# Patient Record
Sex: Female | Born: 1935
Health system: Southern US, Community
[De-identification: ages and names within clinical notes are randomized; demographics above are authoritative.]

## PROBLEM LIST (undated history)

## (undated) DIAGNOSIS — I739 Peripheral vascular disease, unspecified: Secondary | ICD-10-CM

## (undated) DIAGNOSIS — C649 Malignant neoplasm of unspecified kidney, except renal pelvis: Secondary | ICD-10-CM

## (undated) DIAGNOSIS — M1990 Unspecified osteoarthritis, unspecified site: Secondary | ICD-10-CM

## (undated) DIAGNOSIS — E119 Type 2 diabetes mellitus without complications: Secondary | ICD-10-CM

## (undated) DIAGNOSIS — I2510 Atherosclerotic heart disease of native coronary artery without angina pectoris: Secondary | ICD-10-CM

## (undated) DIAGNOSIS — F329 Major depressive disorder, single episode, unspecified: Secondary | ICD-10-CM

## (undated) DIAGNOSIS — F419 Anxiety disorder, unspecified: Secondary | ICD-10-CM

## (undated) DIAGNOSIS — I10 Essential (primary) hypertension: Secondary | ICD-10-CM

## (undated) DIAGNOSIS — R7989 Other specified abnormal findings of blood chemistry: Secondary | ICD-10-CM

## (undated) DIAGNOSIS — D649 Anemia, unspecified: Secondary | ICD-10-CM

## (undated) DIAGNOSIS — R569 Unspecified convulsions: Secondary | ICD-10-CM

## (undated) DIAGNOSIS — M542 Cervicalgia: Secondary | ICD-10-CM

## (undated) DIAGNOSIS — G43909 Migraine, unspecified, not intractable, without status migrainosus: Secondary | ICD-10-CM

## (undated) DIAGNOSIS — Z87442 Personal history of urinary calculi: Secondary | ICD-10-CM

## (undated) DIAGNOSIS — E785 Hyperlipidemia, unspecified: Secondary | ICD-10-CM

## (undated) DIAGNOSIS — I639 Cerebral infarction, unspecified: Secondary | ICD-10-CM

## (undated) DIAGNOSIS — F32A Depression, unspecified: Secondary | ICD-10-CM

## (undated) DIAGNOSIS — Z8739 Personal history of other diseases of the musculoskeletal system and connective tissue: Secondary | ICD-10-CM

## (undated) DIAGNOSIS — I779 Disorder of arteries and arterioles, unspecified: Secondary | ICD-10-CM

## (undated) DIAGNOSIS — I214 Non-ST elevation (NSTEMI) myocardial infarction: Secondary | ICD-10-CM

## (undated) HISTORY — DX: Peripheral vascular disease, unspecified: I73.9

## (undated) HISTORY — PX: LUMBAR DISC SURGERY: SHX700

## (undated) HISTORY — PX: BACK SURGERY: SHX140

## (undated) HISTORY — DX: Cervicalgia: M54.2

## (undated) HISTORY — DX: Unspecified convulsions: R56.9

## (undated) HISTORY — PX: CAROTID ENDARTERECTOMY: SUR193

## (undated) HISTORY — PX: URETERAL STENT PLACEMENT: SHX822

## (undated) HISTORY — DX: Disorder of arteries and arterioles, unspecified: I77.9

## (undated) HISTORY — PX: NEPHRECTOMY: SHX65

## (undated) HISTORY — PX: TONSILLECTOMY: SUR1361

## (undated) HISTORY — DX: Unspecified osteoarthritis, unspecified site: M19.90

## (undated) HISTORY — DX: Essential (primary) hypertension: I10

## (undated) HISTORY — DX: Cerebral infarction, unspecified: I63.9

## (undated) HISTORY — DX: Malignant neoplasm of unspecified kidney, except renal pelvis: C64.9

## (undated) HISTORY — DX: Personal history of other diseases of the musculoskeletal system and connective tissue: Z87.39

## (undated) HISTORY — DX: Other specified abnormal findings of blood chemistry: R79.89

## (undated) HISTORY — PX: CATARACT EXTRACTION W/ INTRAOCULAR LENS  IMPLANT, BILATERAL: SHX1307

## (undated) HISTORY — DX: Hyperlipidemia, unspecified: E78.5

## (undated) HISTORY — PX: ABDOMINAL HYSTERECTOMY: SHX81

---

## 1988-12-26 DIAGNOSIS — E119 Type 2 diabetes mellitus without complications: Secondary | ICD-10-CM

## 1988-12-26 HISTORY — DX: Type 2 diabetes mellitus without complications: E11.9

## 1998-06-11 ENCOUNTER — Inpatient Hospital Stay (HOSPITAL_COMMUNITY): Admission: RE | Admit: 1998-06-11 | Discharge: 1998-06-12 | Payer: Self-pay | Admitting: Specialist

## 1998-07-27 ENCOUNTER — Encounter: Admission: RE | Admit: 1998-07-27 | Discharge: 1998-10-25 | Payer: Self-pay | Admitting: Anesthesiology

## 1999-05-18 ENCOUNTER — Inpatient Hospital Stay (HOSPITAL_COMMUNITY): Admission: RE | Admit: 1999-05-18 | Discharge: 1999-05-23 | Payer: Self-pay | Admitting: Urology

## 1999-12-24 ENCOUNTER — Encounter: Admission: RE | Admit: 1999-12-24 | Discharge: 1999-12-24 | Payer: Self-pay | Admitting: Urology

## 1999-12-24 ENCOUNTER — Encounter: Payer: Self-pay | Admitting: Urology

## 1999-12-25 ENCOUNTER — Ambulatory Visit (HOSPITAL_COMMUNITY): Admission: RE | Admit: 1999-12-25 | Discharge: 1999-12-25 | Payer: Self-pay | Admitting: Internal Medicine

## 1999-12-25 ENCOUNTER — Encounter: Payer: Self-pay | Admitting: Internal Medicine

## 1999-12-27 DIAGNOSIS — C649 Malignant neoplasm of unspecified kidney, except renal pelvis: Secondary | ICD-10-CM

## 1999-12-27 HISTORY — DX: Malignant neoplasm of unspecified kidney, except renal pelvis: C64.9

## 2000-03-24 ENCOUNTER — Encounter: Admission: RE | Admit: 2000-03-24 | Discharge: 2000-03-24 | Payer: Self-pay | Admitting: Urology

## 2000-03-24 ENCOUNTER — Encounter: Payer: Self-pay | Admitting: Urology

## 2000-06-23 ENCOUNTER — Encounter: Admission: RE | Admit: 2000-06-23 | Discharge: 2000-06-23 | Payer: Self-pay | Admitting: Urology

## 2000-06-23 ENCOUNTER — Encounter: Payer: Self-pay | Admitting: Urology

## 2000-10-13 ENCOUNTER — Encounter: Admission: RE | Admit: 2000-10-13 | Discharge: 2000-10-13 | Payer: Self-pay | Admitting: Urology

## 2000-10-13 ENCOUNTER — Encounter: Payer: Self-pay | Admitting: Urology

## 2002-12-26 HISTORY — PX: OTHER SURGICAL HISTORY: SHX169

## 2003-01-17 ENCOUNTER — Encounter: Payer: Self-pay | Admitting: Urology

## 2003-01-17 ENCOUNTER — Encounter: Admission: RE | Admit: 2003-01-17 | Discharge: 2003-01-17 | Payer: Self-pay | Admitting: Urology

## 2003-03-10 ENCOUNTER — Ambulatory Visit (HOSPITAL_COMMUNITY): Admission: RE | Admit: 2003-03-10 | Discharge: 2003-03-11 | Payer: Self-pay | Admitting: Cardiovascular Disease

## 2003-10-23 ENCOUNTER — Ambulatory Visit (HOSPITAL_COMMUNITY): Admission: RE | Admit: 2003-10-23 | Discharge: 2003-10-23 | Payer: Self-pay | Admitting: Cardiovascular Disease

## 2003-11-27 ENCOUNTER — Encounter (INDEPENDENT_AMBULATORY_CARE_PROVIDER_SITE_OTHER): Payer: Self-pay | Admitting: Specialist

## 2003-11-27 ENCOUNTER — Inpatient Hospital Stay (HOSPITAL_COMMUNITY): Admission: RE | Admit: 2003-11-27 | Discharge: 2003-11-28 | Payer: Self-pay | Admitting: *Deleted

## 2004-01-23 ENCOUNTER — Ambulatory Visit (HOSPITAL_COMMUNITY): Admission: RE | Admit: 2004-01-23 | Discharge: 2004-01-23 | Payer: Self-pay | Admitting: Urology

## 2005-01-07 ENCOUNTER — Ambulatory Visit: Admission: RE | Admit: 2005-01-07 | Discharge: 2005-01-07 | Payer: Self-pay | Admitting: Urology

## 2005-04-29 ENCOUNTER — Ambulatory Visit (HOSPITAL_COMMUNITY): Admission: RE | Admit: 2005-04-29 | Discharge: 2005-04-29 | Payer: Self-pay | Admitting: Gastroenterology

## 2005-04-29 ENCOUNTER — Emergency Department (HOSPITAL_COMMUNITY): Admission: EM | Admit: 2005-04-29 | Discharge: 2005-04-29 | Payer: Self-pay | Admitting: Emergency Medicine

## 2005-04-29 ENCOUNTER — Encounter (INDEPENDENT_AMBULATORY_CARE_PROVIDER_SITE_OTHER): Payer: Self-pay | Admitting: *Deleted

## 2005-10-24 ENCOUNTER — Encounter: Admission: RE | Admit: 2005-10-24 | Discharge: 2005-10-24 | Payer: Self-pay | Admitting: Gastroenterology

## 2005-11-18 ENCOUNTER — Encounter: Admission: RE | Admit: 2005-11-18 | Discharge: 2005-11-18 | Payer: Self-pay | Admitting: Gastroenterology

## 2006-09-29 ENCOUNTER — Encounter (INDEPENDENT_AMBULATORY_CARE_PROVIDER_SITE_OTHER): Payer: Self-pay | Admitting: Specialist

## 2006-09-29 ENCOUNTER — Encounter: Admission: RE | Admit: 2006-09-29 | Discharge: 2006-09-29 | Payer: Self-pay | Admitting: Internal Medicine

## 2006-12-26 DIAGNOSIS — I639 Cerebral infarction, unspecified: Secondary | ICD-10-CM

## 2006-12-26 HISTORY — DX: Cerebral infarction, unspecified: I63.9

## 2007-03-23 ENCOUNTER — Ambulatory Visit: Payer: Self-pay | Admitting: Vascular Surgery

## 2007-03-23 ENCOUNTER — Ambulatory Visit: Payer: Self-pay | Admitting: Cardiology

## 2007-03-23 ENCOUNTER — Encounter: Payer: Self-pay | Admitting: Cardiology

## 2007-03-23 ENCOUNTER — Inpatient Hospital Stay (HOSPITAL_COMMUNITY): Admission: AD | Admit: 2007-03-23 | Discharge: 2007-03-25 | Payer: Self-pay | Admitting: Neurology

## 2007-03-23 ENCOUNTER — Encounter: Payer: Self-pay | Admitting: Vascular Surgery

## 2007-03-23 ENCOUNTER — Encounter: Payer: Self-pay | Admitting: Emergency Medicine

## 2007-04-03 ENCOUNTER — Ambulatory Visit (HOSPITAL_COMMUNITY): Admission: RE | Admit: 2007-04-03 | Discharge: 2007-04-03 | Payer: Self-pay | Admitting: Urology

## 2007-04-25 ENCOUNTER — Ambulatory Visit: Payer: Self-pay | Admitting: Thoracic Surgery

## 2007-09-19 ENCOUNTER — Ambulatory Visit: Payer: Self-pay | Admitting: Thoracic Surgery

## 2007-09-19 ENCOUNTER — Encounter: Admission: RE | Admit: 2007-09-19 | Discharge: 2007-09-19 | Payer: Self-pay | Admitting: Thoracic Surgery

## 2007-11-12 ENCOUNTER — Encounter: Admission: RE | Admit: 2007-11-12 | Discharge: 2007-11-12 | Payer: Self-pay | Admitting: Internal Medicine

## 2008-09-02 ENCOUNTER — Ambulatory Visit: Payer: Self-pay | Admitting: Thoracic Surgery

## 2008-09-02 ENCOUNTER — Encounter: Admission: RE | Admit: 2008-09-02 | Discharge: 2008-09-02 | Payer: Self-pay | Admitting: Thoracic Surgery

## 2008-11-25 ENCOUNTER — Encounter: Admission: RE | Admit: 2008-11-25 | Discharge: 2008-11-25 | Payer: Self-pay | Admitting: Internal Medicine

## 2008-12-09 ENCOUNTER — Encounter: Admission: RE | Admit: 2008-12-09 | Discharge: 2008-12-09 | Payer: Self-pay | Admitting: Internal Medicine

## 2009-03-03 ENCOUNTER — Encounter: Admission: RE | Admit: 2009-03-03 | Discharge: 2009-03-03 | Payer: Self-pay | Admitting: Thoracic Surgery

## 2009-03-03 ENCOUNTER — Ambulatory Visit: Payer: Self-pay | Admitting: Thoracic Surgery

## 2009-06-18 ENCOUNTER — Emergency Department (HOSPITAL_COMMUNITY): Admission: EM | Admit: 2009-06-18 | Discharge: 2009-06-19 | Payer: Self-pay | Admitting: Emergency Medicine

## 2009-08-14 ENCOUNTER — Encounter (HOSPITAL_COMMUNITY): Admission: RE | Admit: 2009-08-14 | Discharge: 2009-11-04 | Payer: Self-pay | Admitting: Nephrology

## 2009-08-15 ENCOUNTER — Encounter: Admission: RE | Admit: 2009-08-15 | Discharge: 2009-08-15 | Payer: Self-pay | Admitting: Family Medicine

## 2009-11-02 ENCOUNTER — Encounter (HOSPITAL_COMMUNITY): Admission: RE | Admit: 2009-11-02 | Discharge: 2009-11-03 | Payer: Self-pay | Admitting: Nephrology

## 2009-11-26 ENCOUNTER — Encounter: Admission: RE | Admit: 2009-11-26 | Discharge: 2009-11-26 | Payer: Self-pay | Admitting: *Deleted

## 2009-12-26 LAB — HM MAMMOGRAPHY

## 2010-02-23 ENCOUNTER — Ambulatory Visit: Payer: Self-pay | Admitting: Thoracic Surgery

## 2010-02-23 ENCOUNTER — Encounter: Admission: RE | Admit: 2010-02-23 | Discharge: 2010-02-23 | Payer: Self-pay | Admitting: Thoracic Surgery

## 2010-07-29 ENCOUNTER — Inpatient Hospital Stay (HOSPITAL_COMMUNITY)
Admission: EM | Admit: 2010-07-29 | Discharge: 2010-08-02 | Payer: Self-pay | Source: Home / Self Care | Admitting: Emergency Medicine

## 2010-07-30 ENCOUNTER — Ambulatory Visit: Payer: Self-pay | Admitting: Vascular Surgery

## 2010-07-30 ENCOUNTER — Encounter (INDEPENDENT_AMBULATORY_CARE_PROVIDER_SITE_OTHER): Payer: Self-pay | Admitting: Internal Medicine

## 2010-08-11 ENCOUNTER — Observation Stay (HOSPITAL_COMMUNITY): Admission: EM | Admit: 2010-08-11 | Discharge: 2010-08-11 | Payer: Self-pay | Admitting: Emergency Medicine

## 2010-08-19 ENCOUNTER — Encounter: Admission: RE | Admit: 2010-08-19 | Discharge: 2010-08-19 | Payer: Self-pay | Admitting: Emergency Medicine

## 2010-12-14 ENCOUNTER — Encounter
Admission: RE | Admit: 2010-12-14 | Discharge: 2010-12-14 | Payer: Self-pay | Source: Home / Self Care | Attending: Family Medicine | Admitting: Family Medicine

## 2011-01-16 ENCOUNTER — Encounter: Payer: Self-pay | Admitting: Thoracic Surgery

## 2011-01-16 ENCOUNTER — Encounter: Payer: Self-pay | Admitting: Internal Medicine

## 2011-03-10 LAB — DIFFERENTIAL
Basophils Absolute: 0 10*3/uL (ref 0.0–0.1)
Basophils Relative: 0 % (ref 0–1)
Eosinophils Absolute: 0 10*3/uL (ref 0.0–0.7)
Eosinophils Relative: 0 % (ref 0–5)
Lymphocytes Relative: 11 % — ABNORMAL LOW (ref 12–46)
Lymphs Abs: 0.8 10*3/uL (ref 0.7–4.0)
Monocytes Absolute: 0.3 10*3/uL (ref 0.1–1.0)
Monocytes Relative: 4 % (ref 3–12)
Neutro Abs: 6 10*3/uL (ref 1.7–7.7)
Neutrophils Relative %: 85 % — ABNORMAL HIGH (ref 43–77)

## 2011-03-10 LAB — HEPATIC FUNCTION PANEL
ALT: 10 U/L (ref 0–35)
AST: 15 U/L (ref 0–37)
Albumin: 4.2 g/dL (ref 3.5–5.2)
Alkaline Phosphatase: 61 U/L (ref 39–117)
Bilirubin, Direct: <0.1 mg/dL (ref 0.0–0.3)
Total Bilirubin: 0.5 mg/dL (ref 0.3–1.2)
Total Protein: 7.2 g/dL (ref 6.0–8.3)

## 2011-03-10 LAB — URINALYSIS, ROUTINE W REFLEX MICROSCOPIC
Bilirubin Urine: NEGATIVE
Glucose, UA: NEGATIVE mg/dL
Hgb urine dipstick: NEGATIVE
Ketones, ur: 15 mg/dL — AB
Nitrite: NEGATIVE
Protein, ur: 30 mg/dL — AB
Specific Gravity, Urine: 1.02 (ref 1.005–1.030)
Urobilinogen, UA: 0.2 mg/dL (ref 0.0–1.0)
pH: 6.5 (ref 5.0–8.0)

## 2011-03-10 LAB — POCT I-STAT, CHEM 8
BUN: 31 mg/dL — ABNORMAL HIGH (ref 6–23)
Calcium, Ion: 1.12 mmol/L (ref 1.12–1.32)
Chloride: 106 mEq/L (ref 96–112)
Creatinine, Ser: 1.6 mg/dL — ABNORMAL HIGH (ref 0.4–1.2)
Glucose, Bld: 147 mg/dL — ABNORMAL HIGH (ref 70–99)
HCT: 35 % — ABNORMAL LOW (ref 36.0–46.0)
Hemoglobin: 11.9 g/dL — ABNORMAL LOW (ref 12.0–15.0)
Potassium: 4.2 mEq/L (ref 3.5–5.1)
Sodium: 138 mEq/L (ref 135–145)
TCO2: 23 mmol/L (ref 0–100)

## 2011-03-10 LAB — POCT CARDIAC MARKERS
CKMB, poc: <1 ng/mL — ABNORMAL LOW (ref 1.0–8.0)
CKMB, poc: <1 ng/mL — ABNORMAL LOW (ref 1.0–8.0)
Myoglobin, poc: 160 ng/mL (ref 12–200)
Myoglobin, poc: 163 ng/mL (ref 12–200)
Troponin i, poc: <0.05 ng/mL (ref 0.00–0.09)
Troponin i, poc: <0.05 ng/mL (ref 0.00–0.09)

## 2011-03-10 LAB — PROTIME-INR
INR: 1.07 (ref 0.00–1.49)
Prothrombin Time: 14.1 seconds (ref 11.6–15.2)

## 2011-03-10 LAB — CBC
HCT: 32.5 % — ABNORMAL LOW (ref 36.0–46.0)
Hemoglobin: 10.6 g/dL — ABNORMAL LOW (ref 12.0–15.0)
MCH: 29.1 pg (ref 26.0–34.0)
MCHC: 32.6 g/dL (ref 30.0–36.0)
MCV: 89.3 fL (ref 78.0–100.0)
Platelets: 319 10*3/uL (ref 150–400)
RBC: 3.64 MIL/uL — ABNORMAL LOW (ref 3.87–5.11)
RDW: 17.6 % — ABNORMAL HIGH (ref 11.5–15.5)
WBC: 7.1 10*3/uL (ref 4.0–10.5)

## 2011-03-10 LAB — APTT: aPTT: 26 seconds (ref 24–37)

## 2011-03-10 LAB — URINE MICROSCOPIC-ADD ON

## 2011-03-10 LAB — D-DIMER, QUANTITATIVE: D-Dimer, Quant: 0.27 ug/mL-FEU (ref 0.00–0.48)

## 2011-03-11 LAB — CBC
HCT: 27.5 % — ABNORMAL LOW (ref 36.0–46.0)
HCT: 29 % — ABNORMAL LOW (ref 36.0–46.0)
HCT: 29.8 % — ABNORMAL LOW (ref 36.0–46.0)
Hemoglobin: 8.8 g/dL — ABNORMAL LOW (ref 12.0–15.0)
Hemoglobin: 9.2 g/dL — ABNORMAL LOW (ref 12.0–15.0)
Hemoglobin: 9.7 g/dL — ABNORMAL LOW (ref 12.0–15.0)
MCH: 27.5 pg (ref 26.0–34.0)
MCH: 27.8 pg (ref 26.0–34.0)
MCH: 28.2 pg (ref 26.0–34.0)
MCHC: 31.7 g/dL (ref 30.0–36.0)
MCHC: 32 g/dL (ref 30.0–36.0)
MCHC: 32.6 g/dL (ref 30.0–36.0)
MCV: 86.6 fL (ref 78.0–100.0)
MCV: 86.6 fL (ref 78.0–100.0)
MCV: 86.8 fL (ref 78.0–100.0)
Platelets: 262 10*3/uL (ref 150–400)
Platelets: 273 10*3/uL (ref 150–400)
Platelets: 307 10*3/uL (ref 150–400)
RBC: 3.17 MIL/uL — ABNORMAL LOW (ref 3.87–5.11)
RBC: 3.35 MIL/uL — ABNORMAL LOW (ref 3.87–5.11)
RBC: 3.44 MIL/uL — ABNORMAL LOW (ref 3.87–5.11)
RDW: 15.1 % (ref 11.5–15.5)
RDW: 15.2 % (ref 11.5–15.5)
RDW: 15.3 % (ref 11.5–15.5)
WBC: 4.8 10*3/uL (ref 4.0–10.5)
WBC: 5.1 10*3/uL (ref 4.0–10.5)
WBC: 5.8 10*3/uL (ref 4.0–10.5)

## 2011-03-11 LAB — BASIC METABOLIC PANEL
BUN: 15 mg/dL (ref 6–23)
BUN: 17 mg/dL (ref 6–23)
BUN: 19 mg/dL (ref 6–23)
CO2: 23 mEq/L (ref 19–32)
CO2: 24 mEq/L (ref 19–32)
CO2: 24 mEq/L (ref 19–32)
Calcium: 8.3 mg/dL — ABNORMAL LOW (ref 8.4–10.5)
Calcium: 8.7 mg/dL (ref 8.4–10.5)
Calcium: 9.1 mg/dL (ref 8.4–10.5)
Chloride: 107 mEq/L (ref 96–112)
Chloride: 107 mEq/L (ref 96–112)
Chloride: 109 mEq/L (ref 96–112)
Creatinine, Ser: 1.47 mg/dL — ABNORMAL HIGH (ref 0.4–1.2)
Creatinine, Ser: 1.49 mg/dL — ABNORMAL HIGH (ref 0.4–1.2)
Creatinine, Ser: 1.5 mg/dL — ABNORMAL HIGH (ref 0.4–1.2)
GFR calc Af Amer: 41 mL/min — ABNORMAL LOW (ref >60)
GFR calc Af Amer: 42 mL/min — ABNORMAL LOW (ref >60)
GFR calc Af Amer: 42 mL/min — ABNORMAL LOW (ref >60)
GFR calc non Af Amer: 34 mL/min — ABNORMAL LOW (ref >60)
GFR calc non Af Amer: 34 mL/min — ABNORMAL LOW (ref >60)
GFR calc non Af Amer: 35 mL/min — ABNORMAL LOW (ref >60)
Glucose, Bld: 119 mg/dL — ABNORMAL HIGH (ref 70–99)
Glucose, Bld: 92 mg/dL (ref 70–99)
Glucose, Bld: 98 mg/dL (ref 70–99)
Potassium: 3.8 mEq/L (ref 3.5–5.1)
Potassium: 4 mEq/L (ref 3.5–5.1)
Potassium: 4.1 mEq/L (ref 3.5–5.1)
Sodium: 139 mEq/L (ref 135–145)
Sodium: 139 mEq/L (ref 135–145)
Sodium: 141 mEq/L (ref 135–145)

## 2011-03-11 LAB — CK TOTAL AND CKMB (NOT AT ARMC)
CK, MB: 1.2 ng/mL (ref 0.3–4.0)
Relative Index: INVALID (ref 0.0–2.5)
Total CK: 53 U/L (ref 7–177)

## 2011-03-11 LAB — PROTIME-INR
INR: 1.02 (ref 0.00–1.49)
Prothrombin Time: 13.6 seconds (ref 11.6–15.2)

## 2011-03-11 LAB — URINALYSIS, ROUTINE W REFLEX MICROSCOPIC
Bilirubin Urine: NEGATIVE
Glucose, UA: NEGATIVE mg/dL
Hgb urine dipstick: NEGATIVE
Ketones, ur: NEGATIVE mg/dL
Nitrite: NEGATIVE
Protein, ur: NEGATIVE mg/dL
Specific Gravity, Urine: 1.008 (ref 1.005–1.030)
Urobilinogen, UA: 0.2 mg/dL (ref 0.0–1.0)
pH: 6.5 (ref 5.0–8.0)

## 2011-03-11 LAB — DIFFERENTIAL
Basophils Absolute: 0 10*3/uL (ref 0.0–0.1)
Basophils Relative: 1 % (ref 0–1)
Eosinophils Absolute: 0 10*3/uL (ref 0.0–0.7)
Eosinophils Relative: 1 % (ref 0–5)
Lymphocytes Relative: 21 % (ref 12–46)
Lymphs Abs: 1.2 10*3/uL (ref 0.7–4.0)
Monocytes Absolute: 0.4 10*3/uL (ref 0.1–1.0)
Monocytes Relative: 7 % (ref 3–12)
Neutro Abs: 4.1 10*3/uL (ref 1.7–7.7)
Neutrophils Relative %: 71 % (ref 43–77)

## 2011-03-11 LAB — GLUCOSE, CAPILLARY
Glucose-Capillary: 101 mg/dL — ABNORMAL HIGH (ref 70–99)
Glucose-Capillary: 114 mg/dL — ABNORMAL HIGH (ref 70–99)
Glucose-Capillary: 116 mg/dL — ABNORMAL HIGH (ref 70–99)
Glucose-Capillary: 122 mg/dL — ABNORMAL HIGH (ref 70–99)
Glucose-Capillary: 136 mg/dL — ABNORMAL HIGH (ref 70–99)
Glucose-Capillary: 368 mg/dL — ABNORMAL HIGH (ref 70–99)
Glucose-Capillary: 50 mg/dL — ABNORMAL LOW (ref 70–99)
Glucose-Capillary: 51 mg/dL — ABNORMAL LOW (ref 70–99)
Glucose-Capillary: 58 mg/dL — ABNORMAL LOW (ref 70–99)
Glucose-Capillary: 59 mg/dL — ABNORMAL LOW (ref 70–99)
Glucose-Capillary: 78 mg/dL (ref 70–99)
Glucose-Capillary: 79 mg/dL (ref 70–99)
Glucose-Capillary: 81 mg/dL (ref 70–99)
Glucose-Capillary: 82 mg/dL (ref 70–99)
Glucose-Capillary: 92 mg/dL (ref 70–99)
Glucose-Capillary: 97 mg/dL (ref 70–99)
Glucose-Capillary: 98 mg/dL (ref 70–99)

## 2011-03-11 LAB — COMPREHENSIVE METABOLIC PANEL
ALT: 10 U/L (ref 0–35)
AST: 17 U/L (ref 0–37)
Albumin: 3.9 g/dL (ref 3.5–5.2)
Alkaline Phosphatase: 58 U/L (ref 39–117)
BUN: 22 mg/dL (ref 6–23)
CO2: 23 mEq/L (ref 19–32)
Calcium: 8.9 mg/dL (ref 8.4–10.5)
Chloride: 107 mEq/L (ref 96–112)
Creatinine, Ser: 1.64 mg/dL — ABNORMAL HIGH (ref 0.4–1.2)
GFR calc Af Amer: 37 mL/min — ABNORMAL LOW (ref >60)
GFR calc non Af Amer: 31 mL/min — ABNORMAL LOW (ref >60)
Glucose, Bld: 80 mg/dL (ref 70–99)
Potassium: 3.8 mEq/L (ref 3.5–5.1)
Sodium: 139 mEq/L (ref 135–145)
Total Bilirubin: 0.3 mg/dL (ref 0.3–1.2)
Total Protein: 6.9 g/dL (ref 6.0–8.3)

## 2011-03-11 LAB — TSH: TSH: 2.348 u[IU]/mL (ref 0.350–4.500)

## 2011-03-11 LAB — FERRITIN: Ferritin: 7 ng/mL — ABNORMAL LOW (ref 10–291)

## 2011-03-11 LAB — LIPID PANEL
Cholesterol: 113 mg/dL (ref 0–200)
HDL: 40 mg/dL (ref >39)
LDL Cholesterol: 45 mg/dL (ref 0–99)
Total CHOL/HDL Ratio: 2.8 RATIO
Triglycerides: 138 mg/dL (ref <150)
VLDL: 28 mg/dL (ref 0–40)

## 2011-03-11 LAB — IRON AND TIBC
Iron: 22 ug/dL — ABNORMAL LOW (ref 42–135)
Saturation Ratios: 7 % — ABNORMAL LOW (ref 20–55)
TIBC: 330 ug/dL (ref 250–470)
UIBC: 308 ug/dL

## 2011-03-11 LAB — CORTISOL: Cortisol, Plasma: 13.4 ug/dL

## 2011-03-11 LAB — VITAMIN B12: Vitamin B-12: 272 pg/mL (ref 211–911)

## 2011-03-11 LAB — HEMOGLOBIN A1C
Hgb A1c MFr Bld: 6.1 % — ABNORMAL HIGH (ref <5.7)
Mean Plasma Glucose: 128 mg/dL — ABNORMAL HIGH (ref <117)

## 2011-03-11 LAB — RETICULOCYTES
RBC.: 3.15 MIL/uL — ABNORMAL LOW (ref 3.87–5.11)
Retic Count, Absolute: 37.8 10*3/uL (ref 19.0–186.0)
Retic Ct Pct: 1.2 % (ref 0.4–3.1)

## 2011-03-11 LAB — FOLATE: Folate: 10.4 ng/mL

## 2011-03-11 LAB — TROPONIN I: Troponin I: 0.04 ng/mL (ref 0.00–0.06)

## 2011-03-11 LAB — APTT: aPTT: 28 seconds (ref 24–37)

## 2011-05-10 NOTE — Letter (Signed)
March 03, 2009   [name]. [name], MD  [facility] [address], [place], [place] [address]   Re:  PENNEE, MCILWAIN              DOB:  Sep 06, 1935   Dear Richardson Landry:   I saw the patient back for followup.  Her CT scan, the right lower lobe  bulla is unchanged and the 2 pulmonary nodules are also unchanged.  We  have been following this for almost 2 years with no change.  We will  plan to see her again in 1 year with a CT scan for a final check, but  these are probably benign nodules.  I appreciate the opportunity of  seeing the patient.   Sincerely,   Nicanor Alcon, M.D.  Electronically Signed   DPB/MEDQ  D:  03/03/2009  T:  03/03/2009  Job:  [id number]

## 2011-05-10 NOTE — Letter (Signed)
February 23, 2010   [name]. [name], MD  [facility] [address], [place], [place] [address]   Re:  FEDERICA, MICHELLI              DOB:  1935/12/17   Dear Dr. Diona Fanti:   The patient comes today for final follow up.  Her CT scan shows the 2  nodules in the right lower lobe are the same and the areas of bullae are  unchanged also.  This is all goes along with compatible processes.  This  is over 3 years since we last looked at it.  I will be happy to refer  her back to you for long-term follow up and will be happy to see her  again if she has any future problems.  I appreciate the opportunity of  seeing the patient.   Sincerely,   Nicanor Alcon, M.D.  Electronically Signed   DPB/MEDQ  D:  02/23/2010  T:  02/24/2010  Job:  [id number]

## 2011-05-10 NOTE — Letter (Signed)
September 19, 2007   [name]. [name], M.D.  [facility]. [address], [place], [place]  [address]   Re:  YURIDIA, UPP              DOB:  04-14-1935   Dear Richardson Landry:   I saw this patient back for follow up of her CT scan of this right lower  lobe questionable nodular aspergilloma.  The CT scan done today still  shows the right lower bullae with questionable 1 or 2 lesions within the  bullae, which would probably go along with the aspergillosis.  Since I  do not have the old CT scan to compare I will have to get that to make  sure there has not been any change.  I discussed this with the patient.  If the old CT scan is unchanged from the final reading then I will just  see her back again in 3 months with another CT scan.  If there is some  change then we will give her a call and let you know.   Her lungs are clear to auscultation and percussion.  Her blood pressure  is 130/55, pulse 64, respirations 18, sats 99%.   Nicanor Alcon, M.D.  Electronically Signed   DPB/MEDQ  D:  09/19/2007  T:  09/20/2007  Job:  [id number]

## 2011-05-10 NOTE — Assessment & Plan Note (Signed)
OFFICE VISIT   Gabrielle [name], Gabrielle [name]  DOB:  1935-10-17                                        September 02, 2008  CHART #:  [id number]   HISTORY:  This patient has now been followed approximately 1-1/2 years  in our office after a CT scan was obtained in followup for her renal  cancer.  The right lower lobe shows areas of questionable nodular  aspergilloma.  These findings are in conjunction with bolus disease.  The patient continues to be asymptomatic in this regard.  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   CT scan was obtained on today's date, which shows the previous findings  to be unchanged.  There are no new significant findings.   PHYSICAL EXAMINATION:  VITAL SIGNS:  Blood pressure 150/60, pulse 65,  respirations 18, and oxygen saturations 99% on room air.  GENERAL:  She is alert white female in no acute distress.  PULMONARY:  Clear breath sounds.  CARDIAC:  Regular rate and rhythm.   ASSESSMENT:  The patient remains asymptomatic in relation to this chest  CT scan finding.  Dr. Arlyce Dice evaluated the patient on today's date and  feels that a CT scan should be obtained in 6 months in followup.  If  this has no new findings, than follow up can be continued on a yearly  basis and p.r.n. depending on any changes or symptomatology.   John Giovanni, P.A.-C.   Loren Racer  D:  09/02/2008  T:  09/03/2008  Job:  [id number]   cc:   Nicanor Alcon, M.D.  [name]. [name], M.D.

## 2011-05-13 NOTE — H&P (Signed)
NAME:  Gabrielle [name] NO.:  [id number]   MEDICAL RECORD NO.:  [id number]          PATIENT TYPE:  INP   LOCATION:  3014                         FACILITY:  [facility]   PHYSICIAN:  Princess Bruins. Hickling, M.D.DATE OF BIRTH:  02-Apr-1935   DATE OF ADMISSION:  03/23/2007  DATE OF DISCHARGE:                              HISTORY & PHYSICAL   CHIEF COMPLAINTS:  Dizziness and confusion.   HISTORY OF THE PRESENT CONDITION:  Gabrielle [name] is a 76 year old  married woman who came in with a chief complaint of weakness.  The  patient got up very early Tuesday morning, around four to go to the  bathroom, on her way back, she collapsed and fell the floor.  She lost  consciousness for about 10 minutes without any change in color or  diaphoresis.  She did not have any pain.  She did not seek care at that  time.  She felt that generally weak and unsteady and complained of  dizziness by which she meant disequilibrium.   Symptoms were moderate to severe and have been aggravated by standing or  bending over.  The symptoms worsened today, although she did not have  any syncope.  Her husband encouraged her to come to the hospital and to  become evaluated.   PAST MEDICAL HISTORY:  1. The patient has very significant vascular disease.  She has had      peripheral vascular disease, renovascular disease (and has a      stent), and cerebrovascular disease with a left carotid      endarterectomy.  2. In addition, the patient has risk factors for stroke including:      a.     Hypertension.      b.     Dyslipidemia.      c.     Non-insulin dependent, type 2, diabetes mellitus.      d.     Significant tobacco abuse.   PAST SURGICAL HISTORY:  1. Lumbar laminectomy.  2. Left carotid endarterectomy.  3. Hysterectomy.  4. Removal of right kidney for cancer.  5. Stent in the left kidney.  6. The patient has had evaluation of her legs and had some problems      with claudication, but no surgery  was done for reasons that are      unclear.   OTHER MEDICAL PROBLEMS:  1. Gout.  2. Postmenopausal state.   REVIEW OF SYSTEMS:  Negative except as noted above.  She has had a  normal appetite.  She does not ever sleep well for very long.  HEAD/NECK:  No otitis media, pharyngitis, rhinorrhea.  CARDIOVASCULAR:  No chest pain, dyspnea.  RESPIRATORY:  No cough, asthma, bronchitis,  pneumonia.  GASTROINTESTINAL:  No nausea, vomiting, diarrhea.  MUSCULOSKELETAL:  No back pain.  SKIN:  No pruritus, rash, or abrasions.  NEURO/PSYCHIATRIC:  No anxiety, depression.  NEUROLOGIC:  As noted  above.  ALLERGY/IMMUNOLOGY:  Negative.   CURRENT MEDICATIONS:  1. Allopurinol 300 mg daily.  2. Diltiazem 120 mg, three times daily.  3. Glipizide 10 mg daily.  4. Hydrochlorothiazide 25 mg  daily.  5. Lisinopril 40 mg daily.  6. Metformin 500 mg daily.  7. Pravastatin 40 mg daily.   DRUG ALLERGIES:  CODEINE which makes her sick.  She says that many pain  medicines are not tolerable to her.  With codeine she felt as if she was  going to have a heart attack.   SOCIAL HISTORY:  The patient has smoked since she was young and averages  about a pack of cigarettes per day.  At least 50-pack year history,  probably more.  She does not drink alcohol.  She does not use  recreational drugs.   PHYSICAL EXAMINATION:  GENERAL:  This is a well-developed, well-  nourished woman in no acute distress.  VITAL SIGNS:  Blood pressure 179/69, resting pulse 84, respirations 16,  oxygen saturation 98%, temperature 98.4.  EARS/NOSE/THROAT:  No infections.  No bruits.  LUNGS:  Clear.  HEART:  No murmurs.  Pulses normal.  ABDOMEN:  Soft.  Bowel sounds normal.  No hepatosplenomegaly.  EXTREMITIES:  Normal.  I felt normal pulses.  NEUROLOGIC:  The patient is awake and alert.  She seems a little  forgetful with historical data, such as her drugs but is fully oriented.  No dysphasia.  NIH stroke scale score was zero.  Modified  Rankin is  zero.  Cranial Nerves: Round reactive pupils.  Visual fields full to  double simultaneous stimuli.  Extraocular movements full.  Symmetric  facial strength.  Midline tongue and uvula.  Air conduction greater than  bone conduction bilaterally.  Motor examination normal strength, tone,  and mass.  Good fine motor movements.  No drift.  Sensation:  No  peripheral polyneuropathy which is surprising.  Good stereognosis.  Cerebellar:  Good finger-to-nose, rapid repetitive movements.  Gait:  Unsteady.  She feels disequilibrium when she is on her feet.  Deep  tendon reflexes are brisk.  Toes are bilaterally flexor.   IMPRESSION:  The patient has two sub-centimeter lesions left parietal  and left mesial frontal which appeared to be embolic versus watershed,  acute infarcts. (434.11) These are non-hemorrhagic.  The patient has  rather severe white gray matter disease in the diencephalon and internal  capsules bilaterally.  She has a significant vascular past which has  been described above and has multiple risk factors for stroke.  The  onset of her stroke is unclear.  Her NIH stroke scale of zero;  therefore, she is not a candidate for TPA or for research protocol.   We will admit her to workup the etiology of her stroke and also to  provide secondary stroke prevention.  She is not on any antiplatelet  medicines.  We will start her on Aggrenox.  She has not yet had a  swallowing screen.  This will be done when she arrives at [facility].  She is NPO until the swallowing screen is complete.  We will  treat her with sliding scale insulin.  Her sugars at this point are 234.  Her blood pressure has gone up since she was admitted to the hospital  from the 130s to the 170s.  We will not aggressively treat that at this  time, other than with  her regular medications.  She has had a carotid Doppler which shows no internal carotid artery stenosis bilaterally.  MRI scan as noted.   MRA  will be done tomorrow.  A 2-D echocardiogram is complete and pending.  She will also have workup for treatable causes stroke tomorrow.  Princess Bruins. Gaynell Face, M.D.  Electronically Signed     WHH/MEDQ  D:  03/23/2007  T:  03/23/2007  Job:  [id number]   cc:   Gwenlyn Perking, M.D.

## 2011-05-13 NOTE — Op Note (Signed)
NAME:  Gabrielle [name], Gabrielle [name]                        ACCOUNT NO.:  [id number]   MEDICAL RECORD NO.:  [id number]                   PATIENT TYPE:  INP   LOCATION:  3301                                 FACILITY:  [facility]   PHYSICIAN:  Dorothea Glassman, M.D.                 DATE OF BIRTH:  [birth year]   DATE OF PROCEDURE:  11/27/2003  DATE OF DISCHARGE:  11/28/2003                                 OPERATIVE REPORT   PREOPERATIVE DIAGNOSIS:  Severe left internal carotid artery stenosis.   POSTOPERATIVE DIAGNOSIS:  Severe left internal carotid artery stenosis.   OPERATION PERFORMED:  Left carotid endarterectomy with Dacron patch  angioplasty.   SURGEON:  Dorothea Glassman, M.D.   ASSISTANT:  Suzzanne Cloud, P.A.   ANESTHESIA:  General endotracheal.   ANESTHESIOLOGIST:  Jessy Oto. Albertina Parr, M.D.   INDICATIONS FOR PROCEDURE:  Gabrielle [name] is a 76 year old female with  advanced vascular atherosclerotic disease.  She has a history of coronary  artery disease, renal vascular disease and peripheral vascular disease.  On  recent evaluation, she was noted to have a carotid bruit, and underwent  carotid Doppler evaluation revealing severe left internal carotid artery  stenosis.  Arteriography verified these findings.  She was referred to the  office for an opinion.  It was recommended she undergo left carotid  endarterectomy for reduction of stroke risk.  She consented for this  procedure.  Brought to the operating room at this time.  The risks of this  operative procedure including major complications rate of 1 to 2% including  MI, CVA, cranial nerve injury and death.   DESCRIPTION OF PROCEDURE:  The patient was brought to the operating room in  stable condition.  Placed in supine position.  General endotracheal  anesthesia induced.  Left neck prepped and draped in the sterile fashion.  Foley catheter and arterial line in place.  Curvilinear skin incision made  along the anterior border of the left  sternomastoid muscle.  Subcutaneous  tissue and platysma divided with electrocautery.  Deep dissection carried  down to the carotid sheath.  The facial vein ligated with 2-0 silk and  divided.  The carotid bifurcation exposed.  The common carotid artery  mobilized down to the omohyoid muscle and encircled with vessel loop.  The  origin of the superior thyroid and external carotid artery were encircled  with vessel loops.  The internal carotid artery was followed distally up to  the posterior belly of the digastric muscle.  At this point internal carotid  artery encircled with a vessel loop.  The vagus nerve and hypoglossal nerve  were both preserved.  The patient administered 7000 units of heparin  intravenously.  Adequate circulation time permitted. The carotid vessels  controlled with clamps.  Longitudinal arteriotomy made in the distal common  carotid artery.  The arteriotomy extended across the carotid bulb and up  into the internal carotid artery.  There was a high grade stenosis at the  origin of the left internal carotid artery greater than 80%.  Shunt was  inserted.  An endarterectomy elevator was then used to remove the plaque.  The endarterectomy carried down into the common carotid artery where the  plaque was divided transversely with Potts scissors.  The plaque then raised  up into the bulb with the superior thyroid and external carotid were  endarterectomized using an eversion technique.  The plaque then raised in  the internal carotid artery distally and feathered out.  Fragments of plaque  removed with plaque forceps.  Site irrigated with heparin saline solution.  A Hemashield Finesse patch was then placed over the endarterectomy site with  running 6-0 Prolene suture.  At completion of the patch angioplasty, the  shunt was removed.  All vessels were well flushed.  Clamps were then removed  directing initial antegrade flow up the external carotid artery, following  this the  external carotid artery was released.  There was an excellent pulse  and Doppler signal in the distal internal carotid artery.  The patient  administered 50 mg protamine intravenously.  Adequate circulation time  permitted.  Adequate hemostasis obtained.  Sponge and instrument counts were  correct.  The sternomastoid fascia then closed with running 2-0 Vicryl  suture.  The platysma closed with running 3-0 Vicryl suture.  Skin closed  with 4-0 Monocryl.  Steri-Strips applied.  The patient tolerated the  procedure well.  Transferred to the recovery room in stable condition.  There were no apparent complications.                                               Dorothea Glassman, M.D.    PGH/MEDQ  D:  12/10/2003  T:  12/11/2003  Job:  [id number]   cc:   Quay Burow, M.D.  Fax: [phone]

## 2011-05-13 NOTE — Cardiovascular Report (Signed)
NAME:  Gabrielle [name], WARNING NO.:  [id number]   MEDICAL RECORD NO.:  [id number]                   PATIENT TYPE:  OIB   LOCATION:  2896                                 FACILITY:  [facility]   PHYSICIAN:  Quay Burow, M.D.                DATE OF BIRTH:  15-Jun-1935   DATE OF PROCEDURE:  10/23/2003  DATE OF DISCHARGE:                              CARDIAC CATHETERIZATION   [name] is a 76 year old Caucasian female with a history of PVOD.  She  has bilaterally occluded SFAs, solitary left kidney status post left renal  artery PTA and stenting.  Other problems include hypertension,  hyperlipidemia and non-insulin requiring diabetes.  Screening carotid  Dopplers because of an auscultated bruit revealed high grade left internal  carotid artery stenosis.  She is clinically asymptomatic.  She presents now  for cerebral angiography.   PROCEDURE DESCRIPTION:  The patient was brought to the sixth floor [facility] in the postabsorptive  state.  She was premedicated with p.o. Valium.  Her right groin was prepped  and shaved in the usual sterile fashion.  1% Xylocaine was used for local  anesthesia.  A 5 French sheath was inserted into the right femoral artery  using standard Seldinger technique. A 5 French right Judkins catheter was  used for subselective right and left vertebral angiography, selective right  and left carotid angiography, selective left renal angiography.  Pigtail  catheter was used for arch angiography.  Visipaque dye was used for the  entirety of the case.  Retrograde aortic pressure was monitored in the case.   ANGIOGRAPHIC RESULTS:  1. Arch aortogram:  Performed in the LAO view using 20 mL of Visipaque dye     at 20 mL per  second using digital subtraction.  This was a type 1 arch     and all vessels were intact.  2. Right vertebral:  Subselective visualized with both intra and     extracranially.  It  was nondominant with 80% ostial stenosis.  It did not     fill the basilar artery.  3. Left vertebral artery:  This is a dominant vessel and was subselectively     visualized.  It filled basilar artery and both posterior cerebral     arteries.  4. Right carotid artery:  Less than 50% right ICA stenosis.  This was     nondominant and did not fill the anterior circulation.  5. Left carotid artery:  There is an 80-90% hypodense ulcerated left     internal carotid artery stenosis involving its proximal segment. This was     a dominant vessel and filled via anterior circulation.   IMPRESSION:  [name] has 80% ostial right vertebral artery stenosis in a  nondominant vertebral as well as an 80-90% ulcerated proximal left internal  carotid artery stenosis.  She is asymptomatic.  She is  a good candidate for  left carotid endarterectomy and unfortunately does not fulfill criteria for  high risk subset to perform endovascular repair.  I am going to refer her to  Dr. Drucie Opitz for surgical consideration.   Dr. Christy Sartorius was present and assisted during the case.   The patient received intravenous Labetalol for hypertension.  The sheaths  were removed and pressure was held to the groin to achieve hemostasis.  The  patient left the lab in stable condition.  She will be discharged home as an  outpatient and will see me back in the office in approximately one to two  weeks in followup.                                                  Quay Burow, M.D.    Gabrielle [name]  D:  10/23/2003  T:  10/23/2003  Job:  [id number]   cc:   6th floor [facility] [address]   [facility]

## 2011-05-13 NOTE — Discharge Summary (Signed)
NAME:  Gabrielle [name], Gabrielle [name] NO.:  [id number]   MEDICAL RECORD NO.:  [id number]                   PATIENT TYPE:  OIB   LOCATION:  [id number]                                 FACILITY:  [facility]   PHYSICIAN:  Quay Burow, M.D.                DATE OF BIRTH:  08/29/1935   DATE OF ADMISSION:  03/10/2003  DATE OF DISCHARGE:  03/11/2003                                 DISCHARGE SUMMARY   DISCHARGE DIAGNOSES:  1. Hypertension, suboptimal control.  2. Peripheral vascular disease with renal artery stenosis by catheterization     this admission treated with percutaneous transluminal coronary     angioplasty and stenting.  3. Lower extremity peripheral vascular disease with total right superficial     femoral artery with three-vessel runoff; sixty percent left common iliac     artery.  4. Non-insulin-dependent diabetes.  5. History of hypernephroma status post right nephrectomy in May, 2000.  6. Negative Cardiolite study.   HOSPITAL COURSE:  The patient is a 76 year old female referred to [place] by Dr.  Thea Silversmith.  She saw Dr. Gwenlyn Found 02/12/03.  She has had symptoms consistent with  claudication.  ABI's were 0.45 on the right, 0.56 on the left.  She also has  hypertension, family history of coronary disease, hyperlipidemia, and non-  insulin dependent diabetes and continues to smoke.  She was set up for a  Cardiolite study and renal artery Doppler.  Renal artery Doppler suggested  single renal artery stenosis and she was set up for an angiogram.  This was  03/10/03.  This revealed an 80% left renal artery stenosis.  This was dilated  and stented by Dr. Gwenlyn Found.  She has had a previous nephrectomy on the right.  She had a 60% left common iliac artery narrowing and a total right SFA with  three-vessel runoff.  The plan is for Pletal for her lower extremity  disease.  We feel she can be discharged 03/11/03.   DISCHARGE MEDICATIONS:  Glipizide 10 mg daily, Metformin 500 mg daily to  start in two days, Atenolol 50 mg daily, hydrochlorothiazide 25 mg daily,  amitriptyline 75 mg h.s., Lisinopril 20 mg daily, aspirin 81 mg daily.   LABORATORY DATA:  Labs in the office - urinalysis was unremarkable.  Sodium  140, potassium 3.7, BUN 14, creatinine 1.4, LDL was 130, HDL 39.  Hemoglobin  A1C 7.7.  White count 6.3 thousand, hemoglobin 15.5, hematocrit 45.3,  platelets 312,000.  EKG shows sinus rhythm without acute changes.    DISPOSITION:  The patient is discharged in stable condition.  She will have  follow up renal artery Doppler 03/27/03 at 3 p.m. and see Dr. Gwenlyn Found on 04/18/03  at 4 p.m.  We also added Pletal 50 mg b.i.d. at discharge.  She has been  instructed to discontinue smoking.      Erlene Quan, P.A.  Quay Burow, M.D.    Meryl Dare  D:  03/11/2003  T:  03/12/2003  Job:  [id number]   cc:   Gwenlyn Perking, M.D.  [address]  [place]  [place] [address]  Fax: [phone]

## 2011-05-13 NOTE — H&P (Signed)
NAME:  Gabrielle [name], Gabrielle [name]                        ACCOUNT NO.:  [id number]   MEDICAL RECORD NO.:  [id number]                   PATIENT TYPE:  INP   LOCATION:  NA                                   FACILITY:  [facility]   PHYSICIAN:  Dorothea Glassman, M.D.                 DATE OF BIRTH:  1935/11/06   DATE OF ADMISSION:  DATE OF DISCHARGE:                                HISTORY & PHYSICAL   DATE OF ADMISSION:  November 27, 2003   CHIEF COMPLAINT:  Blocked carotid artery.   HISTORY OF PRESENT ILLNESS:  The patient is a 76 year old white female who  has a long history of peripheral vascular disease followed by Dr. Quay Burow.  She was recently seen by Dr. Gwenlyn Found for follow-up duplex scan of her  lower extremities for evaluation of her peripheral vascular disease.  At the  time she also underwent carotid Doppler studies which showed high-grade left  internal carotid artery stenosis.  She has really had no symptoms such as  visual changes, amaurosis fugax, dysphagia, weakness, syncope, or speech  impairment.  However, because of the high-grade stenosis and her previous  history of peripheral vascular disease Dr. Gwenlyn Found performed a cerebral  angiogram to further delineate her anatomy on October 23, 2003.  She was  noted to have an 80% left internal carotid artery stenosis which appeared  hypodense and ulcerated, along with a 50% right internal carotid artery  stenosis and an 80% ostial nondominant right vertebral artery stenosis.  She  was not felt to be an appropriate candidate for carotid stenting and was  subsequently referred to Dr. Drucie Opitz for surgical evaluation.  It was Dr.  Doristine Locks opinion that she should undergo left carotid endarterectomy  electively at this time to decrease the risk of stroke in the future.   PAST MEDICAL HISTORY:  1. Bilateral carotid stenoses, left greater than right.  2. Peripheral vascular disease with bilateral superficial femoral artery     occlusions and  moderate bilateral iliac occlusive disease.  3. Left renal artery stenosis status post PTCA and stenting.  4. Hypertension.  5. Hyperlipidemia.  6. Type 2 non-insulin-dependent diabetes mellitus.  7. Renal carcinoma status post nephrectomy with no further treatment     indicated.  8. Degenerative joint disease of the back and neck with chronic pain.  9. Fibromyalgia.  10.      History of rheumatic fever as a child.   PAST SURGICAL HISTORY:  1. Back surgery to repair ruptured disc in June 1999.  2. Right nephrectomy in May 2000.   CURRENT MEDICATIONS:  1. Glipizide 10 mg daily.  2. Metformin 500 mg daily.  3. Lisinopril 40 mg daily.  4. Atenolol 50 mg daily.  5. Hydrochlorothiazide 25 mg daily.  6. Amitriptyline 75 mg q.h.s.  7. Lipitor 10 mg daily.  8. Enteric-coated aspirin 325 mg daily.   ALLERGIES:  No known  drug allergies.  She reports that she has taken both  Pletal and Plavix in the past and both have caused orthostatic-sounding  dizziness and lead to falling.  She also has intolerance to most PAIN  MEDICATION, particularly to MORPHINE and CODEINE which cause chest  discomfort, nausea, and itching.   FAMILY HISTORY:  Her mother died at age 25 of bladder cancer.  She also was  hypertensive.  Her father died at age 55 of coronary artery disease.  No  other family history of CVA, diabetes mellitus, COPD, or hyperlipidemia.   SOCIAL HISTORY:  She is married and resides in [place] with her husband.  She has two children.  She is a retired Sales promotion account executive.  She previously  smoked one to one-and-a-half packs of cigarettes per day and smoked for 40+  years.  She quit approximately five months ago.  She denies alcohol use.   REVIEW OF SYSTEMS:  See history of present illness for pertinent positives  and negatives.  Also, she has bilateral lower extremity claudication  symptoms including calf pain, hip pain, and buttock pain which generally  occur with exertion and are  relieved by rest.  She had negative Cardiolite  study in March 2004.  She recently finished physical therapy for her chronic  back and neck problems.  She wears reading glasses.  She reports some recent  hair loss.  She denies fevers, chills, recent infections, weight loss,  neurological symptoms, chest pain, palpitations, shortness of breath,  dyspnea on exertion, cough, orthopnea, abdominal pain, nausea, vomiting,  diarrhea, constipation, reflux symptoms, dysuria, hematuria, nocturia,  hematochezia, melena, lower extremity edema, anxiety/depression, intolerance  to heat or cold.   PHYSICAL EXAMINATION:  VITAL SIGNS:  Blood pressure is 136/82, pulse is 80  and regular, respirations 18 and unlabored.  GENERAL:  This is a well-developed, well-nourished white female in no acute  distress.  HEENT:  Normocephalic, atraumatic.  Pupils equal, round, and reactive to  light and accommodation.  Extraocular movements intact.  TMs and canals are  clear.  Nares patent bilaterally.  Oropharynx is clear with moist mucous  membranes.  NECK:  Supple without lymphadenopathy or thyromegaly.  She has a soft right  carotid bruit and a harsh left carotid bruit.  HEART:  Regular rate and rhythm without murmurs, rubs, or gallops.  LUNGS:  Clear to auscultation.  ABDOMEN:  Soft, nontender, nondistended with active bowel sounds in all  quadrants.  No masses or hepatosplenomegaly.  EXTREMITIES:  No clubbing, cyanosis, or edema.  NEUROLOGIC:  Cranial nerves II-XII grossly intact.  Her gait is within  normal limits and is only limited secondary to claudication after ambulating  for a long distance.  Motor and sensory are intact.  Cerebellar function is  intact.   ASSESSMENT AND PLAN:  This is a 76 year old white female with a long history  of peripheral vascular disease who presents with asymptomatic left internal  carotid stenosis.  She will be admitted to [facility] on November 27, 2003 by Dr. Drucie Opitz and undergo a left carotid endarterectomy at that  time.      Suzzanne Cloud, P.A.                        Dorothea Glassman, M.D.    GC/MEDQ  D:  11/25/2003  T:  11/25/2003  Job:  [id number]   cc:   Quay Burow, M.D.  Fax: [phone]   Gwenlyn Perking, M.D.  [phone]  [place]  [place] [address]  Fax: [phone]

## 2011-05-13 NOTE — Discharge Summary (Signed)
NAME:  Gabrielle [name], Gabrielle [name]                        ACCOUNT NO.:  [id number]   MEDICAL RECORD NO.:  [id number]                   PATIENT TYPE:  INP   LOCATION:  3301                                 FACILITY:  [facility]   PHYSICIAN:  Dorothea Glassman, M.D.                 DATE OF BIRTH:  12-22-1935   DATE OF ADMISSION:  11/27/2003  DATE OF DISCHARGE:  11/28/2003                                 DISCHARGE SUMMARY   ADMISSION DIAGNOSIS:  Left internal carotid artery stenosis, asymptomatic.   PAST MEDICAL HISTORY:  Significant for:  1. Peripheral vascular disease with moderate bilateral iliac disease, as     well as bilateral SFA occlusion.  2. Carotid artery disease.  3. Left RAS, status post PTA/stenting.  4. Hypertension.  5. Hyperlipidemia.  6. Type 2 noninsulin-dependent diabetes mellitus.  7. Renal carcinoma, status post nephrectomy.  8. DJD of the back and neck.  9. Fibromyalgia.  10.      History of rheumatic fever as a child.   PAST SURGICAL HISTORY:  1. Nephrectomy in May of 2000.  2. Back surgery in 1999 for a disk rupture.   ALLERGIES:  PLETAL which causes dizziness and PLAVIX which also causes  dizziness.  The patient does not tolerate most pain medications secondary to  chest pain, nausea, and itching.   DISCHARGE DIAGNOSIS:  Left internal carotid artery stenosis, status post  left carotid endarterectomy.   BRIEF HISTORY:  This is a 76 year old white female referred to Dr. Amedeo Plenty by  Dr. Gwenlyn Found for evaluation of carotid artery disease.  The patient recently  underwent a duplex scan with a history of peripheral vascular disease and  was found to have a high-grade left carotid artery stenosis.  Angiogram was  performed Dr. Gwenlyn Found on October 23, 2003, which revealed an 80% left internal  carotid artery stenosis which appeared hypodense and ulcerated, as well as a  50% right internal carotid artery stenosis and 80% ostial nondominant right  vertebral artery stenosis.  The patient  was seen and evaluated by Dr. Amedeo Plenty,  who recommended a left carotid endarterectomy due to increased risk of  stroke.   HOSPITAL COURSE:  The patient was admitted and taken to the OR on November 27, 2003, for a left carotid endarterectomy with Dacron patch angioplasty.  The patient tolerated the procedure well.  The patient was stable  immediately postoperatively and was extubated without problem.  The patient  woke up from anesthesia neurologically intact.  On postoperative day #1, on  the day of discharge, the patient was without complaint.  The patient was  afebrile and vital signs were stable with a blood pressure of 120/45, heart  rate 95, and O2 saturation 98% on room air.  On physical exam, cardiac was  regular rate and rhythm.  The lungs had decreased breath sounds at bilateral  bases.  The abdomen was soft, nontender, and nondistended with  positive  bowel sounds.  The incision was healing well with no scotoma.  The patient  was neurologically intact.  The patient was felt to be stable for discharge  at that time.  She was able to eat and ambulate without problems.   LABORATORY DATA:  The CBC on November 28, 2003, showed a white count of 11.3,  hemoglobin 12.1, hematocrit 34.6, and platelets 170.  The BMP on __________  showed sodium 135, potassium 3.5, BUN 19, creatinine 1.5, and glucose 220.   CONDITION ON DISCHARGE:  Improved.   DISCHARGE MEDICATIONS:  The patient is to her home resume.  The patient was  given a prescription for Ultram 50 mg to be taken one to two p.o. q.4h.  p.r.n. pain.   ACTIVITY:  No driving, heavy lifting, or strenuous activity.   DIET:  Low fat, low salt, low cholesterol.   WOUND CARE:  The patient should shower daily and clean the incision with  soap and water.  If the incisions become red, swollen, or painful or have a  drainage or if she runs a fever of 101 degrees Fahrenheit, she is to call  the CVTS office.   FOLLOWUP:  The patient has a  followup appointment with Dr. Amedeo Plenty on Monday,  December 15, 2003, at 9 a.m.      Leta Baptist, [place]                      Dorothea Glassman, M.D.    AY/MEDQ  D:  11/28/2003  T:  11/29/2003  Job:  [id number]   cc:   Quay Burow, M.D.  Fax: [phone]

## 2011-05-13 NOTE — Cardiovascular Report (Signed)
NAME:  Gabrielle [name], Gabrielle [name] NO.:  [id number]   MEDICAL RECORD NO.:  [id number]                   PATIENT TYPE:  OIB   LOCATION:  2858                                 FACILITY:  [facility]   PHYSICIAN:  Quay Burow, M.D.                DATE OF BIRTH:  01-20-1935   DATE OF PROCEDURE:  03/10/2003  DATE OF DISCHARGE:                              CARDIAC CATHETERIZATION   PROCEDURE:  1. Abdominal aortogram.  2. Selective left renal artery angiogram.  3. Bifemoral runoff.   INDICATIONS:  The patient is a 76 year old married white female, mother of  two, grandmother to 60,  who is referred for evaluation of claudication.  She, in addition, had a history of hypertension, diabetes and tobacco.  She  had a negative Cardiolite, February 26, 2003, abnormal lower extremity Dopplers  and a renal Doppler study suggesting high-grade unilateral left renal artery  stenosis.  She is status post right nephrectomy for epinephroma.  She has  moderate left internal carotid artery stenosis by Duplex ultrasound and  normal renal function.  She presents now for angiography and potential  intervention.   PROCEDURE DESCRIPTION:  Patient was brought to the sixth floor [facility] Suite in the post-absorptive state.  She  was premedicated with p.o. Valium and IV Versed.  Her right groin was  prepped and draped in the usual sterile fashion.  Xylocaine 1% was used for  local anesthetic.  A 6-French sheath was inserted into the right femoral  artery using standard Seldinger technique.  The 6-French pigtail catheter  along with a short IMA were used for midstream and distal abdominal  aortography, bifemoral runoff and selective left renal artery angiography.  Visipaque was used for the entirety of the case.  Retrograde aortic pressure  was monitored during the case.  The pullback pressure was obtained across  the left renal artery which measured 60 mmHg  gradient.   ANGIOGRAPHIC RESULTS:  1. Abdominal aorta; renal arteries:     a. Occluded right renal artery, 80% proximal left renal artery stenosis.     b. Infrarenal abdominal aorta; normal.  2. Left lower extremity:     a. Fifty to 60% ostial left common iliac artery stenosis.     b. Occluded left superficial femoral artery with three-vessel runoff.        There was reconstitution of the above-knee popliteal by collaterals.  3. Right lower extremity:     a. Fifty percent ostial right coronary artery stenosis.     b. Totalled right superficial femoral artery with three-vessel runoff.   PROCEDURE DESCRIPTION:  The patient received 3500 units of heparin  intravenously.  Using a 6-French short IMA guide catheter along with a OM-4  190 stabilizer wire and a 4 x 2 Aviator balloon, PTA was performed.  A 6 x  12 Genesis Aviator balloon/stent combination was then deployed at 8 to  10  atmospheres and post-dilated later at the ostium at 8 atmospheres, resulting  in reduction of the 80% proximal/ostial left renal artery stenosis to 0%  residual.  The patient tolerated the procedure well.  She did have transient  left flank pain which resolved with balloon deflation.   OVERALL IMPRESSION AND PLAN:  Successful left renal artery percutaneous  transluminal angioplasty and stenting for high-grade unilateral renal artery  stenosis in the setting of a solitary kidney for renovascular hypertension  and renal preservation.  The patient has severe infrainguinal disease which  will only respond to femoropopliteal bypass grafting.  I am going to start  her on a trial of Pletal and will discharge home in the morning.  The  patient should have followup renal Doppler studies afterwards and she will  see me back in the office for followup.  Dr. Gwenlyn Perking was notified of  these results.                                               Quay Burow, M.D.    Gabrielle [name]  D:  03/10/2003  T:  03/10/2003  Job:   [id number]   cc:   Gwenlyn Perking, M.D.  [address]  [place]  [place] [address]  Fax: [phone]   [facility]   [address] [facility] Angiographic [facility]   [address] [place] [place],   [address] [facility]

## 2011-05-13 NOTE — Discharge Summary (Signed)
NAME:  Gabrielle [name], Gabrielle [name] NO.:  [id number]   MEDICAL RECORD NO.:  [id number]          PATIENT TYPE:  INP   LOCATION:  3014                         FACILITY:  [facility]   PHYSICIAN:  Princess Bruins. Hickling, M.D.DATE OF BIRTH:  [birth year]   DATE OF ADMISSION:  03/23/2007  DATE OF DISCHARGE:  03/25/2007                               DISCHARGE SUMMARY   FINAL DIAGNOSIS:  Embolic strokes, subcentimeter left brain,  cardioembolic and possible embolic in source.   PROCEDURES:  1. MRI, brain.  2. MRA, intracranial.  3. 2-D echocardiogram.  4. Carotid Doppler.  5. Laboratory studies.   COMPLICATIONS:  None.   HOSPITAL COURSE:  The patient is a 76 year old woman who had an episode  of syncope three days prior to admission and felt dizzy every time she  was upright.  She did not have vertigo but merely felt disequilibrium.   She presented to the [facility] emergency room and was admitted after  an MRI scan showed two subcentimeter lesions, one in the left parietal  and one in the left mesiofrontal lobe.   Her NIH stroke scale score was 0.  Her modified Rankin was 0.  It was  felt that the patient's symptoms did not match with her MRI scan;  nonetheless, a thorough workup was carried out to look for treatable  causes of stroke.   MRA of the brain was normal.  2-D echocardiogram showed no evidence of  hemodynamically significant lesions.  Carotid Doppler study showed  normal left ventricular function, no valvular disease, and an ejection  fraction of 55-60%.   The patient had laboratory studies which showed an elevated homocystine  of 16.3, total cholesterol 167, LDL 73, HDL low at 28, triglycerides  125, VLDL 25.  Glucoses varied throughout the hospital stay and were as  low as 53 and as high as 234 which was on admission.  They were never  that high again.   The patient has multiple risk factors for stroke including dyslipidemia,  hypertension, and type 2 diabetes but  also smoking.   We spent time talking about smoking.  Her husband smokes and has quit  before.  She agreed to go on the Nicoderm CQ patch at 14 mg, and that  seemed to work quite well in the hospital.  I have sent her home with  that, and we have counseled her on the need to continue to stop smoking  as a means to prevent further strokes.  I have also started her on Foltx  on hyperhomocysteinemia and placed her on enteric-coated aspirin 325 mg  a day.  This will be in addition to glipizide and metformin which seemed  to do a good job controlling her glucose, lisinopril,  hydrochlorothiazide, and diltiazem which are controlling her blood  pressure, pravastatin which possibly could be increased in order to  improve her low HDL cholesterol.  I did not do that today.   The patient should return to see Dr. Gwenlyn Perking for post-hospital  evaluation.  She should see me as needed depending upon clinical  symptoms.  If there are any  new symptoms, do not hesitate to contact me.  I appreciate the opportunity to participate in her care.   PHYSICAL EXAMINATION:  VITAL SIGNS:  Today, blood pressure 131/66,  resting pulse 63, respirations 20, temperature 98.  GENERAL:  Normal.  NEUROLOGIC:  Normal.   The patient is discharged in improved condition.      Princess Bruins. Gaynell Face, M.D.  Electronically Signed     [facility]/MEDQ  D:  03/25/2007  T:  03/25/2007  Job:  [id number]   cc:   Gwenlyn Perking, M.D.

## 2011-07-01 ENCOUNTER — Other Ambulatory Visit: Payer: Self-pay | Admitting: Emergency Medicine

## 2011-07-01 DIAGNOSIS — I639 Cerebral infarction, unspecified: Secondary | ICD-10-CM

## 2011-07-03 ENCOUNTER — Ambulatory Visit
Admission: RE | Admit: 2011-07-03 | Discharge: 2011-07-03 | Disposition: A | Discharge status: Home / Self Care | Payer: Medicare Other | Source: Ambulatory Visit | Attending: Emergency Medicine | Admitting: Emergency Medicine

## 2011-07-03 DIAGNOSIS — I639 Cerebral infarction, unspecified: Secondary | ICD-10-CM

## 2011-07-12 HISTORY — PX: NM MYOCAR PERF WALL MOTION: HXRAD629

## 2011-10-12 LAB — HEPATIC FUNCTION PANEL
ALT: 9 U/L (ref 7–35)
AST: 14 U/L (ref 13–35)
Alkaline Phosphatase: 65 U/L (ref 25–125)
Bilirubin, Total: 0.7 mg/dL

## 2011-10-12 LAB — BASIC METABOLIC PANEL
BUN: 26 mg/dL — AB (ref 4–21)
Creatinine: 1.4 mg/dL — AB (ref <=1.1)
Glucose: 131 mg/dL

## 2011-10-12 LAB — LIPID PANEL
Cholesterol: 145 mg/dL (ref 0–200)
HDL: 52 mg/dL (ref 35–70)
LDL Cholesterol: 70 mg/dL
LDl/HDL Ratio: 2.8
Triglycerides: 113 mg/dL (ref 40–160)

## 2011-10-12 LAB — TSH: TSH: 1.4 u[IU]/mL (ref <=5.90)

## 2012-01-09 DIAGNOSIS — N183 Chronic kidney disease, stage 3 unspecified: Secondary | ICD-10-CM | POA: Diagnosis not present

## 2012-01-17 ENCOUNTER — Encounter: Payer: Self-pay | Admitting: *Deleted

## 2012-01-17 DIAGNOSIS — E11649 Type 2 diabetes mellitus with hypoglycemia without coma: Secondary | ICD-10-CM | POA: Insufficient documentation

## 2012-01-17 DIAGNOSIS — Z862 Personal history of diseases of the blood and blood-forming organs and certain disorders involving the immune mechanism: Secondary | ICD-10-CM | POA: Insufficient documentation

## 2012-01-17 DIAGNOSIS — I119 Hypertensive heart disease without heart failure: Secondary | ICD-10-CM | POA: Insufficient documentation

## 2012-01-17 DIAGNOSIS — E785 Hyperlipidemia, unspecified: Secondary | ICD-10-CM

## 2012-01-17 DIAGNOSIS — I639 Cerebral infarction, unspecified: Secondary | ICD-10-CM | POA: Insufficient documentation

## 2012-01-17 DIAGNOSIS — I10 Essential (primary) hypertension: Secondary | ICD-10-CM

## 2012-01-17 DIAGNOSIS — R569 Unspecified convulsions: Secondary | ICD-10-CM | POA: Insufficient documentation

## 2012-01-18 ENCOUNTER — Other Ambulatory Visit: Payer: Self-pay | Admitting: Emergency Medicine

## 2012-01-18 ENCOUNTER — Ambulatory Visit (INDEPENDENT_AMBULATORY_CARE_PROVIDER_SITE_OTHER): Payer: Medicare Other

## 2012-01-18 DIAGNOSIS — R569 Unspecified convulsions: Secondary | ICD-10-CM

## 2012-01-18 DIAGNOSIS — R55 Syncope and collapse: Secondary | ICD-10-CM

## 2012-01-18 DIAGNOSIS — N189 Chronic kidney disease, unspecified: Secondary | ICD-10-CM

## 2012-01-18 DIAGNOSIS — E119 Type 2 diabetes mellitus without complications: Secondary | ICD-10-CM | POA: Diagnosis not present

## 2012-01-18 DIAGNOSIS — I951 Orthostatic hypotension: Secondary | ICD-10-CM | POA: Diagnosis not present

## 2012-01-18 DIAGNOSIS — E785 Hyperlipidemia, unspecified: Secondary | ICD-10-CM | POA: Diagnosis not present

## 2012-01-18 DIAGNOSIS — I10 Essential (primary) hypertension: Secondary | ICD-10-CM | POA: Diagnosis not present

## 2012-01-19 ENCOUNTER — Other Ambulatory Visit: Payer: Self-pay | Admitting: Emergency Medicine

## 2012-01-19 DIAGNOSIS — R51 Headache: Secondary | ICD-10-CM

## 2012-01-24 DIAGNOSIS — N2581 Secondary hyperparathyroidism of renal origin: Secondary | ICD-10-CM | POA: Diagnosis not present

## 2012-01-24 DIAGNOSIS — I129 Hypertensive chronic kidney disease with stage 1 through stage 4 chronic kidney disease, or unspecified chronic kidney disease: Secondary | ICD-10-CM | POA: Diagnosis not present

## 2012-01-24 DIAGNOSIS — N183 Chronic kidney disease, stage 3 unspecified: Secondary | ICD-10-CM | POA: Diagnosis not present

## 2012-01-24 DIAGNOSIS — R5381 Other malaise: Secondary | ICD-10-CM | POA: Diagnosis not present

## 2012-01-24 LAB — PROTEIN ELECTROPHORESIS, SERUM
Albumin ELP: 60.1 % (ref 55.8–66.1)
Alpha-1-Globulin: 5 % — ABNORMAL HIGH (ref 2.9–4.9)
Alpha-2-Globulin: 12.9 % — ABNORMAL HIGH (ref 7.1–11.8)
Beta 2: 3.9 % (ref 3.2–6.5)
Beta Globulin: 7.3 % — ABNORMAL HIGH (ref 4.7–7.2)
Gamma Globulin: 10.8 % — ABNORMAL LOW (ref 11.1–18.8)
Total Protein, Serum Electrophoresis: 6 g/dL (ref 6.0–8.3)

## 2012-01-26 ENCOUNTER — Ambulatory Visit
Admission: RE | Admit: 2012-01-26 | Discharge: 2012-01-26 | Disposition: A | Discharge status: Home / Self Care | Payer: Medicare Other | Source: Ambulatory Visit | Attending: Emergency Medicine | Admitting: Emergency Medicine

## 2012-01-26 DIAGNOSIS — R55 Syncope and collapse: Secondary | ICD-10-CM | POA: Diagnosis not present

## 2012-01-26 DIAGNOSIS — M549 Dorsalgia, unspecified: Secondary | ICD-10-CM | POA: Diagnosis not present

## 2012-01-26 DIAGNOSIS — N281 Cyst of kidney, acquired: Secondary | ICD-10-CM | POA: Diagnosis not present

## 2012-01-26 DIAGNOSIS — R51 Headache: Secondary | ICD-10-CM

## 2012-01-26 DIAGNOSIS — I6789 Other cerebrovascular disease: Secondary | ICD-10-CM | POA: Diagnosis not present

## 2012-01-26 DIAGNOSIS — R413 Other amnesia: Secondary | ICD-10-CM | POA: Diagnosis not present

## 2012-01-26 DIAGNOSIS — R1011 Right upper quadrant pain: Secondary | ICD-10-CM | POA: Diagnosis not present

## 2012-01-27 ENCOUNTER — Telehealth: Payer: Self-pay

## 2012-01-27 NOTE — Telephone Encounter (Signed)
Message copied by Gonzella Lex on Fri Jan 27, 2012  4:49 PM ------      Message from: [name] A      Created: Fri Jan 27, 2012  4:34 PM       [place] normal. Please call

## 2012-01-28 NOTE — Telephone Encounter (Signed)
Patient notified u/s results.  Would like CT-head results-- have you reviewed?  Msg to MD.

## 2012-01-28 NOTE — Telephone Encounter (Signed)
Spoke with pt and notified CT results.

## 2012-02-01 DIAGNOSIS — R569 Unspecified convulsions: Secondary | ICD-10-CM | POA: Diagnosis not present

## 2012-02-01 DIAGNOSIS — R209 Unspecified disturbances of skin sensation: Secondary | ICD-10-CM | POA: Diagnosis not present

## 2012-02-01 DIAGNOSIS — R55 Syncope and collapse: Secondary | ICD-10-CM | POA: Diagnosis not present

## 2012-02-06 DIAGNOSIS — I6529 Occlusion and stenosis of unspecified carotid artery: Secondary | ICD-10-CM | POA: Diagnosis not present

## 2012-02-14 ENCOUNTER — Ambulatory Visit: Payer: Self-pay | Admitting: Emergency Medicine

## 2012-02-17 ENCOUNTER — Telehealth: Payer: Self-pay

## 2012-02-17 DIAGNOSIS — I739 Peripheral vascular disease, unspecified: Secondary | ICD-10-CM | POA: Diagnosis not present

## 2012-02-17 DIAGNOSIS — I6529 Occlusion and stenosis of unspecified carotid artery: Secondary | ICD-10-CM | POA: Diagnosis not present

## 2012-02-17 DIAGNOSIS — I10 Essential (primary) hypertension: Secondary | ICD-10-CM | POA: Diagnosis not present

## 2012-02-17 NOTE — Telephone Encounter (Signed)
Labs faxed to [facility] with confirmation.

## 2012-02-17 NOTE — Telephone Encounter (Signed)
Kristen from [facility] would like to have pt's most recent labs faxed to them. Fax:[phone] CB:[phone]

## 2012-02-20 ENCOUNTER — Other Ambulatory Visit: Payer: Self-pay | Admitting: Emergency Medicine

## 2012-02-21 ENCOUNTER — Other Ambulatory Visit: Payer: Self-pay | Admitting: Emergency Medicine

## 2012-02-21 NOTE — Telephone Encounter (Signed)
.  [facility]    [facility] REQUESTING BLOODWORK BE FAXED TO [phone]

## 2012-02-22 ENCOUNTER — Telehealth: Payer: Self-pay

## 2012-02-22 MED ORDER — ZOLPIDEM TARTRATE 10 MG PO TABS: 10.0000 mg | ORAL_TABLET | Freq: Every evening | ORAL | Status: DC | PRN

## 2012-02-22 NOTE — Telephone Encounter (Signed)
On 1/25 pt received per pharmacy request that she got #90 so she should not be out until 3/25.

## 2012-02-22 NOTE — Telephone Encounter (Signed)
.  [facility] PT STATES SHE HAD CALLED HER PHARMACY FOR A REFILL ON HER ZOLPRIDEM AND WAS TOLD IT WAS TO SOON, SHE LAST RECEIVED THE MEDICINE 01/20/12 SHE IS OUT AND NEED TO HAVE HER MEDICINE BEFORE TODAY IS OVER. PLEASE CALL [phone] OR [phone]   [facility]

## 2012-02-22 NOTE — Telephone Encounter (Signed)
She only got #30 on 1/25 so will refill.

## 2012-03-16 ENCOUNTER — Other Ambulatory Visit: Payer: Self-pay | Admitting: Emergency Medicine

## 2012-03-16 ENCOUNTER — Encounter: Payer: Self-pay | Admitting: Emergency Medicine

## 2012-03-16 ENCOUNTER — Ambulatory Visit (INDEPENDENT_AMBULATORY_CARE_PROVIDER_SITE_OTHER): Payer: Medicare Other | Admitting: Emergency Medicine

## 2012-03-16 VITALS — BP 187/84 | HR 91 | Temp 98.0°F | Resp 16 | Ht 61.0 in | Wt 116.2 lb

## 2012-03-16 DIAGNOSIS — D649 Anemia, unspecified: Secondary | ICD-10-CM | POA: Diagnosis not present

## 2012-03-16 DIAGNOSIS — E119 Type 2 diabetes mellitus without complications: Secondary | ICD-10-CM

## 2012-03-16 DIAGNOSIS — R42 Dizziness and giddiness: Secondary | ICD-10-CM | POA: Diagnosis not present

## 2012-03-16 DIAGNOSIS — E785 Hyperlipidemia, unspecified: Secondary | ICD-10-CM

## 2012-03-16 DIAGNOSIS — R569 Unspecified convulsions: Secondary | ICD-10-CM | POA: Diagnosis not present

## 2012-03-16 DIAGNOSIS — R634 Abnormal weight loss: Secondary | ICD-10-CM

## 2012-03-16 DIAGNOSIS — I10 Essential (primary) hypertension: Secondary | ICD-10-CM

## 2012-03-16 LAB — POCT GLYCOSYLATED HEMOGLOBIN (HGB A1C): Hemoglobin A1C: 6

## 2012-03-16 LAB — GLUCOSE, POCT (MANUAL RESULT ENTRY): POC Glucose: 149

## 2012-03-16 MED ORDER — AMLODIPINE BESYLATE 5 MG PO TABS: 5.0000 mg | ORAL_TABLET | Freq: Every day | ORAL | Status: DC

## 2012-03-16 MED ORDER — AMLODIPINE BESYLATE 5 MG PO TABS: 2.5000 mg | ORAL_TABLET | Freq: Every day | ORAL | Status: DC

## 2012-03-16 NOTE — Progress Notes (Signed)
  Subjective:    Patient ID: Gabrielle [name], female    DOB: [birth year], 77 y.o.   MRN: [id number]  HPI patient asked to come in because she has a history of anemia associated with an abnormal protein electrophoresis. She does have a renal specialist who follows her. She also has a cardiologist who follows her. She's been seen by Dr. Benson Norway.    Review of Systems patient states she just does not feel well. She states she is fatigued. She was a poor appetite. He has no other specific complaints except she does have dizziness. She's not had any recent seizures so this has been good. She she was recently seen her cardiologist in the checkup was good. She's not on any seizure medications but states that ever since she took the seizure medication she has felt poorly.     Objective:   Physical Exam  Constitutional: She appears well-developed and well-nourished.  Eyes: Pupils are equal, round, and reactive to light.  Neck: No JVD present. No tracheal deviation present. No thyromegaly present.  Cardiovascular: Normal rate, regular rhythm and normal heart sounds.  Exam reveals no gallop and no friction rub.   No murmur heard. Pulmonary/Chest: Effort normal and breath sounds normal. No respiratory distress. She has no wheezes. She has no rales. She exhibits no tenderness.  Abdominal: She exhibits no distension and no mass. There is no tenderness. There is no rebound and no guarding.  Musculoskeletal: Normal range of motion.  Lymphadenopathy:    She has no cervical adenopathy.  Neurological: She is alert. No cranial nerve deficit.       Patient develop dizziness vertigo from going to a lying position to sitting. She also states that when she lies back she feels incredibly dizzy.  Skin: Skin is warm and dry.  Psychiatric: She has a normal mood and affect. Her behavior is normal.          Assessment & Plan:  To followup on elevated sugar also need to repeat her known protein electrophoresis because  she does have the anemia and renal disease. Blood pressure is up today we'll increase her Norvasc to 5 mg a day. We'll keep her on the lower dose of her ACE drug because of her renal dysfunction. She has a lesion on her finger and she is going to decide if she wants to see the hand doctor about this. She also is complaining of bilateral ptosis upper lids. I told her the only treatment for this would be surgery and she is going to consider that.

## 2012-03-16 NOTE — Progress Notes (Signed)
  Subjective:    Patient ID: Gabrielle [name], female    DOB: [birth year], 77 y.o.   MRN: [id number]  HPI    Review of Systems  Constitutional: Positive for appetite change. Negative for unexpected weight change.  HENT: Positive for sneezing.   Eyes: Negative.   Respiratory: Negative.   Cardiovascular: Negative.   Gastrointestinal: Negative.   Genitourinary: Positive for frequency.  Neurological: Positive for seizures.       Objective:   Physical Exam        Assessment & Plan:

## 2012-03-17 ENCOUNTER — Other Ambulatory Visit: Payer: Self-pay | Admitting: Emergency Medicine

## 2012-03-17 DIAGNOSIS — D890 Polyclonal hypergammaglobulinemia: Secondary | ICD-10-CM

## 2012-03-17 LAB — CBC WITH DIFFERENTIAL/PLATELET
Basophils Absolute: 0 10*3/uL (ref 0.0–0.1)
Basophils Relative: 1 % (ref 0–1)
Eosinophils Absolute: 0.1 10*3/uL (ref 0.0–0.7)
Eosinophils Relative: 1 % (ref 0–5)
HCT: 35.9 % — ABNORMAL LOW (ref 36.0–46.0)
Hemoglobin: 11.2 g/dL — ABNORMAL LOW (ref 12.0–15.0)
Lymphocytes Relative: 16 % (ref 12–46)
Lymphs Abs: 1.2 10*3/uL (ref 0.7–4.0)
MCH: 29 pg (ref 26.0–34.0)
MCHC: 31.2 g/dL (ref 30.0–36.0)
MCV: 93 fL (ref 78.0–100.0)
Monocytes Absolute: 0.6 10*3/uL (ref 0.1–1.0)
Monocytes Relative: 9 % (ref 3–12)
Neutro Abs: 5.3 10*3/uL (ref 1.7–7.7)
Neutrophils Relative %: 74 % (ref 43–77)
Platelets: 406 10*3/uL — ABNORMAL HIGH (ref 150–400)
RBC: 3.86 MIL/uL — ABNORMAL LOW (ref 3.87–5.11)
RDW: 15 % (ref 11.5–15.5)
WBC: 7.2 10*3/uL (ref 4.0–10.5)

## 2012-03-17 LAB — LIPID PANEL
Cholesterol: 141 mg/dL (ref 0–200)
HDL: 59 mg/dL (ref >39)
LDL Cholesterol: 68 mg/dL (ref 0–99)
Total CHOL/HDL Ratio: 2.4 Ratio
Triglycerides: 68 mg/dL (ref <150)
VLDL: 14 mg/dL (ref 0–40)

## 2012-03-17 LAB — TSH: TSH: 1.191 u[IU]/mL (ref 0.350–4.500)

## 2012-03-19 ENCOUNTER — Other Ambulatory Visit: Payer: Self-pay | Admitting: Physician Assistant

## 2012-03-20 ENCOUNTER — Telehealth: Payer: Self-pay | Admitting: Hematology & Oncology

## 2012-03-20 LAB — SPEP & IFE WITH QIG
Albumin ELP: 57.1 % (ref 55.8–66.1)
Alpha-1-Globulin: 5.2 % — ABNORMAL HIGH (ref 2.9–4.9)
Alpha-2-Globulin: 14.4 % — ABNORMAL HIGH (ref 7.1–11.8)
Beta 2: 4.7 % (ref 3.2–6.5)
Beta Globulin: 7.1 % (ref 4.7–7.2)
Gamma Globulin: 11.5 % (ref 11.1–18.8)
IgA: 320 mg/dL (ref 69–380)
IgG (Immunoglobin G), Serum: 819 mg/dL (ref 690–1700)
IgM, Serum: 113 mg/dL (ref 52–322)
Total Protein, Serum Electrophoresis: 7.1 g/dL (ref 6.0–8.3)

## 2012-03-20 NOTE — Telephone Encounter (Signed)
Pt states she called in rx refill with [place] 03-19-12 and has not heard anything pharmacy is [facility].Marland Kitchen

## 2012-03-20 NOTE — Telephone Encounter (Signed)
Pt aware of 4-16 appointment. Left message with Butch Penny from referring [phone] ext [phone]

## 2012-03-22 ENCOUNTER — Telehealth: Payer: Self-pay

## 2012-03-22 NOTE — Telephone Encounter (Signed)
.  [facility] Patient called for second time regarding medication refill for Zolpidem.  Patient is very upset that she has not had this medication refilled and is going to [facility] this morning and wants to pick up her medicine this morning when she arrives there.  Patient home phone number is [phone] and cell phone is [phone].  Patient states she does not have voicemail on home phone and if no answer on home phone would like cell phone called to verify this has been refilled.

## 2012-04-10 ENCOUNTER — Ambulatory Visit: Payer: Medicare Other

## 2012-04-10 ENCOUNTER — Other Ambulatory Visit (HOSPITAL_BASED_OUTPATIENT_CLINIC_OR_DEPARTMENT_OTHER): Payer: Medicare Other | Admitting: Lab

## 2012-04-10 ENCOUNTER — Ambulatory Visit (HOSPITAL_BASED_OUTPATIENT_CLINIC_OR_DEPARTMENT_OTHER): Payer: Medicare Other | Admitting: Hematology & Oncology

## 2012-04-10 VITALS — BP 119/64 | HR 93 | Temp 98.3°F | Ht 60.0 in | Wt 116.0 lb

## 2012-04-10 DIAGNOSIS — D649 Anemia, unspecified: Secondary | ICD-10-CM

## 2012-04-10 LAB — CBC WITH DIFFERENTIAL (CANCER CENTER ONLY)
BASO#: 0 10*3/uL (ref 0.0–0.2)
BASO%: 0.1 % (ref 0.0–2.0)
EOS%: 0 % (ref 0.0–7.0)
Eosinophils Absolute: 0 10*3/uL (ref 0.0–0.5)
HCT: 30.6 % — ABNORMAL LOW (ref 34.8–46.6)
HGB: 9.9 g/dL — ABNORMAL LOW (ref 11.6–15.9)
LYMPH#: 0.7 10*3/uL — ABNORMAL LOW (ref 0.9–3.3)
LYMPH%: 5.2 % — ABNORMAL LOW (ref 14.0–48.0)
MCH: 28.3 pg (ref 26.0–34.0)
MCHC: 32.4 g/dL (ref 32.0–36.0)
MCV: 87 fL (ref 81–101)
MONO#: 0.7 10*3/uL (ref 0.1–0.9)
MONO%: 5 % (ref 0.0–13.0)
NEUT#: 12.1 10*3/uL — ABNORMAL HIGH (ref 1.5–6.5)
NEUT%: 89.7 % — ABNORMAL HIGH (ref 39.6–80.0)
Platelets: 410 10*3/uL — ABNORMAL HIGH (ref 145–400)
RBC: 3.5 10*6/uL — ABNORMAL LOW (ref 3.70–5.32)
RDW: 15.1 % (ref 11.1–15.7)
WBC: 13.6 10*3/uL — ABNORMAL HIGH (ref 3.9–10.0)

## 2012-04-10 LAB — CHCC SATELLITE - SMEAR

## 2012-04-10 NOTE — Progress Notes (Signed)
This office note has been dictated.

## 2012-04-11 NOTE — Progress Notes (Signed)
CC:   Gabrielle [name]. Gabrielle [name], M.D. Gabrielle [name], M.D.  REFERRING PHYSICIAN:  Lina [name]. Gabrielle [name], M.D.  DIAGNOSIS:  Anemia-normochromic, normocytic.  HISTORY OF PRESENT ILLNESS:  Gabrielle [name] is a very nice 77 year old white female.  She is followed by Gabrielle [name].  He has noticed that she has had issues with anemia.  She has multiple medical issues.  She has had a history of seizures.  She says that she was put on an antiseizure medication and since then has not felt well.  Gabrielle [name] has done lab work on her.  Going back to August 2011, hemoglobin was 9.2, hematocrit 29.8.  White cell count is 5.8, platelet count 273.  In March of 2013, CBC was done which showed a white count 7.2, hemoglobin 9.2, hematocrit 36, platelet count was 406.  MCV was 93.  Of note, she really has 1 kidney.  She had her right kidney removed because of kidney cancer back in, I think, 2003 or so.  She has not had any obvious bleeding.  She said her last colonoscopy was 4 years ago.  She feels tired on occasion.  She has had no rashes.  There has been some joint issues from arthritis.  She states she cannot take arthritic medicine because of her 1 kidney.  She had an ultrasound of the abdomen back in January.  This was unremarkable.  She did have a right nephrectomy. She has some cysts in her left kidney.  She did have a CT of the head back in January.  This was unremarkable.  Her last mammogram was done back in January and everything  looked okay on the mammogram.  She is not a vegetarian.  She used to smoke but stopped about 6 years ago.  She has had no swollen lymph glands.  She has had no double vision or blurred vision.  She has had no weight loss or weight gain.  Again, we were asked to see her because of this recurrent anemia.  She did have some myeloma studies done back in March.  There was no monoclonal spike noted on her SPEP.  She had normal immunoglobulin levels.  Her last BUN and creatinine  that I have from October were 26 and 1.4.  She has no "cravings."  She does not chew ice.  There is no dysphasia or odynophagia.  PAST MEDICAL HISTORY: 1. Right renal cell CA, status post nephrectomy. 2. Non-insulin dependent diabetes. 3. Hyperlipidemia. 4. Hypertension. 5. Seizures.  ALLERGIES: 1. Codeine. 2. Hydrocodone. 3. Levetiracetam. 4. Metformin. 5. Metoprolol. 6. Vimpat.  MEDICATIONS: 1. Allopurinol 3 mg p.o. daily. 2. Norvasc 5 mg p.o. daily. 3. Aspirin 81 mg p.o. daily. 4. Plavix 75 mg p.o. daily. 5. Hydrochlorothiazide 12.5 mg p.o. daily. 6. Lisinopril 20 mg p.o. daily. 7. Pravachol 40 mg p.o. daily. 8. Ambien 10 mg p.o. q.h.s. p.r.n.  SOCIAL HISTORY:  Remarkable for tobacco use.  She probably has about a 50-pack-year history of tobacco use.  She stopped about 8 years ago. There is no alcohol use.  She has no obvious occupational exposures.  FAMILY HISTORY:  Noncontributory.  REVIEW OF SYSTEMS:  As stated in history of present illness.  PHYSICAL EXAMINATION:  This is a very petite white female in no obvious distress.  Vital signs:  Temperature 98.3, pulse 93, respiratory rate 18, blood pressure 119/64.  Weight is 116.  Head and neck exam shows a normocephalic, atraumatic skull.  There are no ocular or oral lesions. There are no  palpable cervical or supraclavicular lymph nodes.  Lungs: Clear bilaterally.  She has no rales, wheezes or rhonchi.  Cardiac: Regular rate and rhythm with a normal S1 and S2.  There are no murmurs, rubs or bruits.  Abdomen:  Soft with good bowel sounds.  There is no palpable abdominal mass.  There is no fluid wave.  She has a right nephrectomy scar that is well-healed.  She has no palpable hepatosplenomegaly.  Back:  No tenderness over the spine, ribs, or hips. She has no kyphosis or osteoporotic changes.  Rectal:  Small  external hemorrhoid.  There is no mass in the rectal vault.  Her stool is brown and heme-negative.   Extremities: No clubbing, cyanosis or edema.  She may have some age-related osteoarthritic changes in her joints.  She has good range motion of her joints.  Skin:  No rashes, ecchymosis or petechia.  Neurologic:  No focal neurological deficits.  LABORATORY STUDIES:  White cell count is 13.6, hemoglobin 9.9, hematocrit 30.6, platelet count is 410.  Peripheral smear shows a mild anisocytosis and poikilocytosis.  She has microcytic red cells.  There are some hypochromic red cells.  She has no schistocytes.  There are no spherocytes.  I see no nucleated red blood cells.  There is no rouleaux formation.  I see no target cells.  White cells are slightly increased in number.  She has mature polys.  I do not see any hypersegmented polys.  There is no immature myeloid cells.  I see no blasts.  There is no atypical lymphocytes.  Platelets are adequate in number and size. Platelets are well granulated.  IMPRESSION:  Gabrielle [name] is a 77 year old white female with mild anemia. She has had this for 2 years.  I have to believe that this is going to reflect upon her having only 1 kidney.  I suspect that her erythropoietin level is going to be low.  I also suspect that she may be iron deficient.  I did check her stools for bleeding and that was all negative.  However, her blood smear definitely suggests iron deficiency.  I suppose that if iron levels are normal, she may have a myelodysplastic type process.  By her CBC, one would think that she is on prednisone.  Her white cell count is elevated with a elevation of neutrophils and decrease in lymphocytes.  However, she says she is not taking any kind of prednisone or steroids.  I will await the results of her blood work.  Again, I have to believe that this is going to boil down to her having only 1 kidney and a low erythropoietin level and possibly iron deficiency.  I think we can probably hold off on a bone marrow test on her.  I would only  consider doing a bone marrow test if we find that her iron studies are okay and her erythropoietin level is normal or on the high side.  I suppose that she may have refractory anemia.  She may fall into the low risk of myelodysplastic category.  However, a bone marrow biopsy would be the only way for [place] to prove this.  Ms. Bignell is very, very nice.  We had a nice time with her.  We spent a good hour with her.  I reviewed her lab work with her.  Will plan to get Ms. Hannula back after we get all the lab work in. Then, we will be able to figure out how we can proceed to help get  her blood count better.    ______________________________ Volanda Napoleon, M.D. PRE/MEDQ  D:  04/10/2012  T:  04/10/2012  Job:  [id number]  ADDENDUM:  Ferritin is 11.  %Sat is 13.  EPO is 13.3.  Clearly needs IV Iron.                May need ESA if iron doesn't do the job.

## 2012-04-12 LAB — COMPREHENSIVE METABOLIC PANEL
ALT: 8 U/L (ref 0–35)
AST: 13 U/L (ref 0–37)
Albumin: 4.3 g/dL (ref 3.5–5.2)
Alkaline Phosphatase: 74 U/L (ref 39–117)
BUN: 28 mg/dL — ABNORMAL HIGH (ref 6–23)
CO2: 22 mEq/L (ref 19–32)
Calcium: 9.7 mg/dL (ref 8.4–10.5)
Chloride: 100 mEq/L (ref 96–112)
Creatinine, Ser: 1.4 mg/dL — ABNORMAL HIGH (ref 0.50–1.10)
Glucose, Bld: 234 mg/dL — ABNORMAL HIGH (ref 70–99)
Potassium: 4.1 mEq/L (ref 3.5–5.3)
Sodium: 136 mEq/L (ref 135–145)
Total Bilirubin: 0.5 mg/dL (ref 0.3–1.2)
Total Protein: 7 g/dL (ref 6.0–8.3)

## 2012-04-12 LAB — PROTEIN ELECTROPHORESIS, SERUM, WITH REFLEX
Albumin ELP: 59 % (ref 55.8–66.1)
Alpha-1-Globulin: 5 % — ABNORMAL HIGH (ref 2.9–4.9)
Alpha-2-Globulin: 13.2 % — ABNORMAL HIGH (ref 7.1–11.8)
Beta 2: 4.5 % (ref 3.2–6.5)
Beta Globulin: 7.2 % (ref 4.7–7.2)
Gamma Globulin: 11.1 % (ref 11.1–18.8)
Total Protein, Serum Electrophoresis: 7 g/dL (ref 6.0–8.3)

## 2012-04-12 LAB — KAPPA/LAMBDA LIGHT CHAINS
Kappa free light chain: 2.46 mg/dL — ABNORMAL HIGH (ref 0.33–1.94)
Kappa:Lambda Ratio: 0.78 (ref 0.26–1.65)
Lambda Free Lght Chn: 3.14 mg/dL — ABNORMAL HIGH (ref 0.57–2.63)

## 2012-04-12 LAB — ERYTHROPOIETIN: Erythropoietin: 13.3 m[IU]/mL (ref 2.6–34.0)

## 2012-04-12 LAB — RETICULOCYTES (CHCC)
ABS Retic: 64.6 10*3/uL (ref 19.0–186.0)
RBC.: 3.59 MIL/uL — ABNORMAL LOW (ref 3.87–5.11)
Retic Ct Pct: 1.8 % (ref 0.4–2.3)

## 2012-04-12 LAB — IGG, IGA, IGM
IgA: 284 mg/dL (ref 69–380)
IgG (Immunoglobin G), Serum: 921 mg/dL (ref 690–1700)
IgM, Serum: 106 mg/dL (ref 52–322)

## 2012-04-12 LAB — IRON AND TIBC
%SAT: 13 % — ABNORMAL LOW (ref 20–55)
Iron: 52 ug/dL (ref 42–145)
TIBC: 405 ug/dL (ref 250–470)
UIBC: 353 ug/dL (ref 125–400)

## 2012-04-12 LAB — FERRITIN: Ferritin: 11 ng/mL (ref 10–291)

## 2012-04-13 ENCOUNTER — Telehealth: Payer: Self-pay | Admitting: *Deleted

## 2012-04-13 NOTE — Telephone Encounter (Addendum)
Message copied by Orlando Penner on Fri Apr 13, 2012  5:10 PM ------      Message from: Burney Gauze R      Created: Wed Apr 11, 2012  2:07 PM       Call -iron is very low.  Need feraheme 1020mg  in 1-2 wks This message given to pt.  She will call and schedule this next week.

## 2012-04-16 ENCOUNTER — Telehealth: Payer: Self-pay | Admitting: Hematology & Oncology

## 2012-04-16 NOTE — Telephone Encounter (Signed)
Pt called made 4-26 iron infusion appointment

## 2012-04-20 ENCOUNTER — Ambulatory Visit (HOSPITAL_BASED_OUTPATIENT_CLINIC_OR_DEPARTMENT_OTHER): Payer: Medicare Other

## 2012-04-20 VITALS — BP 143/63 | HR 81

## 2012-04-20 DIAGNOSIS — D649 Anemia, unspecified: Secondary | ICD-10-CM | POA: Diagnosis not present

## 2012-04-20 MED ORDER — SODIUM CHLORIDE 0.9 % IV SOLN
1020.0000 mg | Freq: Once | INTRAVENOUS | Status: AC
  Administered 2012-04-20: 1020 mg via INTRAVENOUS
  Filled 2012-04-20: qty 34

## 2012-04-20 NOTE — Patient Instructions (Signed)
Ferumoxytol injection What is this medicine? FERUMOXYTOL is an iron complex. Iron is used to make healthy red blood cells, which carry oxygen and nutrients throughout the body. This medicine is used to treat iron deficiency anemia in people with chronic kidney disease. This medicine may be used for other purposes; ask your health care provider or pharmacist if you have questions. What should I tell my health care provider before I take this medicine? They need to know if you have any of these conditions: -anemia not caused by low iron levels -high levels of iron in the blood -magnetic resonance imaging (MRI) test scheduled -an unusual or allergic reaction to iron, other medicines, foods, dyes, or preservatives -pregnant or trying to get pregnant -breast-feeding How should I use this medicine? This medicine is for infusion into a vein. It is given by a health care professional in a hospital or clinic setting. Talk to your pediatrician regarding the use of this medicine in children. Special care may be needed. Overdosage: If you think you've taken too much of this medicine contact a poison control center or emergency room at once. Overdosage: If you think you have taken too much of this medicine contact a poison control center or emergency room at once. NOTE: This medicine is only for you. Do not share this medicine with others. What if I miss a dose? It is important not to miss your dose. Call your doctor or health care professional if you are unable to keep an appointment. What may interact with this medicine? This medicine may interact with the following medications: -other iron products This list may not describe all possible interactions. Give your health care provider a list of all the medicines, herbs, non-prescription drugs, or dietary supplements you use. Also tell them if you smoke, drink alcohol, or use illegal drugs. Some items may interact with your medicine. What should I watch  for while using this medicine? Visit your doctor or healthcare professional regularly. Tell your doctor or healthcare professional if your symptoms do not start to get better or if they get worse. You may need blood work done while you are taking this medicine. You may need to follow a special diet. Talk to your doctor. Foods that contain iron include: whole grains/cereals, dried fruits, beans, or peas, leafy green vegetables, and organ meats (liver, kidney). What side effects may I notice from receiving this medicine? Side effects that you should report to your doctor or health care professional as soon as possible: -allergic reactions like skin rash, itching or hives, swelling of the face, lips, or tongue -breathing problems -changes in blood pressure -feeling faint or lightheaded, falls -fever or chills -flushing, sweating, or hot feelings -swelling of the ankles or feet Side effects that usually do not require medical attention (Report these to your doctor or health care professional if they continue or are bothersome.): -diarrhea -headache -nausea, vomiting -stomach pain This list may not describe all possible side effects. Call your doctor for medical advice about side effects. You may report side effects to FDA at 1-800-FDA-1088. Where should I keep my medicine? This drug is given in a hospital or clinic and will not be stored at home. NOTE: This sheet is a summary. It may not cover all possible information. If you have questions about this medicine, talk to your doctor, pharmacist, or health care provider.  2012, Elsevier/Gold Standard. (09/03/2008 9:48:25 PM) 

## 2012-04-21 ENCOUNTER — Other Ambulatory Visit: Payer: Self-pay | Admitting: Physician Assistant

## 2012-04-23 ENCOUNTER — Telehealth: Payer: Self-pay | Admitting: Family Medicine

## 2012-04-23 MED ORDER — ZOLPIDEM TARTRATE 10 MG PO TABS: 10.0000 mg | ORAL_TABLET | Freq: Every evening | ORAL | Status: DC | PRN

## 2012-04-23 NOTE — Telephone Encounter (Signed)
All of her prescriptions were sent except for ambien.   Can we fax this over

## 2012-04-23 NOTE — Telephone Encounter (Signed)
Rx faxed in.  Patient notified.

## 2012-04-23 NOTE — Telephone Encounter (Signed)
Patient called to check on status of message. Needs ambien for tonight.

## 2012-04-23 NOTE — Telephone Encounter (Signed)
Done

## 2012-04-26 ENCOUNTER — Other Ambulatory Visit: Payer: Self-pay | Admitting: Hematology & Oncology

## 2012-04-26 DIAGNOSIS — D631 Anemia in chronic kidney disease: Secondary | ICD-10-CM

## 2012-04-26 DIAGNOSIS — N189 Chronic kidney disease, unspecified: Secondary | ICD-10-CM

## 2012-04-26 DIAGNOSIS — D509 Iron deficiency anemia, unspecified: Secondary | ICD-10-CM

## 2012-04-27 ENCOUNTER — Telehealth: Payer: Self-pay | Admitting: Hematology & Oncology

## 2012-04-27 NOTE — Telephone Encounter (Signed)
Pt aware of 5-24 appointment

## 2012-05-04 ENCOUNTER — Ambulatory Visit (INDEPENDENT_AMBULATORY_CARE_PROVIDER_SITE_OTHER): Payer: Medicare Other | Admitting: Emergency Medicine

## 2012-05-04 VITALS — BP 111/65 | HR 91 | Temp 97.8°F | Resp 16 | Ht 60.5 in | Wt 117.2 lb

## 2012-05-04 DIAGNOSIS — D509 Iron deficiency anemia, unspecified: Secondary | ICD-10-CM

## 2012-05-04 DIAGNOSIS — R7309 Other abnormal glucose: Secondary | ICD-10-CM | POA: Diagnosis not present

## 2012-05-04 DIAGNOSIS — I10 Essential (primary) hypertension: Secondary | ICD-10-CM

## 2012-05-04 DIAGNOSIS — R42 Dizziness and giddiness: Secondary | ICD-10-CM | POA: Diagnosis not present

## 2012-05-04 DIAGNOSIS — G4700 Insomnia, unspecified: Secondary | ICD-10-CM

## 2012-05-04 DIAGNOSIS — M67449 Ganglion, unspecified hand: Secondary | ICD-10-CM

## 2012-05-04 DIAGNOSIS — E785 Hyperlipidemia, unspecified: Secondary | ICD-10-CM

## 2012-05-04 DIAGNOSIS — N189 Chronic kidney disease, unspecified: Secondary | ICD-10-CM | POA: Diagnosis not present

## 2012-05-04 DIAGNOSIS — R739 Hyperglycemia, unspecified: Secondary | ICD-10-CM

## 2012-05-04 LAB — GLUCOSE, POCT (MANUAL RESULT ENTRY): POC Glucose: 184

## 2012-05-04 MED ORDER — LISINOPRIL 20 MG PO TABS: 10.0000 mg | ORAL_TABLET | Freq: Every day | ORAL | Status: DC

## 2012-05-04 MED ORDER — AMLODIPINE BESYLATE 5 MG PO TABS: 2.5000 mg | ORAL_TABLET | Freq: Every day | ORAL | Status: DC

## 2012-05-04 MED ORDER — ZOLPIDEM TARTRATE 10 MG PO TABS: 5.0000 mg | ORAL_TABLET | Freq: Every evening | ORAL | Status: DC | PRN

## 2012-05-04 MED ORDER — PRAVASTATIN SODIUM 40 MG PO TABS: 40.0000 mg | ORAL_TABLET | Freq: Every day | ORAL | Status: DC

## 2012-05-04 MED ORDER — GLIPIZIDE ER 2.5 MG PO TB24: 2.5000 mg | ORAL_TABLET | Freq: Every day | ORAL | Status: DC

## 2012-05-04 MED ORDER — ALLOPURINOL 300 MG PO TABS: 300.0000 mg | ORAL_TABLET | Freq: Every day | ORAL | Status: DC

## 2012-05-04 NOTE — Progress Notes (Signed)
  Subjective:    Patient ID: Gabrielle [name], female    DOB: 08/11/1935, 77 y.o.   MRN: [id number]  HPI patient had episode this morning when she got up felt weak and dizzy and felt as though she might faint. She had no chest pain associated with this. She had no weakness in her arms or legs. Patient was found recently to have an elevated sugar however her hemoglobin A1c was 6. She did go see Dr. Marin Olp and her sugar was in the 200s.    Review of Systems she recently has had problems with urinary frequency.     Objective:   Physical Exam  Constitutional: She is oriented to person, place, and time. She appears well-developed and well-nourished.  Eyes: Pupils are equal, round, and reactive to light.  Neck: No tracheal deviation present. No thyromegaly present.  Cardiovascular: Normal rate, regular rhythm and normal heart sounds.   Pulmonary/Chest: No respiratory distress. She has no wheezes. She has no rales. She exhibits no tenderness.  Abdominal: She exhibits no distension and no mass. There is no tenderness. There is no rebound and no guarding.  Musculoskeletal: Normal range of motion.  Neurological: She is alert and oriented to person, place, and time.    Results for orders placed in visit on 05/04/12  GLUCOSE, POCT (MANUAL RESULT ENTRY)      Component Value Range   POC Glucose 184          Assessment & Plan:     I think the patient had orthostatic spell this morning. Her sugars have been running high recently. She has not been on medications for this. Patient does not want to take insulin. Try her on Glucotrol extended release 2.5 mg one a day recheck 1 month

## 2012-05-11 DIAGNOSIS — N183 Chronic kidney disease, stage 3 unspecified: Secondary | ICD-10-CM | POA: Diagnosis not present

## 2012-05-11 DIAGNOSIS — R5381 Other malaise: Secondary | ICD-10-CM | POA: Diagnosis not present

## 2012-05-16 ENCOUNTER — Other Ambulatory Visit: Payer: Self-pay | Admitting: Physician Assistant

## 2012-05-17 ENCOUNTER — Telehealth: Payer: Self-pay

## 2012-05-17 NOTE — Telephone Encounter (Signed)
PATIENT WENT TO PICK UP HER REFILLS OF HER MEDICINE, BUT PHARMACY TOLD HE WE WOULD NOT FILL THE AMBIEN.  SHE WAS HERE RECENTLY AND WANTS TO KNOW WHY IT WASN'T FILLED.  SAYS SHE MUST HAVE IT TO SLEEP.

## 2012-05-18 ENCOUNTER — Other Ambulatory Visit (HOSPITAL_BASED_OUTPATIENT_CLINIC_OR_DEPARTMENT_OTHER): Payer: Medicare Other | Admitting: Lab

## 2012-05-18 ENCOUNTER — Ambulatory Visit (HOSPITAL_BASED_OUTPATIENT_CLINIC_OR_DEPARTMENT_OTHER): Payer: Medicare Other | Admitting: Hematology & Oncology

## 2012-05-18 VITALS — BP 133/65 | HR 82 | Temp 97.2°F | Ht 60.0 in | Wt 116.0 lb

## 2012-05-18 DIAGNOSIS — N289 Disorder of kidney and ureter, unspecified: Secondary | ICD-10-CM

## 2012-05-18 DIAGNOSIS — D631 Anemia in chronic kidney disease: Secondary | ICD-10-CM

## 2012-05-18 DIAGNOSIS — I10 Essential (primary) hypertension: Secondary | ICD-10-CM | POA: Diagnosis not present

## 2012-05-18 DIAGNOSIS — D509 Iron deficiency anemia, unspecified: Secondary | ICD-10-CM

## 2012-05-18 DIAGNOSIS — D649 Anemia, unspecified: Secondary | ICD-10-CM | POA: Diagnosis not present

## 2012-05-18 DIAGNOSIS — N189 Chronic kidney disease, unspecified: Secondary | ICD-10-CM

## 2012-05-18 LAB — CBC WITH DIFFERENTIAL (CANCER CENTER ONLY)
BASO#: 0 10*3/uL (ref 0.0–0.2)
BASO%: 0.5 % (ref 0.0–2.0)
EOS%: 0.8 % (ref 0.0–7.0)
Eosinophils Absolute: 0.1 10*3/uL (ref 0.0–0.5)
HCT: 33.1 % — ABNORMAL LOW (ref 34.8–46.6)
HGB: 10.9 g/dL — ABNORMAL LOW (ref 11.6–15.9)
LYMPH#: 1 10*3/uL (ref 0.9–3.3)
LYMPH%: 16.7 % (ref 14.0–48.0)
MCH: 30.7 pg (ref 26.0–34.0)
MCHC: 32.9 g/dL (ref 32.0–36.0)
MCV: 93 fL (ref 81–101)
MONO#: 0.7 10*3/uL (ref 0.1–0.9)
MONO%: 10.9 % (ref 0.0–13.0)
NEUT#: 4.3 10*3/uL (ref 1.5–6.5)
NEUT%: 71.1 % (ref 39.6–80.0)
Platelets: 323 10*3/uL (ref 145–400)
RBC: 3.55 10*6/uL — ABNORMAL LOW (ref 3.70–5.32)
RDW: 19 % — ABNORMAL HIGH (ref 11.1–15.7)
WBC: 6.1 10*3/uL (ref 3.9–10.0)

## 2012-05-18 LAB — IRON AND TIBC
%SAT: 30 % (ref 20–55)
Iron: 89 ug/dL (ref 42–145)
TIBC: 294 ug/dL (ref 250–470)
UIBC: 205 ug/dL (ref 125–400)

## 2012-05-18 LAB — RETICULOCYTES (CHCC)
ABS Retic: 81.9 10*3/uL (ref 19.0–186.0)
RBC.: 3.56 MIL/uL — ABNORMAL LOW (ref 3.87–5.11)
Retic Ct Pct: 2.3 % (ref 0.4–2.3)

## 2012-05-18 LAB — FERRITIN: Ferritin: 247 ng/mL (ref 10–291)

## 2012-05-18 NOTE — Progress Notes (Signed)
Addended by: Burney Gauze R on: 05/18/2012 05:59 PM   Modules accepted: Orders

## 2012-05-18 NOTE — Telephone Encounter (Signed)
On 05/04/2012 Dr. [name] Rx'd #30 with 5 refills.  Please clarify with pharmacy and notify the patient.

## 2012-05-18 NOTE — Progress Notes (Signed)
This office note has been dictated.

## 2012-05-19 ENCOUNTER — Other Ambulatory Visit: Payer: Self-pay | Admitting: Physician Assistant

## 2012-05-19 NOTE — Telephone Encounter (Signed)
Pt is at [facility], looking for refill on her ambien. Per chart this looks like it was filled 5/10 but I confirmed with the pharmacist that they received other refills from [place] but not for the [facility]

## 2012-05-19 NOTE — Progress Notes (Signed)
CC:   Lina Sayre. [name], M.D. [name]. [name], M.D.  DIAGNOSIS:  Iron deficiency anemia.  CURRENT THERAPY:  Patient status post IV iron with Feraheme at 1020 mg on 04/20/2012.  INTERIM HISTORY:  Ms. Derogatis comes in for followup.  She was seen initially back on 04/16.  At that point in time, we found that she was iron deficient.  She had a ferritin of 11.  Iron saturation was 13%. Other anemia studies that were done showed negative monoclonal spike. She had normal light chains.  Her erythropoietin level was 13.3.  I thought we could probably hold off on giving her any Aranesp. We gave her  iron.  As expected, her blood count has started to come up. She feels a little better.  She still takes her medication for cholesterol and hypertension.  She also has gout.  She has had no obvious bleeding.  Of note, she has only 1 kidney.  She had a kidney cancer back 10 years ago.  She has not noted any change in appetite.  There has been no weight loss or weight gain.  She has had no fevers, sweats or chills.  PHYSICAL EXAMINATION:  This is a petite, elderly appearing white female in no obvious distress.  Vital signs:  97.2, pulse 82, respiratory rate 18, blood pressure 133/65.  Weight is 116.  Head and neck: Normocephalic, atraumatic skull.  There are no ocular or oral lesions. There are no palpable cervical or supraclavicular lymph nodes.  Lungs: Clear bilaterally.  Cardiac:  Regular rate and rhythm with a normal S1 and S2.  There are no murmurs, rubs or bruits.  Abdomen:  Soft with good bowel sounds.  There is a well-healed right nephrectomy scar.  There is no tenderness to abdominal palpation.  There is no fluid wave.  There is no palpable hepatosplenomegaly. Extremities:  No clubbing, cyanosis or edema.  She has osteoarthritic changes in her joints.  Neurologic:  No focal neurological deficits.  Skin:  No rashes, ecchymosis or petechia.  LABORATORY STUDIES:  White cell count is 6.1,  hemoglobin 11, hematocrit 33.1, platelet count 323.  MCV is 93.  IMPRESSION:  Ms. Musich is a 77 year old white female with iron deficiency anemia.  She also has some anemia of renal insufficiency by her low erythropoietin level.  She is responding to the iron by itself.  I do feel we have to follow her along.  I want to see her back in 2 more months.  I told Ms. Umanzor that if her blood count does not continue to improve, then we might want to consider giving her an Aranesp injection, which will help stimulate her bone marrow.    ______________________________ Volanda Napoleon, M.D. PRE/MEDQ  D:  05/18/2012  T:  05/19/2012  Job:  [id number]

## 2012-05-20 NOTE — Telephone Encounter (Signed)
Spoke with pt everything taken care as far as her refills.

## 2012-05-22 ENCOUNTER — Telehealth: Payer: Self-pay

## 2012-05-22 NOTE — Telephone Encounter (Signed)
PT STATES SHE HAD GONE TO [facility] AND GOT HER AMBIEN, SHE THOUGHT SHE WAS SUPPOSE TO HAVE 5 REFILLS,BUT THE BOTTLE DIDN'T SAY SO. WOULD LIKE TO MAKE SURE SHE DOES HAVE 5 REFILLS. PLEASE CALL PT AT [phone] OR HER CELL AT [phone]   [facility]

## 2012-05-23 NOTE — Telephone Encounter (Signed)
The authorization was from a PA, so it's only for 30 days.  The physician can authorize up to 5 refills. I'm forwarding this to Dr. [name] (he saw the patient 5/10) for his review and additional authorization if he agrees.

## 2012-05-24 NOTE — Telephone Encounter (Signed)
He c.Thanksan put 5 refills under my name

## 2012-05-25 NOTE — Telephone Encounter (Signed)
Called [employer] [facility] and put 5 RFs on pt's Ambien Rx under Dr Perfecto Kingdom name. Notified pt.

## 2012-06-07 ENCOUNTER — Ambulatory Visit: Payer: Medicare Other

## 2012-06-09 ENCOUNTER — Ambulatory Visit: Payer: Medicare Other

## 2012-06-09 ENCOUNTER — Ambulatory Visit (INDEPENDENT_AMBULATORY_CARE_PROVIDER_SITE_OTHER): Payer: Medicare Other | Admitting: Emergency Medicine

## 2012-06-09 VITALS — BP 125/68 | HR 84 | Temp 97.8°F | Resp 16 | Ht 65.5 in | Wt 113.6 lb

## 2012-06-09 DIAGNOSIS — M549 Dorsalgia, unspecified: Secondary | ICD-10-CM

## 2012-06-09 DIAGNOSIS — M25552 Pain in left hip: Secondary | ICD-10-CM

## 2012-06-09 DIAGNOSIS — M25559 Pain in unspecified hip: Secondary | ICD-10-CM

## 2012-06-09 DIAGNOSIS — M25551 Pain in right hip: Secondary | ICD-10-CM

## 2012-06-09 MED ORDER — CYCLOBENZAPRINE HCL 5 MG PO TABS: ORAL_TABLET | ORAL | Status: DC

## 2012-06-09 MED ORDER — GABAPENTIN 100 MG PO CAPS: ORAL_CAPSULE | ORAL | Status: DC

## 2012-06-09 NOTE — Patient Instructions (Signed)

## 2012-06-09 NOTE — Progress Notes (Signed)
  Subjective:    Patient ID: Gabrielle [name], female    DOB: [birth year], 77 y.o.   MRN: [id number]  HPI patient enters with pain in her back and in her right buttocks and down the right leg. Rarely she has to do a lot of walking on last Saturday which is one week ago. After that time she started having a lot of discomfort in her right hip which radiates down the right side of her leg.    Review of Systems     Objective:   Physical Exam there is mild tenderness in the right SI joint. There is tenderness in the right sciatic area with pain on straight leg raising. She does have 2+ deep tendon reflexes in both knees she has fair range of motion in both hips  [facility] reading (PRIMARY) by  Dr.Eshan Trupiano is degenerative disc disease at L4-L5 and L5-S1. There is also a calcific density adjacent to L4 consistent with a kidney stone.        Assessment & Plan:  Patient here with back and leg pain. Most of the pain is in the right he buttocks and down the right leg. We'll go ahead and proceed with hip and back films.

## 2012-06-10 ENCOUNTER — Telehealth: Payer: Self-pay

## 2012-06-10 NOTE — Telephone Encounter (Signed)
She was given 2 meds: Flexeril (cyclobenzaprine) and Neurontin (gabapentin).  Both can cause the symptoms she describes.  Can she tell which it is?  If not, Continue the Flexeril at bedtime and the Neurontin, but only at bedtime.  If she tolerates that, then add the morning dose of Neurontin.

## 2012-06-10 NOTE — Telephone Encounter (Signed)
PT STATES THAT SHE IS HAVING A REACTION TO THE MEDICATION THAT SHE WAS PRESCRIBED YESTERDAY, PT IS EXPERIENCING FATIGUE, DRY MOUTH, AND SLIGHT DIZZINESS, PLEASE ADVISE.

## 2012-06-10 NOTE — Telephone Encounter (Signed)
She said she was told not to take the Gabapentin in the past due to low kidney function, so she did not take it. She has taken Flexeril only, and it caused the fatigue and dizziness. She wants to know if she can take something else in place of this medication. She indicated if we call back tomorrow to use cell # [phone]

## 2012-06-11 ENCOUNTER — Other Ambulatory Visit: Payer: Self-pay | Admitting: *Deleted

## 2012-06-11 NOTE — Telephone Encounter (Signed)
She can try robaxin 500 mg 1 PO QID prn, #40 no RF

## 2012-06-11 NOTE — Telephone Encounter (Signed)
Patient not willing to take robaxin, due to side effects. She is wondering if there is anything she can take due to her med sensitivity.

## 2012-06-12 DIAGNOSIS — I10 Essential (primary) hypertension: Secondary | ICD-10-CM | POA: Diagnosis not present

## 2012-06-12 DIAGNOSIS — N183 Chronic kidney disease, stage 3 unspecified: Secondary | ICD-10-CM | POA: Diagnosis not present

## 2012-06-12 DIAGNOSIS — N2581 Secondary hyperparathyroidism of renal origin: Secondary | ICD-10-CM | POA: Diagnosis not present

## 2012-06-12 DIAGNOSIS — D649 Anemia, unspecified: Secondary | ICD-10-CM | POA: Diagnosis not present

## 2012-06-12 NOTE — Telephone Encounter (Signed)
LMOM to CB. 

## 2012-06-12 NOTE — Telephone Encounter (Signed)
Call patient and tell her it appears that the only medication she can tolerate his Tylenol. If she continues to have problems we might consider a physical therapy evaluation and see if they can help her.

## 2012-06-13 ENCOUNTER — Telehealth: Payer: Self-pay | Admitting: Family Medicine

## 2012-06-13 NOTE — Telephone Encounter (Signed)
Patient spoke with Dr. Posey Pronto yesturday and he said she could take the gabapentin so she started taking it.  It does make her sleepy but she will continue to try it

## 2012-06-19 ENCOUNTER — Ambulatory Visit (INDEPENDENT_AMBULATORY_CARE_PROVIDER_SITE_OTHER): Payer: Medicare Other | Admitting: Emergency Medicine

## 2012-06-19 VITALS — BP 126/71 | HR 92 | Temp 98.4°F | Resp 20 | Ht 61.0 in | Wt 111.0 lb

## 2012-06-19 DIAGNOSIS — E119 Type 2 diabetes mellitus without complications: Secondary | ICD-10-CM

## 2012-06-19 DIAGNOSIS — M539 Dorsopathy, unspecified: Secondary | ICD-10-CM

## 2012-06-19 DIAGNOSIS — M549 Dorsalgia, unspecified: Secondary | ICD-10-CM

## 2012-06-19 LAB — GLUCOSE, POCT (MANUAL RESULT ENTRY): POC Glucose: 184 mg/dl — AB (ref 70–99)

## 2012-06-19 MED ORDER — GLIPIZIDE 5 MG PO TABS: ORAL_TABLET | ORAL | Status: DC

## 2012-06-19 NOTE — Patient Instructions (Signed)
Gabapentin one tablet at bedtime

## 2012-06-19 NOTE — Progress Notes (Signed)
  Subjective:    Patient ID: Gabrielle [name], female    DOB: September 18, 1935, 77 y.o.   MRN: [id number]  HPI patient states she continues to have severe pain in her back and into both hips. She states the Flexeril and gabapentin make her much too sleepy. She states she cannot take these medications. She also says she cannot take the glipizide extended release and can only take the short-acting form of that drug.    Review of Systems     Objective:   Physical Exam H. EENT exam unremarkable. Neck supple the chest is clear to heart regular rate no murmurs. There is tenderness over the lower lumbar spine. Straight leg raising was negative there 2+ knee reflexes bilaterally but no ankle reflexes.  Results for orders placed in visit on 06/19/12  GLUCOSE, POCT (MANUAL RESULT ENTRY)      Component Value Range   POC Glucose 184 (*) 70 - 99 mg/dl        Assessment & Plan:  She is going to take Neurontin 100 mg one at night. She will not be taking any Flexeril. She will not be taking any Neurontin during the day. Take Tylenol for pain. I have scheduled her for an MRI of her LS-spine. That will help tell us if she needs to see a back specialist or a hip specialist regarding the pain she is experiencing. Also wrote her for glipizide 5 mg not extended release to take one half tablet a day for sugar.

## 2012-06-21 ENCOUNTER — Encounter: Payer: Self-pay | Admitting: Family Medicine

## 2012-06-23 ENCOUNTER — Ambulatory Visit
Admission: RE | Admit: 2012-06-23 | Discharge: 2012-06-23 | Disposition: A | Discharge status: Home / Self Care | Payer: Medicare Other | Source: Ambulatory Visit | Attending: Emergency Medicine | Admitting: Emergency Medicine

## 2012-06-23 DIAGNOSIS — M5126 Other intervertebral disc displacement, lumbar region: Secondary | ICD-10-CM | POA: Diagnosis not present

## 2012-06-23 DIAGNOSIS — M7130 Other bursal cyst, unspecified site: Secondary | ICD-10-CM | POA: Diagnosis not present

## 2012-06-23 DIAGNOSIS — M47817 Spondylosis without myelopathy or radiculopathy, lumbosacral region: Secondary | ICD-10-CM | POA: Diagnosis not present

## 2012-06-23 DIAGNOSIS — M549 Dorsalgia, unspecified: Secondary | ICD-10-CM

## 2012-06-25 ENCOUNTER — Telehealth: Payer: Self-pay

## 2012-06-26 ENCOUNTER — Other Ambulatory Visit: Payer: Self-pay | Admitting: Emergency Medicine

## 2012-06-26 DIAGNOSIS — M4800 Spinal stenosis, site unspecified: Secondary | ICD-10-CM

## 2012-07-18 ENCOUNTER — Ambulatory Visit (HOSPITAL_BASED_OUTPATIENT_CLINIC_OR_DEPARTMENT_OTHER): Payer: Medicare Other | Admitting: Hematology & Oncology

## 2012-07-18 ENCOUNTER — Other Ambulatory Visit (HOSPITAL_BASED_OUTPATIENT_CLINIC_OR_DEPARTMENT_OTHER): Payer: Medicare Other | Admitting: Lab

## 2012-07-18 VITALS — BP 119/55 | HR 80 | Temp 97.0°F | Ht 61.0 in | Wt 112.0 lb

## 2012-07-18 DIAGNOSIS — Z862 Personal history of diseases of the blood and blood-forming organs and certain disorders involving the immune mechanism: Secondary | ICD-10-CM

## 2012-07-18 DIAGNOSIS — M48062 Spinal stenosis, lumbar region with neurogenic claudication: Secondary | ICD-10-CM

## 2012-07-18 DIAGNOSIS — D509 Iron deficiency anemia, unspecified: Secondary | ICD-10-CM | POA: Diagnosis not present

## 2012-07-18 DIAGNOSIS — D631 Anemia in chronic kidney disease: Secondary | ICD-10-CM

## 2012-07-18 LAB — IRON AND TIBC
%SAT: 33 % (ref 20–55)
Iron: 89 ug/dL (ref 42–145)
TIBC: 272 ug/dL (ref 250–470)
UIBC: 183 ug/dL (ref 125–400)

## 2012-07-18 LAB — CBC WITH DIFFERENTIAL (CANCER CENTER ONLY)
BASO#: 0 10*3/uL (ref 0.0–0.2)
BASO%: 0.3 % (ref 0.0–2.0)
EOS%: 0.3 % (ref 0.0–7.0)
Eosinophils Absolute: 0 10*3/uL (ref 0.0–0.5)
HCT: 34.6 % — ABNORMAL LOW (ref 34.8–46.6)
HGB: 11.9 g/dL (ref 11.6–15.9)
LYMPH#: 0.9 10*3/uL (ref 0.9–3.3)
LYMPH%: 13.6 % — ABNORMAL LOW (ref 14.0–48.0)
MCH: 32.8 pg (ref 26.0–34.0)
MCHC: 34.4 g/dL (ref 32.0–36.0)
MCV: 95 fL (ref 81–101)
MONO#: 0.5 10*3/uL (ref 0.1–0.9)
MONO%: 7.9 % (ref 0.0–13.0)
NEUT#: 5 10*3/uL (ref 1.5–6.5)
NEUT%: 77.9 % (ref 39.6–80.0)
Platelets: 318 10*3/uL (ref 145–400)
RBC: 3.63 10*6/uL — ABNORMAL LOW (ref 3.70–5.32)
RDW: 15.1 % (ref 11.1–15.7)
WBC: 6.5 10*3/uL (ref 3.9–10.0)

## 2012-07-18 LAB — RETICULOCYTES (CHCC)
ABS Retic: 51.7 10*3/uL (ref 19.0–186.0)
RBC.: 3.69 MIL/uL — ABNORMAL LOW (ref 3.87–5.11)
Retic Ct Pct: 1.4 % (ref 0.4–2.3)

## 2012-07-18 LAB — FERRITIN: Ferritin: 69 ng/mL (ref 10–291)

## 2012-07-18 NOTE — Progress Notes (Signed)
This office note has been dictated.

## 2012-07-19 NOTE — Progress Notes (Signed)
CC:   Gabrielle [name]. Gabrielle [name], M.D. Gabrielle [name], M.D. Gabrielle [name]. Gabrielle [name], M.D.  DIAGNOSES: 1. Iron-deficiency anemia. 2. Severe spinal stenosis at L3-4.  CURRENT THERAPY:  IV iron with Feraheme, administered 04/20/2012.  INTERIM HISTORY:  Gabrielle [name] comes in for followup.  Her main problem now is back.  She is having symptoms consistent with spinal stenosis. This is mostly on the right.  She had an MRI done a week or so ago. This did show severe spinal stenosis at L3-4.  Also noted was a synovial cyst pressing in on the L4 nerve root.  She has an appointment with Dr. Sherwood [name] in a few weeks.  She has done well with the iron.  Her hemoglobin has responded very nicely.  When we last checked her ferritin, it was 247 back in May.  She really does not feel too tired.  She is eating okay.  She is having no nausea or vomiting  she is having no fevers, sweats, or chills. There is no change in bowel or bladder habits.  PHYSICAL EXAMINATION:  General:  This is a well-developed, well- nourished elderly female in no obvious distress.  Vital Signs:  Show a temperature of 97.6, pulse 80, respiratory rate 20, blood pressure 119/55, weight is 112.  Head and Neck Exam:  Shows a normocephalic, atraumatic skull.  There are no ocular or oral lesions.  There are no palpable cervical or supraclavicular lymph nodes.  Lungs:  Clear bilaterally.  Cardiac Exam:  Regular rate and rhythm with a normal S1 and S2.  There are no murmurs, rubs, or bruits.  Abdominal Exam:  Soft with good bowel sounds.  There is no palpable abdominal mass.  There is no palpable hepatosplenomegaly.  Back Exam:  Does show laminectomy scar in the lumbar spine.  No muscle spasms noted in the lower spine. Extremities:  Show no clubbing, cyanosis, or edema.  Neurological Exam: Shows no focal neurological deficits.  LABORATORY STUDIES:  White cell count 6.5, hemoglobin 11.9, hematocrit 34.6, platelet count is 318.  IMPRESSION:  Ms.  [name] is a 77 year old white female with a history of iron deficiency anemia.  We have corrected this.  We will plan to see her back in 3 months' time.  By then, one would think that she probably would have had surgery for the spinal stenosis.  She is quite symptomatic from this.  From a hematologic point of view, I do not see any issues that she should have.    ______________________________ Volanda Napoleon, M.D. PRE/MEDQ  D:  07/18/2012  T:  07/19/2012  Job:  [id number]

## 2012-08-03 ENCOUNTER — Other Ambulatory Visit: Payer: Self-pay | Admitting: Neurosurgery

## 2012-08-03 DIAGNOSIS — M545 Low back pain, unspecified: Secondary | ICD-10-CM | POA: Diagnosis not present

## 2012-08-03 DIAGNOSIS — I739 Peripheral vascular disease, unspecified: Secondary | ICD-10-CM | POA: Diagnosis not present

## 2012-08-03 DIAGNOSIS — I70219 Atherosclerosis of native arteries of extremities with intermittent claudication, unspecified extremity: Secondary | ICD-10-CM | POA: Diagnosis not present

## 2012-08-20 DIAGNOSIS — I739 Peripheral vascular disease, unspecified: Secondary | ICD-10-CM | POA: Diagnosis not present

## 2012-08-20 DIAGNOSIS — I10 Essential (primary) hypertension: Secondary | ICD-10-CM | POA: Diagnosis not present

## 2012-08-20 DIAGNOSIS — I6529 Occlusion and stenosis of unspecified carotid artery: Secondary | ICD-10-CM | POA: Diagnosis not present

## 2012-08-20 DIAGNOSIS — E782 Mixed hyperlipidemia: Secondary | ICD-10-CM | POA: Diagnosis not present

## 2012-08-21 ENCOUNTER — Encounter (HOSPITAL_COMMUNITY): Payer: Self-pay | Admitting: Respiratory Therapy

## 2012-08-22 ENCOUNTER — Telehealth: Payer: Self-pay

## 2012-08-22 NOTE — Telephone Encounter (Signed)
Pt calling she states she called in a rx refill on her amlodipane and the pharmacy would not fill because they have her instructions to take a 1/2 instead of a pill a day, she would like for someone to call pharmacy to straighten this out.[phone]

## 2012-08-23 MED ORDER — AMLODIPINE BESYLATE 5 MG PO TABS: 5.0000 mg | ORAL_TABLET | Freq: Every day | ORAL | Status: DC

## 2012-08-23 NOTE — Telephone Encounter (Signed)
It would be fine to correct a prescription to give her amlodipine 5 mg one a day #90 with 3 refills

## 2012-08-23 NOTE — Telephone Encounter (Signed)
Sending over new Rx. Pt aware we are correcting this

## 2012-08-23 NOTE — Telephone Encounter (Signed)
Called pt who reported that for some reason when she got her last RF of amlodipine the sig was for 1/2 tab QD instead of the 1 tab QD she has taken for years. She didn't even realize until she tried to get the next RF and ins wouldn't cover yet bc she ran out after two weeks. She requests that we call pharm and get it changed to correct Rx and send a 90 day supply if her ins will pay for it. Called [facility] and they reported that when they got the Rx on 05/04/12, sig was take 0.5 tab QD and had 11 RFs. Dr [name], did you intend to decrease her amlodipine (I don't see any notes to that effect and pt reports she was not aware of a plan to dec), or can I send in a corrected Rx for Amlodipine 5 mg QD #90 w/2 RFs (to last her the rest of the year)?

## 2012-08-30 ENCOUNTER — Inpatient Hospital Stay (HOSPITAL_COMMUNITY): Admission: RE | Admit: 2012-08-30 | Payer: Medicare Other | Source: Ambulatory Visit

## 2012-08-31 ENCOUNTER — Ambulatory Visit (INDEPENDENT_AMBULATORY_CARE_PROVIDER_SITE_OTHER): Payer: Medicare Other | Admitting: Family Medicine

## 2012-08-31 DIAGNOSIS — Z23 Encounter for immunization: Secondary | ICD-10-CM

## 2012-08-31 NOTE — Addendum Note (Signed)
Addended by: Ocie Doyne on: 08/31/2012 12:31 PM   Modules accepted: Level of Service

## 2012-09-05 ENCOUNTER — Encounter (HOSPITAL_COMMUNITY): Admission: RE | Payer: Self-pay | Source: Ambulatory Visit

## 2012-09-05 ENCOUNTER — Inpatient Hospital Stay (HOSPITAL_COMMUNITY): Admission: RE | Admit: 2012-09-05 | Payer: Medicare Other | Source: Ambulatory Visit | Admitting: Neurosurgery

## 2012-09-05 SURGERY — LUMBAR LAMINECTOMY/DECOMPRESSION MICRODISCECTOMY 1 LEVEL
Anesthesia: General | Laterality: Bilateral

## 2012-09-15 ENCOUNTER — Ambulatory Visit (INDEPENDENT_AMBULATORY_CARE_PROVIDER_SITE_OTHER): Payer: Medicare Other | Admitting: Emergency Medicine

## 2012-09-15 ENCOUNTER — Other Ambulatory Visit: Payer: Self-pay

## 2012-09-15 ENCOUNTER — Emergency Department (HOSPITAL_COMMUNITY): Payer: Medicare Other

## 2012-09-15 ENCOUNTER — Emergency Department (HOSPITAL_COMMUNITY)
Admission: EM | Admit: 2012-09-15 | Discharge: 2012-09-15 | Disposition: A | Discharge status: Home / Self Care | Payer: Medicare Other | Attending: Emergency Medicine | Admitting: Emergency Medicine

## 2012-09-15 VITALS — BP 130/86 | HR 78 | Temp 97.6°F | Resp 16 | Wt 112.0 lb

## 2012-09-15 DIAGNOSIS — G4700 Insomnia, unspecified: Secondary | ICD-10-CM

## 2012-09-15 DIAGNOSIS — R5383 Other fatigue: Secondary | ICD-10-CM

## 2012-09-15 DIAGNOSIS — M6281 Muscle weakness (generalized): Secondary | ICD-10-CM | POA: Diagnosis not present

## 2012-09-15 DIAGNOSIS — Z7982 Long term (current) use of aspirin: Secondary | ICD-10-CM | POA: Insufficient documentation

## 2012-09-15 DIAGNOSIS — R5381 Other malaise: Secondary | ICD-10-CM | POA: Diagnosis not present

## 2012-09-15 DIAGNOSIS — Z79899 Other long term (current) drug therapy: Secondary | ICD-10-CM | POA: Diagnosis not present

## 2012-09-15 DIAGNOSIS — I10 Essential (primary) hypertension: Secondary | ICD-10-CM | POA: Insufficient documentation

## 2012-09-15 DIAGNOSIS — R55 Syncope and collapse: Secondary | ICD-10-CM | POA: Insufficient documentation

## 2012-09-15 DIAGNOSIS — R404 Transient alteration of awareness: Secondary | ICD-10-CM | POA: Diagnosis not present

## 2012-09-15 DIAGNOSIS — R42 Dizziness and giddiness: Secondary | ICD-10-CM | POA: Diagnosis not present

## 2012-09-15 DIAGNOSIS — R29898 Other symptoms and signs involving the musculoskeletal system: Secondary | ICD-10-CM | POA: Diagnosis not present

## 2012-09-15 DIAGNOSIS — E119 Type 2 diabetes mellitus without complications: Secondary | ICD-10-CM | POA: Diagnosis not present

## 2012-09-15 DIAGNOSIS — Z8673 Personal history of transient ischemic attack (TIA), and cerebral infarction without residual deficits: Secondary | ICD-10-CM | POA: Insufficient documentation

## 2012-09-15 DIAGNOSIS — G8190 Hemiplegia, unspecified affecting unspecified side: Secondary | ICD-10-CM | POA: Diagnosis not present

## 2012-09-15 LAB — POCT CBC
Granulocyte percent: 82.2 %G — AB (ref 37–80)
HCT, POC: 34.3 % — AB (ref 37.7–47.9)
Hemoglobin: 10.5 g/dL — AB (ref 12.2–16.2)
Lymph, poc: 0.9 (ref 0.6–3.4)
MCH, POC: 30.9 pg (ref 27–31.2)
MCHC: 30.6 g/dL — AB (ref 31.8–35.4)
MCV: 101 fL — AB (ref 80–97)
MID (cbc): 0.4 (ref 0–0.9)
MPV: 10.4 fL (ref 0–99.8)
POC Granulocyte: 5.7 (ref 2–6.9)
POC LYMPH PERCENT: 12.7 %L (ref 10–50)
POC MID %: 5.1 %M (ref 0–12)
Platelet Count, POC: 374 10*3/uL (ref 142–424)
RBC: 3.4 M/uL — AB (ref 4.04–5.48)
RDW, POC: 14.5 %
WBC: 6.9 10*3/uL (ref 4.6–10.2)

## 2012-09-15 LAB — DIFFERENTIAL
Basophils Absolute: 0 10*3/uL (ref 0.0–0.1)
Basophils Relative: 0 % (ref 0–1)
Eosinophils Absolute: 0 10*3/uL (ref 0.0–0.7)
Eosinophils Relative: 0 % (ref 0–5)
Lymphocytes Relative: 6 % — ABNORMAL LOW (ref 12–46)
Lymphs Abs: 0.5 10*3/uL — ABNORMAL LOW (ref 0.7–4.0)
Monocytes Absolute: 0.3 10*3/uL (ref 0.1–1.0)
Monocytes Relative: 3 % (ref 3–12)
Neutro Abs: 7.8 10*3/uL — ABNORMAL HIGH (ref 1.7–7.7)
Neutrophils Relative %: 91 % — ABNORMAL HIGH (ref 43–77)

## 2012-09-15 LAB — COMPREHENSIVE METABOLIC PANEL
ALT: 7 U/L (ref 0–35)
AST: 15 U/L (ref 0–37)
Albumin: 4.1 g/dL (ref 3.5–5.2)
Alkaline Phosphatase: 67 U/L (ref 39–117)
BUN: 28 mg/dL — ABNORMAL HIGH (ref 6–23)
CO2: 22 mEq/L (ref 19–32)
Calcium: 9.3 mg/dL (ref 8.4–10.5)
Chloride: 101 mEq/L (ref 96–112)
Creatinine, Ser: 1.18 mg/dL — ABNORMAL HIGH (ref 0.50–1.10)
GFR calc Af Amer: 51 mL/min — ABNORMAL LOW (ref >90)
GFR calc non Af Amer: 44 mL/min — ABNORMAL LOW (ref >90)
Glucose, Bld: 115 mg/dL — ABNORMAL HIGH (ref 70–99)
Potassium: 3.7 mEq/L (ref 3.5–5.1)
Sodium: 137 mEq/L (ref 135–145)
Total Bilirubin: 0.4 mg/dL (ref 0.3–1.2)
Total Protein: 6.8 g/dL (ref 6.0–8.3)

## 2012-09-15 LAB — GLUCOSE, CAPILLARY: Glucose-Capillary: 115 mg/dL — ABNORMAL HIGH (ref 70–99)

## 2012-09-15 LAB — PROTIME-INR
INR: 0.99 (ref 0.00–1.49)
Prothrombin Time: 13 seconds (ref 11.6–15.2)

## 2012-09-15 LAB — CBC
HCT: 32.6 % — ABNORMAL LOW (ref 36.0–46.0)
Hemoglobin: 11.1 g/dL — ABNORMAL LOW (ref 12.0–15.0)
MCH: 32.8 pg (ref 26.0–34.0)
MCHC: 34 g/dL (ref 30.0–36.0)
MCV: 96.4 fL (ref 78.0–100.0)
Platelets: 350 10*3/uL (ref 150–400)
RBC: 3.38 MIL/uL — ABNORMAL LOW (ref 3.87–5.11)
RDW: 13.7 % (ref 11.5–15.5)
WBC: 8.6 10*3/uL (ref 4.0–10.5)

## 2012-09-15 LAB — GLUCOSE, POCT (MANUAL RESULT ENTRY): POC Glucose: 126 mg/dl — AB (ref 70–99)

## 2012-09-15 LAB — TROPONIN I: Troponin I: <0.3 ng/mL (ref <0.30)

## 2012-09-15 LAB — APTT: aPTT: 28 seconds (ref 24–37)

## 2012-09-15 MED ORDER — MECLIZINE HCL 25 MG PO TABS: 25.0000 mg | ORAL_TABLET | Freq: Three times a day (TID) | ORAL | Status: DC | PRN

## 2012-09-15 MED ORDER — MECLIZINE HCL 25 MG PO TABS
25.0000 mg | ORAL_TABLET | Freq: Once | ORAL | Status: AC
  Administered 2012-09-15: 25 mg via ORAL
  Filled 2012-09-15: qty 1

## 2012-09-15 NOTE — Progress Notes (Signed)
  Subjective:    Patient ID: Gabrielle [name], female    DOB: [birth year], 77 y.o.   MRN: [id number]  HPI patient states she felt okay this morning but while walking around at the grocery store she had an episode where she felt very weak in her arms and legs. She was able to get home and then while unloading groceries again experienced extreme weakness in her arms and legs with difficulty walking and difficulty lifting her legs. She denies any chest pain or shortness of breath she states she feels incredibly fatigued. She did not sleep well last night but does take Ambien 10 mg at night. She does have a history of previous CVA history of anemia diabetes and high cholesterol. She has had seizures in the past    Review of Systems     Objective:   Physical Exam patient looks extremely tired and fatigued. She needed assistance getting to the exam room. She was unable to sit up and had to lay down. Her chest was clear to auscultation her cardiac exam revealed a regular rate without murmurs the abdomen was soft. She has 4/5 weakness in her arms and legs but no distinct focal weakness. Cranial nerves were intact.  EKG no acute changes  Results for orders placed in visit on 09/15/12  GLUCOSE, POCT (MANUAL RESULT ENTRY)      Component Value Range   POC Glucose 126 (*) 70 - 99 mg/dl  POCT CBC      Component Value Range   WBC 6.9  4.6 - 10.2 K/uL   Lymph, poc 0.9  0.6 - 3.4   POC LYMPH PERCENT 12.7  10 - 50 %L   MID (cbc) 0.4  0 - 0.9   POC MID % 5.1  0 - 12 %M   POC Granulocyte 5.7  2 - 6.9   Granulocyte percent 82.2 (*) 37 - 80 %G   RBC 3.40 (*) 4.04 - 5.48 M/uL   Hemoglobin 10.5 (*) 12.2 - 16.2 g/dL   HCT, POC 34.3 (*) 37.7 - 47.9 %   MCV 101.0 (*) 80 - 97 fL   MCH, POC 30.9  27 - 31.2 pg   MCHC 30.6 (*) 31.8 - 35.4 g/dL   RDW, POC 14.5     Platelet Count, POC 374  142 - 424 K/uL   MPV 10.4  0 - 99.8 fL      Assessment & Plan:  Apparently she has been taking 10 mg of Ambien at night  instead of 5 what she should be on for sleep she has a history of anemia but recent hemoglobins have been stable. We'll go ahead and put a line in check CBC and glucose in transport to the hospital for further evaluation. EKG done was normal. I am somewhat worried about vertebrobasilar insufficiency in that the patient feels a funny sensation around her lips along with this severe fatigue in her arms and legs. Certainly she could have some type of metabolic disorder that is causing this but the acute onset would be more suspicious for a vertebrobasilar insufficiency . She does have a hemoglobin of 10.5 and question whether this may be related in some ways. She denies any hematochezia or melena.

## 2012-09-15 NOTE — ED Notes (Signed)
Pt alert and oriented states she feels dizzy and near syncope. She says she has had to receive Iron IV in the past for anemia and this feels her symptoms feel the same. Pt says she is about due to follow up with the MD for anemia again.  BP 148/60. No pain noted. Pt lives at home with husband.

## 2012-09-15 NOTE — Patient Instructions (Addendum)
Be sure you do not take 10 mg of Ambien at night. The maximum amount of Ambien you can take at night his 5 mg.

## 2012-09-15 NOTE — ED Provider Notes (Signed)
History     CSN: [id number]  Arrival date & time 09/15/12  1331   First MD Initiated Contact with Patient 09/15/12 1408      Chief Complaint  Patient presents with  . Near Syncope     Patient is a 77 y.o. female presenting with weakness. The history is provided by the patient.  Weakness The primary symptoms include dizziness. Primary symptoms do not include headaches, syncope, loss of consciousness, altered mental status, seizures, visual change, paresthesias, focal weakness, speech change, fever, nausea or vomiting. Episode onset: when she woke up this am. The symptoms are unchanged. The neurological symptoms are diffuse. The symptoms occurred after standing up.  Dizziness also occurs with weakness. Dizziness does not occur with nausea or vomiting.   Additional symptoms include weakness.  She feels better lying on her side.  When she is on her back she does not feel well.  Not quite dizzy but "not right".  Worsens when her eyes are open and when she tries to walk.  Balance feels off.  SHe did take ambien(she takes 10 mg , her doctor wants her to take 5 mg)  last night but she takes that every night and does not think that is related.  Past Medical History  Diagnosis Date  . Diabetes mellitus     1990  . Essential hypertension, benign   . Hyperlipidemia   . History of anemia   . History of gout   . Claudication   . CVA (cerebral vascular accident)     LOC DIZZINESS 2008  . Azotemia   . Neck pain   . Renal cell cancer 2001  . Seizure     Past Surgical History  Procedure Date  . Nephrectomy     RIGHT  . Ureteral stent placement     LEFT  . Carotid endarterectomy     LEFT    No family history on file.  History  Substance Use Topics  . Smoking status: Former Smoker    Quit date: 12/26/2005  . Smokeless tobacco: Not on file  . Alcohol Use: Not on file    OB History    Grav Para Term Preterm Abortions TAB SAB Ect Mult Living                  Review of  Systems  Constitutional: Negative for fever.  Cardiovascular: Negative for syncope.  Gastrointestinal: Negative for nausea and vomiting.  Neurological: Positive for dizziness and weakness. Negative for speech change, focal weakness, seizures, loss of consciousness, headaches and paresthesias.  Psychiatric/Behavioral: Negative for altered mental status.  All other systems reviewed and are negative.    Allergies  Codeine; Fentanyl; Hycodan; Lacosamide; Levetiracetam; Metformin and related; Metoprolol; Morphine and related; and Other  Home Medications   Current Outpatient Rx  Name Route Sig Dispense Refill  . ACETAMINOPHEN 500 MG PO TABS Oral Take 1,000 mg by mouth every 6 (six) hours as needed. For pain    . ALLOPURINOL 300 MG PO TABS Oral Take 1 tablet (300 mg total) by mouth daily. 30 tablet 11  . AMLODIPINE BESYLATE 5 MG PO TABS Oral Take 5 mg by mouth daily.    . ASPIRIN 81 MG PO TABS Oral Take 81 mg by mouth daily.    Marland Kitchen CLOPIDOGREL BISULFATE 75 MG PO TABS Oral Take 75 mg by mouth daily.    Marland Kitchen LISINOPRIL 10 MG PO TABS Oral Take 10 mg by mouth daily.    Marland Kitchen PRAVASTATIN SODIUM 40  MG PO TABS Oral Take 1 tablet (40 mg total) by mouth daily. 30 tablet 11  . ZOLPIDEM TARTRATE 10 MG PO TABS Oral Take 10 mg by mouth at bedtime as needed. For sleep      BP 148/60  Pulse 77  Temp 97.6 F (36.4 C) (Oral)  Resp 12  SpO2 100%  Physical Exam  Nursing note and vitals reviewed. Constitutional: She is oriented to person, place, and time. She appears well-developed and well-nourished. She appears listless. No distress.  HENT:  Head: Normocephalic and atraumatic.  Right Ear: External ear normal.  Left Ear: External ear normal.  Mouth/Throat: Oropharynx is clear and moist.  Eyes: Conjunctivae normal are normal. Right eye exhibits no discharge. Left eye exhibits no discharge. No scleral icterus.  Neck: Neck supple. No tracheal deviation present.  Cardiovascular: Normal rate, regular rhythm and  intact distal pulses.   Pulmonary/Chest: Effort normal and breath sounds normal. No stridor. No respiratory distress. She has no wheezes. She has no rales.  Abdominal: Soft. Bowel sounds are normal. She exhibits no distension. There is no tenderness. There is no rebound and no guarding.  Musculoskeletal: She exhibits no edema and no tenderness.  Neurological: She is oriented to person, place, and time. She has normal strength. She appears listless. No cranial nerve deficit ( no gross defecits noted) or sensory deficit. She exhibits normal muscle tone. She displays no seizure activity. Coordination abnormal.       No pronator drift bilateral upper extrem, able to hold both legs off bed for 5 seconds, sensation intact in all extremities, no visual field cuts, no left or right sided neglect, able to do finger to nose exam although does take time and increased effort  Skin: Skin is warm and dry. No rash noted.  Psychiatric: She has a normal mood and affect.    ED Course  Procedures (including critical care time)  Rate: 72  Rhythm: normal sinus rhythm  QRS Axis: normal  Intervals: normal  ST/T Wave abnormalities: normal  Conduction Disutrbances:none  Narrative Interpretation:   Old EKG Reviewed: no sig changes  Labs Reviewed  CBC - Abnormal; Notable for the following:    RBC 3.38 (*)     Hemoglobin 11.1 (*)     HCT 32.6 (*)     All other components within normal limits  DIFFERENTIAL - Abnormal; Notable for the following:    Neutrophils Relative 91 (*)     Neutro Abs 7.8 (*)     Lymphocytes Relative 6 (*)     Lymphs Abs 0.5 (*)     All other components within normal limits  COMPREHENSIVE METABOLIC PANEL - Abnormal; Notable for the following:    Glucose, Bld 115 (*)     BUN 28 (*)     Creatinine, Ser 1.18 (*)     GFR calc non Af Amer 44 (*)     GFR calc Af Amer 51 (*)     All other components within normal limits  GLUCOSE, CAPILLARY - Abnormal; Notable for the following:     Glucose-Capillary 115 (*)     All other components within normal limits  PROTIME-INR  APTT  TROPONIN I   Ct Head Wo Contrast  09/15/2012  *RADIOLOGY REPORT*  Clinical Data: Near-syncope, history of renal cancer  CT HEAD WITHOUT CONTRAST  Technique:  Contiguous axial images were obtained from the base of the skull through the vertex without contrast.  Comparison: 01/26/2012  Findings: No skull fracture is noted.  Again noted hyperostosis frontalis interna.  Paranasal sinuses and mastoid air cells are unremarkable.  No intracranial hemorrhage, mass effect or midline shift.  Stable cerebral atrophy.  Stable periventricular and subcortical chronic white matter disease.  No acute infarction.  No mass lesion is noted on this unenhanced scan.  IMPRESSION: No acute intracranial abnormality.  Stable atrophy and chronic white matter disease.   Original Report Authenticated By: Lahoma Crocker, M.D.    Mr Brain Wo Contrast  09/15/2012  *RADIOLOGY REPORT*  Clinical Data: Syncope with sudden onset of bilateral upper and lower extremity weakness.  Diabetic with hyperlipidemia.  MRI HEAD WITHOUT CONTRAST  Technique:  Multiplanar, multiecho pulse sequences of the brain and surrounding structures were obtained according to standard protocol without intravenous contrast.  Comparison: 09/15/2012.  Findings: No acute infarct.  No intracranial hemorrhage.  Moderate to marked small vessel disease type changes.  No intracranial mass lesion detected on this unenhanced exam.  Major intracranial vascular structures are patent with small right vertebral artery.  Cervical medullary junction unremarkable.  Mild cervical spondylotic changes.  Partially empty sella.  IMPRESSION: No acute infarct.  Moderate to marked small vessel disease type changes.  Please see above.   Original Report Authenticated By: Doug Sou, M.D.      1. Dizziness       MDM  Pt with complaints of dizziness, ?atiaxia.  She has history of stroke.   Symptoms suggest the possibility of a posterior circulation CVA.  Will plan on MRI for further evaluation.  Anemia stable.  Labs otherwise unremarkable.  No sign of stroke on MRI.  Symptoms may be related to peripheral vertigo.  At this time there does not appear to be any evidence of an acute emergency medical condition and the patient appears stable for discharge with appropriate outpatient follow up.        Kathalene Frames, MD 09/15/12 [phone]

## 2012-09-15 NOTE — ED Notes (Signed)
[id number] Expected date:09/15/12<BR> Expected time:<BR> Means of arrival:<BR> Comments:<BR> Near syncopal episode x 2, transfer from [facility]

## 2012-09-15 NOTE — ED Notes (Signed)
1st attempt to obtain labs, pt in x-ray.

## 2012-10-17 ENCOUNTER — Telehealth: Payer: Self-pay | Admitting: Hematology & Oncology

## 2012-10-17 NOTE — Telephone Encounter (Signed)
Pt cx 10-24 will call back to reschedule her husband is sick

## 2012-10-18 ENCOUNTER — Ambulatory Visit: Payer: Medicare Other | Admitting: Medical

## 2012-10-18 ENCOUNTER — Other Ambulatory Visit: Payer: Medicare Other | Admitting: Lab

## 2012-11-07 DIAGNOSIS — R350 Frequency of micturition: Secondary | ICD-10-CM | POA: Diagnosis not present

## 2012-11-07 DIAGNOSIS — R3915 Urgency of urination: Secondary | ICD-10-CM | POA: Diagnosis not present

## 2012-11-13 ENCOUNTER — Telehealth: Payer: Self-pay | Admitting: *Deleted

## 2012-11-13 NOTE — Telephone Encounter (Signed)
rx request for diabetic testing supplies from [facility] faxed

## 2012-11-22 ENCOUNTER — Ambulatory Visit (INDEPENDENT_AMBULATORY_CARE_PROVIDER_SITE_OTHER): Payer: Medicare Other | Admitting: Family Medicine

## 2012-11-22 VITALS — BP 159/68 | HR 112 | Temp 98.6°F | Resp 16 | Ht 61.0 in | Wt 117.0 lb

## 2012-11-22 DIAGNOSIS — J029 Acute pharyngitis, unspecified: Secondary | ICD-10-CM

## 2012-11-22 LAB — POCT RAPID STREP A (OFFICE): Rapid Strep A Screen: NEGATIVE

## 2012-11-22 NOTE — Patient Instructions (Addendum)
Drink plenty of fluids.  Rest her voice  Take Tylenol or ibuprofen for the discomfort  If you're developing a fever or getting sicker we may need to treat this, but I think it will just run its course.

## 2012-11-22 NOTE — Progress Notes (Signed)
Subjective: Patient has been having problems with a sore throat since yesterday. She got more worse today. She has mild congestion.  Objective: TMs are normal. Throat erythematous rash on the right. No exudate. Neck supple without nodes. Chest is clear.  Assessment: Pharyngitis  Plan: Strep screen  Results for orders placed in visit on 11/22/12  POCT RAPID STREP A (OFFICE)      Component Value Range   Rapid Strep A Screen Negative  Negative   Treat symptomatically

## 2012-11-26 ENCOUNTER — Other Ambulatory Visit: Payer: Self-pay | Admitting: Emergency Medicine

## 2012-11-27 NOTE — Telephone Encounter (Signed)
On Gabrielle [name] and Gabrielle [name] , they can continue Ambien but the dosage needs to be decreased to 5 mg at bedtime instead of 10 because of their age.

## 2012-11-27 NOTE — Telephone Encounter (Signed)
Printed prescription, decreased dose to 5mg 

## 2012-11-28 ENCOUNTER — Telehealth: Payer: Self-pay

## 2012-11-28 MED ORDER — FREESTYLE LITE DEVI: Status: DC

## 2012-11-28 NOTE — Telephone Encounter (Signed)
Spoke w/pt and she stated she is allowed to get a free Freestyle meter only (no kit) if we can send in Rx for her. Sending in Rx to [facility] by fax w/Dx code for Medicare.

## 2012-11-28 NOTE — Telephone Encounter (Signed)
Pt states that she entitled to a free meter but her pharmacy is stating that she needs a rx for this. Best# [id number] Pharamcy: [employer]

## 2012-11-29 ENCOUNTER — Other Ambulatory Visit: Payer: Self-pay | Admitting: *Deleted

## 2012-11-29 MED ORDER — FREESTYLE SYSTEM KIT: PACK | Status: DC

## 2012-12-25 DIAGNOSIS — N183 Chronic kidney disease, stage 3 unspecified: Secondary | ICD-10-CM | POA: Diagnosis not present

## 2012-12-25 DIAGNOSIS — D649 Anemia, unspecified: Secondary | ICD-10-CM | POA: Diagnosis not present

## 2012-12-29 ENCOUNTER — Ambulatory Visit (INDEPENDENT_AMBULATORY_CARE_PROVIDER_SITE_OTHER): Payer: Medicare Other | Admitting: Emergency Medicine

## 2012-12-29 VITALS — BP 172/52 | HR 99 | Temp 98.6°F | Resp 16 | Ht 60.38 in | Wt 119.2 lb

## 2012-12-29 DIAGNOSIS — J069 Acute upper respiratory infection, unspecified: Secondary | ICD-10-CM

## 2012-12-29 DIAGNOSIS — R059 Cough, unspecified: Secondary | ICD-10-CM

## 2012-12-29 DIAGNOSIS — R05 Cough: Secondary | ICD-10-CM | POA: Diagnosis not present

## 2012-12-29 NOTE — Patient Instructions (Addendum)
Make sure you drink only fluids take Tylenol every 6 hours and Mucinex twice a day.Marland Kitchen

## 2012-12-29 NOTE — Progress Notes (Signed)
  Subjective:    Patient ID: QUENNA WIX, female    DOB: 05-Apr-1935, 78 y.o.   MRN: [id number]  Cough Associated symptoms include a fever and postnasal drip. Pertinent negatives include no chills, headaches, shortness of breath or wheezing.    Patient comes into our office today with complaints of loss of voice Wednesday, that started some sneezing and coughing she has some nasal drainage and a low grade fever yesterday but took some tylenol to help she also has some body aches with a little soreness in her throat    Review of Systems  Constitutional: Positive for fever. Negative for chills.  HENT: Positive for sneezing and postnasal drip.   Respiratory: Positive for cough. Negative for shortness of breath and wheezing.   Neurological: Negative for headaches.       Objective:   Physical Exam HEENT exam reveals rhinorrhea which is clear posterior pharynx looks normal. Her chest is clear to auscultation and percussion. Her extremities are without edema. She does not appear toxic.       Assessment & Plan:  Patient here with upper respiratory infection. She is to continue Tylenol and Mucinex no other treatment indicated at the present time

## 2013-01-29 DIAGNOSIS — D649 Anemia, unspecified: Secondary | ICD-10-CM | POA: Diagnosis not present

## 2013-01-29 DIAGNOSIS — N2581 Secondary hyperparathyroidism of renal origin: Secondary | ICD-10-CM | POA: Diagnosis not present

## 2013-01-29 DIAGNOSIS — N183 Chronic kidney disease, stage 3 unspecified: Secondary | ICD-10-CM | POA: Diagnosis not present

## 2013-01-29 DIAGNOSIS — I129 Hypertensive chronic kidney disease with stage 1 through stage 4 chronic kidney disease, or unspecified chronic kidney disease: Secondary | ICD-10-CM | POA: Diagnosis not present

## 2013-02-11 ENCOUNTER — Other Ambulatory Visit: Payer: Self-pay | Admitting: Physician Assistant

## 2013-02-12 NOTE — Telephone Encounter (Signed)
Pt is calling back to check to see if her rx for narcotics needs to be faxed it can not be escriped

## 2013-02-13 DIAGNOSIS — I6529 Occlusion and stenosis of unspecified carotid artery: Secondary | ICD-10-CM | POA: Diagnosis not present

## 2013-02-13 DIAGNOSIS — N183 Chronic kidney disease, stage 3 unspecified: Secondary | ICD-10-CM | POA: Diagnosis not present

## 2013-02-13 DIAGNOSIS — I10 Essential (primary) hypertension: Secondary | ICD-10-CM | POA: Diagnosis not present

## 2013-02-13 DIAGNOSIS — E782 Mixed hyperlipidemia: Secondary | ICD-10-CM | POA: Diagnosis not present

## 2013-02-13 DIAGNOSIS — I701 Atherosclerosis of renal artery: Secondary | ICD-10-CM | POA: Diagnosis not present

## 2013-02-13 DIAGNOSIS — D649 Anemia, unspecified: Secondary | ICD-10-CM | POA: Diagnosis not present

## 2013-02-13 NOTE — Telephone Encounter (Signed)
Faxing in Rx and will notify pt that was done.

## 2013-02-13 NOTE — Telephone Encounter (Signed)
Pt is calling back to check status of this RF. Can someone else please review in Dr Perfecto Kingdom absence?

## 2013-02-15 DIAGNOSIS — Z79899 Other long term (current) drug therapy: Secondary | ICD-10-CM | POA: Diagnosis not present

## 2013-02-15 DIAGNOSIS — E782 Mixed hyperlipidemia: Secondary | ICD-10-CM | POA: Diagnosis not present

## 2013-02-18 ENCOUNTER — Other Ambulatory Visit (HOSPITAL_COMMUNITY): Payer: Self-pay | Admitting: Cardiovascular Disease

## 2013-02-18 DIAGNOSIS — I739 Peripheral vascular disease, unspecified: Secondary | ICD-10-CM

## 2013-03-07 ENCOUNTER — Encounter (HOSPITAL_COMMUNITY): Payer: Medicare Other

## 2013-04-02 ENCOUNTER — Other Ambulatory Visit: Payer: Self-pay | Admitting: *Deleted

## 2013-04-02 NOTE — Telephone Encounter (Signed)
l °

## 2013-04-02 NOTE — Telephone Encounter (Signed)
Pt called and stated that pharmacy will not fill blood pressure med lisinopril because she has been taking 1 tablet instead of 1/2 tablet.  Do you want her on 10mg  or 20mg ?  If so can we correct this in her chart because we have her on 20mg  taking 1/2 tab a day and this needs to be fixed if she is suppose to be taking a whole tab.  She states that she does not remember you changing this med.

## 2013-04-02 NOTE — Telephone Encounter (Signed)
He needs to be taking  lisinopril 20 mg one a day she can have #30 refill x5

## 2013-04-03 ENCOUNTER — Telehealth: Payer: Self-pay | Admitting: *Deleted

## 2013-04-03 ENCOUNTER — Other Ambulatory Visit: Payer: Self-pay | Admitting: Physician Assistant

## 2013-04-03 ENCOUNTER — Other Ambulatory Visit: Payer: Self-pay | Admitting: Radiology

## 2013-04-03 MED ORDER — LISINOPRIL 20 MG PO TABS: 20.0000 mg | ORAL_TABLET | Freq: Every day | ORAL | Status: DC

## 2013-04-03 MED ORDER — ZOLPIDEM TARTRATE 5 MG PO TABS: 5.0000 mg | ORAL_TABLET | Freq: Every evening | ORAL | Status: DC | PRN

## 2013-04-03 NOTE — Telephone Encounter (Signed)
Prescriptions faxed to [facility] and conformation page received.

## 2013-04-03 NOTE — Telephone Encounter (Signed)
Faxed Rx to Myrtha Mantis RN has notified patient this was sent in for her, there is a dose change, 5mg  instead of 10 mg

## 2013-04-03 NOTE — Telephone Encounter (Signed)
Please advise, I have sent in Lisinopril now have a request for her Ambien. Pended please advise.

## 2013-05-11 ENCOUNTER — Ambulatory Visit (INDEPENDENT_AMBULATORY_CARE_PROVIDER_SITE_OTHER): Payer: Medicare Other | Admitting: Emergency Medicine

## 2013-05-11 ENCOUNTER — Ambulatory Visit: Payer: Medicare Other

## 2013-05-11 VITALS — BP 120/68 | HR 73 | Temp 98.1°F | Resp 16 | Ht 61.0 in | Wt 120.0 lb

## 2013-05-11 DIAGNOSIS — R300 Dysuria: Secondary | ICD-10-CM | POA: Diagnosis not present

## 2013-05-11 DIAGNOSIS — N390 Urinary tract infection, site not specified: Secondary | ICD-10-CM

## 2013-05-11 LAB — POCT URINALYSIS DIPSTICK
Glucose, UA: NEGATIVE
Ketones, UA: 15
Nitrite, UA: NEGATIVE
Protein, UA: 100
Spec Grav, UA: >=1.03
Urobilinogen, UA: 0.2
pH, UA: 5.5

## 2013-05-11 LAB — POCT UA - MICROSCOPIC ONLY
Casts, Ur, LPF, POC: NEGATIVE
Crystals, Ur, HPF, POC: NEGATIVE
Mucus, UA: NEGATIVE
Yeast, UA: NEGATIVE

## 2013-05-11 MED ORDER — CEPHALEXIN 500 MG PO TABS: 500.0000 mg | ORAL_TABLET | Freq: Two times a day (BID) | ORAL | Status: DC

## 2013-05-11 NOTE — Progress Notes (Signed)
  Subjective:    Patient ID: Gabrielle [name], female    DOB: July 18, 1935, 78 y.o.   MRN: [id number]  HPI 78 year old female presents with:  Pain after urination, blood in urine this morning "pinkish color", no increase in urination. This began 11 am today. No vaginal bleeding 14 years since last kidney stone diagnosed with Kidney cancer 14 years ago Has not seen urologist in a while   Review of Systems     Objective:   Physical Exam Chest is clear to auscultation and percussion. Heart regular rate no murmurs. Abdomen is flat there is a large upper abdominal scar that is well-healed there is no tenderness in the lower abdomen .  [facility] reading (PRIMARY) by  Dr [name] clips are present from previous right nephrectomy. There is a calcific density adjacent to the L5 vertebrae on the right. There is also vascular calcific  Results for orders placed in visit on 05/11/13  POCT URINALYSIS DIPSTICK      Result Value Range   Color, UA amber     Clarity, UA turbin     Glucose, UA neg     Bilirubin, UA small     Ketones, UA 15     Spec Grav, UA >=1.030     Blood, UA large     pH, UA 5.5     Protein, UA 100     Urobilinogen, UA 0.2     Nitrite, UA neg     Leukocytes, UA small (1+)    POCT UA - MICROSCOPIC ONLY      Result Value Range   WBC, Ur, HPF, POC tntc     RBC, urine, microscopic tntc     Bacteria, U Microscopic 1+     Mucus, UA neg     Epithelial cells, urine per micros 0-4     Crystals, Ur, HPF, POC neg     Casts, Ur, LPF, POC neg     Yeast, UA neg         Assessment & Plan:  Patient hasn't known renal disease we'll treat with cephalexin 500 twice a day for 7 days urine culture was done

## 2013-05-11 NOTE — Patient Instructions (Addendum)
Urinary Tract Infection Urinary tract infections (UTIs) can develop anywhere along your urinary tract. Your urinary tract is your body's drainage system for removing wastes and extra water. Your urinary tract includes two kidneys, two ureters, a bladder, and a urethra. Your kidneys are a pair of bean-shaped organs. Each kidney is about the size of your fist. They are located below your ribs, one on each side of your spine. CAUSES Infections are caused by microbes, which are microscopic organisms, including fungi, viruses, and bacteria. These organisms are so small that they can only be seen through a microscope. Bacteria are the microbes that most commonly cause UTIs. SYMPTOMS  Symptoms of UTIs may vary by age and gender of the patient and by the location of the infection. Symptoms in young women typically include a frequent and intense urge to urinate and a painful, burning feeling in the bladder or urethra during urination. Older women and men are more likely to be tired, shaky, and weak and have muscle aches and abdominal pain. A fever may mean the infection is in your kidneys. Other symptoms of a kidney infection include pain in your back or sides below the ribs, nausea, and vomiting. DIAGNOSIS To diagnose a UTI, your caregiver will ask you about your symptoms. Your caregiver also will ask to provide a urine sample. The urine sample will be tested for bacteria and white blood cells. White blood cells are made by your body to help fight infection. TREATMENT  Typically, UTIs can be treated with medication. Because most UTIs are caused by a bacterial infection, they usually can be treated with the use of antibiotics. The choice of antibiotic and length of treatment depend on your symptoms and the type of bacteria causing your infection. HOME CARE INSTRUCTIONS  If you were prescribed antibiotics, take them exactly as your caregiver instructs you. Finish the medication even if you feel better after you  have only taken some of the medication.  Drink enough water and fluids to keep your urine clear or pale yellow.  Avoid caffeine, tea, and carbonated beverages. They tend to irritate your bladder.  Empty your bladder often. Avoid holding urine for long periods of time.  Empty your bladder before and after sexual intercourse.  After a bowel movement, women should cleanse from front to back. Use each tissue only once. SEEK MEDICAL CARE IF:   You have back pain.  You develop a fever.  Your symptoms do not begin to resolve within 3 days. SEEK IMMEDIATE MEDICAL CARE IF:   You have severe back pain or lower abdominal pain.  You develop chills.  You have nausea or vomiting.  You have continued burning or discomfort with urination. MAKE SURE YOU:   Understand these instructions.  Will watch your condition.  Will get help right away if you are not doing well or get worse. Document Released: 09/21/2005 Document Revised: 06/12/2012 Document Reviewed: 01/20/2012 [employer] Patient Information 2013 [employer].  

## 2013-05-13 LAB — URINE CULTURE: Colony Count: 45000

## 2013-06-18 NOTE — Telephone Encounter (Signed)
error 

## 2013-06-19 ENCOUNTER — Other Ambulatory Visit: Payer: Self-pay | Admitting: Emergency Medicine

## 2013-06-25 ENCOUNTER — Other Ambulatory Visit: Payer: Self-pay | Admitting: Emergency Medicine

## 2013-07-24 ENCOUNTER — Other Ambulatory Visit: Payer: Self-pay | Admitting: Physician Assistant

## 2013-07-24 ENCOUNTER — Other Ambulatory Visit (HOSPITAL_COMMUNITY): Payer: Self-pay | Admitting: Cardiovascular Disease

## 2013-07-24 NOTE — Telephone Encounter (Signed)
Rx was sent to pharmacy electronically. 

## 2013-07-25 ENCOUNTER — Telehealth: Payer: Self-pay

## 2013-07-25 NOTE — Telephone Encounter (Signed)
Patient is requesting 90 day supply for recent scripts.   [id number]

## 2013-07-26 MED ORDER — PRAVASTATIN SODIUM 40 MG PO TABS: 40.0000 mg | ORAL_TABLET | Freq: Every day | ORAL | Status: DC

## 2013-07-26 MED ORDER — ALLOPURINOL 300 MG PO TABS: 300.0000 mg | ORAL_TABLET | Freq: Every day | ORAL | Status: DC

## 2013-07-26 NOTE — Telephone Encounter (Signed)
Called pt and advised that she is overdue for check-up labs. We had not received a req for pravastatin, but had denied a 90 day supp of allopurinol w/that message. Pt agreed to RTC and I sent in a 1 mos supply of both meds.

## 2013-07-26 NOTE — Telephone Encounter (Signed)
Pt states that she needs a refill on  allopurinol (ZYLOPRIM) 300 MG tablet and pravastatin (PRAVACHOL) 40 MG tablet, she also states that the pharmacy has sent over numerous requests for these medications. Best# [phone] [employer]

## 2013-08-02 ENCOUNTER — Ambulatory Visit (INDEPENDENT_AMBULATORY_CARE_PROVIDER_SITE_OTHER): Payer: Medicare Other | Admitting: Cardiology

## 2013-08-02 ENCOUNTER — Encounter: Payer: Self-pay | Admitting: Cardiology

## 2013-08-02 VITALS — BP 140/60 | HR 64 | Ht 61.0 in | Wt 122.1 lb

## 2013-08-02 DIAGNOSIS — I10 Essential (primary) hypertension: Secondary | ICD-10-CM

## 2013-08-02 DIAGNOSIS — L988 Other specified disorders of the skin and subcutaneous tissue: Secondary | ICD-10-CM

## 2013-08-02 DIAGNOSIS — C649 Malignant neoplasm of unspecified kidney, except renal pelvis: Secondary | ICD-10-CM

## 2013-08-02 DIAGNOSIS — E785 Hyperlipidemia, unspecified: Secondary | ICD-10-CM | POA: Diagnosis not present

## 2013-08-02 DIAGNOSIS — C641 Malignant neoplasm of right kidney, except renal pelvis: Secondary | ICD-10-CM

## 2013-08-02 DIAGNOSIS — I739 Peripheral vascular disease, unspecified: Secondary | ICD-10-CM | POA: Diagnosis not present

## 2013-08-02 NOTE — Progress Notes (Signed)
08/02/2013   PCP: [name], MD   Chief Complaint  Patient presents with  . Follow-up    6 month visit,no chest pain, no sob ,no edema    Primary Cardiologist:Dr. Gwenlyn Found  HPI: 78 year old white married female mother of 2 grandmother to 5 grandchildren followed by Dr. For peripheral vascular disease and negative nuclear stress test was in July 2012. She has a history of peripheral arterial disease with known carotid disease status post left carotid endarterectomy in 2004. She has renovascular disease as well as lower extremity occlusive disease. Has a history of left renal artery stenting by Dr. Gwenlyn Found in 2004 she has a history of right nephrectomy for renal cancer in. She has bilaterally occluded SFA is as well as moderate iliac disease left greater than right. Her last Dopplers were 2013 one year ago at that time her ABI on the right was 0.74 and the left 0.78.  She is here today for followup she has no chest pain, no shortness of breath. She has no specific claudication symptoms though she does complain of right hip pain and down her right leg she attributes this to arthritis but it is time for lower extremity Doppler to ensure that this is not claudication for her.  Her lipids are followed by Dr. [name] and there to be drawn soon.  Allergies  Allergen Reactions  . Codeine Nausea And Vomiting  . Fentanyl   . Hycodan (Hydrocodone-Homatropine) Itching  . Lacosamide      Other name is, VIMPAT  . Levetiracetam Other (See Comments)    Strange feelings in head  . Metformin And Related   . Metoprolol   . Morphine And Related Itching  . Tessalon (Benzonatate)   . Other Rash    BETA BLOCKER    Current Outpatient Prescriptions  Medication Sig Dispense Refill  . acetaminophen (TYLENOL) 500 MG tablet Take 1,000 mg by mouth every 6 (six) hours as needed. For pain      . allopurinol (ZYLOPRIM) 300 MG tablet Take 1 tablet (300 mg total) by mouth daily. PATIENT NEEDS OFFICE VISIT  FOR ADDITIONAL REFILLS  30 tablet  0  . amLODipine (NORVASC) 5 MG tablet Take 5 mg by mouth daily.      Marland Kitchen aspirin 81 MG tablet Take 81 mg by mouth daily.      . Blood Glucose Monitoring Suppl (FREESTYLE LITE) DEVI Use Glucose meter to test blood sugar once daily in morning. Dx code 250.00  1 each  0  . clopidogrel (PLAVIX) 75 MG tablet TAKE ONE TABLET BY MOUTH EVERY DAY  30 tablet  7  . glipiZIDE (GLUCOTROL) 5 MG tablet Take 2.5 mg by mouth daily after breakfast.      . glucose monitoring kit (FREESTYLE) monitoring kit Use as directed. Dx code 250.00.  1 each  0  . lisinopril (PRINIVIL,ZESTRIL) 20 MG tablet Take 1 tablet (20 mg total) by mouth daily.  90 tablet  3  . pravastatin (PRAVACHOL) 40 MG tablet Take 1 tablet (40 mg total) by mouth daily. PATIENT NEEDS OFFICE VISIT/LABS FOR ADDITIONAL REFILLS  30 tablet  0  . zolpidem (AMBIEN) 5 MG tablet Take 1 tablet (5 mg total) by mouth at bedtime as needed for sleep.  30 tablet  1   No current facility-administered medications for this visit.    Past Medical History  Diagnosis Date  . Diabetes mellitus     1990  . Essential hypertension, benign   .  Hyperlipidemia   . History of anemia   . History of gout   . Claudication   . CVA (cerebral vascular accident)     LOC DIZZINESS 2008  . Azotemia   . Neck pain   . Renal cell cancer 2001  . Seizure   . PAD (peripheral artery disease)     hx. LCEA, hx Lt renal art. stenting, occl rt renal artery, occluded bil SFAs, moderatie iliac disease,      Past Surgical History  Procedure Laterality Date  . Nephrectomy      RIGHT  . Ureteral stent placement      LEFT  . Carotid endarterectomy      LEFT  . Pv angiogram  2004    Lt renal artery stent    [id number] colds or fevers, no weight changes Skin:no rashes or ulcers HEENT:no blurred vision, no congestion CV:see HPI PUL:see HPI GI:no diarrhea constipation or melena, no indigestion GU:no hematuria, no dysuria MS:no joint pain, ?  Claudication with rt hip pain and down rt leg. Neuro:no syncope, no lightheadedness Endo:+ diabetes that is stable, no thyroid disease  PHYSICAL EXAM BP 140/60  Pulse 64  Ht 5\' 1"  (1.549 m)  Wt 122 lb 1.6 oz (55.384 kg)  BMI 23.08 kg/m2 General:Pleasant affect, NAD Skin:Warm and dry, brisk capillary refill HEENT:normocephalic, sclera clear, mucus membranes moist Neck:supple, no JVD, no bruits  Heart:S1S2 RRR without murmur, gallup, rub or click Lungs:clear without rales, rhonchi, or wheezes [id number], non tender, + BS, do not palpate liver spleen or masses Ext:no lower ext edema, 1+ pedal pulses, 2+ radial pulses Neuro:alert and oriented, MAE, follows commands, + facial symmetry  EKG: Sinus rhythm rate of 64 without any acute ST changes.  ASSESSMENT AND PLAN PAD (peripheral artery disease) hx. LCEA, hx Lt renal art. stenting, occl rt renal artery s/p nephrectomy, occluded bil SFAs, moderatie iliac disease, now time for LEA dopplers, carotid copplers, and renal doppler.  Renal cell cancer Hx rt Nephrectomy.  Hyperlipidemia Followed by Dr. [name], to be drawn this month  Essential hypertension, benign stable    She'll followup with nephrology this month as well. To follow with Dr. Gwenlyn Found in 6 months if her Doppler studies are abnormal we will call her and she'll see Dr. Gwenlyn Found prior to that time

## 2013-08-02 NOTE — Assessment & Plan Note (Signed)
hx. LCEA, hx Lt renal art. stenting, occl rt renal artery s/p nephrectomy, occluded bil SFAs, moderatie iliac disease, now time for LEA dopplers, carotid copplers, and renal doppler.

## 2013-08-02 NOTE — Assessment & Plan Note (Signed)
Followed by Dr. [name], to be drawn this month

## 2013-08-02 NOTE — Assessment & Plan Note (Signed)
stable °

## 2013-08-02 NOTE — Patient Instructions (Signed)
We will schedule you for renal artery doppler, lower ext.arterial dopplers, and carotid dopplers.  Call if any chest pain or Shortness of breath  EKG was stable.    Follow up with Dr. Gwenlyn Found in 6 months, if any abnormalities with these tests we will call the results.

## 2013-08-02 NOTE — Assessment & Plan Note (Signed)
Hx rt Nephrectomy.

## 2013-08-14 ENCOUNTER — Ambulatory Visit (INDEPENDENT_AMBULATORY_CARE_PROVIDER_SITE_OTHER): Payer: Medicare Other | Admitting: Family Medicine

## 2013-08-14 VITALS — BP 130/80 | HR 100 | Temp 98.2°F | Resp 16 | Ht 60.0 in | Wt 122.4 lb

## 2013-08-14 DIAGNOSIS — H02403 Unspecified ptosis of bilateral eyelids: Secondary | ICD-10-CM

## 2013-08-14 DIAGNOSIS — R7989 Other specified abnormal findings of blood chemistry: Secondary | ICD-10-CM | POA: Diagnosis not present

## 2013-08-14 DIAGNOSIS — E790 Hyperuricemia without signs of inflammatory arthritis and tophaceous disease: Secondary | ICD-10-CM

## 2013-08-14 DIAGNOSIS — I10 Essential (primary) hypertension: Secondary | ICD-10-CM

## 2013-08-14 DIAGNOSIS — E119 Type 2 diabetes mellitus without complications: Secondary | ICD-10-CM | POA: Diagnosis not present

## 2013-08-14 DIAGNOSIS — M1990 Unspecified osteoarthritis, unspecified site: Secondary | ICD-10-CM

## 2013-08-14 DIAGNOSIS — I739 Peripheral vascular disease, unspecified: Secondary | ICD-10-CM | POA: Diagnosis not present

## 2013-08-14 DIAGNOSIS — G4700 Insomnia, unspecified: Secondary | ICD-10-CM

## 2013-08-14 LAB — LIPID PANEL
Cholesterol: 141 mg/dL (ref 0–200)
HDL: 54 mg/dL (ref >39)
LDL Cholesterol: 66 mg/dL (ref 0–99)
Total CHOL/HDL Ratio: 2.6 Ratio
Triglycerides: 106 mg/dL (ref <150)
VLDL: 21 mg/dL (ref 0–40)

## 2013-08-14 LAB — COMPREHENSIVE METABOLIC PANEL
ALT: 10 U/L (ref 0–35)
AST: 15 U/L (ref 0–37)
Albumin: 4.5 g/dL (ref 3.5–5.2)
Alkaline Phosphatase: 70 U/L (ref 39–117)
BUN: 23 mg/dL (ref 6–23)
CO2: 24 mEq/L (ref 19–32)
Calcium: 9.8 mg/dL (ref 8.4–10.5)
Chloride: 105 mEq/L (ref 96–112)
Creat: 1.4 mg/dL — ABNORMAL HIGH (ref 0.50–1.10)
Glucose, Bld: 109 mg/dL — ABNORMAL HIGH (ref 70–99)
Potassium: 4.7 mEq/L (ref 3.5–5.3)
Sodium: 139 mEq/L (ref 135–145)
Total Bilirubin: 0.5 mg/dL (ref 0.3–1.2)
Total Protein: 7.3 g/dL (ref 6.0–8.3)

## 2013-08-14 LAB — CBC
HCT: 33.3 % — ABNORMAL LOW (ref 36.0–46.0)
Hemoglobin: 11.8 g/dL — ABNORMAL LOW (ref 12.0–15.0)
MCH: 31.7 pg (ref 26.0–34.0)
MCHC: 35.4 g/dL (ref 30.0–36.0)
MCV: 89.5 fL (ref 78.0–100.0)
Platelets: 369 10*3/uL (ref 150–400)
RBC: 3.72 MIL/uL — ABNORMAL LOW (ref 3.87–5.11)
RDW: 15.4 % (ref 11.5–15.5)
WBC: 6.9 10*3/uL (ref 4.0–10.5)

## 2013-08-14 LAB — POCT GLYCOSYLATED HEMOGLOBIN (HGB A1C): Hemoglobin A1C: 5.3

## 2013-08-14 LAB — URIC ACID: Uric Acid, Serum: 3.5 mg/dL (ref 2.4–7.0)

## 2013-08-14 MED ORDER — GLIPIZIDE 5 MG PO TABS: 2.5000 mg | ORAL_TABLET | Freq: Every day | ORAL | Status: DC

## 2013-08-14 MED ORDER — CLOPIDOGREL BISULFATE 75 MG PO TABS: 75.0000 mg | ORAL_TABLET | Freq: Every day | ORAL | Status: DC

## 2013-08-14 MED ORDER — AMLODIPINE BESYLATE 5 MG PO TABS: 5.0000 mg | ORAL_TABLET | Freq: Every day | ORAL | Status: DC

## 2013-08-14 MED ORDER — LORAZEPAM 1 MG PO TABS: 1.0000 mg | ORAL_TABLET | Freq: Every evening | ORAL | Status: DC | PRN

## 2013-08-14 MED ORDER — ZOLPIDEM TARTRATE 10 MG PO TABS: 10.0000 mg | ORAL_TABLET | Freq: Every evening | ORAL | Status: DC | PRN

## 2013-08-14 MED ORDER — LISINOPRIL 20 MG PO TABS: 20.0000 mg | ORAL_TABLET | Freq: Every day | ORAL | Status: DC

## 2013-08-14 NOTE — Progress Notes (Signed)
78 yo married woman with neck arthritis, peripheral artery disease, and insomnia.  The ambien at 5 mg is not adequate.    Patient has had foot pain in remote past, not sure if it was gout.  H/O stents left neck, left kidney  C/o ptosis  Objective:  NAD Neck: supple, no bruit Chest:  Clear Heart: regular, no murmur heard today, no gallop Ext: no edema, trace pedal pulses Skin: ecchymosis right forearm, right frontal area.  Assessment:  Stable, insomnia is a significant problem.  Plan: Peripheral artery disease - Plan: POCT glycosylated hemoglobin (Hb A1C), CBC, Comprehensive metabolic panel, Lipid panel, clopidogrel (PLAVIX) 75 MG tablet  Azotemia - Plan: POCT glycosylated hemoglobin (Hb A1C), CBC, Comprehensive metabolic panel, Lipid panel  Arthritis  Hypertension - Plan: lisinopril (PRINIVIL,ZESTRIL) 20 MG tablet, amLODipine (NORVASC) 5 MG tablet  Insomnia - Plan: zolpidem (AMBIEN) 10 MG tablet  Diabetes - Plan: glipiZIDE (GLUCOTROL) 5 MG tablet  Hyperuricemia stop the allopurinol and check uric acid  ophthalmol referral for ptosis  Fax all labs to [phone], Elmarie Shiley, MD Signed, [name], MD

## 2013-08-14 NOTE — Patient Instructions (Addendum)
Discontinue the allopurinol

## 2013-08-16 ENCOUNTER — Ambulatory Visit (HOSPITAL_COMMUNITY)
Admission: RE | Admit: 2013-08-16 | Discharge: 2013-08-16 | Disposition: A | Discharge status: Home / Self Care | Payer: Medicare Other | Source: Ambulatory Visit | Attending: Cardiovascular Disease | Admitting: Cardiovascular Disease

## 2013-08-16 DIAGNOSIS — I6529 Occlusion and stenosis of unspecified carotid artery: Secondary | ICD-10-CM

## 2013-08-16 DIAGNOSIS — I739 Peripheral vascular disease, unspecified: Secondary | ICD-10-CM | POA: Diagnosis not present

## 2013-08-16 NOTE — Progress Notes (Signed)
Carotid Duplex Completed. Colt Martelle, BS, RDMS, RVT  

## 2013-08-20 ENCOUNTER — Telehealth: Payer: Self-pay

## 2013-08-20 MED ORDER — PRAVASTATIN SODIUM 40 MG PO TABS: 40.0000 mg | ORAL_TABLET | Freq: Every day | ORAL | Status: DC

## 2013-08-20 NOTE — Telephone Encounter (Signed)
Patient advised.

## 2013-08-20 NOTE — Telephone Encounter (Signed)
Sent to pharmacy 

## 2013-08-20 NOTE — Telephone Encounter (Signed)
PT STATES WE CALLED IN ALL HER MEDS EXCEPT THE PRAVASTATIN AND SHE REALLY NEED THAT ONE ALSO. IS ON HER WAY TO [facility] NOW AND WANT [place] TO CALL IT IN SO SHE DON'T HAVE TO  MAKE 2 TRIPS. TRIED TO LET HER KNOW WE HAVE TO REVIEW FIRST, BUT SHE IS STILL ON HER WAY OVER TO [facility] PLEASE CALL [phone]   [facility]

## 2013-08-22 ENCOUNTER — Encounter: Payer: Self-pay | Admitting: *Deleted

## 2013-08-22 ENCOUNTER — Telehealth: Payer: Self-pay | Admitting: *Deleted

## 2013-08-22 DIAGNOSIS — I6529 Occlusion and stenosis of unspecified carotid artery: Secondary | ICD-10-CM

## 2013-08-22 NOTE — Telephone Encounter (Signed)
Message copied by Chauncy Lean on Thu Aug 22, 2013  1:33 PM ------      Message from: Lorretta Harp      Created: Wed Aug 21, 2013  5:10 PM       No change from prior study. Repeat in 12 months. ------

## 2013-08-23 DIAGNOSIS — I129 Hypertensive chronic kidney disease with stage 1 through stage 4 chronic kidney disease, or unspecified chronic kidney disease: Secondary | ICD-10-CM | POA: Diagnosis not present

## 2013-08-23 DIAGNOSIS — D649 Anemia, unspecified: Secondary | ICD-10-CM | POA: Diagnosis not present

## 2013-08-23 DIAGNOSIS — N2581 Secondary hyperparathyroidism of renal origin: Secondary | ICD-10-CM | POA: Diagnosis not present

## 2013-08-23 DIAGNOSIS — N183 Chronic kidney disease, stage 3 unspecified: Secondary | ICD-10-CM | POA: Diagnosis not present

## 2013-08-27 ENCOUNTER — Encounter (HOSPITAL_COMMUNITY): Payer: Medicare Other

## 2013-08-29 ENCOUNTER — Ambulatory Visit (HOSPITAL_COMMUNITY)
Admission: RE | Admit: 2013-08-29 | Discharge: 2013-08-29 | Disposition: A | Discharge status: Home / Self Care | Payer: Medicare Other | Source: Ambulatory Visit | Attending: Cardiovascular Disease | Admitting: Cardiovascular Disease

## 2013-08-29 ENCOUNTER — Ambulatory Visit (HOSPITAL_BASED_OUTPATIENT_CLINIC_OR_DEPARTMENT_OTHER)
Admission: RE | Admit: 2013-08-29 | Discharge: 2013-08-29 | Disposition: A | Discharge status: Home / Self Care | Payer: Medicare Other | Source: Ambulatory Visit | Attending: Cardiovascular Disease | Admitting: Cardiovascular Disease

## 2013-08-29 DIAGNOSIS — I739 Peripheral vascular disease, unspecified: Secondary | ICD-10-CM

## 2013-08-29 DIAGNOSIS — I70219 Atherosclerosis of native arteries of extremities with intermittent claudication, unspecified extremity: Secondary | ICD-10-CM

## 2013-08-29 DIAGNOSIS — I10 Essential (primary) hypertension: Secondary | ICD-10-CM

## 2013-08-29 NOTE — Progress Notes (Signed)
Arterial Duplex Lower Ext. Completed. Gabrielle [name], BS, RDMS, RVT  

## 2013-08-29 NOTE — Progress Notes (Signed)
Renal Duplex Completed. Ellenor Wisniewski, BS, RDMS, RVT  

## 2013-09-03 ENCOUNTER — Encounter: Payer: Self-pay | Admitting: *Deleted

## 2013-09-03 ENCOUNTER — Telehealth: Payer: Self-pay | Admitting: *Deleted

## 2013-09-03 DIAGNOSIS — I739 Peripheral vascular disease, unspecified: Secondary | ICD-10-CM

## 2013-09-03 NOTE — Telephone Encounter (Signed)
Message copied by Chauncy Lean on Tue Sep 03, 2013 11:04 PM ------      Message from: Lorretta Harp      Created: Tue Sep 03, 2013  5:01 PM       Widely patent LRAStent. Repeat in 12 months ------

## 2013-09-03 NOTE — Telephone Encounter (Signed)
Order placed for repeat renal and lower extremity arterial dopplers

## 2013-09-07 ENCOUNTER — Ambulatory Visit (INDEPENDENT_AMBULATORY_CARE_PROVIDER_SITE_OTHER): Payer: Medicare Other | Admitting: Family Medicine

## 2013-09-07 DIAGNOSIS — Z23 Encounter for immunization: Secondary | ICD-10-CM

## 2013-10-07 ENCOUNTER — Other Ambulatory Visit: Payer: Self-pay | Admitting: Emergency Medicine

## 2013-10-07 NOTE — Telephone Encounter (Signed)
The max dose she can take his 5 mg at bedtime

## 2013-11-18 ENCOUNTER — Other Ambulatory Visit: Payer: Self-pay | Admitting: Physician Assistant

## 2013-11-29 ENCOUNTER — Ambulatory Visit (INDEPENDENT_AMBULATORY_CARE_PROVIDER_SITE_OTHER): Payer: Medicare Other | Admitting: Emergency Medicine

## 2013-11-29 VITALS — BP 134/62 | HR 75 | Temp 98.0°F | Resp 18 | Ht 61.0 in | Wt 125.0 lb

## 2013-11-29 DIAGNOSIS — R7309 Other abnormal glucose: Secondary | ICD-10-CM | POA: Diagnosis not present

## 2013-11-29 DIAGNOSIS — G4700 Insomnia, unspecified: Secondary | ICD-10-CM | POA: Diagnosis not present

## 2013-11-29 DIAGNOSIS — N289 Disorder of kidney and ureter, unspecified: Secondary | ICD-10-CM | POA: Diagnosis not present

## 2013-11-29 DIAGNOSIS — M791 Myalgia, unspecified site: Secondary | ICD-10-CM

## 2013-11-29 DIAGNOSIS — M542 Cervicalgia: Secondary | ICD-10-CM

## 2013-11-29 DIAGNOSIS — IMO0001 Reserved for inherently not codable concepts without codable children: Secondary | ICD-10-CM | POA: Diagnosis not present

## 2013-11-29 DIAGNOSIS — R739 Hyperglycemia, unspecified: Secondary | ICD-10-CM

## 2013-11-29 LAB — POCT GLYCOSYLATED HEMOGLOBIN (HGB A1C): Hemoglobin A1C: 5.7

## 2013-11-29 LAB — BASIC METABOLIC PANEL
BUN: 19 mg/dL (ref 6–23)
CO2: 25 mEq/L (ref 19–32)
Calcium: 9 mg/dL (ref 8.4–10.5)
Chloride: 105 mEq/L (ref 96–112)
Creat: 1.28 mg/dL — ABNORMAL HIGH (ref 0.50–1.10)
Glucose, Bld: 162 mg/dL — ABNORMAL HIGH (ref 70–99)
Potassium: 4 mEq/L (ref 3.5–5.3)
Sodium: 141 mEq/L (ref 135–145)

## 2013-11-29 LAB — POCT SEDIMENTATION RATE: POCT SED RATE: 33 mm/hr — AB (ref 0–22)

## 2013-11-29 LAB — GLUCOSE, POCT (MANUAL RESULT ENTRY): POC Glucose: 180 mg/dl — AB (ref 70–99)

## 2013-11-29 LAB — CK: Total CK: 79 U/L (ref 7–177)

## 2013-11-29 NOTE — Progress Notes (Addendum)
Subjective:    Patient ID: Gabrielle [name], female    DOB: 22-Apr-1935, 78 y.o.   MRN: [id number]  This chart was scribed for Darlyne Russian, MD by Zettie Pho, ED Scribe.   Chief Complaint  Patient presents with  . Insomnia    just isn't feeling well can't explain     HPI Gabrielle [name] is a 78 y.o. female with a history of CVA who presents to [facility] complaining of "not feeling good all over." She reports that she was not able to sleep 2 nights ago, even after taking Ambien, which she usually takes around 10:30 PM. She reports that she was able to sleep until about 4:00 AM this morning, but that is unusual for her. Patient reports associated fatigue, diffuse myalgias and arthralgias, and some associated headache. She reports some associated headache that has been gradually improving. Patient is also complaining of some pain to the right toes that is exacerbated with walking. Patient reports taking Tylenol at home without relief. She denies any recent heavy lifting. Patient has been recently experiencing some at-home stress after her husband suffered a stroke. She denies fever, chills, sore throat, cough. Patient also has a history of DM, gout, and arthritis.   Past Medical History  Diagnosis Date  . Diabetes mellitus     1990  . Essential hypertension, benign   . Hyperlipidemia   . History of anemia   . History of gout   . Claudication   . CVA (cerebral vascular accident)     LOC DIZZINESS 2008  . Azotemia   . Neck pain   . Renal cell cancer 2001  . Seizure   . PAD (peripheral artery disease)     hx. LCEA, hx Lt renal art. stenting, occl rt renal artery, occluded bil SFAs, moderatie iliac disease,    . Arthritis    Current Outpatient Prescriptions on File Prior to Visit  Medication Sig Dispense Refill  . acetaminophen (TYLENOL) 500 MG tablet Take 1,000 mg by mouth every 6 (six) hours as needed. For pain      . allopurinol (ZYLOPRIM) 300 MG tablet Take 1  tablet (300 mg total) by mouth daily. PATIENT NEEDS OFFICE VISIT FOR ADDITIONAL REFILLS  30 tablet  0  . amLODipine (NORVASC) 5 MG tablet Take 1 tablet (5 mg total) by mouth daily.  90 tablet  3  . aspirin 81 MG tablet Take 81 mg by mouth daily.      . Blood Glucose Monitoring Suppl (FREESTYLE LITE) DEVI Use Glucose meter to test blood sugar once daily in morning. Dx code 250.00  1 each  0  . clopidogrel (PLAVIX) 75 MG tablet Take 1 tablet (75 mg total) by mouth daily.  90 tablet  3  . glipiZIDE (GLUCOTROL) 5 MG tablet Take 0.5 tablets (2.5 mg total) by mouth daily after breakfast.  45 tablet  3  . glucose monitoring kit (FREESTYLE) monitoring kit Use as directed. Dx code 250.00.  1 each  0  . lisinopril (PRINIVIL,ZESTRIL) 20 MG tablet Take 1 tablet (20 mg total) by mouth daily.  90 tablet  3  . LORazepam (ATIVAN) 1 MG tablet Take 1 tablet (1 mg total) by mouth at bedtime as needed for anxiety.  90 tablet  1  . pravastatin (PRAVACHOL) 40 MG tablet TAKE ONE TABLET BY MOUTH ONCE DAILY  30 tablet  1  . zolpidem (AMBIEN) 10 MG tablet Take 1 tablet (10 mg total) by  mouth at bedtime as needed for sleep.  90 tablet  1   No current facility-administered medications on file prior to visit.   Allergies  Allergen Reactions  . Codeine Nausea And Vomiting  . Fentanyl   . Hycodan [Hydrocodone-Homatropine] Itching  . Lacosamide      Other name is, VIMPAT  . Levetiracetam Other (See Comments)    Strange feelings in head  . Metformin And Related   . Metoprolol   . Morphine And Related Itching  . Tessalon [Benzonatate]   . Other Rash    BETA BLOCKER   Review of Systems  Constitutional: Positive for fatigue. Negative for fever and chills.  HENT: Negative for sore throat.   Respiratory: Negative for cough.   Musculoskeletal: Positive for arthralgias and myalgias.  Neurological: Positive for headaches.  Psychiatric/Behavioral: Positive for sleep disturbance.      Objective:   Physical Exam    Nursing note and vitals reviewed. Constitutional: She is oriented to person, place, and time. She appears well-developed and well-nourished. No distress.  HENT:  Head: Normocephalic and atraumatic.  Eyes: Conjunctivae are normal. Pupils are equal, round, and reactive to light.  Neck: Normal range of motion. Neck supple.  Cardiovascular: Normal rate and regular rhythm.  Exam reveals no gallop and no friction rub.   Murmur heard. 2/6 systolic murmur at the base of the heart.   Pulmonary/Chest: Effort normal and breath sounds normal. No respiratory distress. She has no wheezes. She has no rales.  Abdominal: She exhibits no distension.  Musculoskeletal: Normal range of motion. She exhibits tenderness.  Tender to the right peri scapular muscles. No other extremity tenderness.   Neurological: She is alert and oriented to person, place, and time. She has normal reflexes.  Reflex Scores:      Brachioradialis reflexes are 2+ on the right side and 2+ on the left side.      Patellar reflexes are 2+ on the right side and 2+ on the left side. Skin: Skin is warm and dry.  Psychiatric: She has a normal mood and affect. Her behavior is normal.   Results for orders placed in visit on 08/14/13  CBC      Result Value Range   WBC 6.9  4.0 - 10.5 K/uL   RBC 3.72 (*) 3.87 - 5.11 MIL/uL   Hemoglobin 11.8 (*) 12.0 - 15.0 g/dL   HCT 33.3 (*) 36.0 - 46.0 %   MCV 89.5  78.0 - 100.0 fL   MCH 31.7  26.0 - 34.0 pg   MCHC 35.4  30.0 - 36.0 g/dL   RDW 15.4  11.5 - 15.5 %   Platelets 369  150 - 400 K/uL  COMPREHENSIVE METABOLIC PANEL      Result Value Range   Sodium 139  135 - 145 mEq/L   Potassium 4.7  3.5 - 5.3 mEq/L   Chloride 105  96 - 112 mEq/L   CO2 24  19 - 32 mEq/L   Glucose, Bld 109 (*) 70 - 99 mg/dL   BUN 23  6 - 23 mg/dL   Creat 1.40 (*) 0.50 - 1.10 mg/dL   Total Bilirubin 0.5  0.3 - 1.2 mg/dL   Alkaline Phosphatase 70  39 - 117 U/L   AST 15  0 - 37 U/L   ALT 10  0 - 35 U/L   Total Protein  7.3  6.0 - 8.3 g/dL   Albumin 4.5  3.5 - 5.2 g/dL   Calcium 9.8  8.4 - 10.5 mg/dL  LIPID PANEL      Result Value Range   Cholesterol 141  0 - 200 mg/dL   Triglycerides 106  <150 mg/dL   HDL 54  >39 mg/dL   Total CHOL/HDL Ratio 2.6     VLDL 21  0 - 40 mg/dL   LDL Cholesterol 66  0 - 99 mg/dL  URIC ACID      Result Value Range   Uric Acid, Serum 3.5  2.4 - 7.0 mg/dL  POCT GLYCOSYLATED HEMOGLOBIN (HGB A1C)      Result Value Range   Hemoglobin A1C 5.3       BP 134/62  Pulse 75  Temp(Src) 98 F (36.7 C) (Oral)  Resp 18  Ht 5\' 1"  (1.549 m)  Wt 125 lb (56.7 kg)  BMI 23.63 kg/m2  SpO2 100% Results for orders placed in visit on 11/29/13  GLUCOSE, POCT (MANUAL RESULT ENTRY)      Result Value Range   POC Glucose 180 (*) 70 - 99 mg/dl  POCT GLYCOSYLATED HEMOGLOBIN (HGB A1C)      Result Value Range   Hemoglobin A1C 5.7     Assessment & Plan:  10:33 AM- Advised patient to try taking melatonin at home to help her sleep. Ordered blood labs. Discussed treatment plan with patient at bedside and patient verbalized agreement. Her sugar was high at 180 but her average sugar is good at 5.7. Have advised her to take melatonin along with her Ambien she takes at night. I did check her muscle enzymes but she is not currently on a statin. She does see her cardiologist Dr. Gwenlyn Found on a regular basis.

## 2013-11-29 NOTE — Patient Instructions (Signed)
You can take Tylenol for your joint discomfort and muscle aches. I will call you when I get back your blood test results. Please add melatonin 1 mg 2 tablets at night along with you Ambien.

## 2014-01-22 ENCOUNTER — Other Ambulatory Visit: Payer: Self-pay | Admitting: Physician Assistant

## 2014-01-22 NOTE — Telephone Encounter (Signed)
Dr [name], do you want to RF through 6 mos from last OV in 11/2013, or have pt RTC for lipid test which she hasn't had since 07/2013?

## 2014-01-27 ENCOUNTER — Other Ambulatory Visit (HOSPITAL_COMMUNITY): Payer: Self-pay | Admitting: Cardiovascular Disease

## 2014-01-27 DIAGNOSIS — I739 Peripheral vascular disease, unspecified: Secondary | ICD-10-CM

## 2014-02-05 ENCOUNTER — Ambulatory Visit (HOSPITAL_COMMUNITY)
Admission: RE | Admit: 2014-02-05 | Discharge: 2014-02-05 | Disposition: A | Discharge status: Home / Self Care | Payer: Medicare Other | Source: Ambulatory Visit | Attending: Cardiovascular Disease | Admitting: Cardiovascular Disease

## 2014-02-05 DIAGNOSIS — I739 Peripheral vascular disease, unspecified: Secondary | ICD-10-CM

## 2014-02-05 DIAGNOSIS — I6529 Occlusion and stenosis of unspecified carotid artery: Secondary | ICD-10-CM

## 2014-02-05 NOTE — Progress Notes (Signed)
Carotid Duplex Completed. Thais Silberstein, BS, RDMS, RVT  

## 2014-02-23 ENCOUNTER — Telehealth: Payer: Self-pay | Admitting: *Deleted

## 2014-02-23 ENCOUNTER — Encounter: Payer: Self-pay | Admitting: *Deleted

## 2014-02-23 DIAGNOSIS — I6529 Occlusion and stenosis of unspecified carotid artery: Secondary | ICD-10-CM

## 2014-02-23 NOTE — Telephone Encounter (Signed)
Order placed for repeat carotid dopplers in 1 year  

## 2014-02-23 NOTE — Telephone Encounter (Signed)
Message copied by Chauncy Lean on Sun Feb 23, 2014 10:47 PM ------      Message from: Lorretta Harp      Created: Sat Feb 22, 2014 11:18 AM       No change from prior study. Repeat in 12 months. ------

## 2014-02-24 DIAGNOSIS — N2581 Secondary hyperparathyroidism of renal origin: Secondary | ICD-10-CM | POA: Diagnosis not present

## 2014-02-24 DIAGNOSIS — N183 Chronic kidney disease, stage 3 unspecified: Secondary | ICD-10-CM | POA: Diagnosis not present

## 2014-02-26 ENCOUNTER — Ambulatory Visit (INDEPENDENT_AMBULATORY_CARE_PROVIDER_SITE_OTHER): Payer: Medicare Other | Admitting: Cardiovascular Disease

## 2014-02-26 ENCOUNTER — Encounter: Payer: Self-pay | Admitting: Cardiovascular Disease

## 2014-02-26 VITALS — BP 140/68 | HR 78 | Ht 61.0 in | Wt 124.8 lb

## 2014-02-26 DIAGNOSIS — I779 Disorder of arteries and arterioles, unspecified: Secondary | ICD-10-CM | POA: Diagnosis not present

## 2014-02-26 DIAGNOSIS — E785 Hyperlipidemia, unspecified: Secondary | ICD-10-CM | POA: Diagnosis not present

## 2014-02-26 DIAGNOSIS — I10 Essential (primary) hypertension: Secondary | ICD-10-CM | POA: Diagnosis not present

## 2014-02-26 DIAGNOSIS — I739 Peripheral vascular disease, unspecified: Secondary | ICD-10-CM | POA: Diagnosis not present

## 2014-02-26 DIAGNOSIS — I6529 Occlusion and stenosis of unspecified carotid artery: Secondary | ICD-10-CM

## 2014-02-26 NOTE — Progress Notes (Signed)
02/26/2014 Gabrielle [name]   1935-10-16  [id number]  Primary Physician DAUB, Lina Sayre, MD Primary Cardiologist: Lorretta Harp MD Renae Gloss   HPI:  The patient is a 79 year old, thin-appearing, married Caucasian female, mother of 2, grandmother to 5 grandchildren who I last saw 6 months ago. She has a history of PVOD with known carotid disease status post left carotid endarterectomy back in 2004. She has renovascular disease as well as lower extremity occlusive disease status post left renal artery stenting by myself back in 2004 with a known occluded right renal artery. She has bilaterally occluded SFAs, as well as moderate iliac disease left greater than right. She denies chest pain or shortness of breath. She does complain of mild claudication.. We have been following her Dopplers. Her last functional study performed July 2012 was nonischemic. We'll follow her carotid, renal and lower extremity arterial Doppler studies on a routine basis. Dr. [name] follows her lipid profile.    Current Outpatient Prescriptions  Medication Sig Dispense Refill  . acetaminophen (TYLENOL) 500 MG tablet Take 1,000 mg by mouth every 6 (six) hours as needed. For pain      . amLODipine (NORVASC) 5 MG tablet Take 1 tablet (5 mg total) by mouth daily.  90 tablet  3  . aspirin 81 MG tablet Take 81 mg by mouth daily.      . clopidogrel (PLAVIX) 75 MG tablet Take 1 tablet (75 mg total) by mouth daily.  90 tablet  3  . glipiZIDE (GLUCOTROL) 5 MG tablet Take 0.5 tablets (2.5 mg total) by mouth daily after breakfast.  45 tablet  3  . lisinopril (PRINIVIL,ZESTRIL) 20 MG tablet Take 1 tablet (20 mg total) by mouth daily.  90 tablet  3  . pravastatin (PRAVACHOL) 40 MG tablet TAKE ONE TABLET BY MOUTH ONCE DAILY  30 tablet  4  . zolpidem (AMBIEN) 10 MG tablet Take 1 tablet (10 mg total) by mouth at bedtime as needed for sleep.  90 tablet  1  . allopurinol (ZYLOPRIM) 300 MG tablet Take 1 tablet (300 mg total)  by mouth daily. PATIENT NEEDS OFFICE VISIT FOR ADDITIONAL REFILLS  30 tablet  0  . Blood Glucose Monitoring Suppl (FREESTYLE LITE) DEVI Use Glucose meter to test blood sugar once daily in morning. Dx code 250.00  1 each  0  . glucose monitoring kit (FREESTYLE) monitoring kit Use as directed. Dx code 250.00.  1 each  0   No current facility-administered medications for this visit.    Allergies  Allergen Reactions  . Codeine Nausea And Vomiting  . Fentanyl   . Hycodan [Hydrocodone-Homatropine] Itching  . Lacosamide      Other name is, VIMPAT  . Levetiracetam Other (See Comments)    Strange feelings in head  . Metformin And Related   . Metoprolol   . Morphine And Related Itching  . Tessalon [Benzonatate]   . Other Rash    BETA BLOCKER    History   Social History  . Marital Status: Married    Spouse Name: N/A    Number of Children: N/A  . Years of Education: N/A   Occupational History  . Not on file.   Social History Main Topics  . Smoking status: Former Smoker    Quit date: 12/26/2005  . Smokeless tobacco: Not on file  . Alcohol Use: No  . Drug Use: No  . Sexual Activity: No   Other Topics Concern  . Not on  file   Social History Narrative  . No narrative on file     Review of Systems: General: negative for chills, fever, night sweats or weight changes.  Cardiovascular: negative for chest pain, dyspnea on exertion, edema, orthopnea, palpitations, paroxysmal nocturnal dyspnea or shortness of breath Dermatological: negative for rash Respiratory: negative for cough or wheezing Urologic: negative for hematuria Abdominal: negative for nausea, vomiting, diarrhea, bright red blood per rectum, melena, or hematemesis Neurologic: negative for visual changes, syncope, or dizziness All other systems reviewed and are otherwise negative except as noted above.    Blood pressure 140/68, pulse 78, height _0  (1.549 m), weight 56.609 kg (124 lb 12.8 oz).  General  appearance: alert and no distress Neck: no adenopathy, no JVD, supple, symmetrical, trachea midline, thyroid not enlarged, symmetric, no tenderness/mass/nodules and soft right carotid bruit Lungs: clear to auscultation bilaterally Heart: regular rate and rhythm, S1, S2 normal, no murmur, click, rub or gallop Extremities: extremities normal, atraumatic, no cyanosis or edema  EKG normal sinus rhythm at 78 without ST or T wave changes  ASSESSMENT AND PLAN:   Carotid artery disease Status post left carotid endarterectomy after I performed angiography 10/23/03. We followed by duplex ultrasound. She does have a widely patent left carotid endarterectomy site with moderate right internal carotid artery stenosis which has remained  Essential hypertension, benign Controlled on current medications  Hyperlipidemia On statin therapy followed by her PCP Dr. [name]   PAD (peripheral artery disease) History of right renal artery occlusion documented angiographically with left renal artery PTA and stenting back in 2004. We followed this by duplex ultrasound. She also has known occluded SFAs bilaterally with moderate iliac disease bilaterally however Dopplers have shown this to be stable with ABIs of in the mid 0.7 range bilaterally with a patent iliac arteries. She does have mild claudication however.      Lorretta Harp MD FACP,FACC,FAHA, [facility] 02/26/2014 11:03 AM

## 2014-02-26 NOTE — Assessment & Plan Note (Addendum)
History of right renal artery occlusion documented angiographically with left renal artery PTA and stenting back in 2004. We followed this by duplex ultrasound. She also has known occluded SFAs bilaterally with moderate iliac disease bilaterally however Dopplers have shown this to be stable with ABIs of in the mid 0.7 range bilaterally with a patent iliac arteries. She does have mild claudication however.

## 2014-02-26 NOTE — Assessment & Plan Note (Signed)
Controlled on current medications 

## 2014-02-26 NOTE — Patient Instructions (Signed)
Your physician wants you to follow-up in: 6 months with Laura Ingold NP and 1 year with Dr Berry.  You will receive a reminder letter in the mail two months in advance. If you don't receive a letter, please call our office to schedule the follow-up appointment.  

## 2014-02-26 NOTE — Assessment & Plan Note (Signed)
Status post left carotid endarterectomy after I performed angiography 10/23/03. We followed by duplex ultrasound. She does have a widely patent left carotid endarterectomy site with moderate right internal carotid artery stenosis which has remained

## 2014-02-26 NOTE — Assessment & Plan Note (Signed)
On statin therapy followed by her PCP Dr. [name]

## 2014-03-04 ENCOUNTER — Ambulatory Visit (INDEPENDENT_AMBULATORY_CARE_PROVIDER_SITE_OTHER): Payer: Medicare Other | Admitting: Family Medicine

## 2014-03-04 VITALS — BP 168/52 | HR 101 | Temp 98.6°F | Resp 18 | Wt 125.0 lb

## 2014-03-04 DIAGNOSIS — M5430 Sciatica, unspecified side: Secondary | ICD-10-CM | POA: Diagnosis not present

## 2014-03-04 DIAGNOSIS — N2581 Secondary hyperparathyroidism of renal origin: Secondary | ICD-10-CM | POA: Diagnosis not present

## 2014-03-04 DIAGNOSIS — I129 Hypertensive chronic kidney disease with stage 1 through stage 4 chronic kidney disease, or unspecified chronic kidney disease: Secondary | ICD-10-CM | POA: Diagnosis not present

## 2014-03-04 DIAGNOSIS — E119 Type 2 diabetes mellitus without complications: Secondary | ICD-10-CM | POA: Diagnosis not present

## 2014-03-04 DIAGNOSIS — D631 Anemia in chronic kidney disease: Secondary | ICD-10-CM | POA: Diagnosis not present

## 2014-03-04 DIAGNOSIS — R1013 Epigastric pain: Secondary | ICD-10-CM

## 2014-03-04 DIAGNOSIS — N183 Chronic kidney disease, stage 3 unspecified: Secondary | ICD-10-CM | POA: Diagnosis not present

## 2014-03-04 LAB — POCT GLYCOSYLATED HEMOGLOBIN (HGB A1C): Hemoglobin A1C: 5.7

## 2014-03-04 MED ORDER — PREDNISONE 20 MG PO TABS: 40.0000 mg | ORAL_TABLET | Freq: Every day | ORAL | Status: DC

## 2014-03-04 NOTE — Patient Instructions (Addendum)
Sciatica Sciatica is pain, weakness, numbness, or tingling along the path of the sciatic nerve. The nerve starts in the lower back and runs down the back of each leg. The nerve controls the muscles in the lower leg and in the back of the knee, while also providing sensation to the back of the thigh, lower leg, and the sole of your foot. Sciatica is a symptom of another medical condition. For instance, nerve damage or certain conditions, such as a herniated disk or bone spur on the spine, pinch or put pressure on the sciatic nerve. This causes the pain, weakness, or other sensations normally associated with sciatica. Generally, sciatica only affects one side of the body. CAUSES   Herniated or slipped disc.  Degenerative disk disease.  A pain disorder involving the narrow muscle in the buttocks (piriformis syndrome).  Pelvic injury or fracture.  Pregnancy.  Tumor (rare). SYMPTOMS  Symptoms can vary from mild to very severe. The symptoms usually travel from the low back to the buttocks and down the back of the leg. Symptoms can include:  Mild tingling or dull aches in the lower back, leg, or hip.  Numbness in the back of the calf or sole of the foot.  Burning sensations in the lower back, leg, or hip.  Sharp pains in the lower back, leg, or hip.  Leg weakness.  Severe back pain inhibiting movement. These symptoms may get worse with coughing, sneezing, laughing, or prolonged sitting or standing. Also, being overweight may worsen symptoms. DIAGNOSIS  Your caregiver will perform a physical exam to look for common symptoms of sciatica. He or she may ask you to do certain movements or activities that would trigger sciatic nerve pain. Other tests may be performed to find the cause of the sciatica. These may include:  Blood tests.  X-rays.  Imaging tests, such as an MRI or CT scan. TREATMENT  Treatment is directed at the cause of the sciatic pain. Sometimes, treatment is not necessary  and the pain and discomfort goes away on its own. If treatment is needed, your caregiver may suggest:  Over-the-counter medicines to relieve pain.  Prescription medicines, such as anti-inflammatory medicine, muscle relaxants, or narcotics.  Applying heat or ice to the painful area.  Steroid injections to lessen pain, irritation, and inflammation around the nerve.  Reducing activity during periods of pain.  Exercising and stretching to strengthen your abdomen and improve flexibility of your spine. Your caregiver may suggest losing weight if the extra weight makes the back pain worse.  Physical therapy.  Surgery to eliminate what is pressing or pinching the nerve, such as a bone spur or part of a herniated disk. HOME CARE INSTRUCTIONS   Only take over-the-counter or prescription medicines for pain or discomfort as directed by your caregiver.  Apply ice to the affected area for 20 minutes, 3 4 times a day for the first 48 72 hours. Then try heat in the same way.  Exercise, stretch, or perform your usual activities if these do not aggravate your pain.  Attend physical therapy sessions as directed by your caregiver.  Keep all follow-up appointments as directed by your caregiver.  Do not wear high heels or shoes that do not provide proper support.  Check your mattress to see if it is too soft. A firm mattress may lessen your pain and discomfort. SEEK IMMEDIATE MEDICAL CARE IF:   You lose control of your bowel or bladder (incontinence).  You have increasing weakness in the lower back,   pelvis, buttocks, or legs.  You have redness or swelling of your back.  You have a burning sensation when you urinate.  You have pain that gets worse when you lie down or awakens you at night.  Your pain is worse than you have experienced in the past.  Your pain is lasting longer than 4 weeks.  You are suddenly losing weight without reason. MAKE SURE YOU:  Understand these  instructions.  Will watch your condition.  Will get help right away if you are not doing well or get worse. Document Released: 12/06/2001 Document Revised: 06/12/2012 Document Reviewed: 04/22/2012 [facility] Patient Information 2014 [place]. Herpes Zoster Virus Vaccine What is this medicine? HERPES ZOSTER VIRUS VACCINE (HUR peez ZOS ter vahy ruhs vak SEEN) is a vaccine. It is used to prevent shingles in adults 77 years old and over. This vaccine is not used to treat shingles or nerve pain from shingles. This medicine may be used for other purposes; ask your health care provider or pharmacist if you have questions. COMMON BRAND NAME(S): Zostavax What should I tell my health care provider before I take this medicine? They need to know if you have any of these conditions: -cancer like leukemia or lymphoma -immune system problems or therapy -infection with fever -tuberculosis -an unusual or allergic reaction to vaccines, neomycin, gelatin, other medicines, foods, dyes, or preservatives -pregnant or trying to get pregnant -breast-feeding How should I use this medicine? This vaccine is for injection under the skin. It is given by a health care professional. Talk to your pediatrician regarding the use of this medicine in children. This medicine is not approved for use in children. Overdosage: If you think you have taken too much of this medicine contact a poison control center or emergency room at once. NOTE: This medicine is only for you. Do not share this medicine with others. What if I miss a dose? This does not apply. What may interact with this medicine? Do not take this medicine with any of the following medications: -adalimumab -anakinra -etanercept -infliximab -medicines to treat cancer -medicines that suppress your immune system This medicine may also interact with the following medications: -immunoglobulins -steroid medicines like prednisone or cortisone This list may  not describe all possible interactions. Give your health care provider a list of all the medicines, herbs, non-prescription drugs, or dietary supplements you use. Also tell them if you smoke, drink alcohol, or use illegal drugs. Some items may interact with your medicine. What should I watch for while using this medicine? Visit your doctor for regular check ups. This vaccine, like all vaccines, may not fully protect everyone. After receiving this vaccine it may be possible to pass chickenpox infection to others. Avoid people with immune system problems, pregnant women who have not had chickenpox, and newborns of women who have not had chickenpox. Talk to your doctor for more information. What side effects may I notice from receiving this medicine? Side effects that you should report to your doctor or health care professional as soon as possible: -allergic reactions like skin rash, itching or hives, swelling of the face, lips, or tongue -breathing problems -feeling faint or lightheaded, falls -fever, flu-like symptoms -pain, tingling, numbness in the hands or feet -swelling of the ankles, feet, hands -unusually weak or tired Side effects that usually do not require medical attention (report to your doctor or health care professional if they continue or are bothersome): -aches or pains -chickenpox-like rash -diarrhea -headache -loss of appetite -nausea, vomiting -redness,  pain, swelling at site where injected -runny nose This list may not describe all possible side effects. Call your doctor for medical advice about side effects. You may report side effects to FDA at 1-800-FDA-1088. Where should I keep my medicine? This drug is given in a hospital or clinic and will not be stored at home. NOTE: This sheet is a summary. It may not cover all possible information. If you have questions about this medicine, talk to your doctor, pharmacist, or health care provider.  2014, Elsevier/Gold Standard.  (2010-05-31 17:43:50)

## 2014-03-04 NOTE — Progress Notes (Addendum)
° °  Subjective:  This chart was scribed for [name], MD  by Ludger Nutting, ED Scribe. This patient was seen in room 5 and the patient's care was started 11:27 AM.    Patient ID: Gabrielle [name], female    DOB: October 12, 1935, 79 y.o.   MRN: [id number]  HPI  HPI Comments: Gabrielle [name] is a 79 y.o. female who presents to [facility] complaining of 3-4 days of constant, unchanged right buttock pain that radiates down the right leg. She denies any known falls or injuries. Pt states pain is worse with sitting and standing up. She states the pain is mild in the morning and worsens as the days progresses. Pt states she has to walk hunched over as a result of the pain.   Pt also reports having intermittent pain to the back of her head for over 1 month. She also saw her kidney specialist today but had blood work done last week. He told her she may need a colonoscopy. She denies abdominal pain, melena, or hematochezia.   She had flex sigmoid 2010 by Dr. Benson Norway  Pt also small irritated area on the right eye lid.   Pt has not had the shingles vaccine and would like it if Medicaid covers it.   Review of Systems  Gastrointestinal: Negative for abdominal pain, blood in stool and anal bleeding.  Musculoskeletal: Positive for arthralgias (right buttock).       Objective:   Physical Exam  Nursing note and vitals reviewed. Constitutional: She is oriented to person, place, and time. She appears well-developed and well-nourished.  HENT:  Head: Normocephalic and atraumatic.  Eyes:  Small, irritated area lateral to the right eyelid  Cardiovascular: Normal rate.   Pulmonary/Chest: Effort normal.  Abdominal: She exhibits no distension.  Musculoskeletal:  Straight leg raise + right side only Reflexes on knee and ankle are normal.  Right gluteal area and back non tender No scoliosis Gait stable  No muscle wasting in the lower extremity.  Neurological: She is alert and oriented to person, place, and time.    Skin: Skin is warm and dry.  Psychiatric: She has a normal mood and affect.  Small amount erythema in skin wrinkle lateral to right eye.       Assessment & Plan:   Sciatica - Plan: predniSONE (DELTASONE) 20 MG tablet, POCT glycosylated hemoglobin (Hb A1C)  Abdominal pain, epigastric - Plan: Ambulatory referral to Gastroenterology  Diabetes - Plan: POCT glycosylated hemoglobin (Hb A1C)  Signed, [name], MD

## 2014-03-06 ENCOUNTER — Encounter: Payer: Self-pay | Admitting: Gastroenterology

## 2014-03-13 ENCOUNTER — Ambulatory Visit (INDEPENDENT_AMBULATORY_CARE_PROVIDER_SITE_OTHER): Payer: Medicare Other | Admitting: Emergency Medicine

## 2014-03-13 VITALS — BP 134/84 | HR 84 | Temp 98.0°F | Resp 16 | Ht 60.0 in | Wt 129.4 lb

## 2014-03-13 DIAGNOSIS — M5430 Sciatica, unspecified side: Secondary | ICD-10-CM

## 2014-03-13 DIAGNOSIS — M549 Dorsalgia, unspecified: Secondary | ICD-10-CM | POA: Diagnosis not present

## 2014-03-13 DIAGNOSIS — S7400XA Injury of sciatic nerve at hip and thigh level, unspecified leg, initial encounter: Secondary | ICD-10-CM | POA: Diagnosis not present

## 2014-03-13 MED ORDER — PREDNISONE 20 MG PO TABS: 40.0000 mg | ORAL_TABLET | Freq: Every day | ORAL | Status: DC

## 2014-03-13 NOTE — Progress Notes (Signed)
   Subjective:    Patient ID: Gabrielle [name], female    DOB: 1935-12-06, 79 y.o.   MRN: [id number]  HPI Has had right hip pain , had course of prednisone. Felt great while on medication, now pain is back. Trouble standing for long periods. Pain is low and runs down rt leg. Kidney doctor said she can only take tylenol. Rt rash/sore between toes, is painful. When she took the prednisone she without great pain abo after 2-3 days of being off prednisone all of her symptoms return.ut 24 hours.    Review of Systems     Objective:   Physical Exam  patient is alert and cooperative. Her neck is supple. Chest is clear. Heart was regular great without murmurs. There is mild tenderness over the lower back in the right SI area. Straight leg raising was positive on the right at 90. Reflexes were symmetrical and there is no motor weakness        Assessment & Plan:   She will be treated with 5 days of prednisone followed by Tylenol.

## 2014-03-27 ENCOUNTER — Ambulatory Visit (INDEPENDENT_AMBULATORY_CARE_PROVIDER_SITE_OTHER): Payer: Medicare Other | Admitting: Emergency Medicine

## 2014-03-27 VITALS — BP 190/80 | HR 89 | Temp 98.0°F | Resp 16 | Ht 61.5 in | Wt 127.6 lb

## 2014-03-27 DIAGNOSIS — I10 Essential (primary) hypertension: Secondary | ICD-10-CM | POA: Diagnosis not present

## 2014-03-27 DIAGNOSIS — M5430 Sciatica, unspecified side: Secondary | ICD-10-CM

## 2014-03-27 DIAGNOSIS — M549 Dorsalgia, unspecified: Secondary | ICD-10-CM | POA: Diagnosis not present

## 2014-03-27 MED ORDER — PREDNISONE 20 MG PO TABS: ORAL_TABLET | ORAL | Status: DC

## 2014-03-27 MED ORDER — AMLODIPINE BESYLATE 10 MG PO TABS: 10.0000 mg | ORAL_TABLET | Freq: Every day | ORAL | Status: DC

## 2014-03-27 NOTE — Patient Instructions (Signed)
A referral is being made to Dr. Nelva Bush. I have increased her blood pressure medication. Take prednisone for 5 days only but only one pill a  day.

## 2014-03-27 NOTE — Progress Notes (Addendum)
This chart was scribed for [name], MD by Stacy Gardner, [facility] Scribe. The patient was seen in room and the patient's care was started at 6:59 PM.   Hip Pain   Leg Pain    HPI Comments: Gabrielle [name] is a 79 y.o. female who arrives to the [facility] complaining of chronic, moderate, gradually worsening right lower back pain that radiates into her right hip and down her right leg.The pain feels like pressure and is worse with walking. She mentions as the day progresses the pain gets worse.  Nothing seems to make the pain better. Pt has a hx of sciatica.  She injured the right side of her face while opening her trunk yesterday.  The side is bandaged and not bleeding.      Review of Systems  Musculoskeletal: Positive for back pain, joint pain and myalgias.    Physical Exam there is tenderness to palpation over the lower lumbar spine. There is tenderness in the right SI joint. There is tenderness along the right buttocks. Deep tendon reflexes are absent. There is no focal weakness however patient does walk with a limp.   Filed Vitals:   03/27/14 1835  BP: 190/80  Pulse: 89  Temp: 98 F (36.7 C)  Resp: 16    Assessment patient has sciatica involving the right leg. We'll treat with a short taper of prednisone. I also increased her Norvasc to 10 mg a day due to her elevated blood pressure. Referral will be made to Dr. Nelva Bush   COORDINATION OF CARE:  7:02 PM Discussed course of care with pt . Pt understands and agrees.    Plan Referral to Dr. Nelva Bush. Short taper prednisone. Increase Norvasc to 10 mg a day pressure was 202/86. I'm concerned with getting the prednisone he could make anything worse. We'll give her just a short taper of prednisone 20 mg 1 a day for 5 days and make referral to

## 2014-04-03 ENCOUNTER — Ambulatory Visit (INDEPENDENT_AMBULATORY_CARE_PROVIDER_SITE_OTHER): Payer: Medicare Other | Admitting: Emergency Medicine

## 2014-04-03 VITALS — BP 130/80 | HR 79 | Temp 97.9°F | Resp 16 | Ht 60.0 in | Wt 129.0 lb

## 2014-04-03 DIAGNOSIS — I10 Essential (primary) hypertension: Secondary | ICD-10-CM

## 2014-04-03 DIAGNOSIS — M549 Dorsalgia, unspecified: Secondary | ICD-10-CM | POA: Diagnosis not present

## 2014-04-03 NOTE — Progress Notes (Signed)
Subjective:    Patient ID: Gabrielle [name], female    DOB: 1935-04-12, 79 y.o.   MRN: [id number]  HPI This chart was scribed for Remo Lipps Quantavia Frith-MD, by Lovena Le Day, Scribe. This patient was seen in room 13 and the patient's care was started at 8:58 AM.  HPI Comments: Gabrielle [name] is a 79 y.o. female who presents to the [facility] for follow up of her back pain; she was seen here x3 weeks ago for same.  She was referred to see Dr. Nelva Bush at [facility] for this problem. In her last visit she was tx w/x5 days prednisone followed by Tylenol. She reports had temporary relief. She reports pain worse with movement. She states has a hx of ruptured disk but now the pain feels lower down her back than it used to be. She states her pain feels referred from her hip.   Past Medical History  Diagnosis Date  . Diabetes mellitus     1990  . Essential hypertension, benign   . Hyperlipidemia   . History of anemia   . History of gout   . Claudication   . CVA (cerebral vascular accident)     LOC DIZZINESS 2008  . Azotemia   . Neck pain   . Renal cell cancer 2001  . Seizure   . PAD (peripheral artery disease)     hx. LCEA, hx Lt renal art. stenting, occl rt renal artery, occluded bil SFAs, moderatie iliac disease,    . Arthritis   . Carotid artery disease     Allergies  Allergen Reactions  . Codeine Nausea And Vomiting  . Fentanyl   . Hycodan [Hydrocodone-Homatropine] Itching  . Lacosamide      Other name is, VIMPAT  . Levetiracetam Other (See Comments)    Strange feelings in head  . Metformin And Related   . Metoprolol   . Morphine And Related Itching  . Tessalon [Benzonatate]   . Other Rash    BETA BLOCKER    No orders of the defined types were placed in this encounter.    Review of Systems  Constitutional: Negative for fever and chills.  Respiratory: Negative for cough and shortness of breath.   Cardiovascular: Negative for chest pain.    Gastrointestinal: Negative for abdominal pain.  Musculoskeletal: Positive for back pain.      Objective:   Physical Exam  Nursing note and vitals reviewed. Constitutional: She is oriented to person, place, and time. She appears well-developed and well-nourished. No distress.  HENT:  Head: Normocephalic and atraumatic.  Eyes: Conjunctivae are normal. Right eye exhibits no discharge. Left eye exhibits no discharge.  Neck: Normal range of motion.  Cardiovascular: Normal rate.   Pulmonary/Chest: Effort normal. No respiratory distress.  Musculoskeletal: Normal range of motion. She exhibits no edema.  Neurological: She is alert and oriented to person, place, and time.  Skin: Skin is warm and dry.  Psychiatric: She has a normal mood and affect. Thought content normal.  Back there is minimal tenderness over the lower back. Her reflexes are 2+ and symmetrical in the knees and absent in the ankles. She has good range of motion of the hips. Triage Vitals: BP 130/80  Pulse 79  Temp(Src) 97.9 F (36.6 C) (Oral)  Resp 16  Ht 5' (1.524 m)  Wt 129 lb (58.514 kg)  BMI 25.19 kg/m2  SpO2 100%     Assessment & Plan:  DIAGNOSTIC STUDIES: Oxygen Saturation is 100% on  room air, normal by my interpretation.    COORDINATION OF CARE: Blood pressure is much improved. Will check on status of her referral to Dr. Nelva Bush . Hopefully she can get in to be seen in the near future to help with her back and hips I personally performed the services described in this documentation, which was scribed in my presence. The recorded information has been reviewed and is accurate.

## 2014-04-07 ENCOUNTER — Other Ambulatory Visit: Payer: Self-pay | Admitting: Family Medicine

## 2014-04-07 NOTE — Telephone Encounter (Signed)
Gabrielle [name] is only supposed to be on Ambien 5 mg at bedtime. This is the maximum dose she can take please forward this back to me and it can be corrected

## 2014-04-07 NOTE — Telephone Encounter (Signed)
Dr [name], it appears that you were the last provider to see pt for insomnia and you are PCP, so I'm sending this RF req to you. If Dr Carlean Jews manages this regularly for pt, please advise or resend on to him.

## 2014-04-08 ENCOUNTER — Other Ambulatory Visit: Payer: Self-pay | Admitting: Radiology

## 2014-04-08 ENCOUNTER — Telehealth: Payer: Self-pay

## 2014-04-08 DIAGNOSIS — M545 Low back pain, unspecified: Secondary | ICD-10-CM | POA: Diagnosis not present

## 2014-04-08 DIAGNOSIS — M5137 Other intervertebral disc degeneration, lumbosacral region: Secondary | ICD-10-CM | POA: Diagnosis not present

## 2014-04-08 DIAGNOSIS — G4700 Insomnia, unspecified: Secondary | ICD-10-CM

## 2014-04-08 MED ORDER — ZOLPIDEM TARTRATE 5 MG PO TABS: 5.0000 mg | ORAL_TABLET | Freq: Every evening | ORAL | Status: DC | PRN

## 2014-04-08 NOTE — Telephone Encounter (Signed)
Patient states when she requested refill on zolpidem she asked for 10 mg and when she went to the pharmacy it was for 5 mg. She states now she has to take 2 pills. Can she get 10 mg? Cb# [phone]

## 2014-04-08 NOTE — Telephone Encounter (Signed)
Faxed Ambien to pharmacy

## 2014-04-08 NOTE — Telephone Encounter (Signed)
Sent refusal of 10 mg back to pharmacy. Dr [name] already sent in the correct 5 mg Rx. Called pharm and asked them to remove the 10 mg from their system bc Dr [name] stated that they keep sending for the 10 mg.

## 2014-04-08 NOTE — Telephone Encounter (Signed)
Called pt and advised that Dr [name] had me send in the 5 mg on purpose stating that he absolutely did not want her taking more than 5 mg Qhs. Pt stated that she has been on 10 mg for years w/out any SEs and that even at that dose she wakes up at 3:30 -4 am, and if she takes only 5 mg, she won't sleep at all. She stated that either Dr [name] or Dr L had told her that it should be OK for her to take the 10 mg since she has never had any SEs from it, but she doesn't remember which Dr it was. Advised pt I will send message back to Dr [name].

## 2014-04-08 NOTE — Telephone Encounter (Signed)
Patients Ambien refilled by Dr [name], she should ONLY TAKE 5mg  dose of this NEVER 10 mg. Dr [name] will discuss with patient she may need to try other medications. Ambien is not a good choice for elderly females.

## 2014-04-09 NOTE — Telephone Encounter (Signed)
the FDA  has a restriction now that only 5 mg can be prescribed. I would be more than happy to refer her to Dr. Brett Fairy  a sleep specialist to see if we can get some help with her sleep disorder. Until then she will have to be on only 5 mg a day.

## 2014-04-09 NOTE — Telephone Encounter (Signed)
Pt has been advised that she can not take 10mg  Ambien. Pt understands but is not able to stay sleeping at night. She wakes up after about 4 hours. Advised pt we would refer her to a sleep specialist.

## 2014-04-15 DIAGNOSIS — M5137 Other intervertebral disc degeneration, lumbosacral region: Secondary | ICD-10-CM | POA: Diagnosis not present

## 2014-04-18 ENCOUNTER — Ambulatory Visit: Payer: Medicare Other | Admitting: Gastroenterology

## 2014-04-21 DIAGNOSIS — M545 Low back pain, unspecified: Secondary | ICD-10-CM | POA: Diagnosis not present

## 2014-04-24 DIAGNOSIS — M545 Low back pain, unspecified: Secondary | ICD-10-CM | POA: Diagnosis not present

## 2014-04-28 DIAGNOSIS — M48061 Spinal stenosis, lumbar region without neurogenic claudication: Secondary | ICD-10-CM | POA: Diagnosis not present

## 2014-04-28 DIAGNOSIS — M545 Low back pain, unspecified: Secondary | ICD-10-CM | POA: Diagnosis not present

## 2014-05-03 ENCOUNTER — Ambulatory Visit (INDEPENDENT_AMBULATORY_CARE_PROVIDER_SITE_OTHER): Payer: Medicare Other | Admitting: Family Medicine

## 2014-05-03 VITALS — BP 142/64 | HR 87 | Temp 98.2°F | Resp 16 | Ht 59.21 in | Wt 126.4 lb

## 2014-05-03 DIAGNOSIS — R059 Cough, unspecified: Secondary | ICD-10-CM

## 2014-05-03 DIAGNOSIS — J3489 Other specified disorders of nose and nasal sinuses: Secondary | ICD-10-CM | POA: Diagnosis not present

## 2014-05-03 DIAGNOSIS — J329 Chronic sinusitis, unspecified: Secondary | ICD-10-CM

## 2014-05-03 DIAGNOSIS — R05 Cough: Secondary | ICD-10-CM

## 2014-05-03 DIAGNOSIS — R0981 Nasal congestion: Secondary | ICD-10-CM

## 2014-05-03 MED ORDER — AMOXICILLIN-POT CLAVULANATE 875-125 MG PO TABS: 1.0000 | ORAL_TABLET | Freq: Two times a day (BID) | ORAL | Status: DC

## 2014-05-03 MED ORDER — IPRATROPIUM BROMIDE 0.03 % NA SOLN: 2.0000 | Freq: Two times a day (BID) | NASAL | Status: DC

## 2014-05-03 NOTE — Progress Notes (Signed)
Chief Complaint:  Chief Complaint  Patient presents with  . Cough  . Nasal Congestion    HPI: Gabrielle [name] is a 79 y.o. female who is here for  1 day history of cough, hacking cough, no fevers or chills. She has had nasal congestion, [name] is worse. She did not get much sleep last night. She has itchy and scratchy throat.  She has not ear pain / She denies any SOb or wheezing.  NO chest pain. No diarhea or skin rashes.    Past Medical History  Diagnosis Date  . Diabetes mellitus     1990  . Essential hypertension, benign   . Hyperlipidemia   . History of anemia   . History of gout   . Claudication   . CVA (cerebral vascular accident)     LOC DIZZINESS 2008  . Azotemia   . Neck pain   . Renal cell cancer 2001  . Seizure   . PAD (peripheral artery disease)     hx. LCEA, hx Lt renal art. stenting, occl rt renal artery, occluded bil SFAs, moderatie iliac disease,    . Arthritis   . Carotid artery disease    Past Surgical History  Procedure Laterality Date  . Nephrectomy      RIGHT  . Ureteral stent placement      LEFT  . Carotid endarterectomy      LEFT  . Pv angiogram  2004    Lt renal artery stent   History   Social History  . Marital Status: Married    Spouse Name: N/A    Number of Children: N/A  . Years of Education: N/A   Social History Main Topics  . Smoking status: Former Smoker    Quit date: 12/26/2005  . Smokeless tobacco: None  . Alcohol Use: No  . Drug Use: No  . Sexual Activity: No   Other Topics Concern  . None   Social History Narrative  . None   Family History  Problem Relation Age of Onset  . Cancer Mother   . Heart disease Father    Allergies  Allergen Reactions  . Codeine Nausea And Vomiting  . Fentanyl   . Hycodan [Hydrocodone-Homatropine] Itching  . Lacosamide      Other name is, VIMPAT  . Levetiracetam Other (See Comments)    Strange feelings in head  . Metformin And Related   . Metoprolol   .  Morphine And Related Itching  . Tessalon [Benzonatate]   . Other Rash    BETA BLOCKER   Prior to Admission medications   Medication Sig Start Date End Date Taking? Authorizing Provider  acetaminophen (TYLENOL) 500 MG tablet Take 1,000 mg by mouth every 6 (six) hours as needed. For pain   Yes Historical Provider, MD  allopurinol (ZYLOPRIM) 300 MG tablet Take 1 tablet (300 mg total) by mouth daily. PATIENT NEEDS OFFICE VISIT FOR ADDITIONAL REFILLS 07/26/13  Yes Darlyne Russian, MD  amLODipine (NORVASC) 10 MG tablet Take 1 tablet (10 mg total) by mouth daily. 03/27/14  Yes Darlyne Russian, MD  aspirin 81 MG tablet Take 81 mg by mouth daily.   Yes Historical Provider, MD  Blood Glucose Monitoring Suppl (FREESTYLE LITE) DEVI Use Glucose meter to test blood sugar once daily in morning. Dx code 250.00 11/28/12  Yes Heather Elnora Morrison, PA-C  clopidogrel (PLAVIX) 75 MG tablet Take 1 tablet (75 mg total) by mouth daily. 08/14/13  Yes  [name], MD  glipiZIDE (GLUCOTROL) 5 MG tablet Take 0.5 tablets (2.5 mg total) by mouth daily after breakfast. 08/14/13  Yes [name], MD  glucose monitoring kit (FREESTYLE) monitoring kit Use as directed. Dx code 250.00. 11/29/12  Yes Dionne Bucy McClung, PA-C  lisinopril (PRINIVIL,ZESTRIL) 20 MG tablet Take 1 tablet (20 mg total) by mouth daily. 08/14/13  Yes [name], MD  pravastatin (PRAVACHOL) 40 MG tablet TAKE ONE TABLET BY MOUTH ONCE DAILY 01/22/14  Yes Darlyne Russian, MD  zolpidem (AMBIEN) 5 MG tablet Take 1 tablet (5 mg total) by mouth at bedtime as needed for sleep. 04/08/14  Yes Darlyne Russian, MD     ROS: The patient denies fevers, chills, night sweats, unintentional weight loss, chest pain, palpitations, wheezing, dyspnea on exertion, nausea, vomiting, abdominal pain, dysuria, hematuria, melena, numbness, weakness, or tingling.   All other systems have been reviewed and were otherwise negative with the exception of those mentioned in the HPI and as above.     PHYSICAL EXAM: Filed Vitals:   05/03/14 1204  BP: 142/64  Pulse: 87  Temp: 98.2 F (36.8 C)  Resp: 16   Filed Vitals:   05/03/14 1204  Height: 4' 11.21" (1.504 m)  Weight: 126 lb 6 oz (57.323 kg)   Body mass index is 25.34 kg/(m^2).  General: Alert, no acute distress HEENT:  Normocephalic, atraumatic, oropharynx patent. EOMI, PERRLA. TM nl, + nasal congestion,  +Sinus tenderness Cardiovascular:  Regular rate and rhythm, no rubs murmurs or gallops.  No Carotid bruits, radial pulse intact. No pedal edema.  Respiratory: Clear to auscultation bilaterally.  No wheezes, rales, or rhonchi.  No cyanosis, no use of accessory musculature GI: No organomegaly, abdomen is soft and non-tender, positive bowel sounds.  No masses. Skin: No rashes. Neurologic: Facial musculature symmetric. Psychiatric: Patient is appropriate throughout our interaction. Lymphatic: No cervical lymphadenopathy Musculoskeletal: Gait intact.   LABS: Results for orders placed in visit on 03/04/14  POCT GLYCOSYLATED HEMOGLOBIN (HGB A1C)      Result Value Ref Range   Hemoglobin A1C 5.7       EKG/XRAY:   Primary read interpreted by Dr. Marin Comment at [facility].   ASSESSMENT/PLAN: Encounter Diagnoses  Name Primary?  . Sinusitis Yes  . Nasal congestion   . Cough    RX Augmentin, Rx Atrovent Her creatinine is good enough to tolerate Augmentin F/u prn  Gross sideeffects, risk and benefits, and alternatives of medications d/w patient. Patient is aware that all medications have potential sideeffects and we are unable to predict every sideeffect or drug-drug interaction that may occur.  Glenford Bayley, DO 05/03/2014 2:03 PM

## 2014-05-03 NOTE — Patient Instructions (Signed)

## 2014-05-05 ENCOUNTER — Other Ambulatory Visit: Payer: Self-pay | Admitting: Family Medicine

## 2014-05-05 DIAGNOSIS — N281 Cyst of kidney, acquired: Secondary | ICD-10-CM | POA: Diagnosis not present

## 2014-05-07 NOTE — Telephone Encounter (Signed)
Called in.

## 2014-05-15 DIAGNOSIS — IMO0002 Reserved for concepts with insufficient information to code with codable children: Secondary | ICD-10-CM | POA: Diagnosis not present

## 2014-05-15 DIAGNOSIS — M545 Low back pain, unspecified: Secondary | ICD-10-CM | POA: Diagnosis not present

## 2014-05-15 DIAGNOSIS — M48061 Spinal stenosis, lumbar region without neurogenic claudication: Secondary | ICD-10-CM | POA: Diagnosis not present

## 2014-05-15 DIAGNOSIS — M5137 Other intervertebral disc degeneration, lumbosacral region: Secondary | ICD-10-CM | POA: Diagnosis not present

## 2014-05-20 ENCOUNTER — Encounter: Payer: Self-pay | Admitting: Gastroenterology

## 2014-05-20 ENCOUNTER — Other Ambulatory Visit (INDEPENDENT_AMBULATORY_CARE_PROVIDER_SITE_OTHER): Payer: Medicare Other

## 2014-05-20 ENCOUNTER — Ambulatory Visit (INDEPENDENT_AMBULATORY_CARE_PROVIDER_SITE_OTHER): Payer: Medicare Other | Admitting: Gastroenterology

## 2014-05-20 VITALS — BP 166/78 | HR 80 | Ht 59.0 in | Wt 125.6 lb

## 2014-05-20 DIAGNOSIS — D649 Anemia, unspecified: Secondary | ICD-10-CM | POA: Diagnosis not present

## 2014-05-20 DIAGNOSIS — I6529 Occlusion and stenosis of unspecified carotid artery: Secondary | ICD-10-CM | POA: Diagnosis not present

## 2014-05-20 LAB — IBC PANEL
Iron: 20 ug/dL — ABNORMAL LOW (ref 42–145)
Saturation Ratios: 5.3 % — ABNORMAL LOW (ref 20.0–50.0)
Transferrin: 268.5 mg/dL (ref 212.0–360.0)

## 2014-05-20 LAB — CBC WITH DIFFERENTIAL/PLATELET
Basophils Absolute: 0 10*3/uL (ref 0.0–0.1)
Basophils Relative: 0.3 % (ref 0.0–3.0)
Eosinophils Absolute: 0.1 10*3/uL (ref 0.0–0.7)
Eosinophils Relative: 1.1 % (ref 0.0–5.0)
HCT: 33.9 % — ABNORMAL LOW (ref 36.0–46.0)
Hemoglobin: 11.1 g/dL — ABNORMAL LOW (ref 12.0–15.0)
Lymphocytes Relative: 14.3 % (ref 12.0–46.0)
Lymphs Abs: 1.2 10*3/uL (ref 0.7–4.0)
MCHC: 32.7 g/dL (ref 30.0–36.0)
MCV: 84.2 fl (ref 78.0–100.0)
Monocytes Absolute: 0.7 10*3/uL (ref 0.1–1.0)
Monocytes Relative: 8.9 % (ref 3.0–12.0)
Neutro Abs: 6.3 10*3/uL (ref 1.4–7.7)
Neutrophils Relative %: 75.4 % (ref 43.0–77.0)
Platelets: 431 10*3/uL — ABNORMAL HIGH (ref 150.0–400.0)
RBC: 4.03 Mil/uL (ref 3.87–5.11)
RDW: 16.8 % — ABNORMAL HIGH (ref 11.5–15.5)
WBC: 8.3 10*3/uL (ref 4.0–10.5)

## 2014-05-20 LAB — FOLATE: Folate: >24.8 ng/mL (ref >5.9)

## 2014-05-20 LAB — FERRITIN: Ferritin: 9.3 ng/mL — ABNORMAL LOW (ref 10.0–291.0)

## 2014-05-20 MED ORDER — MOVIPREP 100 G PO SOLR: 1.0000 | Freq: Once | ORAL | Status: DC

## 2014-05-20 NOTE — Progress Notes (Signed)
HPI: This is a    very pleasant 79 year old woman whom I am meeting for the first time today.  She is not sure why she is here.    She tells me that her labs were abnormal from her renal doctor.  Something was low and she was told to see me.  I don't see records of any recent labs.  No record of labs from her renal doctor at the time of this visit. (the appointment was actually scheduled by her PCP under the diagnosis heading "abdominal pain")  During the appointment called over to her nephrology office and they faxed records including a CBC showing her hemoglobin was 10.2. There is no differential.  She never sees blood in her stool. Chronic constipation.  Overall stable weight.  She has never had colonoscopy or other colon cancer screening that she knows of.  Labs cbc showed Hb 11.8 in 2014.   Review of systems: Pertinent positive and negative review of systems were noted in the above HPI section. Complete review of systems was performed and was otherwise normal.    Past Medical History  Diagnosis Date  . Diabetes mellitus     1990  . Essential hypertension, benign   . Hyperlipidemia   . History of anemia   . History of gout   . Claudication   . CVA (cerebral vascular accident)     LOC DIZZINESS 2008  . Azotemia   . Neck pain   . Renal cell cancer 2001  . Seizure   . PAD (peripheral artery disease)     hx. LCEA, hx Lt renal art. stenting, occl rt renal artery, occluded bil SFAs, moderatie iliac disease,    . Arthritis   . Carotid artery disease     Past Surgical History  Procedure Laterality Date  . Nephrectomy      RIGHT  . Ureteral stent placement      LEFT  . Carotid endarterectomy      LEFT  . Pv angiogram  2004    Lt renal artery stent    Current Outpatient Prescriptions  Medication Sig Dispense Refill  . acetaminophen (TYLENOL) 500 MG tablet Take 1,000 mg by mouth every 6 (six) hours as needed. For pain      . amLODipine (NORVASC) 10 MG tablet Take 1  tablet (10 mg total) by mouth daily.  90 tablet  3  . aspirin 81 MG tablet Take 81 mg by mouth daily.      . Blood Glucose Monitoring Suppl (FREESTYLE LITE) DEVI Use Glucose meter to test blood sugar once daily in morning. Dx code 250.00  1 each  0  . clopidogrel (PLAVIX) 75 MG tablet Take 1 tablet (75 mg total) by mouth daily.  90 tablet  3  . glipiZIDE (GLUCOTROL) 5 MG tablet Take 0.5 tablets (2.5 mg total) by mouth daily after breakfast.  45 tablet  3  . glucose monitoring kit (FREESTYLE) monitoring kit Use as directed. Dx code 250.00.  1 each  0  . lisinopril (PRINIVIL,ZESTRIL) 20 MG tablet Take 1 tablet (20 mg total) by mouth daily.  90 tablet  3  . pravastatin (PRAVACHOL) 40 MG tablet TAKE ONE TABLET BY MOUTH ONCE DAILY  30 tablet  4  . zolpidem (AMBIEN) 10 MG tablet TAKE ONE TABLET BY MOUTH AT BEDTIME AS NEEDED FOR SLEEP  90 tablet  0   No current facility-administered medications for this visit.    Allergies as of 05/20/2014 - Review Complete 05/20/2014  Allergen Reaction Noted  . Codeine Nausea And Vomiting 01/17/2012  . Fentanyl  08/21/2012  . Hycodan [hydrocodone-homatropine] Itching 01/17/2012  . Lacosamide  03/16/2012  . Levetiracetam Other (See Comments) 03/16/2012  . Metformin and related  01/17/2012  . Metoprolol  01/17/2012  . Morphine and related Itching 08/21/2012  . Tessalon [benzonatate]  12/29/2012  . Other Rash 01/17/2012    Family History  Problem Relation Age of Onset  . Cancer Mother   . Heart disease Father     History   Social History  . Marital Status: Married    Spouse Name: N/A    Number of Children: 2  . Years of Education: N/A   Occupational History  . Retired    Social History Main Topics  . Smoking status: Former Smoker    Quit date: 12/26/2005  . Smokeless tobacco: Never Used  . Alcohol Use: No  . Drug Use: No  . Sexual Activity: No   Other Topics Concern  . Not on file   Social History Narrative  . No narrative on file        Physical Exam: BP 166/78  Pulse 80  Ht '4\' 11"'  (1.499 m)  Wt 125 lb 9.6 oz (56.972 kg)  BMI 25.35 kg/m2 Constitutional: generally well-appearing Psychiatric: alert and oriented x3 Eyes: extraocular movements intact Mouth: oral pharynx moist, no lesions Neck: supple no lymphadenopathy Cardiovascular: heart regular rate and rhythm Lungs: clear to auscultation bilaterally Abdomen: soft, nontender, nondistended, no obvious ascites, no peritoneal signs, normal bowel sounds Extremities: no lower extremity edema bilaterally Skin: no lesions on visible extremities    Assessment and plan: 79 y.o. female with  anemia  Her anemia may in part be due to chronic renal insufficiency. She has however never had colon cancer screening that she is aware of and she does know she has never had a colonoscopy. We will arrange for colonoscopy at her soonest convenience for anemia. I would also like to characterize her anemia a bit better with a full CBC and differential as well as iron studies.

## 2014-05-20 NOTE — Patient Instructions (Signed)
You will be set up for a colonoscopy for anemia. You will have labs checked today in the basement lab.  Please head down after you check out with the front desk  (cbc, iron studies).

## 2014-05-21 ENCOUNTER — Encounter: Payer: Self-pay | Admitting: *Deleted

## 2014-05-21 DIAGNOSIS — M48061 Spinal stenosis, lumbar region without neurogenic claudication: Secondary | ICD-10-CM | POA: Insufficient documentation

## 2014-06-03 DIAGNOSIS — M5137 Other intervertebral disc degeneration, lumbosacral region: Secondary | ICD-10-CM | POA: Diagnosis not present

## 2014-06-10 ENCOUNTER — Encounter: Payer: Self-pay | Admitting: Emergency Medicine

## 2014-06-10 ENCOUNTER — Ambulatory Visit (INDEPENDENT_AMBULATORY_CARE_PROVIDER_SITE_OTHER): Payer: Medicare Other | Admitting: Emergency Medicine

## 2014-06-10 VITALS — BP 130/72 | HR 70 | Temp 98.6°F | Resp 16 | Ht 60.0 in | Wt 122.6 lb

## 2014-06-10 DIAGNOSIS — R319 Hematuria, unspecified: Secondary | ICD-10-CM

## 2014-06-10 DIAGNOSIS — M549 Dorsalgia, unspecified: Secondary | ICD-10-CM

## 2014-06-10 DIAGNOSIS — I6529 Occlusion and stenosis of unspecified carotid artery: Secondary | ICD-10-CM

## 2014-06-10 DIAGNOSIS — IMO0002 Reserved for concepts with insufficient information to code with codable children: Secondary | ICD-10-CM

## 2014-06-10 DIAGNOSIS — M5410 Radiculopathy, site unspecified: Secondary | ICD-10-CM

## 2014-06-10 LAB — POCT URINALYSIS DIPSTICK
Glucose, UA: NEGATIVE
Ketones, UA: NEGATIVE
Nitrite, UA: NEGATIVE
Protein, UA: >=300
Spec Grav, UA: >=1.03
Urobilinogen, UA: 1
pH, UA: 5.5

## 2014-06-10 LAB — POCT UA - MICROSCOPIC ONLY
Casts, Ur, LPF, POC: NEGATIVE
Crystals, Ur, HPF, POC: NEGATIVE
Mucus, UA: POSITIVE
Yeast, UA: NEGATIVE

## 2014-06-10 MED ORDER — CEPHALEXIN 500 MG PO CAPS: 500.0000 mg | ORAL_CAPSULE | Freq: Two times a day (BID) | ORAL | Status: DC

## 2014-06-10 NOTE — Progress Notes (Addendum)
Subjective:    Patient ID: Gabrielle [name], female    DOB: Mar 20, 1935, 79 y.o.   MRN: [id number] This chart was scribed for Darlyne Russian, MD by Anastasia Pall, ED Scribe. This patient was seen in room 23 and the patient's care was started at 2:26 PM.  Chief Complaint  Patient presents with  . blood in urine, painful upon urination  . right hip pain   HPI Gabrielle [name] is a 79 y.o. female Pt with h/o right nephrectomy due to renal cell cancer and UTI 1 year ago presents with hematuria, with associated burning dysuria, onset this morning after feeling the need to urinate. She reports she saw her kidney MD a few weeks ago.   She also reports right hip pain, that radiates down to her right LE. She denies pain over her back. She reports intermittent numbness in her right LE when ambulating. She states she received an injection 1 week ago done by Dr. Nelva Bush. She reports h/o  back surgery. She denies abdominal pain, back pain, and any other symptoms.   Last year pt was treated for pneumonia in right upper lobe confirmed by CT. Follow up CXR showed significant clearing in 10/2013.   Patient Active Problem List   Diagnosis Date Noted  . Spinal stenosis of lumbar region 05/21/2014  . Carotid artery disease 02/26/2014  . PAD (peripheral artery disease)   . Renal cell cancer   . Diabetes mellitus   . Essential hypertension, benign   . Hyperlipidemia   . History of anemia   . CVA (cerebral vascular accident)   . Seizure    Past Medical History  Diagnosis Date  . Diabetes mellitus     1990  . Essential hypertension, benign   . Hyperlipidemia   . History of anemia   . History of gout   . Claudication   . CVA (cerebral vascular accident)     LOC DIZZINESS 2008  . Azotemia   . Neck pain   . Renal cell cancer 2001  . Seizure   . PAD (peripheral artery disease)     hx. LCEA, hx Lt renal art. stenting, occl rt renal artery, occluded bil SFAs, moderatie iliac disease,    . Arthritis     . Carotid artery disease    Past Surgical History  Procedure Laterality Date  . Nephrectomy      RIGHT  . Ureteral stent placement      LEFT  . Carotid endarterectomy      LEFT  . Pv angiogram  2004    Lt renal artery stent   Allergies  Allergen Reactions  . Codeine Nausea And Vomiting  . Fentanyl   . Hycodan [Hydrocodone-Homatropine] Itching  . Lacosamide      Other name is, VIMPAT  . Levetiracetam Other (See Comments)    Strange feelings in head  . Metformin And Related   . Metoprolol   . Morphine And Related Itching  . Tessalon [Benzonatate]   . Other Rash    BETA BLOCKER   Prior to Admission medications   Medication Sig Start Date End Date Taking? Authorizing Provider  acetaminophen (TYLENOL) 500 MG tablet Take 1,000 mg by mouth every 6 (six) hours as needed. For pain    Historical Provider, MD  amLODipine (NORVASC) 10 MG tablet Take 1 tablet (10 mg total) by mouth daily. 03/27/14   Darlyne Russian, MD  aspirin 81 MG tablet Take 81 mg by mouth daily.  Historical Provider, MD  Blood Glucose Monitoring Suppl (FREESTYLE LITE) DEVI Use Glucose meter to test blood sugar once daily in morning. Dx code 250.00 11/28/12   Collene Leyden, PA-C  clopidogrel (PLAVIX) 75 MG tablet Take 1 tablet (75 mg total) by mouth daily. 08/14/13   [name], MD  glipiZIDE (GLUCOTROL) 5 MG tablet Take 0.5 tablets (2.5 mg total) by mouth daily after breakfast. 08/14/13   [name], MD  glucose monitoring kit (FREESTYLE) monitoring kit Use as directed. Dx code 250.00. 11/29/12   Argentina Donovan, PA-C  lisinopril (PRINIVIL,ZESTRIL) 20 MG tablet Take 1 tablet (20 mg total) by mouth daily. 08/14/13   [name], MD  MOVIPREP 100 G SOLR Take 1 kit (200 g total) by mouth once. 05/20/14   Milus Banister, MD  pravastatin (PRAVACHOL) 40 MG tablet TAKE ONE TABLET BY MOUTH ONCE DAILY 01/22/14   Darlyne Russian, MD  zolpidem (AMBIEN) 10 MG tablet TAKE ONE TABLET BY MOUTH AT BEDTIME AS NEEDED FOR  SLEEP    [name], MD   Review of Systems  Constitutional: Negative for fever, chills and diaphoresis.  Respiratory: Negative for cough.   Cardiovascular: Negative for chest pain.  Gastrointestinal: Negative for abdominal pain.  Genitourinary: Positive for dysuria and hematuria.  Musculoskeletal: Positive for arthralgias (right hip). Negative for back pain and gait problem.  Skin: Negative for rash.  Neurological: Positive for numbness (right LE).      Objective:   Physical Exam Nursing note and vitals reviewed. Constitutional: She is oriented to person, place, and time. She appears well-developed and well-nourished. No distress.  HENT:  Head: Normocephalic and atraumatic.  Eyes: Conjunctivae and EOM are normal. Pupils are equal, round, and reactive to light.  Neck: Neck supple.  Cardiovascular: Normal rate, regular rhythm and normal heart sounds.   No murmur heard. Pulmonary/Chest: Effort normal and breath sounds normal. No respiratory distress. She exhibits no tenderness.  Abdominal: Soft. Bowel sounds are normal. There is no tenderness.  Musculoskeletal: Normal range of motion.  Neurological: She is alert and oriented to person, place, and time.  Skin: Skin is warm and dry.  Psychiatric: She has a normal mood and affect. Her behavior is normal.  Results for orders placed in visit on 06/10/14  POCT URINALYSIS DIPSTICK      Result Value Ref Range   Color, UA yellow     Clarity, UA cloudy     Glucose, UA neg     Bilirubin, UA small     Ketones, UA neg     Spec Grav, UA >=1.030     Blood, UA large     pH, UA 5.5     Protein, UA >=300     Urobilinogen, UA 1.0     Nitrite, UA neg     Leukocytes, UA Trace    POCT UA - MICROSCOPIC ONLY      Result Value Ref Range   WBC, Ur, HPF, POC tntc     RBC, urine, microscopic tntc     Bacteria, U Microscopic 3+     Mucus, UA pos     Epithelial cells, urine per micros 0-3     Crystals, Ur, HPF, POC neg     Casts, Ur, LPF,  POC neg     Yeast, UA neg     BP 130/72  Pulse 70  Temp(Src) 98.6 F (37 C) (Oral)  Resp 16  Ht 5' (1.524 m)  Wt 122 lb 9.6 oz (55.611  kg)  BMI 23.94 kg/m2  SpO2 100%     Assessment & Plan:  Urine is markedly abnormal. We'll treat with cephalexin 500 twice a day #20 repeat urine in 2 weeks I personally performed the services described in this documentation, which was scribed in my presence. The recorded information has been reviewed and is accurate. I suspect her hip pain is related to her back in that she has radicular symptoms which go all the way down to her foot

## 2014-06-10 NOTE — Patient Instructions (Signed)
Urinary Tract Infection  Urinary tract infections (UTIs) can develop anywhere along your urinary tract. Your urinary tract is your body's drainage system for removing wastes and extra water. Your urinary tract includes two kidneys, two ureters, a bladder, and a urethra. Your kidneys are a pair of bean-shaped organs. Each kidney is about the size of your fist. They are located below your ribs, one on each side of your spine.  CAUSES  Infections are caused by microbes, which are microscopic organisms, including fungi, viruses, and bacteria. These organisms are so small that they can only be seen through a microscope. Bacteria are the microbes that most commonly cause UTIs.  SYMPTOMS   Symptoms of UTIs may vary by age and gender of the patient and by the location of the infection. Symptoms in young women typically include a frequent and intense urge to urinate and a painful, burning feeling in the bladder or urethra during urination. Older women and men are more likely to be tired, shaky, and weak and have muscle aches and abdominal pain. A fever may mean the infection is in your kidneys. Other symptoms of a kidney infection include pain in your back or sides below the ribs, nausea, and vomiting.  DIAGNOSIS  To diagnose a UTI, your caregiver will ask you about your symptoms. Your caregiver also will ask to provide a urine sample. The urine sample will be tested for bacteria and white blood cells. White blood cells are made by your body to help fight infection.  TREATMENT   Typically, UTIs can be treated with medication. Because most UTIs are caused by a bacterial infection, they usually can be treated with the use of antibiotics. The choice of antibiotic and length of treatment depend on your symptoms and the type of bacteria causing your infection.  HOME CARE INSTRUCTIONS   If you were prescribed antibiotics, take them exactly as your caregiver instructs you. Finish the medication even if you feel better after you  have only taken some of the medication.   Drink enough water and fluids to keep your urine clear or pale yellow.   Avoid caffeine, tea, and carbonated beverages. They tend to irritate your bladder.   Empty your bladder often. Avoid holding urine for long periods of time.   Empty your bladder before and after sexual intercourse.   After a bowel movement, women should cleanse from front to back. Use each tissue only once.  SEEK MEDICAL CARE IF:    You have back pain.   You develop a fever.   Your symptoms do not begin to resolve within 3 days.  SEEK IMMEDIATE MEDICAL CARE IF:    You have severe back pain or lower abdominal pain.   You develop chills.   You have nausea or vomiting.   You have continued burning or discomfort with urination.  MAKE SURE YOU:    Understand these instructions.   Will watch your condition.   Will get help right away if you are not doing well or get worse.  Document Released: 09/21/2005 Document Revised: 06/12/2012 Document Reviewed: 01/20/2012  [employer] Patient Information 2014 [employer]

## 2014-06-13 LAB — URINE CULTURE: Colony Count: >=100000

## 2014-06-16 ENCOUNTER — Encounter: Payer: Medicare Other | Admitting: Gastroenterology

## 2014-06-17 DIAGNOSIS — IMO0002 Reserved for concepts with insufficient information to code with codable children: Secondary | ICD-10-CM | POA: Diagnosis not present

## 2014-06-17 DIAGNOSIS — M48061 Spinal stenosis, lumbar region without neurogenic claudication: Secondary | ICD-10-CM | POA: Diagnosis not present

## 2014-06-17 DIAGNOSIS — M5137 Other intervertebral disc degeneration, lumbosacral region: Secondary | ICD-10-CM | POA: Diagnosis not present

## 2014-06-19 ENCOUNTER — Telehealth: Payer: Self-pay | Admitting: *Deleted

## 2014-06-19 NOTE — Telephone Encounter (Signed)
Patient is wondering if she needs to come in and be seen for a refill of kidney medication.  [phone]

## 2014-06-19 NOTE — Telephone Encounter (Signed)
[facility] is requesting cardiac clearance for patient to undergo Lumbar decompression by Dr Tonita Cong.

## 2014-06-20 NOTE — Telephone Encounter (Signed)
Cleared for lumbar decompression. Can hold aspirin Plavix

## 2014-06-20 NOTE — Telephone Encounter (Signed)
Advised pt she does not need to come back in to have the urine rechecked. She has completed the antibiotic and is not symptomatic

## 2014-06-23 ENCOUNTER — Other Ambulatory Visit: Payer: Self-pay | Admitting: Emergency Medicine

## 2014-06-23 NOTE — Telephone Encounter (Signed)
Dr Gwenlyn Found reviewed the chart and signed form authorizing surgical clearance and to hold Plavix.  Form faxed to [facility]

## 2014-06-25 DIAGNOSIS — M545 Low back pain, unspecified: Secondary | ICD-10-CM | POA: Diagnosis not present

## 2014-07-09 DIAGNOSIS — M48061 Spinal stenosis, lumbar region without neurogenic claudication: Secondary | ICD-10-CM | POA: Diagnosis not present

## 2014-07-09 DIAGNOSIS — IMO0002 Reserved for concepts with insufficient information to code with codable children: Secondary | ICD-10-CM | POA: Diagnosis not present

## 2014-07-28 ENCOUNTER — Ambulatory Visit (INDEPENDENT_AMBULATORY_CARE_PROVIDER_SITE_OTHER): Payer: Medicare Other

## 2014-07-28 ENCOUNTER — Telehealth: Payer: Self-pay | Admitting: *Deleted

## 2014-07-28 ENCOUNTER — Ambulatory Visit (INDEPENDENT_AMBULATORY_CARE_PROVIDER_SITE_OTHER): Payer: Medicare Other | Admitting: Emergency Medicine

## 2014-07-28 VITALS — BP 140/60 | HR 71 | Temp 97.3°F | Resp 16 | Ht 61.0 in | Wt 118.6 lb

## 2014-07-28 DIAGNOSIS — R109 Unspecified abdominal pain: Secondary | ICD-10-CM

## 2014-07-28 DIAGNOSIS — N200 Calculus of kidney: Secondary | ICD-10-CM | POA: Diagnosis not present

## 2014-07-28 DIAGNOSIS — R197 Diarrhea, unspecified: Secondary | ICD-10-CM | POA: Diagnosis not present

## 2014-07-28 DIAGNOSIS — N390 Urinary tract infection, site not specified: Secondary | ICD-10-CM | POA: Diagnosis not present

## 2014-07-28 DIAGNOSIS — I6529 Occlusion and stenosis of unspecified carotid artery: Secondary | ICD-10-CM

## 2014-07-28 LAB — POCT UA - MICROSCOPIC ONLY
Casts, Ur, LPF, POC: NEGATIVE
Crystals, Ur, HPF, POC: NEGATIVE
Mucus, UA: NEGATIVE
Yeast, UA: NEGATIVE

## 2014-07-28 LAB — POCT CBC
Granulocyte percent: 73 %G (ref 37–80)
HCT, POC: 33.3 % — AB (ref 37.7–47.9)
Hemoglobin: 10.6 g/dL — AB (ref 12.2–16.2)
Lymph, poc: 0.9 (ref 0.6–3.4)
MCH, POC: 26.1 pg — AB (ref 27–31.2)
MCHC: 31.9 g/dL (ref 31.8–35.4)
MCV: 81.8 fL (ref 80–97)
MID (cbc): 0.6 (ref 0–0.9)
MPV: 9 fL (ref 0–99.8)
POC Granulocyte: 3.9 (ref 2–6.9)
POC LYMPH PERCENT: 16.4 %L (ref 10–50)
POC MID %: 10.6 %M (ref 0–12)
Platelet Count, POC: 272 10*3/uL (ref 142–424)
RBC: 4.07 M/uL (ref 4.04–5.48)
RDW, POC: 16.7 %
WBC: 5.3 10*3/uL (ref 4.6–10.2)

## 2014-07-28 LAB — POCT URINALYSIS DIPSTICK
Glucose, UA: NEGATIVE
Nitrite, UA: POSITIVE
Protein, UA: 300
Spec Grav, UA: 1.025
Urobilinogen, UA: 1
pH, UA: 5

## 2014-07-28 MED ORDER — CEPHALEXIN 500 MG PO CAPS: 500.0000 mg | ORAL_CAPSULE | Freq: Two times a day (BID) | ORAL | Status: DC

## 2014-07-28 NOTE — Progress Notes (Addendum)
Subjective:  This chart was scribed for [name], MD by Thea Alken, ED Scribe. This patient was seen in room 11 and the patient's care was started at 10:11 AM.  Patient ID: [name], female    DOB: Mar 21, 1935, 79 y.o.   MRN: [id number]  HPI Chief Complaint  Patient presents with  . Follow-up    on kidney infection; pt states she is still having pain upon start of urinating  . Nephrolithiasis    pt indicates she had a kidney stone that passed yesterday that followed with medium bleeding  . Diarrhea    x2 days; semi hard/loose stools;  pt indicates she believes she has/had an stomach virus  . Abdominal Pain    x2 days; lower stomach ache that was associated with the diarrhea    HPI Comments: BLAKELYNN SCHEELER is a 79 y.o. female with h/o right nephrectomy who presents to the [facility] c/o nephrolithiasis 1 day. Pt reports passing kidney stone 1 day ago. Pt denies seeing stones. She reports feeling the stone pass with bleeding afterward. At this time she dysuria described as stinging with urination.   Pt also complains of a stomach virus with associated nausea and diarrhea. She reports constant diarrhea yesterday but denies having diarrhea today.. Pt denies emesis and fever.  Past Surgical History  Procedure Laterality Date  . Nephrectomy      RIGHT  . Ureteral stent placement      LEFT  . Carotid endarterectomy      LEFT  . Pv angiogram  2004    Lt renal artery stent     Past Medical History  Diagnosis Date  . Diabetes mellitus     1990  . Essential hypertension, benign   . Hyperlipidemia   . History of anemia   . History of gout   . Claudication   . CVA (cerebral vascular accident)     LOC DIZZINESS 2008  . Azotemia   . Neck pain   . Renal cell cancer 2001  . Seizure   . PAD (peripheral artery disease)     hx. LCEA, hx Lt renal art. stenting, occl rt renal artery, occluded bil SFAs, moderatie iliac disease,    . Arthritis   .  Carotid artery disease    Past Surgical History  Procedure Laterality Date  . Nephrectomy      RIGHT  . Ureteral stent placement      LEFT  . Carotid endarterectomy      LEFT  . Pv angiogram  2004    Lt renal artery stent   Prior to Admission medications   Medication Sig Start Date End Date Taking? Authorizing Provider  acetaminophen (TYLENOL) 500 MG tablet Take 1,000 mg by mouth every 6 (six) hours as needed. For pain   Yes Historical Provider, MD  amLODipine (NORVASC) 10 MG tablet Take 1 tablet (10 mg total) by mouth daily. 03/27/14  Yes Darlyne Russian, MD  aspirin 81 MG tablet Take 81 mg by mouth daily.   Yes Historical Provider, MD  clopidogrel (PLAVIX) 75 MG tablet Take 1 tablet (75 mg total) by mouth daily. 08/14/13  Yes [name], MD  glipiZIDE (GLUCOTROL) 5 MG tablet Take 0.5 tablets (2.5 mg total) by mouth daily after breakfast. 08/14/13  Yes [name], MD  lisinopril (PRINIVIL,ZESTRIL) 20 MG tablet Take 1 tablet (20 mg total) by mouth daily. 08/14/13  Yes [name], MD  pravastatin (PRAVACHOL) 40 MG tablet  TAKE ONE TABLET BY MOUTH ONCE DAILY 06/23/14  Yes Heather M Marte, PA-C  zolpidem (AMBIEN) 10 MG tablet TAKE ONE TABLET BY MOUTH AT BEDTIME AS NEEDED FOR SLEEP   Yes [name], MD  Blood Glucose Monitoring Suppl (FREESTYLE LITE) DEVI Use Glucose meter to test blood sugar once daily in morning. Dx code 250.00 11/28/12   Collene Leyden, PA-C  cephALEXin (KEFLEX) 500 MG capsule Take 1 capsule (500 mg total) by mouth 2 (two) times daily. 06/10/14   Darlyne Russian, MD  glucose monitoring kit (FREESTYLE) monitoring kit Use as directed. Dx code 250.00. 11/29/12   Argentina Donovan, PA-C   Review of Systems  Constitutional: Negative for fever and chills.  Gastrointestinal: Positive for nausea and diarrhea. Negative for vomiting.  Genitourinary: Positive for dysuria.    Objective:   Physical Exam CONSTITUTIONAL: Well developed/well nourished, fragile has difficulty  getting on and off exam table.  HEAD: Normocephalic/atraumatic EYES: EOMI/PERRL ENMT: Mucous membranes moist NECK: supple no meningeal signs SPINE:entire spine nontender CV: S1/S2 noted, no murmurs/rubs/gallops noted LUNGS: Lungs are clear to auscultation bilaterally, no apparent distress ABDOMEN: soft, nontender in lower abdomen, no rebound or guarding GU:no cva tenderness NEURO: Pt is awake/alert, moves all extremitiesx4 EXTREMITIES: pulses normal, full ROM SKIN: warm, color normal PSYCH: no abnormalities of mood noted  Filed Vitals:   07/28/14 0945  BP: 140/60  Pulse: 71  Temp: 97.3 F (36.3 C)  TempSrc: Oral  Resp: 16  Height: '5\' 1"'  (1.549 m)  Weight: 118 lb 9.6 oz (53.797 kg)  SpO2: 100%   Results for orders placed in visit on 07/28/14  POCT URINALYSIS DIPSTICK      Result Value Ref Range   Color, UA yellow     Clarity, UA cloudy     Glucose, UA neg     Bilirubin, UA small     Ketones, UA trace     Spec Grav, UA 1.025     Blood, UA moderate     pH, UA 5.0     Protein, UA 300     Urobilinogen, UA 1.0     Nitrite, UA positive     Leukocytes, UA small (1+)    POCT UA - MICROSCOPIC ONLY      Result Value Ref Range   WBC, Ur, HPF, POC tntc     RBC, urine, microscopic tntc     Bacteria, U Microscopic 3+     Mucus, UA neg     Epithelial cells, urine per micros 0-4     Crystals, Ur, HPF, POC neg     Casts, Ur, LPF, POC neg     Yeast, UA neg    POCT CBC      Result Value Ref Range   WBC 5.3  4.6 - 10.2 K/uL   Lymph, poc 0.9  0.6 - 3.4   POC LYMPH PERCENT 16.4  10 - 50 %L   MID (cbc) 0.6  0 - 0.9   POC MID % 10.6  0 - 12 %M   POC Granulocyte 3.9  2 - 6.9   Granulocyte percent 73.0  37 - 80 %G   RBC 4.07  4.04 - 5.48 M/uL   Hemoglobin 10.6 (*) 12.2 - 16.2 g/dL   HCT, POC 33.3 (*) 37.7 - 47.9 %   MCV 81.8  80 - 97 fL   MCH, POC 26.1 (*) 27 - 31.2 pg   MCHC 31.9  31.8 - 35.4 g/dL   RDW,  POC 16.7     Platelet Count, POC 272  142 - 424 K/uL   MPV 9.0  0 -  99.8 fL  [facility] reading (PRIMARY) by  Dr.Yenifer Saccente there is what appears to be a 3 x 4 mm stone in the distal third of the ureter on the right. There are multiple clips from previous surgery. Meds ordered this encounter  Medications  . cephALEXin (KEFLEX) 500 MG capsule    Sig: Take 1 capsule (500 mg total) by mouth 2 (two) times daily.    Dispense:  20 capsule    Refill:  0     Assessment & Plan:  Patient has recurrent urinary tract infection. She also has what appears to be a stone on her KUB. Referral made back to Dr. Diona Fanti. Urine culture was done. Place the patient back on cephalexin 500 twice a day in that her infection was sensitive to that drug on her last visit  I personally performed the services described in this documentation, which was scribed in my presence. The recorded information has been reviewed and is accurate.

## 2014-07-28 NOTE — Telephone Encounter (Signed)
[facility] requesting clearance to hold plavix prior to injection

## 2014-07-30 ENCOUNTER — Telehealth: Payer: Self-pay

## 2014-07-30 DIAGNOSIS — R310 Gross hematuria: Secondary | ICD-10-CM | POA: Diagnosis not present

## 2014-07-30 DIAGNOSIS — N3000 Acute cystitis without hematuria: Secondary | ICD-10-CM | POA: Diagnosis not present

## 2014-07-30 LAB — URINE CULTURE: Colony Count: >=100000

## 2014-07-30 NOTE — Telephone Encounter (Signed)
Dr [name] asked me to call one of pt's children to talk with them about pt and her husband, pt Domingo Carp, needing a higher level of care than they have at home. Per Dr [name], with both pts having significant medical issues and problems w/dementia, when they come to [facility], they later don't remember discussions/instr's. Would they like [place] to refer [facility] or they might want to contact [facility] [employer] to coordinate search for proper level of care. LMOM on daughter's phone [phone] to CB (VM had generic message, don't know that it is her #, but other 2 #s I found in system have other names on VM). Son is not on either pt's 76, but is [facility] Power of Oneta Rack which is scanned in EPIC. See message under John's chart.  Daughter, [name], [place] and agreed to call [employer] and discuss w/her brother. She asked me to call him directly as well. See message under John's chart.

## 2014-07-31 ENCOUNTER — Other Ambulatory Visit: Payer: Self-pay | Admitting: Family Medicine

## 2014-07-31 ENCOUNTER — Telehealth: Payer: Self-pay | Admitting: *Deleted

## 2014-07-31 NOTE — Telephone Encounter (Signed)
[facility] faxed over a form requesting clearance for Gabrielle [name] to come off of Plavix x 5 days prior to an injection.  Dr Gwenlyn Found reviewed the chart and authorized her to hold Plavix.  Form faxed back to [place].

## 2014-08-01 NOTE — Telephone Encounter (Signed)
This is the wrong dose. Patient is on Ambien 5 mg one at bedtime please change and then I will sign

## 2014-08-01 NOTE — Telephone Encounter (Signed)
Re-pended correct dose w/note to pharmacist to take the incorrect 10 mg out of their system.

## 2014-08-01 NOTE — Addendum Note (Signed)
Addended by: Elwyn Reach A on: 08/01/2014 10:23 AM   Modules accepted: Orders

## 2014-08-02 MED ORDER — ZOLPIDEM TARTRATE 5 MG PO TABS: ORAL_TABLET | ORAL | Status: DC

## 2014-08-02 NOTE — Telephone Encounter (Signed)
Faxed

## 2014-08-12 DIAGNOSIS — M545 Low back pain, unspecified: Secondary | ICD-10-CM | POA: Diagnosis not present

## 2014-08-20 DIAGNOSIS — N2581 Secondary hyperparathyroidism of renal origin: Secondary | ICD-10-CM | POA: Diagnosis not present

## 2014-08-20 DIAGNOSIS — N039 Chronic nephritic syndrome with unspecified morphologic changes: Secondary | ICD-10-CM | POA: Diagnosis not present

## 2014-08-20 DIAGNOSIS — N183 Chronic kidney disease, stage 3 unspecified: Secondary | ICD-10-CM | POA: Diagnosis not present

## 2014-08-20 DIAGNOSIS — D631 Anemia in chronic kidney disease: Secondary | ICD-10-CM | POA: Diagnosis not present

## 2014-08-25 ENCOUNTER — Ambulatory Visit: Payer: Medicare Other | Admitting: Cardiology

## 2014-08-28 ENCOUNTER — Telehealth (HOSPITAL_COMMUNITY): Payer: Self-pay | Admitting: *Deleted

## 2014-09-05 DIAGNOSIS — M5137 Other intervertebral disc degeneration, lumbosacral region: Secondary | ICD-10-CM | POA: Diagnosis not present

## 2014-09-05 DIAGNOSIS — M545 Low back pain, unspecified: Secondary | ICD-10-CM | POA: Diagnosis not present

## 2014-09-05 DIAGNOSIS — M48061 Spinal stenosis, lumbar region without neurogenic claudication: Secondary | ICD-10-CM | POA: Diagnosis not present

## 2014-09-09 ENCOUNTER — Encounter: Payer: Self-pay | Admitting: *Deleted

## 2014-09-11 ENCOUNTER — Ambulatory Visit: Payer: Medicare Other | Admitting: Cardiology

## 2014-09-16 ENCOUNTER — Other Ambulatory Visit: Payer: Self-pay | Admitting: Family Medicine

## 2014-09-16 DIAGNOSIS — M48061 Spinal stenosis, lumbar region without neurogenic claudication: Secondary | ICD-10-CM | POA: Diagnosis not present

## 2014-09-16 DIAGNOSIS — M545 Low back pain, unspecified: Secondary | ICD-10-CM | POA: Diagnosis not present

## 2014-09-26 ENCOUNTER — Ambulatory Visit (INDEPENDENT_AMBULATORY_CARE_PROVIDER_SITE_OTHER): Payer: Medicare Other | Admitting: Emergency Medicine

## 2014-09-26 DIAGNOSIS — Z23 Encounter for immunization: Secondary | ICD-10-CM | POA: Diagnosis not present

## 2014-10-03 ENCOUNTER — Other Ambulatory Visit: Payer: Self-pay | Admitting: Family Medicine

## 2014-10-09 DIAGNOSIS — M5410 Radiculopathy, site unspecified: Secondary | ICD-10-CM | POA: Diagnosis not present

## 2014-10-09 DIAGNOSIS — M4806 Spinal stenosis, lumbar region: Secondary | ICD-10-CM | POA: Diagnosis not present

## 2014-10-09 DIAGNOSIS — M5136 Other intervertebral disc degeneration, lumbar region: Secondary | ICD-10-CM | POA: Diagnosis not present

## 2014-10-14 ENCOUNTER — Ambulatory Visit: Payer: Medicare Other | Admitting: Internal Medicine

## 2014-10-15 ENCOUNTER — Ambulatory Visit: Payer: Medicare Other | Admitting: Cardiology

## 2014-11-03 ENCOUNTER — Other Ambulatory Visit: Payer: Self-pay | Admitting: Emergency Medicine

## 2014-11-04 MED ORDER — ZOLPIDEM TARTRATE 5 MG PO TABS: ORAL_TABLET | ORAL | Status: DC

## 2014-11-04 NOTE — Addendum Note (Signed)
Addended by: Elwyn Reach A on: 11/04/2014 01:35 PM   Modules accepted: Orders

## 2014-11-04 NOTE — Telephone Encounter (Signed)
I called this in and then called back and cancelled it bc I realized Chelle had written it for a 90 day supply, and she can only write for 30 day. I have re-pended it for 30 days if someone else can review.

## 2014-11-06 NOTE — Telephone Encounter (Signed)
Faxed

## 2014-11-12 ENCOUNTER — Ambulatory Visit: Payer: Medicare Other | Admitting: Cardiology

## 2014-11-17 ENCOUNTER — Other Ambulatory Visit: Payer: Self-pay | Admitting: Emergency Medicine

## 2014-11-17 NOTE — Telephone Encounter (Signed)
Dr [name] this shouldn't be needed until 12/04/14.

## 2014-11-18 ENCOUNTER — Encounter: Payer: Self-pay | Admitting: Physician Assistant

## 2014-11-18 ENCOUNTER — Ambulatory Visit (INDEPENDENT_AMBULATORY_CARE_PROVIDER_SITE_OTHER): Payer: Medicare Other | Admitting: Physician Assistant

## 2014-11-18 ENCOUNTER — Telehealth: Payer: Self-pay | Admitting: Radiology

## 2014-11-18 VITALS — BP 110/50 | HR 82 | Ht 60.0 in | Wt 116.4 lb

## 2014-11-18 DIAGNOSIS — I739 Peripheral vascular disease, unspecified: Secondary | ICD-10-CM | POA: Diagnosis not present

## 2014-11-18 DIAGNOSIS — E785 Hyperlipidemia, unspecified: Secondary | ICD-10-CM | POA: Diagnosis not present

## 2014-11-18 DIAGNOSIS — I6529 Occlusion and stenosis of unspecified carotid artery: Secondary | ICD-10-CM

## 2014-11-18 DIAGNOSIS — I10 Essential (primary) hypertension: Secondary | ICD-10-CM | POA: Diagnosis not present

## 2014-11-18 NOTE — Assessment & Plan Note (Signed)
Blood pressure well controlled

## 2014-11-18 NOTE — Assessment & Plan Note (Signed)
No symptoms of claudication.  Slight changes in carotid Dopplers. Will need follow-up studies in 6 months

## 2014-11-18 NOTE — Telephone Encounter (Signed)
Called in.

## 2014-11-18 NOTE — Patient Instructions (Signed)
Your physician wants you to follow-up in: 6 MONTHS WITH DR BERRY You will receive a reminder letter in the mail two months in advance. If you don't receive a letter, please call our office to schedule the follow-up appointment.  

## 2014-11-18 NOTE — Assessment & Plan Note (Signed)
Followed by Dr. [name]

## 2014-11-18 NOTE — Telephone Encounter (Signed)
Have gotten request for Ambien/ but pharmacy has advised patient has supply to last until Feb 12th. Dr [name] wants to make sure patient in the future only gets 30 days at a time, if you have questions please let me know. (FYI)

## 2014-11-18 NOTE — Progress Notes (Signed)
Date:  11/18/2014   ID:  Gabrielle [name], DOB 11-Jul-1935, MRN [id number]  PCP:  [name], MD  Primary Cardiologist:  Gwenlyn Found    History of Present Illness: Gabrielle [name] is a 79 y.o. healthy weight, married Caucasian female, mother of 2, grandmother to 5 grandchildren who saw Dr. Gwenlyn Found 6 months ago. She has a history of PVOD with known carotid disease status post left carotid endarterectomy back in 2004. She has renovascular disease as well as lower occlusive disease status post left renal artery stenting by Dr. Gwenlyn Found back in 2004 with a known occluded right renal artery. She has bilaterally occluded SFAs, as well as moderate iliac disease left greater than right. She denies chest pain or shortness of breath. She does complain of mild claudication.. We have been following her Dopplers. Her last functional study performed July 2012 was nonischemic. She had lower extremity arterial Dopplers September 2014 with right ABI of 0.76 in the left 0.77. Increased velocities in the left SFA origin.   Dr. [name] follows her lipid profile.  Slight increase in right ICA velocities on most recent carotid Dopplers.  Patient presents today for six-month evaluation. She reports she's having hip and back problems and may require surgery with Dr. Prudy Feeler. She is currently taking hydrocodone for pain. She denies any dizziness or claudication. She also denies nausea, vomiting, fever, chest pain, shortness of breath, orthopnea,  PND, cough, congestion, abdominal pain, hematochezia, melena, lower extremity edema, .  Wt Readings from Last 3 Encounters:  11/18/14 116 lb 6.4 oz (52.799 kg)  07/28/14 118 lb 9.6 oz (53.797 kg)  06/10/14 122 lb 9.6 oz (55.611 kg)     Past Medical History  Diagnosis Date  . Diabetes mellitus     1990  . Essential hypertension, benign   . Hyperlipidemia   . History of anemia   . History of gout   . Claudication   . CVA (cerebral vascular accident)     LOC DIZZINESS 2008  .  Azotemia   . Neck pain   . Renal cell cancer 2001  . Seizure   . PAD (peripheral artery disease)     hx. LCEA, hx Lt renal art. stenting, occl rt renal artery, occluded bil SFAs, moderatie iliac disease,    . Arthritis   . Carotid artery disease     Current Outpatient Prescriptions  Medication Sig Dispense Refill  . acetaminophen (TYLENOL) 500 MG tablet Take 1,000 mg by mouth every 6 (six) hours as needed. For pain    . amLODipine (NORVASC) 10 MG tablet Take 1 tablet (10 mg total) by mouth daily. 90 tablet 3  . aspirin 81 MG tablet Take 81 mg by mouth daily.    . clopidogrel (PLAVIX) 75 MG tablet TAKE ONE TABLET BY MOUTH ONCE DAILY 90 tablet 0  . glipiZIDE (GLUCOTROL) 5 MG tablet TAKE ONE-HALF TABLET BY MOUTH ONCE DAILY AFTER  BREAKFAST 45 tablet 0  . lisinopril (PRINIVIL,ZESTRIL) 20 MG tablet TAKE ONE TABLET BY MOUTH ONCE DAILY 90 tablet 0  . pravastatin (PRAVACHOL) 40 MG tablet TAKE ONE TABLET BY MOUTH ONCE DAILY 30 tablet 5  . zolpidem (AMBIEN) 5 MG tablet TAKE ONE TABLET BY MOUTH AT BEDTIME AS NEEDED FOR SLEEP 90 tablet 0   No current facility-administered medications for this visit.    Allergies:    Allergies  Allergen Reactions  . Codeine Nausea And Vomiting  . Coreg [Carvedilol] Nausea And Vomiting  . Fentanyl   .  Hycodan [Hydrocodone-Homatropine] Itching  . Lacosamide      Other name is, VIMPAT  . Levetiracetam Other (See Comments)    Strange feelings in head  . Metformin And Related   . Metoprolol   . Morphine And Related Itching  . Tessalon [Benzonatate]   . Other Rash    BETA BLOCKER    Social History:  The patient  reports that she quit smoking about 8 years ago. She has never used smokeless tobacco. She reports that she does not drink alcohol or use illicit drugs.   Family history:   Family History  Problem Relation Age of Onset  . Cancer Mother   . Heart disease Father     ROS:  Please see the history of present illness.  All other systems reviewed  and negative.   PHYSICAL EXAM: VS:  BP 110/50 mmHg  Pulse 82  Ht 5' (1.524 m)  Wt 116 lb 6.4 oz (52.799 kg)  BMI 22.73 kg/m2 Well nourished, well developed, in no acute distress HEENT: Pupils are equal round react to light accommodation extraocular movements are intact.  Neck: no JVDNo cervical lymphadenopathy. Cardiac: Regular rate and rhythm without murmurs rubs or gallops. Lungs:  clear to auscultation bilaterally, no wheezing, rhonchi or rales Abd: soft, nontender, positive bowel sounds all quadrants, no hepatosplenomegaly Ext: no lower extremity edema.  2+ radial and dorsalis pedis pulses. Skin: warm and dry Neuro:  Grossly normal  EKG:  Normal sinus rhythm rate 82 bpm   ASSESSMENT AND PLAN:  Problem List Items Addressed This Visit    Essential hypertension, benign - Primary    Blood pressure well-controlled.    Relevant Orders      EKG 12-Lead   Hyperlipidemia    Followed by Dr. [name]    PAD (peripheral artery disease)    No symptoms of claudication.  Slight changes in carotid Dopplers. Will need follow-up studies in 6 months

## 2014-11-24 DIAGNOSIS — N2581 Secondary hyperparathyroidism of renal origin: Secondary | ICD-10-CM | POA: Diagnosis not present

## 2014-11-24 DIAGNOSIS — D631 Anemia in chronic kidney disease: Secondary | ICD-10-CM | POA: Diagnosis not present

## 2014-11-24 DIAGNOSIS — N183 Chronic kidney disease, stage 3 (moderate): Secondary | ICD-10-CM | POA: Diagnosis not present

## 2014-11-24 DIAGNOSIS — I129 Hypertensive chronic kidney disease with stage 1 through stage 4 chronic kidney disease, or unspecified chronic kidney disease: Secondary | ICD-10-CM | POA: Diagnosis not present

## 2014-12-22 ENCOUNTER — Other Ambulatory Visit: Payer: Self-pay | Admitting: Physician Assistant

## 2014-12-23 ENCOUNTER — Ambulatory Visit (INDEPENDENT_AMBULATORY_CARE_PROVIDER_SITE_OTHER): Payer: Medicare Other | Admitting: Emergency Medicine

## 2014-12-23 ENCOUNTER — Encounter (HOSPITAL_COMMUNITY): Payer: Self-pay | Admitting: Emergency Medicine

## 2014-12-23 ENCOUNTER — Emergency Department (HOSPITAL_COMMUNITY)
Admission: EM | Admit: 2014-12-23 | Discharge: 2014-12-23 | Disposition: A | Discharge status: Home / Self Care | Payer: Medicare Other | Attending: Emergency Medicine | Admitting: Emergency Medicine

## 2014-12-23 ENCOUNTER — Ambulatory Visit (INDEPENDENT_AMBULATORY_CARE_PROVIDER_SITE_OTHER): Payer: Medicare Other

## 2014-12-23 VITALS — BP 162/104 | HR 72 | Temp 97.4°F | Resp 17 | Ht 60.0 in | Wt 116.0 lb

## 2014-12-23 DIAGNOSIS — I6529 Occlusion and stenosis of unspecified carotid artery: Secondary | ICD-10-CM

## 2014-12-23 DIAGNOSIS — R531 Weakness: Secondary | ICD-10-CM | POA: Diagnosis not present

## 2014-12-23 DIAGNOSIS — I10 Essential (primary) hypertension: Secondary | ICD-10-CM

## 2014-12-23 DIAGNOSIS — K529 Noninfective gastroenteritis and colitis, unspecified: Secondary | ICD-10-CM | POA: Diagnosis not present

## 2014-12-23 DIAGNOSIS — I160 Hypertensive urgency: Secondary | ICD-10-CM

## 2014-12-23 DIAGNOSIS — Z862 Personal history of diseases of the blood and blood-forming organs and certain disorders involving the immune mechanism: Secondary | ICD-10-CM | POA: Insufficient documentation

## 2014-12-23 DIAGNOSIS — Z7982 Long term (current) use of aspirin: Secondary | ICD-10-CM | POA: Diagnosis not present

## 2014-12-23 DIAGNOSIS — R112 Nausea with vomiting, unspecified: Secondary | ICD-10-CM | POA: Diagnosis not present

## 2014-12-23 DIAGNOSIS — Z87891 Personal history of nicotine dependence: Secondary | ICD-10-CM | POA: Insufficient documentation

## 2014-12-23 DIAGNOSIS — Z8669 Personal history of other diseases of the nervous system and sense organs: Secondary | ICD-10-CM | POA: Insufficient documentation

## 2014-12-23 DIAGNOSIS — Z7902 Long term (current) use of antithrombotics/antiplatelets: Secondary | ICD-10-CM | POA: Diagnosis not present

## 2014-12-23 DIAGNOSIS — M1990 Unspecified osteoarthritis, unspecified site: Secondary | ICD-10-CM | POA: Diagnosis not present

## 2014-12-23 DIAGNOSIS — Z79899 Other long term (current) drug therapy: Secondary | ICD-10-CM | POA: Diagnosis not present

## 2014-12-23 DIAGNOSIS — Z85528 Personal history of other malignant neoplasm of kidney: Secondary | ICD-10-CM | POA: Insufficient documentation

## 2014-12-23 DIAGNOSIS — R1084 Generalized abdominal pain: Secondary | ICD-10-CM | POA: Diagnosis not present

## 2014-12-23 DIAGNOSIS — N390 Urinary tract infection, site not specified: Secondary | ICD-10-CM | POA: Insufficient documentation

## 2014-12-23 DIAGNOSIS — R1110 Vomiting, unspecified: Secondary | ICD-10-CM | POA: Diagnosis present

## 2014-12-23 DIAGNOSIS — E119 Type 2 diabetes mellitus without complications: Secondary | ICD-10-CM | POA: Insufficient documentation

## 2014-12-23 DIAGNOSIS — R748 Abnormal levels of other serum enzymes: Secondary | ICD-10-CM

## 2014-12-23 DIAGNOSIS — R404 Transient alteration of awareness: Secondary | ICD-10-CM | POA: Diagnosis not present

## 2014-12-23 LAB — URINALYSIS, ROUTINE W REFLEX MICROSCOPIC
Bilirubin Urine: NEGATIVE
Glucose, UA: NEGATIVE mg/dL
Ketones, ur: NEGATIVE mg/dL
Leukocytes, UA: NEGATIVE
Nitrite: NEGATIVE
Protein, ur: 100 mg/dL — AB
Specific Gravity, Urine: 1.015 (ref 1.005–1.030)
Urobilinogen, UA: 0.2 mg/dL (ref 0.0–1.0)
pH: 6.5 (ref 5.0–8.0)

## 2014-12-23 LAB — POCT UA - MICROSCOPIC ONLY
Bacteria, U Microscopic: NEGATIVE
Casts, Ur, LPF, POC: NEGATIVE
Crystals, Ur, HPF, POC: NEGATIVE
Mucus, UA: NEGATIVE
RBC, urine, microscopic: 0.2
Yeast, UA: NEGATIVE

## 2014-12-23 LAB — CBC WITH DIFFERENTIAL/PLATELET
Basophils Absolute: 0 10*3/uL (ref 0.0–0.1)
Basophils Relative: 1 % (ref 0–1)
Eosinophils Absolute: 0 10*3/uL (ref 0.0–0.7)
Eosinophils Relative: 1 % (ref 0–5)
HCT: 34 % — ABNORMAL LOW (ref 36.0–46.0)
Hemoglobin: 11.1 g/dL — ABNORMAL LOW (ref 12.0–15.0)
Lymphocytes Relative: 14 % (ref 12–46)
Lymphs Abs: 0.8 10*3/uL (ref 0.7–4.0)
MCH: 27.8 pg (ref 26.0–34.0)
MCHC: 32.6 g/dL (ref 30.0–36.0)
MCV: 85.2 fL (ref 78.0–100.0)
Monocytes Absolute: 0.5 10*3/uL (ref 0.1–1.0)
Monocytes Relative: 9 % (ref 3–12)
Neutro Abs: 4.4 10*3/uL (ref 1.7–7.7)
Neutrophils Relative %: 75 % (ref 43–77)
Platelets: 317 10*3/uL (ref 150–400)
RBC: 3.99 MIL/uL (ref 3.87–5.11)
RDW: 14.4 % (ref 11.5–15.5)
WBC: 5.8 10*3/uL (ref 4.0–10.5)

## 2014-12-23 LAB — POCT CBC
Granulocyte percent: 83.2 %G — AB (ref 37–80)
HCT, POC: 36.1 % — AB (ref 37.7–47.9)
Hemoglobin: 11.6 g/dL — AB (ref 12.2–16.2)
Lymph, poc: 0.8 (ref 0.6–3.4)
MCH, POC: 27.5 pg (ref 27–31.2)
MCHC: 32.2 g/dL (ref 31.8–35.4)
MCV: 85.4 fL (ref 80–97)
MID (cbc): 0.3 (ref 0–0.9)
MPV: 9.2 fL (ref 0–99.8)
POC Granulocyte: 5.3 (ref 2–6.9)
POC LYMPH PERCENT: 12.8 %L (ref 10–50)
POC MID %: 4 %M (ref 0–12)
Platelet Count, POC: 316 10*3/uL (ref 142–424)
RBC: 4.23 M/uL (ref 4.04–5.48)
RDW, POC: 15.9 %
WBC: 6.4 10*3/uL (ref 4.6–10.2)

## 2014-12-23 LAB — COMPREHENSIVE METABOLIC PANEL
ALT: 9 U/L (ref 0–35)
AST: 15 U/L (ref 0–37)
Albumin: 3.9 g/dL (ref 3.5–5.2)
Alkaline Phosphatase: 63 U/L (ref 39–117)
Anion gap: 6 (ref 5–15)
BUN: 18 mg/dL (ref 6–23)
CO2: 24 mmol/L (ref 19–32)
Calcium: 8.3 mg/dL — ABNORMAL LOW (ref 8.4–10.5)
Chloride: 107 mEq/L (ref 96–112)
Creatinine, Ser: 1.07 mg/dL (ref 0.50–1.10)
GFR calc Af Amer: 57 mL/min — ABNORMAL LOW (ref >90)
GFR calc non Af Amer: 49 mL/min — ABNORMAL LOW (ref >90)
Glucose, Bld: 99 mg/dL (ref 70–99)
Potassium: 3.3 mmol/L — ABNORMAL LOW (ref 3.5–5.1)
Sodium: 137 mmol/L (ref 135–145)
Total Bilirubin: 0.4 mg/dL (ref 0.3–1.2)
Total Protein: 6.2 g/dL (ref 6.0–8.3)

## 2014-12-23 LAB — POCT URINALYSIS DIPSTICK
Bilirubin, UA: NEGATIVE
Glucose, UA: NEGATIVE
Leukocytes, UA: NEGATIVE
Nitrite, UA: NEGATIVE
Protein, UA: >=300
Spec Grav, UA: >=1.03
Urobilinogen, UA: 0.2
pH, UA: 5.5

## 2014-12-23 LAB — URINE MICROSCOPIC-ADD ON

## 2014-12-23 LAB — LIPASE, BLOOD: Lipase: 164 U/L — ABNORMAL HIGH (ref 11–59)

## 2014-12-23 MED ORDER — ONDANSETRON HCL 4 MG/2ML IJ SOLN
4.0000 mg | Freq: Once | INTRAMUSCULAR | Status: AC
  Administered 2014-12-23: 4 mg via INTRAVENOUS
  Filled 2014-12-23: qty 2

## 2014-12-23 MED ORDER — NITROFURANTOIN MONOHYD MACRO 100 MG PO CAPS: 100.0000 mg | ORAL_CAPSULE | Freq: Two times a day (BID) | ORAL | Status: DC

## 2014-12-23 MED ORDER — SODIUM CHLORIDE 0.9 % IV SOLN
Freq: Once | INTRAVENOUS | Status: AC
  Administered 2014-12-23: 16:00:00 via INTRAVENOUS

## 2014-12-23 MED ORDER — NITROFURANTOIN MONOHYD MACRO 100 MG PO CAPS
100.0000 mg | ORAL_CAPSULE | Freq: Once | ORAL | Status: AC
Start: 2014-12-23 — End: 2014-12-23
  Administered 2014-12-23: 100 mg via ORAL
  Filled 2014-12-23: qty 1

## 2014-12-23 MED ORDER — ONDANSETRON HCL 4 MG/2ML IJ SOLN
2.0000 mg | Freq: Once | INTRAMUSCULAR | Status: AC
  Administered 2014-12-23: 2 mg via INTRAVENOUS

## 2014-12-23 NOTE — ED Notes (Signed)
Per ems pt was picked from [facility] . Pt was with husband. Per staff pt had near syncope and emesis x2. No fall, no loc. Pt co weakness. Hx HTN, nephrectomy and high cholesterol. Pt is alert and oriented.

## 2014-12-23 NOTE — Discharge Instructions (Signed)
Increase fluids. Prescription for antibiotic for your urine.  Lipase was elevated. This will need follow-up by her primary care doctor.  Return here if worse.

## 2014-12-23 NOTE — ED Notes (Signed)
Bed: WA01 Expected date:  Expected time:  Means of arrival:  Comments: Ems NVD

## 2014-12-23 NOTE — Progress Notes (Signed)
[facility] [address] [place] [address] [phone]  Date:  12/23/2014   Name:  Gabrielle [name]   DOB:  [birth year]   MRN:  [id number]  PCP:  [name], MD    Chief Complaint: Abdominal Pain and Dizziness   History of Present Illness:  Gabrielle [name] is a 79 y.o. very pleasant female patient who presents with the following:  Sudden onset of nausea and vomiting this morning while waiting for her husband at physical therapy. No stool change No chest pain, tightness or heaviness No fever or chills. Generalized abdominal discomfort. History of cholecystectomy No cough or shortness of breath Says no neuro symptoms.  Has malaise and myalgias Denies other complaint or health concern today.   Patient Active Problem List   Diagnosis Date Noted  . Spinal stenosis of lumbar region 05/21/2014  . Carotid artery disease 02/26/2014  . PAD (peripheral artery disease)   . Renal cell cancer   . Diabetes mellitus   . Essential hypertension, benign   . Hyperlipidemia   . History of anemia   . CVA (cerebral vascular accident)   . Seizure     Past Medical History  Diagnosis Date  . Diabetes mellitus     1990  . Essential hypertension, benign   . Hyperlipidemia   . History of anemia   . History of gout   . Claudication   . CVA (cerebral vascular accident)     LOC DIZZINESS 2008  . Azotemia   . Neck pain   . Renal cell cancer 2001  . Seizure   . PAD (peripheral artery disease)     hx. LCEA, hx Lt renal art. stenting, occl rt renal artery, occluded bil SFAs, moderatie iliac disease,    . Arthritis   . Carotid artery disease     Past Surgical History  Procedure Laterality Date  . Nephrectomy      RIGHT  . Ureteral stent placement      LEFT  . Carotid endarterectomy      LEFT  . Pv angiogram  2004    Lt renal artery stent  . Nm myocar perf wall motion  07/12/2011    normal    History  Substance Use Topics  . Smoking status: Former  Smoker    Quit date: 12/26/2005  . Smokeless tobacco: Never Used  . Alcohol Use: No    Family History  Problem Relation Age of Onset  . Cancer Mother   . Heart disease Father     Allergies  Allergen Reactions  . Codeine Nausea And Vomiting  . Coreg [Carvedilol] Nausea And Vomiting  . Fentanyl   . Hycodan [Hydrocodone-Homatropine] Itching  . Lacosamide      Other name is, VIMPAT  . Levetiracetam Other (See Comments)    Strange feelings in head  . Metformin And Related   . Metoprolol   . Morphine And Related Itching  . Tessalon [Benzonatate]   . Other Rash    BETA BLOCKER    Medication list has been reviewed and updated.  Current Outpatient Prescriptions on File Prior to Visit  Medication Sig Dispense Refill  . acetaminophen (TYLENOL) 500 MG tablet Take 1,000 mg by mouth every 6 (six) hours as needed. For pain    . amLODipine (NORVASC) 10 MG tablet Take 1 tablet (10 mg total) by mouth daily. 90 tablet 3  . aspirin 81 MG tablet Take 81 mg by mouth daily.    Marland Kitchen  clopidogrel (PLAVIX) 75 MG tablet Take 1 tablet (75 mg total) by mouth daily. PATIENT NEEDS OFFICE VISIT FOR ADDITIONAL REFILLS 30 tablet 0  . glipiZIDE (GLUCOTROL) 5 MG tablet Take 0.5 tablets (2.5 mg total) by mouth daily. PATIENT NEEDS OFFICE VISIT FOR ADDITIONAL REFILLS 15 tablet 0  . lisinopril (PRINIVIL,ZESTRIL) 20 MG tablet TAKE ONE TABLET BY MOUTH ONCE DAILY 90 tablet 0  . pravastatin (PRAVACHOL) 40 MG tablet TAKE ONE TABLET BY MOUTH ONCE DAILY 30 tablet 5  . zolpidem (AMBIEN) 5 MG tablet TAKE ONE TABLET BY MOUTH AT BEDTIME AS NEEDED FOR SLEEP 90 tablet 0   No current facility-administered medications on file prior to visit.    Review of Systems:  As per HPI, otherwise negative.    Physical Examination: Filed Vitals:   12/23/14 1152  BP: 162/104  Pulse: 72  Temp: 97.4 F (36.3 C)  Resp: 17   Filed Vitals:   12/23/14 1152  Height: 5' (1.524 m)  Weight: 116 lb (52.617 kg)   Body mass index is  22.65 kg/(m^2). Ideal Body Weight: Weight in (lb) to have BMI = 25: 127.7  GEN: WDWN, moderate distress, Non-toxic, A & O x 3  Dry appearing HEENT: Atraumatic, Normocephalic. Neck supple. No masses, No LAD. Ears and Nose: No external deformity. CV: RRR, No M/G/R. No JVD. No thrill. No extra heart sounds. PULM: CTA B, no wheezes, crackles, rhonchi. No retractions. No resp. distress. No accessory muscle use. ABD: S, epigastric tenderness, ND, +BS. No rebound. No HSM. EXTR: No c/c/e NEURO Normal gait.  PSYCH: Normally interactive. Conversant. Not depressed or anxious appearing.  Calm demeanor.    Assessment and Plan: Hypertensive urgency Abdominal pain and vomiting Mental status change  Discussed with Dr [name] who recommended referral to ER for evaluation as her pressure as late as last month was well controlled  EMS called   Signed,  Ellison Carwin, MD   Results for orders placed or performed in visit on 12/23/14  POCT urinalysis dipstick  Result Value Ref Range   Color, UA yellow    Clarity, UA amber    Glucose, UA neg    Bilirubin, UA neg    Ketones, UA trace    Spec Grav, UA >=1.030    Blood, UA trace-lysed    pH, UA 5.5    Protein, UA >=300    Urobilinogen, UA 0.2    Nitrite, UA neg    Leukocytes, UA Negative   POCT UA - Microscopic Only  Result Value Ref Range   WBC, Ur, HPF, POC 1-4    RBC, urine, microscopic 0.2    Bacteria, U Microscopic neg    Mucus, UA neg    Epithelial cells, urine per micros 1-3    Crystals, Ur, HPF, POC neg    Casts, Ur, LPF, POC neg    Yeast, UA neg    [facility] reading (PRIMARY) by  Dr. Ouida Sills no acute SBO or free air.  Marland Kitchen

## 2014-12-23 NOTE — ED Notes (Signed)
Pt wanting to d/c home.  Notified MD.  MD to speak to patient

## 2014-12-24 ENCOUNTER — Ambulatory Visit (INDEPENDENT_AMBULATORY_CARE_PROVIDER_SITE_OTHER): Payer: Medicare Other | Admitting: Emergency Medicine

## 2014-12-24 VITALS — BP 168/58 | HR 79 | Temp 98.1°F | Resp 20 | Ht 60.0 in | Wt 114.2 lb

## 2014-12-24 DIAGNOSIS — K858 Other acute pancreatitis without necrosis or infection: Secondary | ICD-10-CM

## 2014-12-24 DIAGNOSIS — I6529 Occlusion and stenosis of unspecified carotid artery: Secondary | ICD-10-CM | POA: Diagnosis not present

## 2014-12-24 LAB — COMPREHENSIVE METABOLIC PANEL
ALT: 9 U/L (ref 0–35)
AST: 27 U/L (ref 0–37)
Albumin: 4 g/dL (ref 3.5–5.2)
Alkaline Phosphatase: 64 U/L (ref 39–117)
BUN: 20 mg/dL (ref 6–23)
CO2: 26 mEq/L (ref 19–32)
Calcium: 9 mg/dL (ref 8.4–10.5)
Chloride: 99 mEq/L (ref 96–112)
Creat: 1.14 mg/dL — ABNORMAL HIGH (ref 0.50–1.10)
Glucose, Bld: 108 mg/dL — ABNORMAL HIGH (ref 70–99)
Potassium: 5.1 mEq/L (ref 3.5–5.3)
Sodium: 137 mEq/L (ref 135–145)
Total Bilirubin: 0.5 mg/dL (ref 0.2–1.2)
Total Protein: 6.8 g/dL (ref 6.0–8.3)

## 2014-12-24 NOTE — Progress Notes (Addendum)
Subjective:    Patient ID: Gabrielle [name], female    DOB: 12-26-1935, 79 y.o.   MRN: [id number]  This chart was scribed for [name], MD by Stephania Fragmin, ED Scribe. This patient was seen in room 9 and the patient's care was started at 9:16 AM.   Chief Complaint  Patient presents with  . Follow-up    went to [facility] yesterday     HPI   HPI Comments: Gabrielle [name] is a 79 y.o. female who presents to the [facility] for a follow-up visit for an illness yesterday. She was seen here at [facility] here yesterday, with a markedly elevated blood pressure, and sent to the hospital for evaluation. She had complaints of severe emesis--"I had never thrown up so much in my life; that is really unusual for me."-- and went to [facility] yesterday, where she was told she had elevated lipase levels. Patient denies EtOH consumption, medication changes. She also denies a history of cholecystectomy, although she does have a history of a right nephrectomy, due to kidney cancer. Patient has been taking Amlodipine and Lisinopril. She denies taking a potassium supplement. Dr. Diona Fanti follows her kidney. She doesn't remember her GI doctor's name.   Patient also complains of not being able to fall back asleep after waking up at 3 AM. She has been taking Ambien.  She states her son has visited more frequently. She also states that Medicare doesn't pay for nursing services.      Past Medical History  Diagnosis Date  . Diabetes mellitus     1990  . Essential hypertension, benign   . Hyperlipidemia   . History of anemia   . History of gout   . Claudication   . CVA (cerebral vascular accident)     LOC DIZZINESS 2008  . Azotemia   . Neck pain   . Renal cell cancer 2001  . Seizure   . PAD (peripheral artery disease)     hx. LCEA, hx Lt renal art. stenting, occl rt renal artery, occluded bil SFAs, moderatie iliac disease,    . Arthritis   . Carotid artery disease     Review  of Systems     Objective:   Physical Exam  CONSTITUTIONAL: Well developed/well nourished HEAD: Normocephalic/atraumatic EYES: EOMI/PERRL ENMT: Mucous membranes moist NECK: supple no meningeal signs SPINE/BACK:entire spine nontender CV: S1/S2 noted, no murmurs/rubs/gallops noted LUNGS: Lungs are clear to auscultation bilaterally, no apparent distress ABDOMEN: soft, nontender, no rebound or guarding, bowel sounds noted throughout abdomen GU:no cva tenderness NEURO: Pt is awake/alert/appropriate, moves all extremitiesx4.  No facial droop.   EXTREMITIES: pulses normal/equal, full ROM SKIN: warm, color normal PSYCH: no abnormalities of mood noted, alert and oriented to situation  Results for orders placed or performed during the hospital encounter of 12/23/14  Comprehensive metabolic panel  Result Value Ref Range   Sodium 137 135 - 145 mmol/L   Potassium 3.3 (L) 3.5 - 5.1 mmol/L   Chloride 107 96 - 112 mEq/L   CO2 24 19 - 32 mmol/L   Glucose, Bld 99 70 - 99 mg/dL   BUN 18 6 - 23 mg/dL   Creatinine, Ser 1.07 0.50 - 1.10 mg/dL   Calcium 8.3 (L) 8.4 - 10.5 mg/dL   Total Protein 6.2 6.0 - 8.3 g/dL   Albumin 3.9 3.5 - 5.2 g/dL   AST 15 0 - 37 U/L   ALT 9 0 - 35 U/L  Alkaline Phosphatase 63 39 - 117 U/L   Total Bilirubin 0.4 0.3 - 1.2 mg/dL   GFR calc non Af Amer 49 (L) >90 mL/min   GFR calc Af Amer 57 (L) >90 mL/min   Anion gap 6 5 - 15  CBC with Differential  Result Value Ref Range   WBC 5.8 4.0 - 10.5 K/uL   RBC 3.99 3.87 - 5.11 MIL/uL   Hemoglobin 11.1 (L) 12.0 - 15.0 g/dL   HCT 34.0 (L) 36.0 - 46.0 %   MCV 85.2 78.0 - 100.0 fL   MCH 27.8 26.0 - 34.0 pg   MCHC 32.6 30.0 - 36.0 g/dL   RDW 14.4 11.5 - 15.5 %   Platelets 317 150 - 400 K/uL   Neutrophils Relative % 75 43 - 77 %   Neutro Abs 4.4 1.7 - 7.7 K/uL   Lymphocytes Relative 14 12 - 46 %   Lymphs Abs 0.8 0.7 - 4.0 K/uL   Monocytes Relative 9 3 - 12 %   Monocytes Absolute 0.5 0.1 - 1.0 K/uL   Eosinophils Relative  1 0 - 5 %   Eosinophils Absolute 0.0 0.0 - 0.7 K/uL   Basophils Relative 1 0 - 1 %   Basophils Absolute 0.0 0.0 - 0.1 K/uL  Lipase, blood  Result Value Ref Range   Lipase 164 (H) 11 - 59 U/L  Urinalysis, Routine w reflex microscopic  Result Value Ref Range   Color, Urine YELLOW YELLOW   APPearance CLEAR CLEAR   Specific Gravity, Urine 1.015 1.005 - 1.030   pH 6.5 5.0 - 8.0   Glucose, UA NEGATIVE NEGATIVE mg/dL   Hgb urine dipstick TRACE (A) NEGATIVE   Bilirubin Urine NEGATIVE NEGATIVE   Ketones, ur NEGATIVE NEGATIVE mg/dL   Protein, ur 100 (A) NEGATIVE mg/dL   Urobilinogen, UA 0.2 0.0 - 1.0 mg/dL   Nitrite NEGATIVE NEGATIVE   Leukocytes, UA NEGATIVE NEGATIVE  Urine microscopic-add on  Result Value Ref Range   Squamous Epithelial / LPF RARE RARE   WBC, UA 7-10 <3 WBC/hpf   RBC / HPF 0-2 <3 RBC/hpf   Bacteria, UA RARE RARE   Casts HYALINE CASTS (A) NEGATIVE   Urine-Other MUCOUS PRESENT         Assessment & Plan:  Patient looks good today. Her lipase was elevated in the emergency room. Her potassium was slightly low. She will be on an increase potassium in her diet. Will schedule ultrasound of the gallbladder because of her elevated lipase. Follow-up after the ultrasound is done.. She is not on a medications that potentially would be the source. I personally performed the services described in this documentation, which was scribed in my presence. The recorded information has been reviewed and is accurate. Of note the patient did have surgery for renal cell cancer in the 90s

## 2014-12-24 NOTE — Patient Instructions (Signed)

## 2014-12-24 NOTE — ED Provider Notes (Signed)
CSN: [id number]     Arrival date & time 12/23/14  1418 History   First MD Initiated Contact with Patient 12/23/14 1509     Chief Complaint  Patient presents with  . Near Syncope     (Consider location/radiation/quality/duration/timing/severity/associated sxs/prior Treatment) HPI... Vomiting earlier today. Seen in urgent care center and sent to the emergency department. Patient denies syncopal event. No loss of consciousness, neuro deficits, chest pain, dyspnea, projectile vomiting, diarrhea, fever, chills, dysuria.  Severity is mild to moderate. Nothing makes symptoms better or worse. No abdominal pain  Past Medical History  Diagnosis Date  . Diabetes mellitus     1990  . Essential hypertension, benign   . Hyperlipidemia   . History of anemia   . History of gout   . Claudication   . CVA (cerebral vascular accident)     LOC DIZZINESS 2008  . Azotemia   . Neck pain   . Renal cell cancer 2001  . Seizure   . PAD (peripheral artery disease)     hx. LCEA, hx Lt renal art. stenting, occl rt renal artery, occluded bil SFAs, moderatie iliac disease,    . Arthritis   . Carotid artery disease    Past Surgical History  Procedure Laterality Date  . Nephrectomy      RIGHT  . Ureteral stent placement      LEFT  . Carotid endarterectomy      LEFT  . Pv angiogram  2004    Lt renal artery stent  . Nm myocar perf wall motion  07/12/2011    normal   Family History  Problem Relation Age of Onset  . Cancer Mother   . Heart disease Father    History  Substance Use Topics  . Smoking status: Former Smoker    Quit date: 12/26/2005  . Smokeless tobacco: Never Used  . Alcohol Use: No   OB History    No data available     Review of Systems  All other systems reviewed and are negative.     Allergies  Codeine; Coreg; Fentanyl; Hycodan; Lacosamide; Levetiracetam; Metformin and related; Metoprolol; Morphine and related; Tessalon; and Other  Home Medications   Prior to Admission  medications   Medication Sig Start Date End Date Taking? Authorizing Provider  acetaminophen (TYLENOL) 500 MG tablet Take 1,000 mg by mouth every 6 (six) hours as needed. For pain   Yes Historical Provider, MD  amLODipine (NORVASC) 10 MG tablet Take 1 tablet (10 mg total) by mouth daily. 03/27/14  Yes Darlyne Russian, MD  aspirin 81 MG tablet Take 81 mg by mouth daily.   Yes Historical Provider, MD  clopidogrel (PLAVIX) 75 MG tablet Take 1 tablet (75 mg total) by mouth daily. PATIENT NEEDS OFFICE VISIT FOR ADDITIONAL REFILLS 12/23/14  Yes Darlyne Russian, MD  glipiZIDE (GLUCOTROL) 5 MG tablet Take 0.5 tablets (2.5 mg total) by mouth daily. PATIENT NEEDS OFFICE VISIT FOR ADDITIONAL REFILLS 12/23/14  Yes Darlyne Russian, MD  HYDROcodone-acetaminophen (NORCO) 10-325 MG per tablet Take 1 tablet by mouth every 6 (six) hours as needed for severe pain.   Yes Historical Provider, MD  lisinopril (PRINIVIL,ZESTRIL) 20 MG tablet TAKE ONE TABLET BY MOUTH ONCE DAILY 10/04/14  Yes Mancel Bale, PA-C  pravastatin (PRAVACHOL) 40 MG tablet TAKE ONE TABLET BY MOUTH ONCE DAILY 06/23/14  Yes Heather M Marte, PA-C  zolpidem (AMBIEN) 5 MG tablet TAKE ONE TABLET BY MOUTH AT BEDTIME AS NEEDED FOR SLEEP 11/18/14  Yes Darlyne Russian, MD  nitrofurantoin, macrocrystal-monohydrate, (MACROBID) 100 MG capsule Take 1 capsule (100 mg total) by mouth 2 (two) times daily. 12/23/14   Nat Christen, MD   BP 170/65 mmHg  Pulse 108  Temp(Src) 97.6 F (36.4 C) (Oral)  Resp 20  SpO2 99% Physical Exam  Constitutional: She is oriented to person, place, and time.  Pale, alert, no acute distress  HENT:  Head: Normocephalic and atraumatic.  Eyes: Conjunctivae and EOM are normal. Pupils are equal, round, and reactive to light.  Neck: Normal range of motion. Neck supple.  Cardiovascular: Normal rate and regular rhythm.   Pulmonary/Chest: Effort normal and breath sounds normal.  Abdominal: Soft. Bowel sounds are normal.  Musculoskeletal: Normal  range of motion.  Neurological: She is alert and oriented to person, place, and time.  Skin: Skin is warm and dry.  Psychiatric: She has a normal mood and affect. Her behavior is normal.  Nursing note and vitals reviewed.   ED Course  Procedures (including critical care time) Labs Review Labs Reviewed  COMPREHENSIVE METABOLIC PANEL - Abnormal; Notable for the following:    Potassium 3.3 (*)    Calcium 8.3 (*)    GFR calc non Af Amer 49 (*)    GFR calc Af Amer 57 (*)    All other components within normal limits  CBC WITH DIFFERENTIAL - Abnormal; Notable for the following:    Hemoglobin 11.1 (*)    HCT 34.0 (*)    All other components within normal limits  LIPASE, BLOOD - Abnormal; Notable for the following:    Lipase 164 (*)    All other components within normal limits  URINALYSIS, ROUTINE W REFLEX MICROSCOPIC - Abnormal; Notable for the following:    Hgb urine dipstick TRACE (*)    Protein, ur 100 (*)    All other components within normal limits  URINE MICROSCOPIC-ADD ON - Abnormal; Notable for the following:    Casts HYALINE CASTS (*)    All other components within normal limits    Imaging Review Dg Abd Acute W/chest  12/23/2014   CLINICAL DATA:  Sudden onset of nausea and vomiting and abdominal discomfort this morning ; history of previous right nephrectomy; history of urinary tract stones.  EXAM: ACUTE ABDOMEN SERIES (ABDOMEN 2 VIEW & CHEST 1 VIEW)  COMPARISON:  Acute abdominal series of July 28, 2014.  FINDINGS: The lungs are mildly hyperinflated but clear. Subtle subcentimeter nodularity in the right mid and lower lung is present and stable since August of 2011. The heart and mediastinal structures are normal. There is calcification in the wall of the aortic arch. There is no pleural effusion or pneumothorax. The bony thorax exhibits no acute abnormalities.  The bowel gas pattern is within the limits of normal. There are vascular surgical clips to the right of the lumbar  spine. No abnormal calcifications project over the left kidney, course of the left ureter, or urinary bladder. There are phleboliths within the pelvis. There is dense calcification in the walls of the iliac arteries. The lumbar spine exhibits mild degenerative disc change.  IMPRESSION: 1. There is no active cardiopulmonary disease. 2. No acute intra-abdominal abnormality is demonstrated. No definite left-sided urinary tract stone is demonstrated.   Electronically Signed   By: David  Martinique   On: 12/23/2014 14:37     EKG Interpretation   Date/Time:  Tuesday December 23 2014 14:27:56 EST Ventricular Rate:  77 PR Interval:  136 QRS Duration: 75 QT Interval:  422 QTC Calculation: 478 R Axis:   55 Text Interpretation:  Sinus rhythm Confirmed by Madelena Maturin  MD, Lamae Fosco ([id number]) on  12/23/2014 6:37:12 PM      MDM   Final diagnoses:  Gastroenteritis  Elevated lipase    Patient is nontoxic. No acute abdomen. She feels better after IV fluids. I discussed her elevated lipase. She was determined to go home. She understands to get her lipase test followed up by her primary care doctor. Discharge medications Macrobid    Nat Christen, MD 12/24/14 [phone]

## 2015-01-02 ENCOUNTER — Ambulatory Visit
Admission: RE | Admit: 2015-01-02 | Discharge: 2015-01-02 | Disposition: A | Discharge status: Home / Self Care | Payer: Medicare Other | Source: Ambulatory Visit | Attending: Emergency Medicine | Admitting: Emergency Medicine

## 2015-01-02 DIAGNOSIS — K858 Other acute pancreatitis without necrosis or infection: Secondary | ICD-10-CM

## 2015-01-02 DIAGNOSIS — N281 Cyst of kidney, acquired: Secondary | ICD-10-CM | POA: Diagnosis not present

## 2015-01-19 ENCOUNTER — Other Ambulatory Visit: Payer: Self-pay | Admitting: Emergency Medicine

## 2015-01-19 ENCOUNTER — Other Ambulatory Visit: Payer: Self-pay | Admitting: Physician Assistant

## 2015-01-20 ENCOUNTER — Other Ambulatory Visit: Payer: Self-pay | Admitting: Emergency Medicine

## 2015-01-22 ENCOUNTER — Telehealth: Payer: Self-pay

## 2015-01-22 MED ORDER — PRAVASTATIN SODIUM 40 MG PO TABS: 40.0000 mg | ORAL_TABLET | Freq: Every day | ORAL | Status: DC

## 2015-01-22 NOTE — Telephone Encounter (Signed)
Sent in refill

## 2015-01-22 NOTE — Telephone Encounter (Signed)
Medication Refill  pravastatin (PRAVACHOL) 40 MG tablet [facility] [phone]    [phone] (M)

## 2015-01-30 ENCOUNTER — Telehealth (HOSPITAL_COMMUNITY): Payer: Self-pay | Admitting: *Deleted

## 2015-02-19 ENCOUNTER — Other Ambulatory Visit: Payer: Self-pay | Admitting: Physician Assistant

## 2015-02-19 ENCOUNTER — Other Ambulatory Visit: Payer: Self-pay | Admitting: Emergency Medicine

## 2015-02-19 NOTE — Telephone Encounter (Signed)
Dr [name], I am sending all of these for your review because we have been putting messages on last couple of RFs that pt needs OV. Do you want to see her before [place] or do you allow more time between f/up?

## 2015-02-20 NOTE — Telephone Encounter (Signed)
Called in.

## 2015-02-20 NOTE — Telephone Encounter (Signed)
Dr [name], do you want to give RFs on Plavix? I see that you have Rxd this in past, but see that pt sees cardiologist also and didn't know if they manage this?

## 2015-02-21 ENCOUNTER — Other Ambulatory Visit: Payer: Self-pay | Admitting: Physician Assistant

## 2015-02-25 ENCOUNTER — Telehealth: Payer: Self-pay | Admitting: Family Medicine

## 2015-02-25 NOTE — Telephone Encounter (Signed)
i had spoke with patient and advised her that Dr [name] would like for her and her husband get prevnar for pnuemonia

## 2015-03-18 ENCOUNTER — Other Ambulatory Visit: Payer: Self-pay | Admitting: Emergency Medicine

## 2015-03-26 NOTE — Telephone Encounter (Signed)
Closing this encounter problem has been addressed

## 2015-04-10 ENCOUNTER — Ambulatory Visit (INDEPENDENT_AMBULATORY_CARE_PROVIDER_SITE_OTHER): Payer: Medicare Other | Admitting: Emergency Medicine

## 2015-04-10 VITALS — BP 146/62 | HR 84 | Temp 98.2°F | Resp 18 | Ht 59.5 in | Wt 120.0 lb

## 2015-04-10 DIAGNOSIS — M545 Low back pain, unspecified: Secondary | ICD-10-CM

## 2015-04-10 DIAGNOSIS — N200 Calculus of kidney: Secondary | ICD-10-CM

## 2015-04-10 DIAGNOSIS — E119 Type 2 diabetes mellitus without complications: Secondary | ICD-10-CM | POA: Diagnosis not present

## 2015-04-10 DIAGNOSIS — E785 Hyperlipidemia, unspecified: Secondary | ICD-10-CM

## 2015-04-10 DIAGNOSIS — I10 Essential (primary) hypertension: Secondary | ICD-10-CM | POA: Diagnosis not present

## 2015-04-10 DIAGNOSIS — R350 Frequency of micturition: Secondary | ICD-10-CM | POA: Diagnosis not present

## 2015-04-10 LAB — POCT CBC
Granulocyte percent: 75.3 %G (ref 37–80)
HCT, POC: 35.5 % — AB (ref 37.7–47.9)
Hemoglobin: 11.2 g/dL — AB (ref 12.2–16.2)
Lymph, poc: 1.5 (ref 0.6–3.4)
MCH, POC: 27.2 pg (ref 27–31.2)
MCHC: 31.5 g/dL — AB (ref 31.8–35.4)
MCV: 86.5 fL (ref 80–97)
MID (cbc): 0.3 (ref 0–0.9)
MPV: 8.6 fL (ref 0–99.8)
POC Granulocyte: 5.5 (ref 2–6.9)
POC LYMPH PERCENT: 20.3 %L (ref 10–50)
POC MID %: 4.4 %M (ref 0–12)
Platelet Count, POC: 362 10*3/uL (ref 142–424)
RBC: 4.1 M/uL (ref 4.04–5.48)
RDW, POC: 17.9 %
WBC: 7.3 10*3/uL (ref 4.6–10.2)

## 2015-04-10 LAB — POCT URINALYSIS DIPSTICK
Bilirubin, UA: NEGATIVE
Blood, UA: NEGATIVE
Glucose, UA: NEGATIVE
Ketones, UA: NEGATIVE
Nitrite, UA: NEGATIVE
Protein, UA: 100
Spec Grav, UA: 1.015
Urobilinogen, UA: 0.2
pH, UA: 5.5

## 2015-04-10 LAB — GLUCOSE, POCT (MANUAL RESULT ENTRY): POC Glucose: 170 mg/dl — AB (ref 70–99)

## 2015-04-10 LAB — POCT GLYCOSYLATED HEMOGLOBIN (HGB A1C): Hemoglobin A1C: 5.8

## 2015-04-10 MED ORDER — PRAVASTATIN SODIUM 40 MG PO TABS: 40.0000 mg | ORAL_TABLET | Freq: Every day | ORAL | Status: DC

## 2015-04-10 MED ORDER — AMLODIPINE BESYLATE 10 MG PO TABS: 10.0000 mg | ORAL_TABLET | Freq: Every day | ORAL | Status: DC

## 2015-04-10 MED ORDER — ZOLPIDEM TARTRATE 5 MG PO TABS: 5.0000 mg | ORAL_TABLET | Freq: Every evening | ORAL | Status: DC | PRN

## 2015-04-10 MED ORDER — CLOPIDOGREL BISULFATE 75 MG PO TABS: 75.0000 mg | ORAL_TABLET | Freq: Every day | ORAL | Status: DC

## 2015-04-10 MED ORDER — LISINOPRIL 20 MG PO TABS: 20.0000 mg | ORAL_TABLET | Freq: Every day | ORAL | Status: DC

## 2015-04-10 NOTE — Patient Instructions (Signed)
Insomnia Insomnia is frequent trouble falling and/or staying asleep. Insomnia can be a long term problem or a short term problem. Both are common. Insomnia can be a short term problem when the wakefulness is related to a certain stress or worry. Long term insomnia is often related to ongoing stress during waking hours and/or poor sleeping habits. Overtime, sleep deprivation itself can make the problem worse. Every little thing feels more severe because you are overtired and your ability to cope is decreased. CAUSES   Stress, anxiety, and depression.  Poor sleeping habits.  Distractions such as TV in the bedroom.  Naps close to bedtime.  Engaging in emotionally charged conversations before bed.  Technical reading before sleep.  Alcohol and other sedatives. They may make the problem worse. They can hurt normal sleep patterns and normal dream activity.  Stimulants such as caffeine for several hours prior to bedtime.  Pain syndromes and shortness of breath can cause insomnia.  Exercise late at night.  Changing time zones may cause sleeping problems (jet lag). It is sometimes helpful to have someone observe your sleeping patterns. They should look for periods of not breathing during the night (sleep apnea). They should also look to see how long those periods last. If you live alone or observers are uncertain, you can also be observed at a sleep clinic where your sleep patterns will be professionally monitored. Sleep apnea requires a checkup and treatment. Give your caregivers your medical history. Give your caregivers observations your family has made about your sleep.  SYMPTOMS   Not feeling rested in the morning.  Anxiety and restlessness at bedtime.  Difficulty falling and staying asleep. TREATMENT   Your caregiver may prescribe treatment for an underlying medical disorders. Your caregiver can give advice or help if you are using alcohol or other drugs for self-medication. Treatment  of underlying problems will usually eliminate insomnia problems.  Medications can be prescribed for short time use. They are generally not recommended for lengthy use.  Over-the-counter sleep medicines are not recommended for lengthy use. They can be habit forming.  You can promote easier sleeping by making lifestyle changes such as:  Using relaxation techniques that help with breathing and reduce muscle tension.  Exercising earlier in the day.  Changing your diet and the time of your last meal. No night time snacks.  Establish a regular time to go to bed.  Counseling can help with stressful problems and worry.  Soothing music and white noise may be helpful if there are background noises you cannot remove.  Stop tedious detailed work at least one hour before bedtime. HOME CARE INSTRUCTIONS   Keep a diary. Inform your caregiver about your progress. This includes any medication side effects. See your caregiver regularly. Take note of:  Times when you are asleep.  Times when you are awake during the night.  The quality of your sleep.  How you feel the next day. This information will help your caregiver care for you.  Get out of bed if you are still awake after 15 minutes. Read or do some quiet activity. Keep the lights down. Wait until you feel sleepy and go back to bed.  Keep regular sleeping and waking hours. Avoid naps.  Exercise regularly.  Avoid distractions at bedtime. Distractions include watching television or engaging in any intense or detailed activity like attempting to balance the household checkbook.  Develop a bedtime ritual. Keep a familiar routine of bathing, brushing your teeth, climbing into bed at the same   time each night, listening to soothing music. Routines increase the success of falling to sleep faster.  Use relaxation techniques. This can be using breathing and muscle tension release routines. It can also include visualizing peaceful scenes. You can  also help control troubling or intruding thoughts by keeping your mind occupied with boring or repetitive thoughts like the old concept of counting sheep. You can make it more creative like imagining planting one beautiful flower after another in your backyard garden.  During your day, work to eliminate stress. When this is not possible use some of the previous suggestions to help reduce the anxiety that accompanies stressful situations. MAKE SURE YOU:   Understand these instructions.  Will watch your condition.  Will get help right away if you are not doing well or get worse. Document Released: 12/09/2000 Document Revised: 03/05/2012 Document Reviewed: 01/09/2008 Exit[employer] Patient Information 2015 [employer]. This information is not intended to replace advice given to you by your health care provider. Make sure you discuss any questions you have with your health care provider.  

## 2015-04-10 NOTE — Progress Notes (Addendum)
Subjective:  This chart was scribed for Gabrielle Russian, MD by [name], ED Scribe. The patient was seen in room 5. Patient's care was started at 12:35 PM.    Patient ID: Gabrielle [name], female    DOB: 06/13/1935, 80 y.o.   MRN: [id number]  Chief Complaint  Patient presents with  . Medication Refill   HPI HPI Comments: Gabrielle [name] is a 80 y.o. female, with a h/o CVA, PAD, CAD, DM, HTN, hyperlipidemia, seizure, renal cell CA, anemia, who presents to the [facility] for a medication refill.   Back Pain Pt reports persistent back pain that is exacerbated with movement. She plans to have back surgery when she can get assistance with her husband.   Sleep Disturbances Pt reports that she is still experiencing difficulty sleeping. Pt reports associated fatigue at this time. She states that 10 mg Ambien helped more than the 5 mg dose that she is currently taking.   Urinary Frequency  Pt reports recent urinary frequency. She states that she has to get up x2-4 nightly to use the restroom. Pt has an upcoming appointment with her urologist next month.   Past Medical History  Diagnosis Date  . Diabetes mellitus     1990  . Essential hypertension, benign   . Hyperlipidemia   . History of anemia   . History of gout   . Claudication   . CVA (cerebral vascular accident)     LOC DIZZINESS 2008  . Azotemia   . Neck pain   . Renal cell cancer 2001  . Seizure   . PAD (peripheral artery disease)     hx. LCEA, hx Lt renal art. stenting, occl rt renal artery, occluded bil SFAs, moderatie iliac disease,    . Arthritis   . Carotid artery disease    Current Outpatient Prescriptions on File Prior to Visit  Medication Sig Dispense Refill  . acetaminophen (TYLENOL) 500 MG tablet Take 1,000 mg by mouth every 6 (six) hours as needed. For pain    . amLODipine (NORVASC) 10 MG tablet TAKE ONE TABLET BY MOUTH ONCE DAILY 90 tablet 0  . aspirin 81 MG tablet Take 81 mg by mouth  daily.    . clopidogrel (PLAVIX) 75 MG tablet TAKE ONE TABLET BY MOUTH ONCE DAILY **NEED  OFFICE  VISIT  FOR  FURTHER  REFILLS** 90 tablet 0  . glipiZIDE (GLUCOTROL) 5 MG tablet TAKE ONE-HALF TABLET BY MOUTH ONCE DAILY 15 tablet 0  . HYDROcodone-acetaminophen (NORCO) 10-325 MG per tablet Take 1 tablet by mouth every 6 (six) hours as needed for severe pain.    Marland Kitchen lisinopril (PRINIVIL,ZESTRIL) 20 MG tablet TAKE ONE TABLET BY MOUTH ONCE DAILY 30 tablet 2  . nitrofurantoin, macrocrystal-monohydrate, (MACROBID) 100 MG capsule Take 1 capsule (100 mg total) by mouth 2 (two) times daily. 10 capsule 0  . pravastatin (PRAVACHOL) 40 MG tablet Take 1 tablet (40 mg total) by mouth daily. 30 tablet 5  . zolpidem (AMBIEN) 5 MG tablet TAKE ONE TABLET BY MOUTH AT BEDTIME AS NEEDED FOR SLEEP 90 tablet 0   No current facility-administered medications on file prior to visit.   Allergies  Allergen Reactions  . Codeine Nausea And Vomiting  . Coreg [Carvedilol] Nausea And Vomiting  . Fentanyl   . Hycodan [Hydrocodone-Homatropine] Itching  . Lacosamide      Other name is, VIMPAT  . Levetiracetam Other (See Comments)    Strange feelings in head  .  Metformin And Related   . Metoprolol   . Morphine And Related Itching  . Tessalon [Benzonatate]   . Other Rash    BETA BLOCKER    Review of Systems  Constitutional: Positive for fatigue.  Genitourinary: Positive for frequency.  Musculoskeletal: Positive for back pain.  Psychiatric/Behavioral: Positive for sleep disturbance.   BP 146/62 mmHg  Pulse 84  Temp(Src) 98.2 F (36.8 C) (Oral)  Resp 18  Ht 4' 11.5" (1.511 m)  Wt 120 lb (54.432 kg)  BMI 23.84 kg/m2  SpO2 100%    Objective:   Physical Exam CONSTITUTIONAL: Well developed/well nourished, pt walks at a 70 degree angle due to back discomfort  HEAD: Normocephalic/atraumatic EYES: EOMI/PERRL ENMT: Mucous membranes moist NECK: supple no meningeal signs SPINE/BACK:entire spine nontender CV: S1/S2  noted, no murmurs/rubs/gallops noted LUNGS: Lungs are clear to auscultation bilaterally, no apparent distress ABDOMEN: soft, nontender, no rebound or guarding, bowel sounds noted throughout abdomen GU:no cva tenderness NEURO: Pt is awake/alert/appropriate, moves all extremitiesx4. No facial droop.   EXTREMITIES: pulses normal/equal, full ROM SKIN: warm, color normal PSYCH: no abnormalities of mood noted, alert and oriented to situation    Meds ordered this encounter  Medications  . zolpidem (AMBIEN) 5 MG tablet    Sig: Take 1 tablet (5 mg total) by mouth at bedtime as needed. for sleep    Dispense:  90 tablet    Refill:  1  . pravastatin (PRAVACHOL) 40 MG tablet    Sig: Take 1 tablet (40 mg total) by mouth daily.    Dispense:  30 tablet    Refill:  5  . lisinopril (PRINIVIL,ZESTRIL) 20 MG tablet    Sig: Take 1 tablet (20 mg total) by mouth daily.    Dispense:  30 tablet    Refill:  11  . clopidogrel (PLAVIX) 75 MG tablet    Sig: Take 1 tablet (75 mg total) by mouth daily.    Dispense:  90 tablet    Refill:  3  . amLODipine (NORVASC) 10 MG tablet    Sig: Take 1 tablet (10 mg total) by mouth daily.    Dispense:  90 tablet    Refill:  3    Results for orders placed or performed in visit on 04/10/15  POCT CBC  Result Value Ref Range   WBC 7.3 4.6 - 10.2 K/uL   Lymph, poc 1.5 0.6 - 3.4   POC LYMPH PERCENT 20.3 10 - 50 %L   MID (cbc) 0.3 0 - 0.9   POC MID % 4.4 0 - 12 %M   POC Granulocyte 5.5 2 - 6.9   Granulocyte percent 75.3 37 - 80 %G   RBC 4.10 4.04 - 5.48 M/uL   Hemoglobin 11.2 (A) 12.2 - 16.2 g/dL   HCT, POC 35.5 (A) 37.7 - 47.9 %   MCV 86.5 80 - 97 fL   MCH, POC 27.2 27 - 31.2 pg   MCHC 31.5 (A) 31.8 - 35.4 g/dL   RDW, POC 17.9 %   Platelet Count, POC 362 142 - 424 K/uL   MPV 8.6 0 - 99.8 fL  POCT glucose (manual entry)  Result Value Ref Range   POC Glucose 170 (A) 70 - 99 mg/dl  POCT glycosylated hemoglobin (Hb A1C)  Result Value Ref Range   Hemoglobin A1C  5.8   POCT urinalysis dipstick  Result Value Ref Range   Color, UA yellow    Clarity, UA clear    Glucose, UA neg  Bilirubin, UA neg    Ketones, UA neg    Spec Grav, UA 1.015    Blood, UA neg    pH, UA 5.5    Protein, UA 100    Urobilinogen, UA 0.2    Nitrite, UA neg    Leukocytes, UA Trace    Assessment & Plan:   Her A1c was 5.8 but her random sugar was 170. Her hemoglobin is slightly low at 11.2 but otherwise blood work looks good. Her medications were refilled. She was given Ambien 5 mg to take at bedtime.I personally performed the services described in this documentation, which was scribed in my presence. The recorded information has been reviewed and is accurate.

## 2015-04-28 DIAGNOSIS — N2581 Secondary hyperparathyroidism of renal origin: Secondary | ICD-10-CM | POA: Diagnosis not present

## 2015-04-28 DIAGNOSIS — N189 Chronic kidney disease, unspecified: Secondary | ICD-10-CM | POA: Diagnosis not present

## 2015-04-28 DIAGNOSIS — N183 Chronic kidney disease, stage 3 (moderate): Secondary | ICD-10-CM | POA: Diagnosis not present

## 2015-04-29 ENCOUNTER — Other Ambulatory Visit: Payer: Self-pay | Admitting: Emergency Medicine

## 2015-05-06 DIAGNOSIS — I129 Hypertensive chronic kidney disease with stage 1 through stage 4 chronic kidney disease, or unspecified chronic kidney disease: Secondary | ICD-10-CM | POA: Diagnosis not present

## 2015-05-06 DIAGNOSIS — N2581 Secondary hyperparathyroidism of renal origin: Secondary | ICD-10-CM | POA: Diagnosis not present

## 2015-05-06 DIAGNOSIS — N183 Chronic kidney disease, stage 3 (moderate): Secondary | ICD-10-CM | POA: Diagnosis not present

## 2015-05-06 DIAGNOSIS — D631 Anemia in chronic kidney disease: Secondary | ICD-10-CM | POA: Diagnosis not present

## 2015-08-10 ENCOUNTER — Other Ambulatory Visit: Payer: Self-pay | Admitting: Emergency Medicine

## 2015-08-11 ENCOUNTER — Ambulatory Visit (INDEPENDENT_AMBULATORY_CARE_PROVIDER_SITE_OTHER): Payer: Medicare Other

## 2015-08-11 DIAGNOSIS — Z23 Encounter for immunization: Secondary | ICD-10-CM

## 2015-08-11 NOTE — Progress Notes (Signed)
   Subjective:    Patient ID: Gabrielle [name], female    DOB: April 09, 1935, 80 y.o.   MRN: [id number]  HPI  Pt is here for Prevnar 13 vaccine only.    Review of Systems     Objective:   Physical Exam        Assessment & Plan:

## 2015-08-20 ENCOUNTER — Telehealth: Payer: Self-pay | Admitting: *Deleted

## 2015-08-20 NOTE — Telephone Encounter (Signed)
Requesting surgical clearance:   1. Type of surgery: back injection  2. Surgeon: unknown  3. Surgical date: 09/16/15  4. Medications that need to be held: Plavix for 5 days  5. CAD: No     6. I will defer to: Dr Quay Burow

## 2015-08-22 NOTE — Telephone Encounter (Signed)
OK to interrupt Plavix for back inj

## 2015-08-24 NOTE — Telephone Encounter (Signed)
Encounter routed to [facility]. [id number] fax to [facility]

## 2015-09-01 ENCOUNTER — Ambulatory Visit: Payer: Medicare Other | Admitting: Cardiovascular Disease

## 2015-09-05 DIAGNOSIS — Z23 Encounter for immunization: Secondary | ICD-10-CM | POA: Diagnosis not present

## 2015-09-16 DIAGNOSIS — M5416 Radiculopathy, lumbar region: Secondary | ICD-10-CM | POA: Diagnosis not present

## 2015-09-16 DIAGNOSIS — M4806 Spinal stenosis, lumbar region: Secondary | ICD-10-CM | POA: Diagnosis not present

## 2015-09-17 ENCOUNTER — Ambulatory Visit (INDEPENDENT_AMBULATORY_CARE_PROVIDER_SITE_OTHER): Payer: Medicare Other | Admitting: Family Medicine

## 2015-09-17 VITALS — BP 150/86 | HR 79 | Temp 98.3°F | Resp 18 | Ht 59.5 in | Wt 115.0 lb

## 2015-09-17 DIAGNOSIS — G4700 Insomnia, unspecified: Secondary | ICD-10-CM

## 2015-09-17 DIAGNOSIS — F418 Other specified anxiety disorders: Secondary | ICD-10-CM | POA: Diagnosis not present

## 2015-09-17 DIAGNOSIS — H269 Unspecified cataract: Secondary | ICD-10-CM | POA: Diagnosis not present

## 2015-09-17 DIAGNOSIS — S025XXB Fracture of tooth (traumatic), initial encounter for open fracture: Secondary | ICD-10-CM

## 2015-09-17 DIAGNOSIS — M4806 Spinal stenosis, lumbar region: Secondary | ICD-10-CM

## 2015-09-17 DIAGNOSIS — R350 Frequency of micturition: Secondary | ICD-10-CM

## 2015-09-17 DIAGNOSIS — M48061 Spinal stenosis, lumbar region without neurogenic claudication: Secondary | ICD-10-CM

## 2015-09-17 DIAGNOSIS — I10 Essential (primary) hypertension: Secondary | ICD-10-CM

## 2015-09-17 LAB — POCT URINALYSIS DIP (MANUAL ENTRY)
Bilirubin, UA: NEGATIVE
Glucose, UA: NEGATIVE
Leukocytes, UA: NEGATIVE
Nitrite, UA: NEGATIVE
Protein Ur, POC: >=300 — AB
Spec Grav, UA: >=1.03
Urobilinogen, UA: 0.2
pH, UA: 5

## 2015-09-17 LAB — POC MICROSCOPIC URINALYSIS (UMFC): Mucus: ABSENT

## 2015-09-17 LAB — COMPLETE METABOLIC PANEL WITH GFR
ALT: 10 U/L (ref 6–29)
AST: 14 U/L (ref 10–35)
Albumin: 4.2 g/dL (ref 3.6–5.1)
Alkaline Phosphatase: 57 U/L (ref 33–130)
BUN: 22 mg/dL (ref 7–25)
CO2: 27 mmol/L (ref 20–31)
Calcium: 9.3 mg/dL (ref 8.6–10.4)
Chloride: 101 mmol/L (ref 98–110)
Creat: 1.2 mg/dL — ABNORMAL HIGH (ref 0.60–0.93)
GFR, Est African American: 50 mL/min — ABNORMAL LOW (ref >=60)
GFR, Est Non African American: 44 mL/min — ABNORMAL LOW (ref >=60)
Glucose, Bld: 155 mg/dL — ABNORMAL HIGH (ref 65–99)
Potassium: 4.4 mmol/L (ref 3.5–5.3)
Sodium: 143 mmol/L (ref 135–146)
Total Bilirubin: 0.4 mg/dL (ref 0.2–1.2)
Total Protein: 6.6 g/dL (ref 6.1–8.1)

## 2015-09-17 MED ORDER — MIRTAZAPINE 7.5 MG PO TABS: 7.5000 mg | ORAL_TABLET | Freq: Every day | ORAL | Status: DC

## 2015-09-17 MED ORDER — LISINOPRIL 40 MG PO TABS: 40.0000 mg | ORAL_TABLET | Freq: Every day | ORAL | Status: DC

## 2015-09-17 NOTE — Patient Instructions (Addendum)
Try calling your homeowners insurance regarding [employer] hearing aids.  I'm referring you to an ophthalmologist because of the cataracts.  I'm glad your back is feeling better after the injections.  I am prescribing Remeron for sleep. Please take it one hour before you plan to go to bed. This should help you fall asleep and stay asleep without overmedication.  We are increasing your lisinopril because of the elevated blood pressures. Please continue to monitor your blood pressure and let us know if it stays high.

## 2015-09-17 NOTE — Progress Notes (Addendum)
This chart was scribed for [name], MD by [name], medical scribe at [facility].The patient was seen in exam room 12 and the patient's care was started at 11:23 AM.  Patient ID: Gabrielle [name] MRN: [id number], DOB: 08-Feb-1935, 80 y.o. Date of Encounter: 09/17/2015  Primary Physician: [name], MD  Chief Complaint:  Chief Complaint  Patient presents with  . Hypertension  . Urinary Frequency    Onset 2-3 weeks  . Loss of Vision    Close up, can still see to drive/ onset 2-3 weeks    HPI:  Gabrielle [name] is a 80 y.o. female who presents to [facility] complaining of fatigue.  She had spinal injection yesterday for chronic back pain from spinal stenosis. She must've been too nervous, and her BP was at 188. And then she checked again after she got home measuring at 186. She took another BP. She also has increased urinary frequency that started about 2-3 weeks ago. She's been having a hard time sleeping at night.  She reports having some loss of vision and can't see close up for the past 2-3 weeks.   She has already received a flu shot.   Her husband is currently in a nursing home. She's having a rough time with the nursing home. A nursing lady brought him to take a shower, and later found his hearing aid broken in the laundry. Medicare or the nursing home won't pay for it.  Her son recently passed away.   Past Medical History  Diagnosis Date  . Diabetes mellitus     1990  . Essential hypertension, benign   . Hyperlipidemia   . History of anemia   . History of gout   . Claudication   . CVA (cerebral vascular accident)     LOC DIZZINESS 2008  . Azotemia   . Neck pain   . Renal cell cancer 2001  . Seizure   . PAD (peripheral artery disease)     hx. LCEA, hx Lt renal art. stenting, occl rt renal artery, occluded bil SFAs, moderatie iliac disease,    . Arthritis   . Carotid artery disease      Home Meds: Prior to  Admission medications   Medication Sig Start Date End Date Taking? Authorizing Provider  acetaminophen (TYLENOL) 500 MG tablet Take 1,000 mg by mouth every 6 (six) hours as needed. For pain   Yes Historical Provider, MD  amLODipine (NORVASC) 10 MG tablet Take 1 tablet (10 mg total) by mouth daily. 04/10/15  Yes Darlyne Russian, MD  aspirin 81 MG tablet Take 81 mg by mouth daily.   Yes Historical Provider, MD  clopidogrel (PLAVIX) 75 MG tablet Take 1 tablet (75 mg total) by mouth daily. 04/10/15  Yes Darlyne Russian, MD  glipiZIDE (GLUCOTROL) 5 MG tablet TAKE ONE-HALF TABLET BY MOUTH ONCE DAILY.  "OV NEEDED" 08/10/15  Yes Mancel Bale, PA-C  HYDROcodone-acetaminophen (NORCO) 10-325 MG per tablet Take 1 tablet by mouth every 6 (six) hours as needed for severe pain.   Yes Historical Provider, MD  lisinopril (PRINIVIL,ZESTRIL) 20 MG tablet Take 1 tablet (20 mg total) by mouth daily. 04/10/15  Yes Darlyne Russian, MD  pravastatin (PRAVACHOL) 40 MG tablet Take 1 tablet (40 mg total) by mouth daily. 04/10/15  Yes Darlyne Russian, MD  zolpidem (AMBIEN) 5 MG tablet Take 1 tablet (5 mg total) by mouth at bedtime as needed. for  sleep 04/10/15  Yes Darlyne Russian, MD    Allergies:  Allergies  Allergen Reactions  . Codeine Nausea And Vomiting  . Coreg [Carvedilol] Nausea And Vomiting  . Fentanyl   . Hycodan [Hydrocodone-Homatropine] Itching  . Lacosamide      Other name is, VIMPAT  . Levetiracetam Other (See Comments)    Strange feelings in head  . Metformin And Related   . Metoprolol   . Morphine And Related Itching  . Tessalon [Benzonatate]   . Other Rash    BETA BLOCKER    Social History   Social History  . Marital Status: Married    Spouse Name: N/A  . Number of Children: 2  . Years of Education: N/A   Occupational History  . Retired    Social History Main Topics  . Smoking status: Former Smoker    Quit date: 12/26/2005  . Smokeless tobacco: Never Used  . Alcohol Use: No  . Drug Use: No  .  Sexual Activity: No   Other Topics Concern  . Not on file   Social History Narrative     Review of Systems: Constitutional: negative for chills, fever, night sweats, weight changes; positive for fatigue  HEENT: negative for hearing loss, congestion, rhinorrhea, ST, epistaxis, or sinus pressure; positive for vision loss (close up) Cardiovascular: negative for chest pain or palpitations Respiratory: negative for hemoptysis, wheezing, shortness of breath, or cough Abdominal: negative for abdominal pain, nausea, vomiting, diarrhea, or constipation Dermatological: negative for rash Neurologic: negative for headache, dizziness, or syncope; positive for insomnia GU: positive for increased urinary frequency Musc: positive for back pain All other systems reviewed and are otherwise negative with the exception to those above and in the HPI.  Physical Exam: Blood pressure 150/86, pulse 79, temperature 98.3 F (36.8 C), temperature source Oral, resp. rate 18, height 4' 11.5" (1.511 m), weight 115 lb (52.164 kg), SpO2 96 %., Body mass index is 22.85 kg/(m^2). General: Well developed, well nourished; she is distressed with troubles with her family Head: Normocephalic, atraumatic, eyes without discharge, nares are without discharge. Bilateral auditory canals clear, TM's are without perforation, pearly grey and translucent with reflective cone of light bilaterally. Oral cavity moist, posterior pharynx without exudate, erythema, peritonsillar abscess, or post nasal drip.; missing cap on tooth number 8, bilateral cataracts  Neck: Supple. No thyromegaly. Full ROM. No lymphadenopathy. Lungs: Clear bilaterally to auscultation without wheezes, rales, or rhonchi. Breathing is unlabored. Heart: RRR with S1 S2. No murmurs, rubs, or gallops appreciated. Abdomen: Soft, non-tender, non-distended with normoactive bowel sounds. No hepatomegaly. No rebound/guarding. No obvious abdominal masses. Msk:  has spinal  stenosis of lumbar region Extremities/Skin: Warm and dry. No clubbing or cyanosis. No edema. No rashes or suspicious lesions. Neuro: Alert and oriented X 3. Moves all extremities spontaneously. CNII-XII grossly in tact. Psych:  Responds to questions appropriately with a normal affect.   Labs: Results for orders placed or performed in visit on 09/17/15  POCT Microscopic Urinalysis ([facility])  Result Value Ref Range   WBC,UR,HPF,POC Few (A) None WBC/hpf   RBC,UR,HPF,POC Few (A) None RBC/hpf   Bacteria Few (A) None   Mucus Absent Absent   Epithelial Cells, UR Per Microscopy Few (A) None cells/hpf  POCT urinalysis dipstick  Result Value Ref Range   Color, UA yellow yellow   Clarity, UA clear clear   Glucose, UA negative negative   Bilirubin, UA negative negative   Ketones, POC UA trace (5) (A) negative   Spec Grav,  UA >=1.030    Blood, UA trace-intact (A) negative   pH, UA 5.0    Protein Ur, POC >=300 (A) negative   Urobilinogen, UA 0.2    Nitrite, UA Negative Negative   Leukocytes, UA Negative Negative     ASSESSMENT AND PLAN:  80 y.o. year old female with  Significant stress: Husband in nursing home, broken tooth, failing vision, son died this last year. This chart was scribed in my presence and reviewed by me personally.    ICD-9-CM ICD-10-CM   1. Frequency of urination 788.41 R35.0 POCT Microscopic Urinalysis ([facility])     POCT urinalysis dipstick  2. Essential hypertension 401.9 I10 lisinopril (PRINIVIL,ZESTRIL) 40 MG tablet     COMPLETE METABOLIC PANEL WITH GFR  3. Spinal stenosis of lumbar region 724.02 M48.06   4. Cataracts, bilateral 366.9 H26.9 Ambulatory referral to Ophthalmology  5. Broken tooth, open, initial encounter 873.63 S02.5XXB   6. Insomnia 780.52 G47.00 mirtazapine (REMERON) 7.5 MG tablet  7. Depression with anxiety 300.4 F41.8      We provided patient with materials to try to get help for her tooth and eyes. Also, we are going to try Remeron to help with her  sleep and depression.  Signed, [name], MD 09/17/2015 12:32 PM

## 2015-09-22 ENCOUNTER — Telehealth: Payer: Self-pay

## 2015-09-22 ENCOUNTER — Ambulatory Visit (INDEPENDENT_AMBULATORY_CARE_PROVIDER_SITE_OTHER): Payer: Medicare Other | Admitting: Family Medicine

## 2015-09-22 VITALS — BP 180/80 | HR 85 | Temp 98.4°F | Resp 16 | Ht 59.5 in | Wt 113.8 lb

## 2015-09-22 DIAGNOSIS — G4700 Insomnia, unspecified: Secondary | ICD-10-CM

## 2015-09-22 DIAGNOSIS — F329 Major depressive disorder, single episode, unspecified: Secondary | ICD-10-CM | POA: Diagnosis not present

## 2015-09-22 DIAGNOSIS — I10 Essential (primary) hypertension: Secondary | ICD-10-CM | POA: Diagnosis not present

## 2015-09-22 DIAGNOSIS — R350 Frequency of micturition: Secondary | ICD-10-CM | POA: Diagnosis not present

## 2015-09-22 DIAGNOSIS — F32A Depression, unspecified: Secondary | ICD-10-CM

## 2015-09-22 LAB — POC MICROSCOPIC URINALYSIS (UMFC): Mucus: ABSENT

## 2015-09-22 LAB — POCT URINALYSIS DIP (MANUAL ENTRY)
Bilirubin, UA: NEGATIVE
Glucose, UA: NEGATIVE
Ketones, POC UA: NEGATIVE
Leukocytes, UA: NEGATIVE
Nitrite, UA: NEGATIVE
Protein Ur, POC: 100 — AB
Spec Grav, UA: 1.015
Urobilinogen, UA: 0.2
pH, UA: 7

## 2015-09-22 MED ORDER — TRAZODONE HCL 50 MG PO TABS: ORAL_TABLET | ORAL | Status: DC

## 2015-09-22 MED ORDER — AMLODIPINE BESYLATE 5 MG PO TABS: 5.0000 mg | ORAL_TABLET | Freq: Every day | ORAL | Status: DC

## 2015-09-22 NOTE — Progress Notes (Signed)
Chief Complaint:  Chief Complaint  Patient presents with  . Hypertension    follow up/ bp 202/76    HPI: Gabrielle [name] is a 80 y.o. female who reports to [facility] today complaining of  HTN , she has been measuring it at home and it has been ranging  180-200/70-90. She is supposed to be on norvasc 10 mg but was not aware of it. She has not been taking it. Having difficulaities with sleep as well.  She is spending much of her time at the SNF where her husband is. HEr son recenlty died from an MI. She is not sleeping well. She ahs CAD and also depression, CVA, renal cell ca. She deneis any HA, dizziness, CP, pedal edema, orthopnea, SOB or palpitations. She is not getting much sleep, was on 10 of ambien but now down to 5 mg due to requests of her PCP and potential SEs. She sttes even on [place] she was getting only a couple hrs of sleep. She was rx remeron but could not afford it. She is tired.   Past Medical History  Diagnosis Date  . Diabetes mellitus     1990  . Essential hypertension, benign   . Hyperlipidemia   . History of anemia   . History of gout   . Claudication   . CVA (cerebral vascular accident)     LOC DIZZINESS 2008  . Azotemia   . Neck pain   . Renal cell cancer 2001  . Seizure   . PAD (peripheral artery disease)     hx. LCEA, hx Lt renal art. stenting, occl rt renal artery, occluded bil SFAs, moderatie iliac disease,    . Arthritis   . Carotid artery disease    Past Surgical History  Procedure Laterality Date  . Nephrectomy      RIGHT  . Ureteral stent placement      LEFT  . Carotid endarterectomy      LEFT  . Pv angiogram  2004    Lt renal artery stent  . Nm myocar perf wall motion  07/12/2011    normal   Social History   Social History  . Marital Status: Married    Spouse Name: N/A  . Number of Children: 2  . Years of Education: N/A   Occupational History  . Retired    Social History Main Topics  . Smoking status: Former Smoker    Quit  date: 12/26/2005  . Smokeless tobacco: Never Used  . Alcohol Use: No  . Drug Use: No  . Sexual Activity: No   Other Topics Concern  . None   Social History Narrative   Family History  Problem Relation Age of Onset  . Cancer Mother   . Heart disease Father    Allergies  Allergen Reactions  . Codeine Nausea And Vomiting  . Coreg [Carvedilol] Nausea And Vomiting  . Fentanyl   . Hycodan [Hydrocodone-Homatropine] Itching  . Lacosamide      Other name is, VIMPAT  . Levetiracetam Other (See Comments)    Strange feelings in head  . Metformin And Related   . Metoprolol   . Morphine And Related Itching  . Tessalon [Benzonatate]   . Other Rash    BETA BLOCKER   Prior to Admission medications   Medication Sig Start Date End Date Taking? Authorizing Provider  acetaminophen (TYLENOL) 500 MG tablet Take 1,000 mg by mouth every 6 (six) hours as needed. For pain  Yes Historical Provider, MD  amLODipine (NORVASC) 10 MG tablet Take 1 tablet (10 mg total) by mouth daily. 04/10/15  Yes Darlyne Russian, MD  aspirin 81 MG tablet Take 81 mg by mouth daily.   Yes Historical Provider, MD  clopidogrel (PLAVIX) 75 MG tablet Take 1 tablet (75 mg total) by mouth daily. 04/10/15  Yes Darlyne Russian, MD  glipiZIDE (GLUCOTROL) 5 MG tablet TAKE ONE-HALF TABLET BY MOUTH ONCE DAILY.  "OV NEEDED" 08/10/15  Yes Mancel Bale, PA-C  HYDROcodone-acetaminophen (NORCO) 10-325 MG per tablet Take 1 tablet by mouth every 6 (six) hours as needed for severe pain.   Yes Historical Provider, MD  lisinopril (PRINIVIL,ZESTRIL) 40 MG tablet Take 1 tablet (40 mg total) by mouth daily. 09/17/15  Yes [name], MD  pravastatin (PRAVACHOL) 40 MG tablet Take 1 tablet (40 mg total) by mouth daily. 04/10/15  Yes Darlyne Russian, MD  zolpidem (AMBIEN) 5 MG tablet Take 1 tablet (5 mg total) by mouth at bedtime as needed. for sleep 04/10/15  Yes Darlyne Russian, MD  mirtazapine (REMERON) 7.5 MG tablet Take 1 tablet (7.5 mg total) by mouth  at bedtime. Patient not taking: Reported on 09/22/2015 09/17/15   [name], MD     ROS: The patient denies fevers, chills, night sweats, unintentional weight loss, chest pain, palpitations, wheezing, dyspnea on exertion, nausea, vomiting, abdominal pain, dysuria, hematuria, melena, numbness, weakness, or tingling.   All other systems have been reviewed and were otherwise negative with the exception of those mentioned in the HPI and as above.    PHYSICAL EXAM: Filed Vitals:   09/22/15 1744  BP: 180/80  Pulse: 85  Temp: 98.4 F (36.9 C)  Resp: 16   Body mass index is 22.61 kg/(m^2).   General: Alert, no acute distress HEENT:  Normocephalic, atraumatic, oropharynx patent. EOMI, PERRLA Cardiovascular:  Regular rate and rhythm, no rubs murmurs or gallops.  No Carotid bruits, radial pulse intact. No pedal edema.  Respiratory: Clear to auscultation bilaterally.  No wheezes, rales, or rhonchi.  No cyanosis, no use of accessory musculature Abdominal: No organomegaly, abdomen is soft and non-tender, positive bowel sounds. No masses. Skin: No rashes. Neurologic: Facial musculature symmetric. Psychiatric: Patient acts appropriately throughout our interaction. Lymphatic: No cervical or submandibular lymphadenopathy Musculoskeletal: Gait intact. No edema, tenderness   LABS: Results for orders placed or performed in visit on 09/22/15  POCT urinalysis dipstick  Result Value Ref Range   Color, UA yellow yellow   Clarity, UA clear clear   Glucose, UA negative negative   Bilirubin, UA negative negative   Ketones, POC UA negative negative   Spec Grav, UA 1.015    Blood, UA trace-intact (A) negative   pH, UA 7.0    Protein Ur, POC =100 (A) negative   Urobilinogen, UA 0.2    Nitrite, UA Negative Negative   Leukocytes, UA Negative Negative  POCT Microscopic Urinalysis ([facility])  Result Value Ref Range   WBC,UR,HPF,POC Few (A) None WBC/hpf   RBC,UR,HPF,POC Few (A) None RBC/hpf    Bacteria Few (A) None   Mucus Absent Absent   Epithelial Cells, UR Per Microscopy Few (A) None cells/hpf     EKG/XRAY:   Primary read interpreted by Dr. Marin Comment at [facility].   ASSESSMENT/PLAN: Encounter Diagnoses  Name Primary?  . Frequency of urination   . Essential hypertension Yes  . Insomnia   . Depression    She is doing well except is not taking all of  her bp medicine, supposed to be on lisinopril 40 mg and norvasc 10 mg as well. She currently is only on lisinopril and not on norvasc. She does not remember taking it. She has no CP or palpitations or HA, vision changes, n/v/abd pain,n/w/t She is also not sleeping well, was on ambien 1 mg but was decrease to 5 mg due to adverse SEs with ambien in elderly, she is still taking ambine She was put on remeron but it is too expensive so she never picked it up.  I have asked her to check her medicine cabinet, she will restart on norvasc 5 mg and if BP tolerates and she has no SEs then can continue on it but if BP still high then increase to 5 mg BID  She will be started on low dose trazodone and see if that helps.    Gross sideeffects, risk and benefits, and alternatives of medications d/w patient. Patient is aware that all medications have potential sideeffects and we are unable to predict every sideeffect or drug-drug interaction that may occur.  Thao Le DO  09/25/2015 11:30 AM

## 2015-09-22 NOTE — Telephone Encounter (Signed)
Bp up 203/78  Also, wants her lab results.   What is she to do?    [phone]

## 2015-09-29 ENCOUNTER — Encounter: Payer: Self-pay | Admitting: Emergency Medicine

## 2015-10-02 ENCOUNTER — Telehealth: Payer: Self-pay | Admitting: Family Medicine

## 2015-10-02 NOTE — Telephone Encounter (Signed)
LM to see how she is doing, BP and also insomnia and med changes. She was supposed to return to f/u but if better and bp is less than 150/90 and new insomnia medication ( trazadone ) is working nad no CP, Dizziness, palpitation sxs then ok to fu later but if no improvement then I will need to talk to her and/or she needs to come in to the office and see one of [place].

## 2015-10-02 NOTE — Telephone Encounter (Signed)
Patient returned Dr. Gus Puma phone call.

## 2015-10-05 NOTE — Telephone Encounter (Signed)
Feeling better , BP is less than 150/90 on norvasc 5 mg BID, she is takign trazadone 50 mg for sleep and is getting better sleep but is now worried about husband who fell again and they are at [place] with their daughter.

## 2015-10-06 ENCOUNTER — Other Ambulatory Visit: Payer: Self-pay | Admitting: Emergency Medicine

## 2015-10-07 NOTE — Telephone Encounter (Signed)
Faxed

## 2015-10-23 ENCOUNTER — Other Ambulatory Visit: Payer: Self-pay | Admitting: Family Medicine

## 2015-10-27 ENCOUNTER — Ambulatory Visit: Payer: Medicare Other | Admitting: Cardiovascular Disease

## 2015-11-02 ENCOUNTER — Other Ambulatory Visit: Payer: Self-pay | Admitting: Family Medicine

## 2015-11-12 ENCOUNTER — Encounter: Payer: Self-pay | Admitting: Family Medicine

## 2015-11-12 ENCOUNTER — Telehealth: Payer: Self-pay

## 2015-11-12 ENCOUNTER — Ambulatory Visit (INDEPENDENT_AMBULATORY_CARE_PROVIDER_SITE_OTHER): Payer: Medicare Other | Admitting: Family Medicine

## 2015-11-12 VITALS — BP 130/68 | HR 86 | Temp 98.4°F | Resp 16 | Ht 59.5 in | Wt 117.8 lb

## 2015-11-12 DIAGNOSIS — G4700 Insomnia, unspecified: Secondary | ICD-10-CM | POA: Diagnosis not present

## 2015-11-12 DIAGNOSIS — R351 Nocturia: Secondary | ICD-10-CM | POA: Diagnosis not present

## 2015-11-12 DIAGNOSIS — F329 Major depressive disorder, single episode, unspecified: Secondary | ICD-10-CM | POA: Diagnosis not present

## 2015-11-12 DIAGNOSIS — F32A Depression, unspecified: Secondary | ICD-10-CM

## 2015-11-12 DIAGNOSIS — R0981 Nasal congestion: Secondary | ICD-10-CM

## 2015-11-12 LAB — POCT URINALYSIS DIP (MANUAL ENTRY)
Bilirubin, UA: NEGATIVE
Blood, UA: NEGATIVE
Glucose, UA: NEGATIVE
Leukocytes, UA: NEGATIVE
Nitrite, UA: NEGATIVE
Protein Ur, POC: >=300 — AB
Spec Grav, UA: >=1.03
Urobilinogen, UA: 0.2
pH, UA: 5

## 2015-11-12 LAB — POC MICROSCOPIC URINALYSIS (UMFC)

## 2015-11-12 MED ORDER — TRIAZOLAM 0.25 MG PO TABS: 0.2500 mg | ORAL_TABLET | Freq: Every evening | ORAL | Status: DC | PRN

## 2015-11-12 MED ORDER — FLUCONAZOLE 150 MG PO TABS: 150.0000 mg | ORAL_TABLET | Freq: Once | ORAL | Status: DC

## 2015-11-12 MED ORDER — AMOXICILLIN 875 MG PO TABS: 875.0000 mg | ORAL_TABLET | Freq: Two times a day (BID) | ORAL | Status: DC

## 2015-11-12 NOTE — Telephone Encounter (Deleted)
Pt called to give [place]  Trazodone 50mg  1/2 pill a day Zolpidem-10mg 

## 2015-11-12 NOTE — Progress Notes (Addendum)
This chart was scribed for Gabrielle Haber, MD by [name], medical scribe at [facility].The patient was seen in exam room 1 and the patient's care was started at 2:56 PM.  Patient ID: ALPHIA SCALES MRN: [id number], DOB: [birth year], 80 y.o. Date of Encounter: 11/12/2015  Primary Physician: [name], MD  Chief Complaint:  Chief Complaint  Patient presents with  . trouble sleeping    x 3 days  . Sinusitis    sneezing, pressure in head/ x 4 days  . Urinary Frequency    HPI:  BURNIS Gabrielle [name] is a 80 y.o. female who presents to [facility] complaining of trouble sleeping for 3 days.  Sleeping She was still awake at 2:00AM this morning. Last night, she took Ambien 10 mg and still awake. She's also been worried about her husband who's in a nursing home. She noticed urinary frequency last night, but it's better this morning. She states that her husband would cry when she leaves to go home, and would cry when he wakes up in the morning because she's not there with him.   Hip surgery She needs surgery done on her hip but she needs to be there for her husband.   BP She's been checking her BP, this morning was 202/101. So, she took another lisinopril at 2:00PM today. She hasn't been checking her sugar because it's normally good.   Sinus pressure She also has some sinus pressure with sneezing for past 4 days. She notes that it feels like allergies.   Urinary incontinence: Patient complains of losing control of her urine when she coughs hard. She's having no dysuria but she is having polyuria. She is up every couple hours last night.  Past Medical History  Diagnosis Date  . Diabetes mellitus     1990  . Essential hypertension, benign   . Hyperlipidemia   . History of anemia   . History of gout   . Claudication ([place])   . CVA (cerebral vascular accident) ([place])     [place] DIZZINESS 2008  . Azotemia   . Neck pain   . Renal cell cancer  ([place]) 2001  . Seizure ([place])   . PAD (peripheral artery disease) ([facility])     hx. LCEA, hx Lt renal art. stenting, occl rt renal artery, occluded bil SFAs, moderatie iliac disease,    . Arthritis   . Carotid artery disease ([place])      Home Meds: Prior to Admission medications   Medication Sig Start Date End Date Taking? Authorizing Provider  acetaminophen (TYLENOL) 500 MG tablet Take 1,000 mg by mouth every 6 (six) hours as needed. For pain    Historical Provider, MD  amLODipine (NORVASC) 5 MG tablet TAKE ONE TABLET MOUTH ONCE DAILY 10/23/15   Thao P Le, DO  aspirin 81 MG tablet Take 81 mg by mouth daily.    Historical Provider, MD  clopidogrel (PLAVIX) 75 MG tablet Take 1 tablet (75 mg total) by mouth daily. 04/10/15   Darlyne Russian, MD  glipiZIDE (GLUCOTROL) 5 MG tablet TAKE ONE-HALF TABLET BY MOUTH ONCE DAILY.  "OV NEEDED" 08/10/15   Mancel Bale, PA-C  HYDROcodone-acetaminophen [facility]) 10-325 MG per tablet Take 1 tablet by mouth every 6 (six) hours as needed for severe pain.    Historical Provider, MD  lisinopril (PRINIVIL,ZESTRIL) 40 MG tablet Take 1 tablet (40 mg total) by mouth daily. 09/17/15   Gabrielle Haber, MD  mirtazapine (REMERON)  7.5 MG tablet Take 1 tablet (7.5 mg total) by mouth at bedtime. Patient not taking: Reported on 09/22/2015 09/17/15   Gabrielle Haber, MD  pravastatin (PRAVACHOL) 40 MG tablet Take 1 tablet (40 mg total) by mouth daily. 04/10/15   Darlyne Russian, MD  traZODone (DESYREL) 50 MG tablet TAKE ONE-HALF TABLET BY MOUTH ONCE DAILY IN THE EVENING AS NEEDED FOR INSOMNIA 11/03/15   Thao P Le, DO  zolpidem (AMBIEN) 5 MG tablet TAKE ONE TABLET BY MOUTH AT BEDTIME AS NEEDED FOR SLEEP 10/07/15   Darlyne Russian, MD    Allergies:  Allergies  Allergen Reactions  . Codeine Nausea And Vomiting  . Coreg [Carvedilol] Nausea And Vomiting  . Fentanyl   . Hycodan [Hydrocodone-Homatropine] Itching  . Lacosamide      Other name is, VIMPAT  . Levetiracetam Other (See Comments)     Strange feelings in head  . Metformin And Related   . Metoprolol   . Morphine And Related Itching  . Tessalon [Benzonatate]   . Other Rash    BETA BLOCKER    Social History   Social History  . Marital Status: Married    Spouse Name: N/A  . Number of Children: 2  . Years of Education: N/A   Occupational History  . Retired    Social History Main Topics  . Smoking status: Former Smoker    Quit date: 12/26/2005  . Smokeless tobacco: Never Used  . Alcohol Use: No  . Drug Use: No  . Sexual Activity: No   Other Topics Concern  . Not on file   Social History Narrative     Review of Systems: Constitutional: negative for fever, chills, night sweats, weight changes, or fatigue  HEENT: negative for vision changes, hearing loss, congestion, ST, epistaxis; positive for sinus pressure, sneezing Cardiovascular: negative for chest pain or palpitations Respiratory: negative for hemoptysis, wheezing, shortness of breath, or cough Abdominal: negative for abdominal pain, nausea, vomiting, diarrhea, or constipation Dermatological: negative for rash Musc: positive for hip pain (right)  GU: positive for urinary frequency Neurologic: negative for headache, dizziness, or syncope; positive for insomnia All other systems reviewed and are otherwise negative with the exception to those above and in the HPI.  Physical Exam: Blood pressure 130/68, pulse 86, temperature 98.4 F (36.9 C), temperature source Oral, resp. rate 16, height 4' 11.5" (1.511 m), weight 117 lb 12.8 oz (53.434 kg), SpO2 99 %., Body mass index is 23.4 kg/(m^2). General: Well developed, well nourished, in no acute distress. Head: Normocephalic, atraumatic, eyes without discharge, sclera non-icteric, nares are without discharge. Bilateral auditory canals clear, TM's are without perforation, pearly grey and translucent with reflective cone of light bilaterally. Oral cavity moist, posterior pharynx without exudate, erythema,  peritonsillar abscess, or post nasal drip;  Neck: Supple. No thyromegaly. Full ROM. No lymphadenopathy. Lungs: Clear bilaterally to auscultation without wheezes, rales, or rhonchi. Breathing is unlabored. Heart: RRR with S1 S2. No murmurs, rubs, or gallops appreciated. Msk:  Strength and tone normal for age. Extremities/Skin: Warm and dry. No clubbing or cyanosis. No edema. No rashes or suspicious lesions. Neuro: Alert and oriented X 3. Moves all extremities spontaneously. Gait is normal. CNII-XII grossly in tact. Psych:  Responds to questions appropriately with a normal affect.   Labs: Results for orders placed or performed in visit on 11/12/15  POCT urinalysis dipstick  Result Value Ref Range   Color, UA yellow yellow   Clarity, UA clear clear   Glucose, UA negative  negative   Bilirubin, UA negative negative   Ketones, POC UA small (15) (A) negative   Spec Grav, UA >=1.030    Blood, UA negative negative   pH, UA 5.0    Protein Ur, POC >=300 (A) negative   Urobilinogen, UA 0.2    Nitrite, UA Negative Negative   Leukocytes, UA Negative Negative  POCT Microscopic Urinalysis ([facility])  Result Value Ref Range   WBC,UR,HPF,POC None None WBC/hpf   RBC,UR,HPF,POC None None RBC/hpf   Bacteria Few (A) None, Too numerous to count   Mucus Present (A) Absent   Epithelial Cells, UR Per Microscopy Few (A) None, Too numerous to count cells/hpf   Results for orders placed or performed in visit on 11/12/15  POCT urinalysis dipstick  Result Value Ref Range   Color, UA yellow yellow   Clarity, UA clear clear   Glucose, UA negative negative   Bilirubin, UA negative negative   Ketones, POC UA small (15) (A) negative   Spec Grav, UA >=1.030    Blood, UA negative negative   pH, UA 5.0    Protein Ur, POC >=300 (A) negative   Urobilinogen, UA 0.2    Nitrite, UA Negative Negative   Leukocytes, UA Negative Negative  POCT Microscopic Urinalysis ([facility])  Result Value Ref Range   WBC,UR,HPF,POC None  None WBC/hpf   RBC,UR,HPF,POC None None RBC/hpf   Bacteria Few (A) None, Too numerous to count   Mucus Present (A) Absent   Epithelial Cells, UR Per Microscopy Few (A) None, Too numerous to count cells/hpf      ASSESSMENT AND PLAN:  80 y.o. year old female with  This chart was scribed in my presence and reviewed by me personally.    ICD-9-CM ICD-10-CM   1. Nocturia 788.43 R35.1 POCT urinalysis dipstick     POCT Microscopic Urinalysis ([facility])  2. Sinus congestion 478.19 R09.81   3. Depression 311 F32.9   4. Insomnia 780.52 G47.00 triazolam (HALCION) 0.25 MG tablet     By signing my name below, I, [name], attest that this documentation has been prepared under the direction and in the presence of Gabrielle Haber, MD. Electronically Signed: [name], Scribe. 11/12/2015 , 3:28 PM .  Signed, Gabrielle Haber, MD 11/12/2015 3:28 PM

## 2015-11-12 NOTE — Telephone Encounter (Signed)
Pt called to give [place] the name of the prescriptions she originally called about. She would like for Dr. Carlean Jews to write an Rx for: trazadone 50mg - 1/2 pill a day ambien 10 mg- 1/2 pill a day.

## 2015-11-12 NOTE — Telephone Encounter (Signed)
Wants two sleeping pills prescriptions both she was previously taking:  She takes  One of the pills one full tablet the other sleeping pill she takes  1/2 tablet.   She is not at home and does not know the  Names of the two sleeping pills.   She states Dr. Carlean Jews can look them up.     She can't afford the $48 dollars for the sleeping pill he wrote for her today.   Dr. Len Blalock [place]   [phone]

## 2015-11-13 MED ORDER — ZOLPIDEM TARTRATE 5 MG PO TABS: 5.0000 mg | ORAL_TABLET | Freq: Every evening | ORAL | Status: DC | PRN

## 2015-11-13 MED ORDER — TRAZODONE HCL 50 MG PO TABS: ORAL_TABLET | ORAL | Status: DC

## 2015-11-23 ENCOUNTER — Telehealth: Payer: Self-pay

## 2015-11-23 DIAGNOSIS — G479 Sleep disorder, unspecified: Secondary | ICD-10-CM

## 2015-11-23 NOTE — Telephone Encounter (Signed)
Pt requesting higher dose to 10 mg of Ambien,she did not want the newer sleep aid because it wasn't  Effective, She was prescribed  Ambien 5,but needs the higher dose   Best phone for pt is [phone]

## 2015-11-24 DIAGNOSIS — R350 Frequency of micturition: Secondary | ICD-10-CM | POA: Diagnosis not present

## 2015-11-24 DIAGNOSIS — R3915 Urgency of urination: Secondary | ICD-10-CM | POA: Diagnosis not present

## 2015-11-24 NOTE — Telephone Encounter (Signed)
SHould referral be under sleep study or neurology?

## 2015-11-24 NOTE — Telephone Encounter (Signed)
Spoke with pt, she states she would like a referral to see the specialist.

## 2015-11-24 NOTE — Telephone Encounter (Signed)
If she would like I can make her an appointment to see Dr. Brett Fairy a sleep specialist in town and see what medications would be safe for her to take and see if she gets better sleep.

## 2015-11-24 NOTE — Telephone Encounter (Signed)
Pt states something have got to give, she cant keep going on and not sleeping, the 5 mg doesn't work. States she is exhausted from getting 3 or 4 hrs of sleep. Please call [phone]

## 2015-11-24 NOTE — Telephone Encounter (Signed)
She is not allowed to take a higher dose and 5 mg at her age.

## 2015-11-25 NOTE — Telephone Encounter (Signed)
Order placed

## 2015-11-25 NOTE — Telephone Encounter (Signed)
Make the referral to Dr. Brett Fairy. Make it for consult sleep disturbance but not for sleep study. This referral is for consult.

## 2015-11-28 ENCOUNTER — Ambulatory Visit (INDEPENDENT_AMBULATORY_CARE_PROVIDER_SITE_OTHER): Payer: Medicare Other | Admitting: Family Medicine

## 2015-11-28 VITALS — BP 138/52 | HR 86 | Temp 98.6°F | Resp 18 | Ht 60.5 in | Wt 121.0 lb

## 2015-11-28 DIAGNOSIS — R21 Rash and other nonspecific skin eruption: Secondary | ICD-10-CM | POA: Diagnosis not present

## 2015-11-28 NOTE — Progress Notes (Signed)
[facility] [address], [place] [address] [id number] [phone]  Date:  11/28/2015   Name:  Gabrielle [name]   DOB:  [birth year]   MRN:  [id number]  PCP:  [name], MD    Chief Complaint: Rash   History of Present Illness:  Gabrielle [name] is a 80 y.o. very pleasant female patient who presents with the following:  Yesterday while getting a haircut the hairdresser noted a spot on the back of her head.  It does not itch or hurt- she had not been aware that it was there. The hairdresser was concerned about what this could be and wanted her to have this checked out Her husband is now in nursing care and this is working better for them.    Patient Active Problem List   Diagnosis Date Noted  . Spinal stenosis of lumbar region 05/21/2014  . Carotid artery disease ([place]) 02/26/2014  . PAD (peripheral artery disease) ([place])   . Renal cell cancer ([place])   . Diabetes mellitus   . Essential hypertension, benign   . Hyperlipidemia   . History of anemia   . CVA (cerebral vascular accident) ([place])   . Seizure [facility])     Past Medical History  Diagnosis Date  . Diabetes mellitus     1990  . Essential hypertension, benign   . Hyperlipidemia   . History of anemia   . History of gout   . Claudication ([place])   . CVA (cerebral vascular accident) ([place])     [place] DIZZINESS 2008  . Azotemia   . Neck pain   . Renal cell cancer ([place]) 2001  . Seizure ([place])   . PAD (peripheral artery disease) (H[facility])     hx. LCEA, hx Lt renal art. stenting, occl rt renal artery, occluded bil SFAs, moderatie iliac disease,    . Arthritis   . Carotid artery disease [facility])     Past Surgical History  Procedure Laterality Date  . Nephrectomy      RIGHT  . Ureteral stent placement      LEFT  . Carotid endarterectomy      LEFT  . Pv angiogram  2004    Lt renal artery stent  . Nm myocar perf wall motion  07/12/2011    normal    Social History  Substance Use Topics  . Smoking status: Former  Smoker    Quit date: 12/26/2005  . Smokeless tobacco: Never Used  . Alcohol Use: No    Family History  Problem Relation Age of Onset  . Cancer Mother   . Heart disease Father     Allergies  Allergen Reactions  . Codeine Nausea And Vomiting  . Coreg [Carvedilol] Nausea And Vomiting  . Fentanyl   . Hycodan [Hydrocodone-Homatropine] Itching  . Lacosamide      Other name is, VIMPAT  . Levetiracetam Other (See Comments)    Strange feelings in head  . Metformin And Related   . Metoprolol   . Morphine And Related Itching  . Tessalon [Benzonatate]   . Other Rash    BETA BLOCKER    Medication list has been reviewed and updated.  Current Outpatient Prescriptions on File Prior to Visit  Medication Sig Dispense Refill  . acetaminophen (TYLENOL) 500 MG tablet Take 1,000 mg by mouth every 6 (six) hours as needed. For pain    . amLODipine (NORVASC) 5 MG tablet TAKE ONE TABLET MOUTH ONCE DAILY 30 tablet 2  .  amoxicillin (AMOXIL) 875 MG tablet Take 1 tablet (875 mg total) by mouth 2 (two) times daily. 20 tablet 0  . aspirin 81 MG tablet Take 81 mg by mouth daily.    . clopidogrel (PLAVIX) 75 MG tablet Take 1 tablet (75 mg total) by mouth daily. 90 tablet 3  . fluconazole (DIFLUCAN) 150 MG tablet Take 1 tablet (150 mg total) by mouth once. 1 tablet 0  . glipiZIDE (GLUCOTROL) 5 MG tablet TAKE ONE-HALF TABLET BY MOUTH ONCE DAILY.  "OV NEEDED" 45 tablet 0  . HYDROcodone-acetaminophen (NORCO) 10-325 MG per tablet Take 1 tablet by mouth every 6 (six) hours as needed for severe pain.    Marland Kitchen lisinopril (PRINIVIL,ZESTRIL) 40 MG tablet Take 1 tablet (40 mg total) by mouth daily. 90 tablet 3  . mirtazapine (REMERON) 7.5 MG tablet Take 1 tablet (7.5 mg total) by mouth at bedtime. 30 tablet 2  . pravastatin (PRAVACHOL) 40 MG tablet Take 1 tablet (40 mg total) by mouth daily. 30 tablet 5  . traZODone (DESYREL) 50 MG tablet TAKE ONE-HALF TABLET BY MOUTH ONCE DAILY IN THE EVENING AS NEEDED FOR INSOMNIA 30  tablet 0  . triazolam (HALCION) 0.25 MG tablet Take 1 tablet (0.25 mg total) by mouth at bedtime as needed for sleep. 30 tablet 4  . zolpidem (AMBIEN) 5 MG tablet Take 1 tablet (5 mg total) by mouth at bedtime as needed. for sleep (Patient not taking: Reported on 11/28/2015) 90 tablet 0   No current facility-administered medications on file prior to visit.    Review of Systems:  As per HPI- otherwise negative.   Physical Examination: Filed Vitals:   11/28/15 1153  BP: 138/52  Pulse: 86  Temp: 98.6 F (37 C)  Resp: 18   Filed Vitals:   11/28/15 1153  Height: 5' 0.5" (1.537 m)  Weight: 121 lb (54.885 kg)   Body mass index is 23.23 kg/(m^2). Ideal Body Weight: Weight in (lb) to have BMI = 25: 129.9   GEN: WDWN, NAD, Non-toxic, Alert & Oriented x 3 HEENT: Atraumatic, Normocephalic.  Ears and Nose: No external deformity. EXTR: No clubbing/cyanosis/edema NEURO: Normal gait.  PSYCH: Normally interactive. Conversant. Not depressed or anxious appearing.  Calm demeanor.  She has some scaly, dry scalp on the posterior of her head.  Do not appreciate ringworm    Assessment and Plan: Rash and nonspecific skin eruption  Reassured that this seems to be just dandruff.  Encouraged an anti-dandruff shampoo, let me know if not better soon  Signed Lamar Blinks, MD

## 2015-11-28 NOTE — Patient Instructions (Signed)
Try some head and shoulders shampoo or selsun blue shampoo for your scalp.   Let us know if this does not improve soon

## 2015-12-01 ENCOUNTER — Telehealth: Payer: Self-pay

## 2015-12-01 ENCOUNTER — Institutional Professional Consult (permissible substitution): Payer: Medicare Other | Admitting: Neurology

## 2015-12-01 NOTE — Telephone Encounter (Signed)
Called and advised Gabrielle [name] that her appt with Dr. Brett Fairy needs to be rescheduled today because Dr. Brett Fairy is out sick. Gabrielle [name] is agreeable to coming in on 12/15 at 11:30.

## 2015-12-10 ENCOUNTER — Ambulatory Visit (INDEPENDENT_AMBULATORY_CARE_PROVIDER_SITE_OTHER): Payer: Medicare Other | Admitting: Neurology

## 2015-12-10 ENCOUNTER — Encounter: Payer: Self-pay | Admitting: Neurology

## 2015-12-10 VITALS — BP 140/58 | HR 88 | Resp 20 | Ht 61.0 in | Wt 122.0 lb

## 2015-12-10 DIAGNOSIS — F332 Major depressive disorder, recurrent severe without psychotic features: Secondary | ICD-10-CM | POA: Diagnosis not present

## 2015-12-10 DIAGNOSIS — G4700 Insomnia, unspecified: Secondary | ICD-10-CM | POA: Diagnosis not present

## 2015-12-10 DIAGNOSIS — F5104 Psychophysiologic insomnia: Secondary | ICD-10-CM | POA: Insufficient documentation

## 2015-12-10 MED ORDER — TRAZODONE HCL 50 MG PO TABS: ORAL_TABLET | ORAL | Status: DC

## 2015-12-10 MED ORDER — SERTRALINE HCL 25 MG PO TABS: 25.0000 mg | ORAL_TABLET | Freq: Every day | ORAL | Status: DC

## 2015-12-10 MED ORDER — DOXYLAMINE SUCCINATE (SLEEP) 25 MG PO TABS: 25.0000 mg | ORAL_TABLET | Freq: Every evening | ORAL | Status: DC | PRN

## 2015-12-10 NOTE — Patient Instructions (Signed)
Doxylamine chewable tablets What is this medicine? DOXYLAMINE (dox IL a meen) is an antihistamine. It is used to treat the itchy eyes and throat, runny nose, and sneezing of allergies. This medicine may be used for other purposes; ask your health care provider or pharmacist if you have questions. What should I tell my health care provider before I take this medicine? They need to know if you have any of these conditions: -glaucoma -heart disease -liver disease -lung or breathing disease, like asthma or emphysema -pain or trouble passing urine -prostate trouble -ulcers or other stomach problems -an unusual or allergic reaction to doxylamine, other medicines, foods, dyes, or preservatives -pregnant or trying to get pregnant -breast-feeding How should I use this medicine? Take this medicine by mouth with a glass of water. Chew it completely before swallowing. Follow the directions on the package or prescription label. Take your medicine at regular intervals. Do not take it more often than directed. Talk to your pediatrician regarding the use of this medicine in children. Special care may be needed. Patients over 44 years old may have a stronger reaction and need a smaller dose. Overdosage: If you think you have taken too much of this medicine contact a poison control center or emergency room at once. NOTE: This medicine is only for you. Do not share this medicine with others. What if I miss a dose? If you miss a dose, take it as soon as you can. If it is almost time for your next dose, take only that dose. Do not take double or extra doses. What may interact with this medicine? -alcohol -MAOIs like Carbex, Eldepryl, Marplan, Nardil, and Parnate -some medicines for allergies, cold, or cough -medicines that make you sleepy This list may not describe all possible interactions. Give your health care provider a list of all the medicines, herbs, non-prescription drugs, or dietary supplements you  use. Also tell them if you smoke, drink alcohol, or use illegal drugs. Some items may interact with your medicine. What should I watch for while using this medicine? Tell your doctor or healthcare professional if your symptoms do not start to get better or if they get worse. See your doctor right away if you get a high fever or have problems breathing. Your mouth may get dry. Chewing sugarless gum or sucking hard candy, and drinking plenty of water may help. Contact your doctor if the problem does not go away or is severe. This medicine may cause dry eyes and blurred vision. If you wear contact lenses you may feel some discomfort. Lubricating drops may help. See your eye doctor if the problem does not go away or is severe. You may get drowsy or dizzy. Do not drive, use machinery, or do anything that needs mental alertness until you know how this medicine affects you. Do not stand or sit up quickly, especially if you are an older patient. This reduces the risk of dizzy or fainting spells. Alcohol may interfere with the effect of this medicine. Avoid alcoholic drinks. What side effects may I notice from receiving this medicine? Side effects that you should report to your doctor or health care professional as soon as possible: -allergic reactions like skin rash, itching or hives, swelling of the face, lips, or tongue -changes in vision -confused, agitated, or nervous -fast, irregular heartbeat -feeling faint or lightheaded, falls -muscle or facial twitches -seizure -trouble passing urine or change in the amount of urine -unusual bleeding or bruising Side effects that usually do not require  medical attention (report to your doctor or health care professional if they continue or are bothersome): -dry mouth -headache -loss of appetite -stomach upset, vomiting This list may not describe all possible side effects. Call your doctor for medical advice about side effects. You may report side effects to  FDA at 1-800-FDA-1088. Where should I keep my medicine? Keep out of the reach of children. Store at room temperature between 15 and 30 degrees C (59 and 86 degrees F). Do not freeze. Protect from light. Throw away any unused medicine after the expiration date. NOTE: This sheet is a summary. It may not cover all possible information. If you have questions about this medicine, talk to your doctor, pharmacist, or health care provider.    2016, Elsevier/Gold Standard. (2008-04-14 16:51:19) Insomnia Insomnia is a sleep disorder that makes it difficult to fall asleep or to stay asleep. Insomnia can cause tiredness (fatigue), low energy, difficulty concentrating, mood swings, and poor performance at work or school.  There are three different ways to classify insomnia:  Difficulty falling asleep.  Difficulty staying asleep.  Waking up too early in the morning. Any type of insomnia can be long-term (chronic) or short-term (acute). Both are common. Short-term insomnia usually lasts for three months or less. Chronic insomnia occurs at least three times a week for longer than three months. CAUSES  Insomnia may be caused by another condition, situation, or substance, such as:  Anxiety.  Certain medicines.  Gastroesophageal reflux disease (GERD) or other gastrointestinal conditions.  Asthma or other breathing conditions.  Restless legs syndrome, sleep apnea, or other sleep disorders.  Chronic pain.  Menopause. This may include hot flashes.  Stroke.  Abuse of alcohol, tobacco, or illegal drugs.  Depression.  Caffeine.   Neurological disorders, such as Alzheimer disease.  An overactive thyroid (hyperthyroidism). The cause of insomnia may not be known. RISK FACTORS Risk factors for insomnia include:  Gender. Women are more commonly affected than men.  Age. Insomnia is more common as you get older.  Stress. This may involve your professional or personal life.  Income. Insomnia  is more common in people with lower income.  Lack of exercise.   Irregular work schedule or night shifts.  Traveling between different time zones. SIGNS AND SYMPTOMS If you have insomnia, trouble falling asleep or trouble staying asleep is the main symptom. This may lead to other symptoms, such as:  Feeling fatigued.  Feeling nervous about going to sleep.  Not feeling rested in the morning.  Having trouble concentrating.  Feeling irritable, anxious, or depressed. TREATMENT  Treatment for insomnia depends on the cause. If your insomnia is caused by an underlying condition, treatment will focus on addressing the condition. Treatment may also include:   Medicines to help you sleep.  Counseling or therapy.  Lifestyle adjustments. HOME CARE INSTRUCTIONS   Take medicines only as directed by your health care provider.  Keep regular sleeping and waking hours. Avoid naps.  Keep a sleep diary to help you and your health care provider figure out what could be causing your insomnia. Include:   When you sleep.  When you wake up during the night.  How well you sleep.   How rested you feel the next day.  Any side effects of medicines you are taking.  What you eat and drink.   Make your bedroom a comfortable place where it is easy to fall asleep:  Put up shades or special blackout curtains to block light from outside.  Use a  white noise machine to block noise.  Keep the temperature cool.   Exercise regularly as directed by your health care provider. Avoid exercising right before bedtime.  Use relaxation techniques to manage stress. Ask your health care provider to suggest some techniques that may work well for you. These may include:  Breathing exercises.  Routines to release muscle tension.  Visualizing peaceful scenes.  Cut back on alcohol, caffeinated beverages, and cigarettes, especially close to bedtime. These can disrupt your sleep.  Do not overeat or eat  spicy foods right before bedtime. This can lead to digestive discomfort that can make it hard for you to sleep.  Limit screen use before bedtime. This includes:  Watching TV.  Using your smartphone, tablet, and computer.  Stick to a routine. This can help you fall asleep faster. Try to do a quiet activity, brush your teeth, and go to bed at the same time each night.  Get out of bed if you are still awake after 15 minutes of trying to sleep. Keep the lights down, but try reading or doing a quiet activity. When you feel sleepy, go back to bed.  Make sure that you drive carefully. Avoid driving if you feel very sleepy.  Keep all follow-up appointments as directed by your health care provider. This is important. SEEK MEDICAL CARE IF:   You are tired throughout the day or have trouble in your daily routine due to sleepiness.  You continue to have sleep problems or your sleep problems get worse. SEEK IMMEDIATE MEDICAL CARE IF:   You have serious thoughts about hurting yourself or someone else.   This information is not intended to replace advice given to you by your health care provider. Make sure you discuss any questions you have with your health care provider.   Document Released: 12/09/2000 Document Revised: 09/02/2015 Document Reviewed: 09/12/2014 Elsevier Interactive Patient Education [employer].

## 2015-12-10 NOTE — Progress Notes (Signed)
SLEEP MEDICINE CLINIC   Provider:  Larey Seat, M D  Referring Provider: Darlyne Russian, MD Primary Care Physician:  [name], MD  Chief Complaint  Patient presents with  . New Patient (Initial Visit)    pt doesn't sleep well, she reports that her doctor said she shouldn't take ambien 10 mg because of her age, says she has tried multiple sleep aids, rm 10, alone    HPI:  Gabrielle [name] is a 80 y.o. female , seen here as a referral from Dr. [name] for chronic insomnia,  Chief complaint according to patient :" I can fall asleep with medication and not stay asleep"  Mrs. Gabrielle [name] reports that she was told by her primary care physician that she should no longer used 10 mg of Ambien nightly as it can cause significant side effects at her age. However since this dose was changed she has not been able to sleep well. She has been tried on Desyrel , also known as trazodone , and has no refills for this medication.  She is no longer using triazolam or Ambien as she has not received medication prescriptions refills after this appointment was made. She was tried last on Remeron 7.5 mg tablets and she seems to use this currently at bedtime. She states that she may fall asleep but she cannot stay asleep. She cannot remember which medications were prescribed and cannot tell me how long she used any either.  The patient experienced significant rebound insomnia since she has no longer been able to take Ambien. He endorsed nothing else in her review of systems, I reviewed her medication list with her, I'm also concerned that she still has Narco available to her for pain she has spinal stenosis, a painful condition and uses the medication daily. She has held off on any surgical attendants because her husband has moved on to a nursing home she is visiting her daily but she hasn't her care for herself or for him otherwise. The couples son is age 40 and died suddenly of a heart attack in February 2016. Since  then there is no local  family member available.  Sleep habits are as follows: Patient usually goes to bed around 10:00. She is taking currently trazodone a half tablet of 50 mg dose and she takes melatonin over-the-counter. 3 mg. He assured me that she doesn't take Narco at night. It will take her about 30 minutes to fall asleep with the help of trazodone 25 mg, she will stay asleep until 1 AM she routinely wakes up between 1 and 1:30. She has great difficulties going back to sleep after that. This morning she was on the able to go back to sleep at about 4:30 and slept until 5 AM. She is sleep deprived at this point. She usually rises at about 5:30 giving up on getting any more sleep. She usually drives in the morning to [employer] where she gets a biscuit and coffee. She  has caffeine in the morning from of coffee and iced tea at lunch and dinner.  she visits her demented husband every day , has a desire to stay with him during the day and usually needs his nursing home on [address] at around 8 PM. She eats there as well. When her husband still livedwith her at home he had never mentioned to her that she would  snore.   There has been no reported history of apnea, hers was never considered an organic sleep disorder.  The patient had a sleep study at the [facility]  sleep center in 2005, 11 years ago.  At the time she had been diagnosed with depression- her Becks inventory had been endorsed at 19 points. The polysomnography was reviewed here today again sleep efficiency was only 60%, she did not have oxygen desaturations she did have a normal EEG and EKG and the diagnosis was that some snoring had been recorded but not apnea. The AHI was 1.43, negligible. She was awake for much of the night. The tech had noticed that she snored mildly on the side and loudly when in supine.   Sleep medical history and family sleep history:  Insomnia since the 1990.  She had a sleep study in 2005 that was  negative for apnea and snoring and was offered referral to insomnia counseling.    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   Review of Systems: Out of a complete 14 system review, the patient complains of only the following symptoms, and all other reviewed systems are negative.  none endorsed   Epworth score 0-9 , Fatigue severity score  37  , geriatric depression score 4    Social History   Social History  . Marital Status: Married    Spouse Name: N/A  . Number of Children: 2  . Years of Education: N/A   Occupational History  . Retired    Social History Main Topics  . Smoking status: Former Smoker    Quit date: 12/26/2005  . Smokeless tobacco: Never Used  . Alcohol Use: No  . Drug Use: No  . Sexual Activity: No   Other Topics Concern  . Not on file   Social History Narrative    Family History  Problem Relation Age of Onset  . Cancer Mother   . Heart disease Father     Past Medical History  Diagnosis Date  . Diabetes mellitus     1990  . Essential hypertension, benign   . Hyperlipidemia   . History of anemia   . History of gout   . Claudication ([place])   . CVA (cerebral vascular accident) ([place])     [place] DIZZINESS 2008  . Azotemia   . Neck pain   . Renal cell cancer ([place]) 2001  . Seizure ([place])   . PAD (peripheral artery disease) ([facility])     hx. LCEA, hx Lt renal art. stenting, occl rt renal artery, occluded bil SFAs, moderatie iliac disease,    . Arthritis   . Carotid artery disease [facility])     Past Surgical History  Procedure Laterality Date  . Nephrectomy      RIGHT  . Ureteral stent placement      LEFT  . Carotid endarterectomy      LEFT  . Pv angiogram  2004    Lt renal artery stent  . Nm myocar perf wall motion  07/12/2011    normal    Current Outpatient Prescriptions  Medication Sig Dispense Refill  . acetaminophen (TYLENOL) 500 MG tablet Take 1,000 mg by mouth every 6 (six) hours as  needed. For pain    . amLODipine (NORVASC) 5 MG tablet TAKE ONE TABLET MOUTH ONCE DAILY 30 tablet 2  . amoxicillin (AMOXIL) 875 MG tablet Take 1 tablet (875 mg total) by mouth 2 (two) times daily. 20 tablet 0  . aspirin 81 MG tablet Take 81 mg by mouth daily.    . clopidogrel (PLAVIX) 75  MG tablet Take 1 tablet (75 mg total) by mouth daily. 90 tablet 3  . glipiZIDE (GLUCOTROL) 5 MG tablet TAKE ONE-HALF TABLET BY MOUTH ONCE DAILY.  "OV NEEDED" 45 tablet 0  . HYDROcodone-acetaminophen (NORCO) 10-325 MG per tablet Take 1 tablet by mouth every 6 (six) hours as needed for severe pain.    Marland Kitchen lisinopril (PRINIVIL,ZESTRIL) 40 MG tablet Take 1 tablet (40 mg total) by mouth daily. 90 tablet 3  . mirabegron ER (MYRBETRIQ) 50 MG TB24 tablet Take 50 mg by mouth daily.    . mirtazapine (REMERON) 7.5 MG tablet Take 1 tablet (7.5 mg total) by mouth at bedtime. 30 tablet 2  . pravastatin (PRAVACHOL) 40 MG tablet Take 1 tablet (40 mg total) by mouth daily. 30 tablet 5  . traZODone (DESYREL) 50 MG tablet TAKE ONE-HALF TABLET BY MOUTH ONCE DAILY IN THE EVENING AS NEEDED FOR INSOMNIA 30 tablet 0  . triazolam (HALCION) 0.25 MG tablet Take 1 tablet (0.25 mg total) by mouth at bedtime as needed for sleep. (Patient not taking: Reported on 12/10/2015) 30 tablet 4  . zolpidem (AMBIEN) 5 MG tablet Take 1 tablet (5 mg total) by mouth at bedtime as needed. for sleep (Patient not taking: Reported on 11/28/2015) 90 tablet 0   No current facility-administered medications for this visit.    Allergies as of 12/10/2015 - Review Complete 12/10/2015  Allergen Reaction Noted  . Codeine Nausea And Vomiting 01/17/2012  . Coreg [carvedilol] Nausea And Vomiting 09/09/2014  . Fentanyl  08/21/2012  . Hycodan [hydrocodone-homatropine] Itching 01/17/2012  . Lacosamide  03/16/2012  . Levetiracetam Other (See Comments) 03/16/2012  . Metformin and related  01/17/2012  . Metoprolol  01/17/2012  . Morphine and related Itching 08/21/2012  .  Tessalon [benzonatate]  12/29/2012  . Other Rash 01/17/2012    Vitals: BP 140/58 mmHg  Pulse 88  Resp 20  Ht 5\' 1"  (1.549 m)  Wt 122 lb (55.339 kg)  BMI 23.06 kg/m2 Last Weight:  Wt Readings from Last 1 Encounters:  12/10/15 122 lb (55.339 kg)   [id number] mass index is 23.06 kg/(m^2).     Last Height:   Ht Readings from Last 1 Encounters:  12/10/15 5\' 1"  (1.549 m)    Physical exam:  General: The patient is awake, alert and appears not in acute distress.  Head: Normocephalic, atraumatic. Neck is supple. Mallampati 1  neck circumference: 13 . Nasal airflow unestricted , TMJ is not evident . Retrognathia is not seen. The patient has a poor dental status , no dentures.  Cardiovascular:  Regular rate and rhythm , without  murmurs or carotid bruit, and without distended neck veins. Respiratory: Lungs are clear to auscultation. Skin:  Without evidence of edema, or rash Trunk: posture is hunched.   Neurologic exam : The patient is awake and alert, oriented to place and time.   Memory subjective described as intact. She cannot recall the names  of medications and duration of several therapies attempted.   Attention span & concentration ability appears normal.  Speech is fluent,  without  dysarthria, dysphonia or aphasia.  Mood and affect are appropriate.  Cranial nerves: Pupils are equal and briskly reactive to light. Funduscopic exam without  evidence of pallor or edema. Extraocular movements  in vertical and horizontal planes intact and without nystagmus. Visual fields by finger perimetry are intact. Hearing to finger rub intact.   Facial sensation intact to fine touch.  Facial motor strength is symmetric and tongue and uvula move  midline. Shoulder shrug was symmetrical.   Motor exam:  Normal tone, muscle bulk and symmetric strength in all extremities.  Sensory:  Fine touch, pinprick and vibration were tested in all extremities. Proprioception tested in the upper extremities was  normal.  Coordination: Rapid alternating movements in the fingers/hands was normal. Finger-to-nose maneuver  normal without evidence of ataxia, dysmetria or tremor.  Gait and station: Patient walks without assistive device and is able unassisted to climb up to the exam table. Strength within normal limits.  Stance is stable and normal.    Deep tendon reflexes: in the  upper and lower extremities are brisk, symmetric and intact.   The patient was advised of the nature of the diagnosed sleep disorder , the treatment options and risks for general a health and wellness arising from not treating the condition.  I spent more than 30 minutes of face to face time with the patient. Greater than 50% of time was spent in counseling and coordination of care.   We have discussed the diagnosis and differential and I answered the patient's questions.   Mrs. Lusby has a long-standing insomnia and even her 80 year old sleep study concluded that she has a non-organic sleep disorder. It suggested treating her for snoring but mainly doing this by positional therapy. In the meanwhile she has developed spinal stenosis which makes it harder for her to find a comfortable sleep position. She prefers to sleep on her side. She may benefit from a dental device to treat snoring or from an ENT procedure but this is also restricted by her financial limitations.  She does not have dental insurance. She reports that her hip pain and lower back pain are not keeping her forms sleeping but it is a habitual waking up.   I think there is definitely some depression present as it was 11 years ago. I like the idea of treating her with trazodone and I will be happy to refill that medication but she reported she doesn't respond.  I also would not be opposed to start ZOLOFT.    She is usually considered a high risk population to continuously use Ambien, but the patient has to some degree deep becoming dependent on the Ambien.  So I like  for her to try zoloft  And cont melatonin for the next couple of weeks and if this does not allow her to get at least 4-5 hours of sleep I would like for her to see an insomnia specialist counselor.   Assessment:  After physical and neurologic examination, review of laboratory studies,  Personal review of imaging studies, reports of other /same  Imaging studies ,  Results of polysomnography/ neurophysiology testing and pre-existing records as far as provided in visit., my assessment is   1)  I will prescribe Zoloft low-dose to be taken in the morning this usually helps patients to sleep longer it may not help her to fall asleep but it can help her to stay asleep she is allowed to combine this with melatonin. 2) I like for her to try [employer] and over-the-counter sleep aid at night- doxylamine is the  generic name. She can purchase this at the pharmacy under their own name there is a [facility] [address]. 3) Belsomra  will not be affordable for this patient. 4) snoring device to the dentist is not affordable for this patient.  Plan:  Treatment plan and additional workup :  I will kindly ask Dr. [name] to  continue prescribing the Zoloft should this help the patient's insomnia. I will initiate this medication. I would strongly recommend to see an insomnia specialist counselor even chronic insomnia can often be improved through  behavior therapy.   Asencion Partridge Jaimen Melone MD  12/10/2015   CC: Darlyne Russian, [address] [place], [place] [id number]

## 2015-12-31 ENCOUNTER — Other Ambulatory Visit: Payer: Self-pay

## 2015-12-31 DIAGNOSIS — G4700 Insomnia, unspecified: Secondary | ICD-10-CM

## 2015-12-31 MED ORDER — TRAZODONE HCL 50 MG PO TABS: ORAL_TABLET | ORAL | Status: DC

## 2016-01-06 ENCOUNTER — Other Ambulatory Visit: Payer: Self-pay | Admitting: Physician Assistant

## 2016-01-06 ENCOUNTER — Other Ambulatory Visit: Payer: Self-pay | Admitting: Family Medicine

## 2016-02-03 ENCOUNTER — Other Ambulatory Visit: Payer: Self-pay | Admitting: Physician Assistant

## 2016-02-14 ENCOUNTER — Other Ambulatory Visit: Payer: Self-pay | Admitting: Physician Assistant

## 2016-02-18 ENCOUNTER — Other Ambulatory Visit: Payer: Self-pay | Admitting: Emergency Medicine

## 2016-02-22 DIAGNOSIS — N183 Chronic kidney disease, stage 3 (moderate): Secondary | ICD-10-CM | POA: Diagnosis not present

## 2016-02-22 DIAGNOSIS — N2581 Secondary hyperparathyroidism of renal origin: Secondary | ICD-10-CM | POA: Diagnosis not present

## 2016-02-22 DIAGNOSIS — N189 Chronic kidney disease, unspecified: Secondary | ICD-10-CM | POA: Diagnosis not present

## 2016-02-24 DIAGNOSIS — N3281 Overactive bladder: Secondary | ICD-10-CM | POA: Diagnosis not present

## 2016-02-25 DIAGNOSIS — E119 Type 2 diabetes mellitus without complications: Secondary | ICD-10-CM | POA: Diagnosis not present

## 2016-02-25 DIAGNOSIS — H25813 Combined forms of age-related cataract, bilateral: Secondary | ICD-10-CM | POA: Diagnosis not present

## 2016-02-29 DIAGNOSIS — H2512 Age-related nuclear cataract, left eye: Secondary | ICD-10-CM | POA: Diagnosis not present

## 2016-02-29 DIAGNOSIS — H25812 Combined forms of age-related cataract, left eye: Secondary | ICD-10-CM | POA: Diagnosis not present

## 2016-02-29 DIAGNOSIS — H52202 Unspecified astigmatism, left eye: Secondary | ICD-10-CM | POA: Diagnosis not present

## 2016-03-02 DIAGNOSIS — M4806 Spinal stenosis, lumbar region: Secondary | ICD-10-CM | POA: Diagnosis not present

## 2016-03-08 DIAGNOSIS — H2511 Age-related nuclear cataract, right eye: Secondary | ICD-10-CM | POA: Diagnosis not present

## 2016-03-14 DIAGNOSIS — H25811 Combined forms of age-related cataract, right eye: Secondary | ICD-10-CM | POA: Diagnosis not present

## 2016-03-14 DIAGNOSIS — H2511 Age-related nuclear cataract, right eye: Secondary | ICD-10-CM | POA: Diagnosis not present

## 2016-03-16 DIAGNOSIS — M4806 Spinal stenosis, lumbar region: Secondary | ICD-10-CM | POA: Diagnosis not present

## 2016-03-16 DIAGNOSIS — M5136 Other intervertebral disc degeneration, lumbar region: Secondary | ICD-10-CM | POA: Diagnosis not present

## 2016-03-16 DIAGNOSIS — M5410 Radiculopathy, site unspecified: Secondary | ICD-10-CM | POA: Diagnosis not present

## 2016-03-19 ENCOUNTER — Other Ambulatory Visit: Payer: Self-pay | Admitting: Emergency Medicine

## 2016-04-12 DIAGNOSIS — M5416 Radiculopathy, lumbar region: Secondary | ICD-10-CM | POA: Diagnosis not present

## 2016-04-19 ENCOUNTER — Other Ambulatory Visit: Payer: Self-pay | Admitting: Emergency Medicine

## 2016-05-03 ENCOUNTER — Ambulatory Visit (INDEPENDENT_AMBULATORY_CARE_PROVIDER_SITE_OTHER): Payer: Medicare Other | Admitting: Emergency Medicine

## 2016-05-03 ENCOUNTER — Other Ambulatory Visit: Payer: Self-pay | Admitting: Emergency Medicine

## 2016-05-03 VITALS — BP 133/73 | HR 85 | Temp 98.1°F | Ht 60.5 in | Wt 126.5 lb

## 2016-05-03 DIAGNOSIS — G4700 Insomnia, unspecified: Secondary | ICD-10-CM | POA: Diagnosis not present

## 2016-05-03 DIAGNOSIS — E785 Hyperlipidemia, unspecified: Secondary | ICD-10-CM | POA: Diagnosis not present

## 2016-05-03 DIAGNOSIS — M4806 Spinal stenosis, lumbar region: Secondary | ICD-10-CM

## 2016-05-03 DIAGNOSIS — R739 Hyperglycemia, unspecified: Secondary | ICD-10-CM

## 2016-05-03 DIAGNOSIS — D509 Iron deficiency anemia, unspecified: Secondary | ICD-10-CM | POA: Diagnosis not present

## 2016-05-03 DIAGNOSIS — I10 Essential (primary) hypertension: Secondary | ICD-10-CM | POA: Diagnosis not present

## 2016-05-03 DIAGNOSIS — F32A Depression, unspecified: Secondary | ICD-10-CM

## 2016-05-03 DIAGNOSIS — F329 Major depressive disorder, single episode, unspecified: Secondary | ICD-10-CM | POA: Diagnosis not present

## 2016-05-03 DIAGNOSIS — D500 Iron deficiency anemia secondary to blood loss (chronic): Secondary | ICD-10-CM

## 2016-05-03 DIAGNOSIS — M48061 Spinal stenosis, lumbar region without neurogenic claudication: Secondary | ICD-10-CM

## 2016-05-03 LAB — POCT CBC
Granulocyte percent: 68.7 %G (ref 37–80)
HCT, POC: 30.1 % — AB (ref 37.7–47.9)
Hemoglobin: 10.5 g/dL — AB (ref 12.2–16.2)
Lymph, poc: 1.5 (ref 0.6–3.4)
MCH, POC: 28.2 pg (ref 27–31.2)
MCHC: 34.8 g/dL (ref 31.8–35.4)
MCV: 81 fL (ref 80–97)
MID (cbc): 0.4 (ref 0–0.9)
MPV: 9 fL (ref 0–99.8)
POC Granulocyte: 4.2 (ref 2–6.9)
POC LYMPH PERCENT: 25.3 %L (ref 10–50)
POC MID %: 6 %M (ref 0–12)
Platelet Count, POC: 333 10*3/uL (ref 142–424)
RBC: 3.71 M/uL — AB (ref 4.04–5.48)
RDW, POC: 15.3 %
WBC: 6.1 10*3/uL (ref 4.6–10.2)

## 2016-05-03 LAB — LIPID PANEL
Cholesterol: 145 mg/dL (ref 125–200)
HDL: 61 mg/dL (ref >=46)
LDL Cholesterol: 59 mg/dL (ref <130)
Total CHOL/HDL Ratio: 2.4 Ratio (ref <=5.0)
Triglycerides: 124 mg/dL (ref <150)
VLDL: 25 mg/dL (ref <30)

## 2016-05-03 LAB — POCT GLYCOSYLATED HEMOGLOBIN (HGB A1C): Hemoglobin A1C: 5.8

## 2016-05-03 LAB — COMPLETE METABOLIC PANEL WITH GFR
ALT: 9 U/L (ref 6–29)
AST: 15 U/L (ref 10–35)
Albumin: 3.9 g/dL (ref 3.6–5.1)
Alkaline Phosphatase: 60 U/L (ref 33–130)
BUN: 15 mg/dL (ref 7–25)
CO2: 27 mmol/L (ref 20–31)
Calcium: 8.6 mg/dL (ref 8.6–10.4)
Chloride: 103 mmol/L (ref 98–110)
Creat: 1.54 mg/dL — ABNORMAL HIGH (ref 0.60–0.93)
GFR, Est African American: 37 mL/min — ABNORMAL LOW (ref >=60)
GFR, Est Non African American: 32 mL/min — ABNORMAL LOW (ref >=60)
Glucose, Bld: 84 mg/dL (ref 65–99)
Potassium: 4.3 mmol/L (ref 3.5–5.3)
Sodium: 140 mmol/L (ref 135–146)
Total Bilirubin: 0.3 mg/dL (ref 0.2–1.2)
Total Protein: 6.7 g/dL (ref 6.1–8.1)

## 2016-05-03 LAB — FERRITIN: Ferritin: 9 ng/mL — ABNORMAL LOW (ref 20–288)

## 2016-05-03 LAB — IRON AND TIBC
%SAT: 8 % — ABNORMAL LOW (ref 11–50)
Iron: 27 ug/dL — ABNORMAL LOW (ref 45–160)
TIBC: 341 ug/dL (ref 250–450)
UIBC: 314 ug/dL (ref 125–400)

## 2016-05-03 LAB — GLUCOSE, POCT (MANUAL RESULT ENTRY): POC Glucose: 95 mg/dl (ref 70–99)

## 2016-05-03 MED ORDER — PRAVASTATIN SODIUM 40 MG PO TABS: ORAL_TABLET | ORAL | Status: DC

## 2016-05-03 MED ORDER — FERROUS SULFATE 325 (65 FE) MG PO TABS: 325.0000 mg | ORAL_TABLET | Freq: Every day | ORAL | Status: DC

## 2016-05-03 MED ORDER — TRAZODONE HCL 50 MG PO TABS: ORAL_TABLET | ORAL | Status: DC

## 2016-05-03 NOTE — Patient Instructions (Signed)
     IF you received an x-ray today, you will receive an invoice from [facility]. Please contact [facility] at [phone] with questions or concerns regarding your invoice.   IF you received labwork today, you will receive an invoice from [facility]. Please contact [facility] at [phone] with questions or concerns regarding your invoice.   Our billing staff will not be able to assist you with questions regarding bills from these companies.  You will be contacted with the lab results as soon as they are available. The fastest way to get your results is to activate your My Chart account. Instructions are located on the last page of this paperwork. If you have not heard from us regarding the results in 2 weeks, please contact this office.      

## 2016-05-03 NOTE — Progress Notes (Addendum)
By signing my name below, I, Gabrielle [name], attest that this documentation has been prepared under the direction and in the presence of Gabrielle Queen, MD.  Electronically Signed: Verlee [name], Medical Scribe. 05/03/2016. 11:09 AM.  Chief Complaint:  Chief Complaint  Patient presents with  . Medication Refill    pravastatin,triazolam    HPI: Gabrielle [name] is a 81 y.o. female who reports to [facility] today for a medication refill. She reports regularly taking her bp medication at home, and hasn't run out yet.   She mentions still being at home but her husband is in a nursing home. She reports having cataracts surgery about a month ago. She mentions her eyes are feeling better.  BP reading in Left Arm: 130/60  BP reading in Right Arm: 138/80  Past Medical History  Diagnosis Date  . Diabetes mellitus     1990  . Essential hypertension, benign   . Hyperlipidemia   . History of anemia   . History of gout   . Claudication ([place])   . CVA (cerebral vascular accident) ([place])     [place] DIZZINESS 2008  . Azotemia   . Neck pain   . Renal cell cancer ([place]) 2001  . Seizure ([place])   . PAD (peripheral artery disease) ([facility])     hx. LCEA, hx Lt renal art. stenting, occl rt renal artery, occluded bil SFAs, moderatie iliac disease,    . Arthritis   . Carotid artery disease [facility]    Past Surgical History  Procedure Laterality Date  . Nephrectomy      RIGHT  . Ureteral stent placement      LEFT  . Carotid endarterectomy      LEFT  . Pv angiogram  2004    Lt renal artery stent  . Nm myocar perf wall motion  07/12/2011    normal   Social History   Social History  . Marital Status: Married    Spouse Name: N/A  . Number of Children: 2  . Years of Education: N/A   Occupational History  . Retired    Social History Main Topics  . Smoking status: Former Smoker    Quit date: 12/26/2005  . Smokeless tobacco: Never Used  . Alcohol Use: No  . Drug Use: No  . Sexual Activity: No    Other Topics Concern  . None   Social History Narrative   Family History  Problem Relation Age of Onset  . Cancer Mother   . Heart disease Father    Allergies  Allergen Reactions  . Codeine Nausea And Vomiting  . Coreg [Carvedilol] Nausea And Vomiting  . Fentanyl   . Hycodan [Hydrocodone-Homatropine] Itching  . Lacosamide      Other name is, VIMPAT  . Levetiracetam Other (See Comments)    Strange feelings in head  . Metformin And Related   . Metoprolol   . Morphine And Related Itching  . Tessalon [Benzonatate]   . Other Rash    BETA BLOCKER   Prior to Admission medications   Medication Sig Start Date End Date Taking? Authorizing Provider  acetaminophen (TYLENOL) 500 MG tablet Take 1,000 mg by mouth every 6 (six) hours as needed. For pain    Historical Provider, MD  amLODipine (NORVASC) 5 MG tablet TAKE ONE TABLET BY MOUTH ONCE DAILY 01/08/16   Darlyne Russian, MD  amoxicillin (AMOXIL) 875 MG tablet Take 1 tablet (875 mg total) by mouth 2 (two) times daily. 11/12/15  [name], MD  aspirin 81 MG tablet Take 81 mg by mouth daily.    Historical Provider, MD  clopidogrel (PLAVIX) 75 MG tablet Take 1 tablet (75 mg total) by mouth daily. 04/10/15   Darlyne Russian, MD  doxylamine, Sleep, (UNISOM) 25 MG tablet Take 1 tablet (25 mg total) by mouth at bedtime as needed. 12/10/15   Asencion Partridge Dohmeier, MD  glipiZIDE (GLUCOTROL) 5 MG tablet TAKE ONE-HALF TABLET BY MOUTH ONCE DAILY 03/22/16   Darlyne Russian, MD  HYDROcodone-acetaminophen (NORCO) 10-325 MG per tablet Take 1 tablet by mouth every 6 (six) hours as needed for severe pain.    Historical Provider, MD  lisinopril (PRINIVIL,ZESTRIL) 40 MG tablet Take 1 tablet (40 mg total) by mouth daily. 09/17/15   [name], MD  mirabegron ER (MYRBETRIQ) 50 MG TB24 tablet Take 50 mg by mouth daily.    Historical Provider, MD  pravastatin (PRAVACHOL) 40 MG tablet TAKE 1 TAB ONCE A DAY (PATIENT NEEDS OFFICE VISIT/FASTING LABS FOR ADDITIONAL  REFILLS) 04/19/16   Darlyne Russian, MD  sertraline (ZOLOFT) 25 MG tablet Take 1 tablet (25 mg total) by mouth daily. 12/10/15   Asencion Partridge Dohmeier, MD  traZODone (DESYREL) 50 MG tablet TAKE ONE-HALF TABLET BY MOUTH ONCE DAILY IN THE EVENING AS NEEDED FOR INSOMNIA 12/31/15   Darlyne Russian, MD    ROS: The patient denies fevers, chills, night sweats, unintentional weight loss, chest pain, palpitations, wheezing, dyspnea on exertion, nausea, vomiting, abdominal pain, dysuria, hematuria, melena, numbness, weakness, or tingling.  All other systems have been reviewed and were otherwise negative with the exception of those mentioned in the HPI and as above.    PHYSICAL EXAM: Filed Vitals:   05/03/16 1044  BP: 158/94  Pulse: 94  Temp: 98.1 F (36.7 C)   Body mass index is 24.29 kg/(m^2).   General: Alert, no acute distress. Cooperative. HEENT:  Normocephalic, atraumatic, oropharynx patent. Eye: Juliette Mangle [facility] Cardiovascular:  Regular rate with occasional skipping, no rubs murmurs or gallops.  No Carotid bruits, radial pulse intact. No pedal edema.  Respiratory: Clear to auscultation bilaterally.  No wheezes, rales, or rhonchi.  No cyanosis, no use of accessory musculature. Chest clear Abdominal: No organomegaly, abdomen is soft and non-tender, positive bowel sounds.  No masses. Musculoskeletal: Gait intact. No edema, tenderness Skin: No rashes. Neurologic: Facial musculature symmetric. Psychiatric: Patient acts appropriately throughout our interaction. Lymphatic: No cervical or submandibular lymphadenopathy  LABS: Results for orders placed or performed in visit on 05/03/16  POCT CBC  Result Value Ref Range   WBC 6.1 4.6 - 10.2 K/uL   Lymph, poc 1.5 0.6 - 3.4   POC LYMPH PERCENT 25.3 10 - 50 %L   MID (cbc) 0.4 0 - 0.9   POC MID % 6.0 0 - 12 %M   POC Granulocyte 4.2 2 - 6.9   Granulocyte percent 68.7 37 - 80 %G   RBC 3.71 (A) 4.04 - 5.48 M/uL   Hemoglobin 10.5 (A) 12.2 - 16.2 g/dL   HCT,  POC 30.1 (A) 37.7 - 47.9 %   MCV 81.0 80 - 97 fL   MCH, POC 28.2 27 - 31.2 pg   MCHC 34.8 31.8 - 35.4 g/dL   RDW, POC 15.3 %   Platelet Count, POC 333 142 - 424 K/uL   MPV 9.0 0 - 99.8 fL  POCT glucose (manual entry)  Result Value Ref Range   POC Glucose 95 70 - 99 mg/dl   EKG/XRAY:  Primary read interpreted by Dr. [name] at [facility].   ASSESSMENT/PLAN: Very concerned about her drop in hemoglobin. She said she had a dark stool about a week ago. She is not sure when she last had any GI evaluation by endoscopy or colonoscopy. She denies any GI symptoms. Of note she is on Plavix. Blood pressure more in the left arm than the right arm. I suspect she has some type of vascular obstruction. Overall she is doing well I did not want to make any changes.I personally performed the services described in this documentation, which was scribed in my presence. The recorded information has been reviewed and is accurate.   Gross sideeffects, risk and benefits, and alternatives of medications d/w patient. Patient is aware that all medications have potential sideeffects and we are unable to predict every sideeffect or drug-drug interaction that may occur.  Gabrielle Queen MD 05/03/2016 11:09 AM

## 2016-05-06 ENCOUNTER — Telehealth: Payer: Self-pay

## 2016-05-06 NOTE — Telephone Encounter (Signed)
Pt needs a refill on Triazolam. Meds that were sent in were the wrong ones.  Please advise  [phone]

## 2016-05-06 NOTE — Telephone Encounter (Signed)
Please advise on refill.

## 2016-05-07 NOTE — Telephone Encounter (Signed)
Medication sent in was trazodone. I do not want to keep her on the triazolam because of her age. She needs to discuss further medication changes with Dr. Brett Fairy the sleep doctor she sees. The triazolam has a warning for it for patients over 65.

## 2016-05-09 ENCOUNTER — Telehealth: Payer: Self-pay | Admitting: Neurology

## 2016-05-09 DIAGNOSIS — F5104 Psychophysiologic insomnia: Secondary | ICD-10-CM

## 2016-05-09 DIAGNOSIS — F332 Major depressive disorder, recurrent severe without psychotic features: Secondary | ICD-10-CM

## 2016-05-09 DIAGNOSIS — G4700 Insomnia, unspecified: Secondary | ICD-10-CM

## 2016-05-09 MED ORDER — SERTRALINE HCL 25 MG PO TABS: 25.0000 mg | ORAL_TABLET | Freq: Every day | ORAL | Status: DC

## 2016-05-09 NOTE — Addendum Note (Signed)
Addended by: Lester [place] A on: 05/09/2016 05:06 PM   Modules accepted: Orders

## 2016-05-09 NOTE — Telephone Encounter (Signed)
Spoke to pt and advised her that Dr. Brett Fairy refilled her zoloft and it was sent to [facility]. Dr. Brett Fairy does still recommend an insomnia counselor and pt said she is interested in that referral. Referral placed to [facility]. Pt was given their phone number and address. Pt verbalized understanding.

## 2016-05-09 NOTE — Telephone Encounter (Signed)
Gabrielle [name]  Physician:  [id number] *DOB:  [birth year] NEED TO SPEAK WITH OFFICE ABOUT MEDICATION FOR SLEEPING Y

## 2016-05-09 NOTE — Telephone Encounter (Signed)
Spoke to pt. She is going to call Dr. Brett Fairy at [facility] for her sleep medication.

## 2016-05-09 NOTE — Telephone Encounter (Signed)
Called patient no response. Left vm.

## 2016-05-09 NOTE — Telephone Encounter (Signed)
I have no trouble to refill her Zoloft and I would rather refill trazodone than triazolam. Referral to insomnia counselor is still recommended.   I ordered ZOLOFT 25 mg # 90 with 3 refills. C. Freddie Dymek  Send FYI to Dr [name].   This is the insomnia directive :   Please remember to try to maintain good sleep hygiene, which means: Keep a regular sleep and wake schedule, try not to exercise or have a meal within 2 hours of your bedtime, try to keep your bedroom conducive for sleep, that is, cool and dark, without light distractors such as an illuminated alarm clock, and refrain from watching TV right before sleep or in the middle of the night and do not keep the TV or radio on during the night. Also, try not to use or play on electronic devices at bedtime, such as your cell phone, tablet PC or laptop. If you like to read at bedtime on an electronic device, try to dim the background light as much as possible. Do not eat in the middle of the night.   We will request a sleep study.    We will look for leg twitching and snoring or sleep apnea.   For chronic insomnia, you are best followed by a psychiatrist and/or sleep psychologist.

## 2016-05-09 NOTE — Telephone Encounter (Signed)
Spoke to pt. Dr. Perfecto Kingdom office has been prescribing triazolam 0.5mg  for pt to sleep (this medication is not on her med list, so I am unsure if this is accurate), but pt reports that Dr. [name] refuses to refill the triazolam and told pt to call Dr. Brett Fairy.  Per Dr. Edwena Felty note, it seems that pt was started on zoloft 25 mg daily and melatonin for sleep, and if this doesn't help, pt needed to see an insomnia counselor and no follow up was recommended.  Pt is asking if Dr. Brett Fairy will refill zoloft or refill the triazolam. Pt is very insistent that she "needs something to sleep tonight" and " you have to call me back before 5pm because I have to have something to sleep tonight."  Do you want to refill the zoloft or triazolam?

## 2016-05-12 LAB — POC HEMOCCULT BLD/STL (HOME/3-CARD/SCREEN)
Card #2 Fecal Occult Blod, POC: NEGATIVE
Card #3 Fecal Occult Blood, POC: NEGATIVE
Fecal Occult Blood, POC: NEGATIVE

## 2016-05-12 NOTE — Addendum Note (Signed)
Addended by: Frutoso Chase A on: 05/12/2016 09:32 AM   Modules accepted: Orders

## 2016-05-31 ENCOUNTER — Telehealth: Payer: Self-pay

## 2016-05-31 NOTE — Telephone Encounter (Signed)
Patient states that the request for traizolam was denied when the pharmacy called. Patient wants to know why. Patient states she needs a refill for her sleep. She also states that we need to always call her to let her know things Pharmcay is [facility]

## 2016-06-02 MED ORDER — TRIAZOLAM 0.125 MG PO TABS: 0.1250 mg | ORAL_TABLET | Freq: Every evening | ORAL | Status: DC | PRN

## 2016-06-02 NOTE — Telephone Encounter (Signed)
Spoke with pt, she is very frustrated with her medication refill. I advised her of the precautions we have with people in her age group taking this type of medication. I also advised her that Dr. Brett Fairy sent her to a sleep counselor and she states she was not called for the appt. The referral was sent so I gave her their phone number. Pt threatened to go to the drug store and take a whole bunch of OTC pills just to sleep. I advised her that I would not recommend that but pt insisted that this is what she would do just to sleep.

## 2016-06-02 NOTE — Telephone Encounter (Signed)
[facility] and patient was scheduled for May 25 th . Patient was aware she had an apt. Patient was a NO-Show.

## 2016-06-02 NOTE — Telephone Encounter (Signed)
I called pt to advise her that our office will follow up with [place] to get her an insomnia psychologist. Pt states that she has an appt with "Dr. Ardis Hughs" for insomnia that someone made for her on July 22nd.( I am unsure if "[facility] is [facility]). She raised her voice and said "I want to have that pill Dr. [name] has been giving me for the past 10-15 years. I don't know why you won't give it to me." I asked pt which pill she was talking about? Pt says that it was the triazolam. Pt again raised her voice and said "If you won't give me that pill, I am going to the drug store and buying over the counter sleep medication and talking all of them." I told her to not do that because it was not safe and could be very harmful to her health. Pt states "Then give me that pill or else I am going to go crazy. I can't wait until July 22nd to sleep." I advised her that Dr. Brett Fairy is concerned about refilling the triazolam because of safety concerns in geriatric patients. Pt states "I don't care. Sleep is important for my age. I need sleep. If you won't give me that pill, you better give me something else. You need to ask Dr. Brett Fairy for something else for me to sleep." I advised pt that I will ask Dr. Brett Fairy for other recommendations for sleep medication that is safe for her age, but advised her that she may or may not be able to recommend anything, since Dr. Brett Fairy wanted pt to follow up with an insomnia counselor. Pt states "You better ask her anyway and call me back." Pt hung up on me at this point.  Dr. Brett Fairy, any other recommendations for this pt? She is demanding sleep medication and has threatened to take many OTC sleep meds to sleep.

## 2016-06-02 NOTE — Addendum Note (Signed)
Addended by: Lester [place] A on: 06/02/2016 03:25 PM   Modules accepted: Orders, Medications

## 2016-06-02 NOTE — Telephone Encounter (Signed)
Call patient we are not able to call in medications that are not approved or have been found to  be harmful. I will be back in office next week Monday and Tuesday morning and will be happy to discuss this with her.

## 2016-06-02 NOTE — Telephone Encounter (Signed)
The triazolam RX that was printed by Dr. Brett Fairy on 06/02/16 in error was shredded. Pt was advised to follow up with an insomnia counselor.  Hinton Dyer, can you follow up with the referral to [place]? It appears that pt has not been contacted, but I see that you have sent the referral.

## 2016-06-02 NOTE — Telephone Encounter (Signed)
dismissed

## 2016-06-02 NOTE — Telephone Encounter (Signed)
Dismissal order from Dr. Brett Fairy sent to [facility].

## 2016-06-02 NOTE — Telephone Encounter (Signed)
  Triazolam 0.125 mg has a safety concern attached for geriatric patients. That's why we asked her to seek help with a counselor for insomnia. She needs to make that appointment.

## 2016-06-03 NOTE — Telephone Encounter (Signed)
FYI Dr. [name]

## 2016-06-04 ENCOUNTER — Other Ambulatory Visit: Payer: Self-pay | Admitting: Emergency Medicine

## 2016-06-06 ENCOUNTER — Telehealth: Payer: Self-pay

## 2016-06-06 NOTE — Telephone Encounter (Signed)
Dr. [name] I know you will not prescribe this but FYI.

## 2016-06-06 NOTE — Telephone Encounter (Signed)
Dr [name], just wanted to check that it is OK to RF Plavix. You wrote this on OV notes last month, "Very concerned about her drop in hemoglobin. She said she had a dark stool about a week ago..of note, pt is on plavix". Pended RFs.

## 2016-06-06 NOTE — Telephone Encounter (Signed)
Patient stated she have appointment to see a specialist for sleeping. Her appointment is not until September 20th. Patient need a refill on her sleeping pill. Patient does not know the name of the medication. Patient stated Dr. [name] told her he will not approve the refill on the medication. [phone].

## 2016-06-07 ENCOUNTER — Encounter: Payer: Self-pay | Admitting: Neurology

## 2016-06-07 MED ORDER — MIRTAZAPINE 15 MG PO TABS: ORAL_TABLET | ORAL | Status: DC

## 2016-06-07 NOTE — Telephone Encounter (Signed)
Dr. Ardis Hughs July 25th [address] [place] Dr. Ladoris Gene 8/20 [address]  Pt is adamant about getting this medication. I keep explaining to her that the risks outweigh the benefits. Is there anything over the counter she can take. She would like to talk to Dr. [name] only.  [phone]

## 2016-06-07 NOTE — Telephone Encounter (Signed)
I cannot write for a her sleeping pill. I received a note from Dr. Brett Fairy that she dismissed her from the practice. Who is her appointment with.??

## 2016-06-07 NOTE — Telephone Encounter (Signed)
Spoke with Dr. [name], we can try remeron 15mg  1/2-1 tablet at bedtime. Adisec pt and told her that she would have to come in to be seen if this is unsuccessful. Pt understood.

## 2016-06-17 ENCOUNTER — Telehealth: Payer: Self-pay | Admitting: Neurology

## 2016-06-17 NOTE — Telephone Encounter (Signed)
Gabrielle [name] has been asked to contact the counseling center for her chronic insomnia condition- In her last 2 phone calls she demanded sleep aids/ tranquilizers and threatened to take " them all at once " just to sleep. She cannot be followed here any longer. My  dismissal letter was likely the mail she referred to.  I will send a copy FYI to her PCP.

## 2016-06-17 NOTE — Telephone Encounter (Signed)
Pt called said she rec'd a notice from the post office that she would need to sign for. Pt said she is not going to the post office to pick this up and wanted to know what it is for? Operator relayed she has been dismissed from the practice, she requested the letter be read to her. I did as requested, she said she did not understand why but said it did not matter, she was not leaving her husband at the nursing home to pick up the letter. She asked if it could be mailed regular delivery. I relayed the message would be sent to Dr Brett Fairy. She said she had been referred somewhere but has not been called to make an appointment. I told her it was [facility] and gave her the phone number. She said she would call them. She thanked me and we hung up.

## 2016-06-18 NOTE — Telephone Encounter (Signed)
I am sorry you have had to deal with this. Thank you for trying to help her

## 2016-06-20 ENCOUNTER — Other Ambulatory Visit: Payer: Self-pay | Admitting: Emergency Medicine

## 2016-06-20 ENCOUNTER — Telehealth: Payer: Self-pay | Admitting: Emergency Medicine

## 2016-06-20 MED ORDER — ATORVASTATIN CALCIUM 20 MG PO TABS: 20.0000 mg | ORAL_TABLET | Freq: Every day | ORAL | Status: DC

## 2016-06-20 NOTE — Telephone Encounter (Signed)
Pt called in requesting medication change Pravastatin to cheaper brand due to Rx plan change Verbal order per Dr. [name] to change medication to Atorvastatin 20 mg tablet daily New medication/ dose e-scribed to [place]

## 2016-06-27 ENCOUNTER — Emergency Department (HOSPITAL_COMMUNITY)
Admission: EM | Admit: 2016-06-27 | Discharge: 2016-06-28 | Disposition: A | Discharge status: Home / Self Care | Payer: Medicare Other | Attending: Emergency Medicine | Admitting: Emergency Medicine

## 2016-06-27 ENCOUNTER — Encounter (HOSPITAL_COMMUNITY): Payer: Self-pay | Admitting: Family Medicine

## 2016-06-27 DIAGNOSIS — R197 Diarrhea, unspecified: Secondary | ICD-10-CM | POA: Diagnosis not present

## 2016-06-27 DIAGNOSIS — I639 Cerebral infarction, unspecified: Secondary | ICD-10-CM | POA: Diagnosis not present

## 2016-06-27 DIAGNOSIS — M7989 Other specified soft tissue disorders: Secondary | ICD-10-CM | POA: Diagnosis not present

## 2016-06-27 DIAGNOSIS — R1010 Upper abdominal pain, unspecified: Secondary | ICD-10-CM | POA: Diagnosis present

## 2016-06-27 DIAGNOSIS — I2510 Atherosclerotic heart disease of native coronary artery without angina pectoris: Secondary | ICD-10-CM | POA: Insufficient documentation

## 2016-06-27 DIAGNOSIS — Z79899 Other long term (current) drug therapy: Secondary | ICD-10-CM | POA: Insufficient documentation

## 2016-06-27 DIAGNOSIS — Z8553 Personal history of malignant neoplasm of renal pelvis: Secondary | ICD-10-CM | POA: Insufficient documentation

## 2016-06-27 DIAGNOSIS — E119 Type 2 diabetes mellitus without complications: Secondary | ICD-10-CM | POA: Diagnosis not present

## 2016-06-27 DIAGNOSIS — E785 Hyperlipidemia, unspecified: Secondary | ICD-10-CM | POA: Diagnosis not present

## 2016-06-27 DIAGNOSIS — R112 Nausea with vomiting, unspecified: Secondary | ICD-10-CM | POA: Diagnosis not present

## 2016-06-27 DIAGNOSIS — Z87891 Personal history of nicotine dependence: Secondary | ICD-10-CM | POA: Insufficient documentation

## 2016-06-27 DIAGNOSIS — I10 Essential (primary) hypertension: Secondary | ICD-10-CM | POA: Diagnosis not present

## 2016-06-27 DIAGNOSIS — K5732 Diverticulitis of large intestine without perforation or abscess without bleeding: Secondary | ICD-10-CM | POA: Insufficient documentation

## 2016-06-27 DIAGNOSIS — M1990 Unspecified osteoarthritis, unspecified site: Secondary | ICD-10-CM | POA: Insufficient documentation

## 2016-06-27 DIAGNOSIS — N390 Urinary tract infection, site not specified: Secondary | ICD-10-CM

## 2016-06-27 DIAGNOSIS — Z7982 Long term (current) use of aspirin: Secondary | ICD-10-CM | POA: Diagnosis not present

## 2016-06-27 DIAGNOSIS — R1011 Right upper quadrant pain: Secondary | ICD-10-CM | POA: Diagnosis not present

## 2016-06-27 DIAGNOSIS — R1030 Lower abdominal pain, unspecified: Secondary | ICD-10-CM | POA: Diagnosis not present

## 2016-06-27 DIAGNOSIS — M79671 Pain in right foot: Secondary | ICD-10-CM | POA: Diagnosis not present

## 2016-06-27 MED ORDER — SODIUM CHLORIDE 0.9 % IV BOLUS (SEPSIS)
500.0000 mL | Freq: Once | INTRAVENOUS | Status: AC
  Administered 2016-06-28: 500 mL via INTRAVENOUS

## 2016-06-27 MED ORDER — ONDANSETRON HCL 4 MG/2ML IJ SOLN
4.0000 mg | Freq: Once | INTRAMUSCULAR | Status: AC
  Administered 2016-06-28: 4 mg via INTRAVENOUS
  Filled 2016-06-27: qty 2

## 2016-06-27 NOTE — ED Notes (Signed)
Patient was visiting spouse at [facility] when she started experiencing nausea and vomiting. [facility] transported patient. Also, pt complains of nausea and vomiting.

## 2016-06-27 NOTE — ED Notes (Signed)
Bed: [id number] Expected date:  Expected time:  Means of arrival:  Comments: 81 yo F abd pain, nausea, vomiting

## 2016-06-27 NOTE — ED Provider Notes (Addendum)
CSN: [id number]     Arrival date & time 06/27/16  2319 History  By signing my name below, I, Gabrielle [name], attest that this documentation has been prepared under the direction and in the presence of [name], MD. Electronically Signed: [name], ED Scribe. 06/27/2016. 11:58 PM.   Chief Complaint  Patient presents with  . Abdominal Pain  . Emesis   The history is provided by the patient. No language interpreter was used.     HPI Comments: Gabrielle [name] is a 81 y.o. femalebrought in by ambulance, with PMHx DM, essential hypertension, HLD, who presents to the Emergency Department complaining of gradual onset, constant, bilateral upper abdominal pain R>L that began at 9 PM tonight (approximately 3 hours ago). Pt also complains of nausea and vomiting. Pt denies diarrhea but states she feels like it may be starting. She has never had symptoms like this in the past. No recent sick contact but pt was visiting her spouse at a nursing facility today. No hx abdominal issues. Denies any other associated symptoms.   Pt also complains of gradual onset, intermittent, right heel pain x "awhile." She mentions that the pain is only present while ambulating. She would like her heel to be checked out while she is in the ED tonight.   Past Medical History  Diagnosis Date  . Diabetes mellitus     1990  . Essential hypertension, benign   . Hyperlipidemia   . History of anemia   . History of gout   . Claudication ([place])   . CVA (cerebral vascular accident) ([place])     [place] DIZZINESS 2008  . Azotemia   . Neck pain   . Renal cell cancer ([place]) 2001  . Seizure ([place])   . PAD (peripheral artery disease) ([facility])     hx. LCEA, hx Lt renal art. stenting, occl rt renal artery, occluded bil SFAs, moderatie iliac disease,    . Arthritis   . Carotid artery disease [facility])    Past Surgical History  Procedure Laterality Date  . Nephrectomy      RIGHT  . Ureteral stent placement      LEFT  . Carotid  endarterectomy      LEFT  . Pv angiogram  2004    Lt renal artery stent  . Nm myocar perf wall motion  07/12/2011    normal   Family History  Problem Relation Age of Onset  . Cancer Mother   . Heart disease Father    Social History  Substance Use Topics  . Smoking status: Former Smoker    Quit date: 12/26/2005  . Smokeless tobacco: Never Used  . Alcohol Use: No   OB History    No data available     Review of Systems  Constitutional: Negative for fever.  Gastrointestinal: Positive for nausea, vomiting and abdominal pain.  All other systems reviewed and are negative.  Allergies  Codeine; Coreg; Fentanyl; Hycodan; Lacosamide; Levetiracetam; Metformin and related; Metoprolol; Morphine and related; Sertraline; Tessalon; and Other  Home Medications   Prior to Admission medications   Medication Sig Start Date End Date Taking? Authorizing Provider  aspirin 81 MG tablet Take 81 mg by mouth daily.   Yes Historical Provider, MD  clopidogrel (PLAVIX) 75 MG tablet TAKE ONE TABLET BY MOUTH ONCE DAILY 06/06/16  Yes Darlyne Russian, MD  ferrous sulfate 325 (65 FE) MG tablet Take 1 tablet (325 mg total) by mouth daily with breakfast. 05/03/16  Yes Loura Back  Daub, MD  HYDROcodone-acetaminophen (NORCO) 10-325 MG per tablet Take 1 tablet by mouth every 6 (six) hours as needed for severe pain.   Yes Historical Provider, MD  lisinopril (PRINIVIL,ZESTRIL) 40 MG tablet Take 1 tablet (40 mg total) by mouth daily. 09/17/15  Yes [name], MD  mirtazapine (REMERON) 15 MG tablet Take 1/2 to 1 tablet at bedtime for sleep Patient taking differently: Take 15 mg by mouth at bedtime.  06/07/16  Yes Darlyne Russian, MD  OVER THE COUNTER MEDICATION Take 1 capsule by mouth daily. Unknown OTC medication for urinary frequency.   Yes Historical Provider, MD  pravastatin (PRAVACHOL) 40 MG tablet Take 40 mg by mouth daily.   Yes Historical Provider, MD  traZODone (DESYREL) 50 MG tablet TAKE ONE-HALF TABLET BY MOUTH  ONCE DAILY IN THE EVENING AS NEEDED FOR INSOMNIA Patient taking differently: Take 50 mg by mouth at bedtime.  05/03/16  Yes Darlyne Russian, MD  amLODipine (NORVASC) 5 MG tablet TAKE ONE TABLET BY MOUTH ONCE DAILY Patient not taking: Reported on 06/28/2016 01/08/16   Darlyne Russian, MD  atorvastatin (LIPITOR) 20 MG tablet Take 1 tablet (20 mg total) by mouth daily. Patient not taking: Reported on 06/28/2016 06/20/16   Darlyne Russian, MD  glipiZIDE (GLUCOTROL) 5 MG tablet TAKE ONE-HALF TABLET BY MOUTH ONCE DAILY Patient not taking: Reported on 06/28/2016 03/22/16   Darlyne Russian, MD  sertraline (ZOLOFT) 25 MG tablet Take 1 tablet (25 mg total) by mouth daily. Patient not taking: Reported on 06/28/2016 05/09/16   Asencion Partridge Dohmeier, MD   BP 145/64 mmHg  Pulse 74  Temp(Src) 98.2 F (36.8 C) (Oral)  Resp 16  Ht 5\' 1"  (1.549 m)  Wt 120 lb (54.432 kg)  BMI 22.69 kg/m2  SpO2 98%   Physical Exam  Constitutional: She is oriented to person, place, and time. She appears well-developed and well-nourished. No distress.  HENT:  Head: Normocephalic and atraumatic.  Right Ear: Hearing normal.  Left Ear: Hearing normal.  Nose: Nose normal.  Mouth/Throat: Oropharynx is clear and moist and mucous membranes are normal.  Eyes: Conjunctivae and EOM are normal. Pupils are equal, round, and reactive to light.  Neck: Normal range of motion. Neck supple.  Cardiovascular: Regular rhythm, S1 normal and S2 normal.  Exam reveals no gallop and no friction rub.   No murmur heard. Pulmonary/Chest: Effort normal and breath sounds normal. No respiratory distress. She exhibits no tenderness.  Abdominal: Soft. Normal appearance and bowel sounds are normal. There is no hepatosplenomegaly. There is tenderness. There is no rebound, no guarding, no tenderness at McBurney's point and negative Murphy's sign. No hernia.  Diffuse abdominal tenderness without guarding or rebound.   Musculoskeletal: Normal range of motion. She exhibits tenderness.   Tenderness over the plantar aspect of the calcaneal region of right foot.   Neurological: She is alert and oriented to person, place, and time. She has normal strength. No cranial nerve deficit or sensory deficit. Coordination normal. GCS eye subscore is 4. GCS verbal subscore is 5. GCS motor subscore is 6.  Skin: Skin is warm, dry and intact. No rash noted. No cyanosis.  Psychiatric: She has a normal mood and affect. Her speech is normal and behavior is normal. Thought content normal.  Nursing note and vitals reviewed.   ED Course  Procedures (including critical care time)  DIAGNOSTIC STUDIES: Oxygen Saturation is 97% on RA, normal by my interpretation.    COORDINATION OF CARE: 11:55 PM-Discussed treatment plan  which include DG Abdominal series and DG R Foot with pt at bedside and pt agreed to plan.   Labs Review Labs Reviewed  CBC WITH DIFFERENTIAL/PLATELET - Abnormal; Notable for the following:    WBC 11.2 (*)    RBC 3.60 (*)    Hemoglobin 9.9 (*)    HCT 30.4 (*)    RDW 15.6 (*)    Neutro Abs 9.5 (*)    All other components within normal limits  COMPREHENSIVE METABOLIC PANEL - Abnormal; Notable for the following:    Glucose, Bld 157 (*)    BUN 25 (*)    Creatinine, Ser 1.90 (*)    Calcium 8.4 (*)    Total Protein 6.3 (*)    AST 13 (*)    ALT 9 (*)    GFR calc non Af Amer 24 (*)    GFR calc Af Amer 28 (*)    All other components within normal limits  URINALYSIS, ROUTINE W REFLEX MICROSCOPIC (NOT AT [facility]) - Abnormal; Notable for the following:    APPearance CLOUDY (*)    Hgb urine dipstick TRACE (*)    Protein, ur 100 (*)    Nitrite POSITIVE (*)    Leukocytes, UA MODERATE (*)    All other components within normal limits  URINE MICROSCOPIC-ADD ON - Abnormal; Notable for the following:    Squamous Epithelial / LPF 0-5 (*)    Bacteria, UA MANY (*)    All other components within normal limits  URINE CULTURE  LIPASE, BLOOD    Imaging Review Ct Abdomen Pelvis Wo  Contrast  06/28/2016  CLINICAL DATA:  Acute onset of upper abdominal pain, nausea and vomiting. Leukocytosis. Initial encounter. EXAM: CT ABDOMEN AND PELVIS WITHOUT CONTRAST TECHNIQUE: Multidetector CT imaging of the abdomen and pelvis was performed following the standard protocol without IV contrast. COMPARISON:  Abdominal ultrasound performed 01/02/2015 FINDINGS: Mild emphysema and scarring are noted at the lung bases. Scattered coronary artery calcifications are seen. The liver and spleen are unremarkable in appearance. The gallbladder is within normal limits. The pancreas and adrenal glands are unremarkable. The patient is status post right-sided nephrectomy. A small left renal cyst is noted, measuring 1.5 cm. There is no evidence of hydronephrosis. No renal or ureteral stones are seen. No perinephric stranding is appreciated. No free fluid is identified. The small bowel is unremarkable in appearance. The stomach is within normal limits. No acute vascular abnormalities are seen. Diffuse calcification is seen along the abdominal aorta and its branches, with likely severe luminal narrowing along the distal abdominal aorta and common iliac arteries bilaterally. The appendix is not definitely characterized; there is no evidence for appendicitis. Slight inflammation about the distal descending colon could reflect a mild infectious or inflammatory process. The colon is otherwise unremarkable. The bladder is mildly distended and grossly unremarkable. The patient is status post hysterectomy. No suspicious adnexal masses are seen. The ovaries are relatively symmetric. No inguinal lymphadenopathy is seen. No acute osseous abnormalities are identified. Vacuum phenomenon is noted at L5-S1. Underlying facet disease is noted. IMPRESSION: 1. Slight inflammation about the distal descending colon could reflect a mild infectious or inflammatory process. 2. Mild emphysema and scarring at the lung bases. 3. Scattered coronary  artery calcifications seen. 4. Small left renal cyst noted. 5. Diffuse calcification along the abdominal aorta and its branches, with likely severe luminal narrowing along the distal abdominal aorta and common iliac arteries bilaterally. Electronically Signed   By: Garald Balding M.D.   On:  06/28/2016 05:41   Dg Abd Acute W/chest  06/28/2016  CLINICAL DATA:  Acute onset of lower abdominal pain, nausea, vomiting and diarrhea. Initial encounter. EXAM: DG ABDOMEN ACUTE W/ 1V CHEST COMPARISON:  Chest and abdominal radiographs performed 12/23/2014 FINDINGS: The lungs are well-aerated and clear. There is no evidence of focal opacification, pleural effusion or pneumothorax. The cardiomediastinal silhouette is within normal limits. The visualized bowel gas pattern is unremarkable. Scattered stool and air are seen within the colon; there is no evidence of small bowel dilatation to suggest obstruction. No free intra-abdominal air is identified on the provided upright view. Scattered clips are noted overlying the right side of the abdomen. No acute osseous abnormalities are seen; the sacroiliac joints are unremarkable in appearance. IMPRESSION: 1. Unremarkable bowel gas pattern; no free intra-abdominal air seen. Small amount of stool noted in the colon. 2. No acute cardiopulmonary process seen. Electronically Signed   By: Garald Balding M.D.   On: 06/28/2016 01:02   Dg Foot Complete Right  06/28/2016  CLINICAL DATA:  Chronic right heel pain and numbness, with swelling. Initial encounter. EXAM: RIGHT FOOT COMPLETE - 3+ VIEW COMPARISON:  None. FINDINGS: There is no evidence of fracture or dislocation. The joint spaces are preserved. There is no evidence of talar subluxation; the subtalar joint is unremarkable in appearance. Small plantar and posterior calcaneal spurs are seen. No significant soft tissue abnormalities are seen. IMPRESSION: No evidence of fracture or dislocation. Electronically Signed   By: Garald Balding M.D.    On: 06/28/2016 01:04   I have personally reviewed and evaluated these images and lab results as part of my medical decision-making.   EKG Interpretation None      MDM   Final diagnoses:  Nausea and vomiting, vomiting of unspecified type  UTI (lower urinary tract infection)   Patient presented to the ER for evaluation of nausea and vomiting with abdominal pain. She reports that she had been visiting her husband at the nursing home where he resides all day. This evening just before she was about to go home, however, she became acutely nauseated and vomited. She has had mild diffuse mid and lower abdominal cramping. Vital signs are unremarkable. Lab work revealed slight elevation of white blood cell count. Urinalysis shows obvious infection. This was treated with IV Rocephin.   CT scan was performed to further evaluate abdominal pain. Possible inflammation of the descending colon is noted. Based on this, antibiotic will be changed to Cipro and Flagyl. Her last Legrand Como did reveal Escherichia coli that was sensitive to cephalosporin. Culture has been performed and pending.  Patient was complaining of pain in the area of her right heel. There has not been any injury. X-ray was unremarkable. No overlying skin changes.  I personally performed the services described in this documentation, which was scribed in my presence. The recorded information has been reviewed and is accurate.      [name], MD 06/28/16 [id number]  [name], MD 06/28/16 [phone]

## 2016-06-28 ENCOUNTER — Emergency Department (HOSPITAL_COMMUNITY): Payer: Medicare Other

## 2016-06-28 ENCOUNTER — Other Ambulatory Visit (HOSPITAL_COMMUNITY): Payer: Medicare Other

## 2016-06-28 DIAGNOSIS — M7989 Other specified soft tissue disorders: Secondary | ICD-10-CM | POA: Diagnosis not present

## 2016-06-28 DIAGNOSIS — N390 Urinary tract infection, site not specified: Secondary | ICD-10-CM | POA: Diagnosis not present

## 2016-06-28 DIAGNOSIS — R197 Diarrhea, unspecified: Secondary | ICD-10-CM | POA: Diagnosis not present

## 2016-06-28 DIAGNOSIS — M79671 Pain in right foot: Secondary | ICD-10-CM | POA: Diagnosis not present

## 2016-06-28 DIAGNOSIS — R1011 Right upper quadrant pain: Secondary | ICD-10-CM | POA: Diagnosis not present

## 2016-06-28 DIAGNOSIS — R112 Nausea with vomiting, unspecified: Secondary | ICD-10-CM | POA: Diagnosis not present

## 2016-06-28 DIAGNOSIS — R1030 Lower abdominal pain, unspecified: Secondary | ICD-10-CM | POA: Diagnosis not present

## 2016-06-28 LAB — CBC WITH DIFFERENTIAL/PLATELET
Basophils Absolute: 0 10*3/uL (ref 0.0–0.1)
Basophils Relative: 0 %
Eosinophils Absolute: 0.1 10*3/uL (ref 0.0–0.7)
Eosinophils Relative: 1 %
HCT: 30.4 % — ABNORMAL LOW (ref 36.0–46.0)
Hemoglobin: 9.9 g/dL — ABNORMAL LOW (ref 12.0–15.0)
Lymphocytes Relative: 9 %
Lymphs Abs: 1 10*3/uL (ref 0.7–4.0)
MCH: 27.5 pg (ref 26.0–34.0)
MCHC: 32.6 g/dL (ref 30.0–36.0)
MCV: 84.4 fL (ref 78.0–100.0)
Monocytes Absolute: 0.6 10*3/uL (ref 0.1–1.0)
Monocytes Relative: 5 %
Neutro Abs: 9.5 10*3/uL — ABNORMAL HIGH (ref 1.7–7.7)
Neutrophils Relative %: 85 %
Platelets: 254 10*3/uL (ref 150–400)
RBC: 3.6 MIL/uL — ABNORMAL LOW (ref 3.87–5.11)
RDW: 15.6 % — ABNORMAL HIGH (ref 11.5–15.5)
WBC: 11.2 10*3/uL — ABNORMAL HIGH (ref 4.0–10.5)

## 2016-06-28 LAB — URINALYSIS, ROUTINE W REFLEX MICROSCOPIC
Bilirubin Urine: NEGATIVE
Glucose, UA: NEGATIVE mg/dL
Ketones, ur: NEGATIVE mg/dL
Nitrite: POSITIVE — AB
Protein, ur: 100 mg/dL — AB
Specific Gravity, Urine: 1.018 (ref 1.005–1.030)
pH: 6.5 (ref 5.0–8.0)

## 2016-06-28 LAB — COMPREHENSIVE METABOLIC PANEL
ALT: 9 U/L — ABNORMAL LOW (ref 14–54)
AST: 13 U/L — ABNORMAL LOW (ref 15–41)
Albumin: 3.6 g/dL (ref 3.5–5.0)
Alkaline Phosphatase: 46 U/L (ref 38–126)
Anion gap: 9 (ref 5–15)
BUN: 25 mg/dL — ABNORMAL HIGH (ref 6–20)
CO2: 25 mmol/L (ref 22–32)
Calcium: 8.4 mg/dL — ABNORMAL LOW (ref 8.9–10.3)
Chloride: 105 mmol/L (ref 101–111)
Creatinine, Ser: 1.9 mg/dL — ABNORMAL HIGH (ref 0.44–1.00)
GFR calc Af Amer: 28 mL/min — ABNORMAL LOW (ref >60)
GFR calc non Af Amer: 24 mL/min — ABNORMAL LOW (ref >60)
Glucose, Bld: 157 mg/dL — ABNORMAL HIGH (ref 65–99)
Potassium: 3.9 mmol/L (ref 3.5–5.1)
Sodium: 139 mmol/L (ref 135–145)
Total Bilirubin: 0.4 mg/dL (ref 0.3–1.2)
Total Protein: 6.3 g/dL — ABNORMAL LOW (ref 6.5–8.1)

## 2016-06-28 LAB — URINE MICROSCOPIC-ADD ON

## 2016-06-28 LAB — LIPASE, BLOOD: Lipase: 33 U/L (ref 11–51)

## 2016-06-28 MED ORDER — DIATRIZOATE MEGLUMINE & SODIUM 66-10 % PO SOLN
15.0000 mL | Freq: Once | ORAL | Status: AC
  Administered 2016-06-28: 15 mL via ORAL

## 2016-06-28 MED ORDER — DIPHENOXYLATE-ATROPINE 2.5-0.025 MG PO TABS
2.0000 | ORAL_TABLET | Freq: Once | ORAL | Status: AC
  Administered 2016-06-28: 2 via ORAL
  Filled 2016-06-28: qty 2

## 2016-06-28 MED ORDER — ONDANSETRON HCL 4 MG PO TABS: 4.0000 mg | ORAL_TABLET | Freq: Four times a day (QID) | ORAL | Status: DC

## 2016-06-28 MED ORDER — DEXTROSE 5 % IV SOLN
1.0000 g | Freq: Once | INTRAVENOUS | Status: AC
  Administered 2016-06-28: 1 g via INTRAVENOUS
  Filled 2016-06-28: qty 10

## 2016-06-28 MED ORDER — CIPROFLOXACIN HCL 500 MG PO TABS
500.0000 mg | ORAL_TABLET | Freq: Two times a day (BID) | ORAL | Status: DC
Start: 2016-06-28 — End: 2016-07-08

## 2016-06-28 MED ORDER — METRONIDAZOLE 500 MG PO TABS: 500.0000 mg | ORAL_TABLET | Freq: Three times a day (TID) | ORAL | Status: DC

## 2016-06-28 NOTE — Discharge Instructions (Signed)
Diverticulitis Diverticulitis is when small pockets that have formed in your colon (large intestine) become infected or swollen. HOME CARE  Follow your doctor's instructions.  Follow a special diet if told by your doctor.  When you feel better, your doctor may tell you to change your diet. You may be told to eat a lot of fiber. Fruits and vegetables are good sources of fiber. Fiber makes it easier to poop (have bowel movements).  Take supplements or probiotics as told by your doctor.  Only take medicines as told by your doctor.  Keep all follow-up visits with your doctor. GET HELP IF:  Your pain does not get better.  You have a hard time eating food.  You are not pooping like normal. GET HELP RIGHT AWAY IF:  Your pain gets worse.  Your problems do not get better.  Your problems suddenly get worse.  You have a fever.  You keep throwing up (vomiting).  You have bloody or black, tarry poop (stool). MAKE SURE YOU:   Understand these instructions.  Will watch your condition.  Will get help right away if you are not doing well or get worse.   This information is not intended to replace advice given to you by your health care provider. Make sure you discuss any questions you have with your health care provider.   Document Released: 05/30/2008 Document Revised: 12/17/2013 Document Reviewed: 11/06/2013 Elsevier Interactive Patient Education 2016 Elsevier Inc.  Urinary Tract Infection A urinary tract infection (UTI) can occur any place along the urinary tract. The tract includes the kidneys, ureters, bladder, and urethra. A type of germ called bacteria often causes a UTI. UTIs are often helped with antibiotic medicine.  HOME CARE   If given, take antibiotics as told by your doctor. Finish them even if you start to feel better.  Drink enough fluids to keep your pee (urine) clear or pale yellow.  Avoid tea, drinks with caffeine, and bubbly (carbonated) drinks.  Pee  often. Avoid holding your pee in for a long time.  Pee before and after having sex (intercourse).  Wipe from front to back after you poop (bowel movement) if you are a woman. Use each tissue only once. GET HELP RIGHT AWAY IF:   You have back pain.  You have lower belly (abdominal) pain.  You have chills.  You feel sick to your stomach (nauseous).  You throw up (vomit).  Your burning or discomfort with peeing does not go away.  You have a fever.  Your symptoms are not better in 3 days. MAKE SURE YOU:   Understand these instructions.  Will watch your condition.  Will get help right away if you are not doing well or get worse.   This information is not intended to replace advice given to you by your health care provider. Make sure you discuss any questions you have with your health care provider.   Document Released: 05/30/2008 Document Revised: 01/02/2015 Document Reviewed: 07/12/2012 Elsevier Interactive Patient Education [employer].

## 2016-06-28 NOTE — ED Notes (Signed)
Patient transported to X-ray 

## 2016-06-28 NOTE — ED Notes (Addendum)
Pt not able to give enough urine for a sample

## 2016-07-01 LAB — URINE CULTURE: Culture: >=100000 — AB

## 2016-07-02 ENCOUNTER — Telehealth (HOSPITAL_BASED_OUTPATIENT_CLINIC_OR_DEPARTMENT_OTHER): Payer: Self-pay

## 2016-07-02 NOTE — Telephone Encounter (Signed)
Post ED Visit - Positive Culture Follow-up  Culture report reviewed by antimicrobial stewardship pharmacist:  []  Elenor Quinones, Pharm.D. []  Heide Guile, Pharm.D., BCPS []  Parks Neptune, Pharm.D. []  Alycia Rossetti, Pharm.D., BCPS []  [place], [place].D., BCPS, AAHIVP []  Legrand Como, Pharm.D., BCPS, AAHIVP []  Milus Glazier, Pharm.D. []  [place], [place].DTiney Rouge Pharm D Positive urine culture Treated with Ciprofloxacin, organism sensitive to the same and no further patient follow-up is required at this time.  Genia Del 07/02/2016, 10:52 AM

## 2016-07-06 ENCOUNTER — Telehealth: Payer: Self-pay

## 2016-07-06 NOTE — Telephone Encounter (Signed)
Patient is calling to request a refill for trazodone. [place] is [employer]

## 2016-07-08 ENCOUNTER — Ambulatory Visit (INDEPENDENT_AMBULATORY_CARE_PROVIDER_SITE_OTHER): Payer: Medicare Other | Admitting: Emergency Medicine

## 2016-07-08 VITALS — BP 128/70 | HR 85 | Temp 98.0°F | Resp 17 | Ht 61.0 in | Wt 130.0 lb

## 2016-07-08 DIAGNOSIS — G4700 Insomnia, unspecified: Secondary | ICD-10-CM | POA: Diagnosis not present

## 2016-07-08 DIAGNOSIS — R112 Nausea with vomiting, unspecified: Secondary | ICD-10-CM | POA: Diagnosis not present

## 2016-07-08 DIAGNOSIS — F329 Major depressive disorder, single episode, unspecified: Secondary | ICD-10-CM | POA: Diagnosis not present

## 2016-07-08 DIAGNOSIS — N390 Urinary tract infection, site not specified: Secondary | ICD-10-CM

## 2016-07-08 DIAGNOSIS — R809 Proteinuria, unspecified: Secondary | ICD-10-CM

## 2016-07-08 DIAGNOSIS — K5732 Diverticulitis of large intestine without perforation or abscess without bleeding: Secondary | ICD-10-CM

## 2016-07-08 DIAGNOSIS — D509 Iron deficiency anemia, unspecified: Secondary | ICD-10-CM | POA: Diagnosis not present

## 2016-07-08 DIAGNOSIS — F32A Depression, unspecified: Secondary | ICD-10-CM

## 2016-07-08 LAB — POCT CBC
Granulocyte percent: 84.6 %G — AB (ref 37–80)
HCT, POC: 34.5 % — AB (ref 37.7–47.9)
Hemoglobin: 12.1 g/dL — AB (ref 12.2–16.2)
Lymph, poc: 0.8 (ref 0.6–3.4)
MCH, POC: 28.9 pg (ref 27–31.2)
MCHC: 35.1 g/dL (ref 31.8–35.4)
MCV: 82.3 fL (ref 80–97)
MID (cbc): 0.5 (ref 0–0.9)
MPV: 9 fL (ref 0–99.8)
POC Granulocyte: 7.1 — AB (ref 2–6.9)
POC LYMPH PERCENT: 9.7 %L — AB (ref 10–50)
POC MID %: 5.7 %M (ref 0–12)
Platelet Count, POC: 327 10*3/uL (ref 142–424)
RBC: 4.2 M/uL (ref 4.04–5.48)
RDW, POC: 17.7 %
WBC: 8.4 10*3/uL (ref 4.6–10.2)

## 2016-07-08 LAB — BASIC METABOLIC PANEL WITH GFR
BUN: 11 mg/dL (ref 7–25)
CO2: 25 mmol/L (ref 20–31)
Calcium: 9 mg/dL (ref 8.6–10.4)
Chloride: 106 mmol/L (ref 98–110)
Creat: 1.75 mg/dL — ABNORMAL HIGH (ref 0.60–0.93)
GFR, Est African American: 32 mL/min — ABNORMAL LOW (ref >=60)
GFR, Est Non African American: 27 mL/min — ABNORMAL LOW (ref >=60)
Glucose, Bld: 154 mg/dL — ABNORMAL HIGH (ref 65–99)
Potassium: 4.2 mmol/L (ref 3.5–5.3)
Sodium: 141 mmol/L (ref 135–146)

## 2016-07-08 LAB — POCT URINALYSIS DIP (MANUAL ENTRY)
Bilirubin, UA: NEGATIVE
Glucose, UA: NEGATIVE
Leukocytes, UA: NEGATIVE
Nitrite, UA: NEGATIVE
Protein Ur, POC: >=300 — AB
Spec Grav, UA: >=1.03
Urobilinogen, UA: 0.2
pH, UA: 5

## 2016-07-08 LAB — POC MICROSCOPIC URINALYSIS (UMFC)

## 2016-07-08 MED ORDER — TRAZODONE HCL 50 MG PO TABS: 50.0000 mg | ORAL_TABLET | Freq: Every day | ORAL | Status: DC

## 2016-07-08 MED ORDER — MIRTAZAPINE 15 MG PO TABS: 15.0000 mg | ORAL_TABLET | Freq: Every day | ORAL | Status: DC

## 2016-07-08 NOTE — Patient Instructions (Signed)
     IF you received an x-ray today, you will receive an invoice from [facility]. Please contact [facility] at [phone] with questions or concerns regarding your invoice.   IF you received labwork today, you will receive an invoice from [facility]. Please contact [facility] at [phone] with questions or concerns regarding your invoice.   Our billing staff will not be able to assist you with questions regarding bills from these companies.  You will be contacted with the lab results as soon as they are available. The fastest way to get your results is to activate your My Chart account. Instructions are located on the last page of this paperwork. If you have not heard from us regarding the results in 2 weeks, please contact this office.      

## 2016-07-08 NOTE — Progress Notes (Signed)
By signing my name below, I, [name], attest that this documentation has been prepared under the direction and in the presence of [name], MD.  Electronically Signed: [name], Medical Scribe. 07/08/2016. 11:49 AM.  Chief Complaint:  Chief Complaint  Patient presents with  . Medication Refill    TRAZODONE/REMERON    HPI: Gabrielle [name] is a 81 y.o. female who reports to [facility] today for medication refill. Pt was seen in the ER July 4th. She had an elevated white count of 11,200 and a hemoglobin of 9.9. Pt was dx with UTI and colitis- treated with Cipro and Flagyl Pt is now taking a whole pill of both Remeron and Trazodone. Pt reports diarrhea. Pt states the skin all over her body looks and feels dry. Pt was throwing up last Monday (4 days ago) and was sent to the hospital for treatment. Pt was told she had a UTI. Pt's symptoms have subsided since then. Pt states she no longer takes Cipro and Flagyl.  Pt states [name] has been doing better.  Past Medical History  Diagnosis Date  . Diabetes mellitus     1990  . Essential hypertension, benign   . Hyperlipidemia   . History of anemia   . History of gout   . Claudication ([place])   . CVA (cerebral vascular accident) ([place])     [place] DIZZINESS 2008  . Azotemia   . Neck pain   . Renal cell cancer ([place]) 2001  . Seizure ([place])   . PAD (peripheral artery disease) (HCC)     hx. LCEA, hx Lt renal art. stenting, occl rt renal artery, occluded bil SFAs, moderatie iliac disease,    . Arthritis   . Carotid artery disease [facility])    Past Surgical History  Procedure Laterality Date  . Nephrectomy      RIGHT  . Ureteral stent placement      LEFT  . Carotid endarterectomy      LEFT  . Pv angiogram  2004    Lt renal artery stent  . Nm myocar perf wall motion  07/12/2011    normal   Social History   Social History  . Marital Status: Married    Spouse Name: N/A  . Number of Children: 2  . Years of Education: N/A    Occupational History  . Retired    Social History Main Topics  . Smoking status: Former Smoker    Quit date: 12/26/2005  . Smokeless tobacco: Never Used  . Alcohol Use: No  . Drug Use: No  . Sexual Activity: No   Other Topics Concern  . None   Social History Narrative   Family History  Problem Relation Age of Onset  . Cancer Mother   . Heart disease Father    Allergies  Allergen Reactions  . Codeine Nausea And Vomiting  . Coreg [Carvedilol] Nausea And Vomiting  . Fentanyl   . Hycodan [Hydrocodone-Homatropine] Itching  . Lacosamide      Other name is, VIMPAT  . Levetiracetam Other (See Comments)    Strange feelings in head  . Metformin And Related   . Metoprolol   . Morphine And Related Itching  . Sertraline Nausea Only and Other (See Comments)    Swelling in mouth  . Tessalon [Benzonatate]   . Other Rash    BETA BLOCKER   Prior to Admission medications   Medication Sig Start Date End Date Taking? Authorizing Provider  amLODipine (NORVASC) 5 MG tablet  TAKE ONE TABLET BY MOUTH ONCE DAILY 01/08/16  Yes Darlyne Russian, MD  aspirin 81 MG tablet Take 81 mg by mouth daily.   Yes Historical Provider, MD  atorvastatin (LIPITOR) 20 MG tablet Take 1 tablet (20 mg total) by mouth daily. 06/20/16  Yes Darlyne Russian, MD  clopidogrel (PLAVIX) 75 MG tablet TAKE ONE TABLET BY MOUTH ONCE DAILY 06/06/16  Yes Darlyne Russian, MD  ferrous sulfate 325 (65 FE) MG tablet Take 1 tablet (325 mg total) by mouth daily with breakfast. 05/03/16  Yes Darlyne Russian, MD  HYDROcodone-acetaminophen (NORCO) 10-325 MG per tablet Take 1 tablet by mouth every 6 (six) hours as needed for severe pain.   Yes Historical Provider, MD  lisinopril (PRINIVIL,ZESTRIL) 40 MG tablet Take 1 tablet (40 mg total) by mouth daily. 09/17/15  Yes [name], MD  mirtazapine (REMERON) 15 MG tablet Take 1/2 to 1 tablet at bedtime for sleep Patient taking differently: Take 15 mg by mouth at bedtime.  06/07/16  Yes Darlyne Russian, MD  OVER THE COUNTER MEDICATION Take 1 capsule by mouth daily. Unknown OTC medication for urinary frequency.   Yes Historical Provider, MD  pravastatin (PRAVACHOL) 40 MG tablet Take 40 mg by mouth daily.   Yes Historical Provider, MD  traZODone (DESYREL) 50 MG tablet TAKE ONE-HALF TABLET BY MOUTH ONCE DAILY IN THE EVENING AS NEEDED FOR INSOMNIA Patient taking differently: Take 50 mg by mouth at bedtime.  05/03/16  Yes Darlyne Russian, MD  glipiZIDE (GLUCOTROL) 5 MG tablet TAKE ONE-HALF TABLET BY MOUTH ONCE DAILY Patient not taking: Reported on 07/08/2016 03/22/16   Darlyne Russian, MD  ondansetron (ZOFRAN) 4 MG tablet Take 1 tablet (4 mg total) by mouth every 6 (six) hours. Patient not taking: Reported on 07/08/2016 06/28/16   [name], MD  sertraline (ZOLOFT) 25 MG tablet Take 1 tablet (25 mg total) by mouth daily. Patient not taking: Reported on 07/08/2016 05/09/16   Larey Seat, MD     ROS: The patient denies fevers, chills, night sweats, unintentional weight loss, chest pain, palpitations, wheezing, dyspnea on exertion, nausea, vomiting, abdominal pain, dysuria, hematuria, melena, numbness, weakness, or tingling.  All other systems have been reviewed and were otherwise negative with the exception of those mentioned in the HPI and as above.    PHYSICAL EXAM: Filed Vitals:   07/08/16 1114  BP: 128/70  Pulse: 85  Temp: 98 F (36.7 C)  Resp: 17   Body mass index is 24.58 kg/(m^2).   General: Alert, no acute distress HEENT:  Normocephalic, atraumatic, oropharynx patent. Eye: [facility] Cardiovascular:  Regular rate and rhythm, no rubs murmurs or gallops.  No Carotid bruits, radial pulse intact. No pedal edema.  Respiratory: Clear to auscultation bilaterally.  No wheezes, rales, or rhonchi.  No cyanosis, no use of accessory musculature Abdominal: No organomegaly, abdomen is soft and non-tender, positive bowel sounds.  No masses. Musculoskeletal: Gait intact. No edema,  tenderness Skin: No rashes. Neurologic: Facial musculature symmetric. Psychiatric: Patient acts appropriately throughout our interaction. Lymphatic: No cervical or submandibular lymphadenopathy   LABS: Results for orders placed or performed in visit on 07/08/16  POCT CBC  Result Value Ref Range   WBC 8.4 4.6 - 10.2 K/uL   Lymph, poc 0.8 0.6 - 3.4   POC LYMPH PERCENT 9.7 (A) 10 - 50 %L   MID (cbc) 0.5 0 - 0.9   POC MID % 5.7 0 - 12 %M  POC Granulocyte 7.1 (A) 2 - 6.9   Granulocyte percent 84.6 (A) 37 - 80 %G   RBC 4.20 4.04 - 5.48 M/uL   Hemoglobin 12.1 (A) 12.2 - 16.2 g/dL   HCT, POC 34.5 (A) 37.7 - 47.9 %   MCV 82.3 80 - 97 fL   MCH, POC 28.9 27 - 31.2 pg   MCHC 35.1 31.8 - 35.4 g/dL   RDW, POC 17.7 %   Platelet Count, POC 327 142 - 424 K/uL   MPV 9.0 0 - 99.8 fL  POCT urinalysis dipstick  Result Value Ref Range   Color, UA yellow yellow   Clarity, UA clear clear   Glucose, UA negative negative   Bilirubin, UA negative negative   Ketones, POC UA trace (5) (A) negative   Spec Grav, UA >=1.030    Blood, UA small (A) negative   pH, UA 5.0    Protein Ur, POC >=300 (A) negative   Urobilinogen, UA 0.2    Nitrite, UA Negative Negative   Leukocytes, UA Negative Negative  POCT Microscopic Urinalysis ([facility])  Result Value Ref Range   WBC,UR,HPF,POC Few (A) None WBC/hpf   RBC,UR,HPF,POC Few (A) None RBC/hpf   Bacteria Moderate (A) None, Too numerous to count   Mucus Present (A) Absent   Epithelial Cells, UR Per Microscopy Moderate (A) None, Too numerous to count cells/hpf   EKG/XRAY:   Primary read interpreted by Dr. [name] at [facility].   ASSESSMENT/PLAN: Urine had a few red and white cells and culture was repeated. She looks good with abdominal pain. I did go ahead and refill her Remeron and trazodone she takes for sleep at night.I personally performed the services described in this documentation, which was scribed in my presence. The recorded information has been reviewed and  is accurate.. She has follow-up appointments with Dr. Earlean Shawl and with Dr. Prudy Feeler. She did have significant proteinuria on her urinalysis. She only has one kidney. Referral made back to Dr. Posey Pronto. Basic metabolic panel was also done.   Gross sideeffects, risk and benefits, and alternatives of medications d/w patient. Patient is aware that all medications have potential sideeffects and we are unable to predict every sideeffect or drug-drug interaction that may occur.  [name] MD 07/08/2016 11:49 AM

## 2016-07-09 LAB — URINE CULTURE: Colony Count: 50000

## 2016-07-12 NOTE — Telephone Encounter (Signed)
Done at office visit.

## 2016-07-19 ENCOUNTER — Telehealth: Payer: Self-pay

## 2016-07-19 ENCOUNTER — Encounter: Payer: Self-pay | Admitting: Gastroenterology

## 2016-07-19 ENCOUNTER — Ambulatory Visit (INDEPENDENT_AMBULATORY_CARE_PROVIDER_SITE_OTHER): Payer: Medicare Other | Admitting: Gastroenterology

## 2016-07-19 VITALS — BP 134/70 | HR 76 | Ht 61.0 in | Wt 133.6 lb

## 2016-07-19 DIAGNOSIS — D649 Anemia, unspecified: Secondary | ICD-10-CM

## 2016-07-19 DIAGNOSIS — R9389 Abnormal findings on diagnostic imaging of other specified body structures: Secondary | ICD-10-CM

## 2016-07-19 DIAGNOSIS — R938 Abnormal findings on diagnostic imaging of other specified body structures: Secondary | ICD-10-CM | POA: Diagnosis not present

## 2016-07-19 MED ORDER — NA SULFATE-K SULFATE-MG SULF 17.5-3.13-1.6 GM/177ML PO SOLN: 1.0000 | Freq: Once | ORAL | 0 refills | Status: AC

## 2016-07-19 NOTE — Patient Instructions (Addendum)
You will be set up for a colonoscopy for anemia, abnormal CT scan. We will communicate with Dr. [name] about you holding the plavix for 5 days prior to the colonoscopy.

## 2016-07-19 NOTE — Telephone Encounter (Signed)
07/19/2016   RE: Gabrielle [name] DOB: [birth year] MRN: [id number]   Dear Dr [name],    We have scheduled the above patient for an endoscopic procedure. Our records show that she is on anticoagulation therapy.   Please advise as to how long the patient may come off her therapy of plavix prior to the procedure, which is scheduled for 08/10/16.  Please fax back/ or route the completed form to Linus Weckerly at [phone].   Sincerely,    Christian Mate RN

## 2016-07-19 NOTE — Progress Notes (Signed)
HPI: This is a very pleasant 81 year old woman who said she received something in the mail about coming to see me for this appointment.  Chief complaint is anemia, abnormal CT scan of colon  She is not really sure she is here right now but looking through her electronic medical records I find that she had recent CT scan that showed a slightly abnormal col also she has chronic anemia. I actually met her in the office here about 2 years ago were planning to do colonoscopy. She is not sure why she never had that done. That was for mild anemia as well.   Fecal occult blood testing was negative times 04/27/2016.  CBC 06/2016 show Hb 9.9 during ER visit (for vomiting, abd pains)  CT scan 06/2016 (ER visit) IMPRESSION: 1. Slight inflammation about the distal descending colon could reflect a mild infectious or inflammatory process. 2. Mild emphysema and scarring at the lung bases. 3. Scattered coronary artery calcifications seen. 4. Small left renal cyst noted. 5. Diffuse calcification along the abdominal aorta and its branches, with likely severe luminal narrowing along the distal abdominal aorta and common iliac arteries bilaterally.  She was given cipro/flagyl orally.   2 years ago I saw her in the office for anemia. Her hemoglobin at that time was 10.2. She had never had a colonoscopy or other colon cancer screening and so I recommended at that time that she have a colonoscopy. That never happened, I am not sure why.  She's been gaining weight.  No overt blood in her stool.  No colon cancer in her family.    Past Medical History:  Diagnosis Date  . Arthritis   . Azotemia   . Carotid artery disease ([place])   . Claudication ([place])   . CVA (cerebral vascular accident) ([place])    [place] DIZZINESS 2008  . Diabetes mellitus    1990  . Essential hypertension, benign   . History of anemia   . History of gout   . Hyperlipidemia   . Neck pain   . PAD (peripheral artery disease) (HCC)    hx. LCEA, hx Lt renal art. stenting, occl rt renal artery, occluded bil SFAs, moderatie iliac disease,    . Renal cell cancer ([place]) 2001  . Seizure [facility])     Past Surgical History:  Procedure Laterality Date  . CAROTID ENDARTERECTOMY     LEFT  . NEPHRECTOMY     RIGHT  . NM MYOCAR PERF WALL MOTION  07/12/2011   normal  . PV angiogram  2004   Lt renal artery stent  . URETERAL STENT PLACEMENT     LEFT    Current Outpatient Prescriptions  Medication Sig Dispense Refill  . amLODipine (NORVASC) 5 MG tablet TAKE ONE TABLET BY MOUTH ONCE DAILY 90 tablet 1  . aspirin 81 MG tablet Take 81 mg by mouth daily.    Marland Kitchen atorvastatin (LIPITOR) 20 MG tablet Take 1 tablet (20 mg total) by mouth daily. 90 tablet 3  . clopidogrel (PLAVIX) 75 MG tablet TAKE ONE TABLET BY MOUTH ONCE DAILY 90 tablet 1  . ferrous sulfate 325 (65 FE) MG tablet Take 1 tablet (325 mg total) by mouth daily with breakfast. 30 tablet 3  . glipiZIDE (GLUCOTROL) 5 MG tablet TAKE ONE-HALF TABLET BY MOUTH ONCE DAILY 45 tablet 0  . HYDROcodone-acetaminophen (NORCO) 10-325 MG per tablet Take 1 tablet by mouth every 6 (six) hours as needed for severe pain.    Marland Kitchen lisinopril (PRINIVIL,ZESTRIL) 40 MG  tablet Take 1 tablet (40 mg total) by mouth daily. 90 tablet 3  . mirtazapine (REMERON) 15 MG tablet Take 1 tablet (15 mg total) by mouth at bedtime. 30 tablet 5  . ondansetron (ZOFRAN) 4 MG tablet Take 1 tablet (4 mg total) by mouth every 6 (six) hours. 12 tablet 0  . OVER THE COUNTER MEDICATION Take 1 capsule by mouth daily. Unknown OTC medication for urinary frequency.    . pravastatin (PRAVACHOL) 40 MG tablet Take 40 mg by mouth daily.    . sertraline (ZOLOFT) 25 MG tablet Take 1 tablet (25 mg total) by mouth daily. 90 tablet 3  . traZODone (DESYREL) 50 MG tablet Take 1 tablet (50 mg total) by mouth at bedtime. 30 tablet 5   No current facility-administered medications for this visit.     Allergies as of 07/19/2016 - Review Complete  07/19/2016  Allergen Reaction Noted  . Codeine Nausea And Vomiting 01/17/2012  . Coreg [carvedilol] Nausea And Vomiting 09/09/2014  . Fentanyl  08/21/2012  . Hycodan [hydrocodone-homatropine] Itching 01/17/2012  . Lacosamide  03/16/2012  . Levetiracetam Other (See Comments) 03/16/2012  . Metformin and related  01/17/2012  . Metoprolol  01/17/2012  . Morphine and related Itching 08/21/2012  . Sertraline Nausea Only and Other (See Comments) 06/28/2016  . Tessalon [benzonatate]  12/29/2012  . Other Rash 01/17/2012    Family History  Problem Relation Age of Onset  . Cancer Mother   . Heart disease Father     Social History   Social History  . Marital status: Married    Spouse name: N/A  . Number of children: 2  . Years of education: N/A   Occupational History  . Retired Retired   Social History Main Topics  . Smoking status: Former Smoker    Quit date: 12/26/2005  . Smokeless tobacco: Never Used  . Alcohol use No  . Drug use: No  . Sexual activity: No   Other Topics Concern  . Not on file   Social History Narrative  . No narrative on file     Physical Exam: BP 134/70 (BP Location: Left Arm, Patient Position: Sitting, Cuff Size: Normal)   Pulse 76   Ht _0  (1.549 m)   Wt 133 lb 9.6 oz (60.6 kg)   BMI 25.24 kg/m  Constitutional: generally well-appearing Psychiatric: alert and oriented x3 Abdomen: soft, nontender, nondistended, no obvious ascites, no peritoneal signs, normal bowel sounds   Assessment and plan: 81 y.o. female withChronic anemia, abnormal colon on recent CT scan 2 years ago I recommended that she undergo colonoscopy for mild chronic anemia. She ever had that done. Now she has similar blood counts but also slightly abnormal colon on recent CT scan. I think it is unlikely she has anything significant like a colon cancer going on but did recommend colonoscopy at her soonest convenience to make sure. She takes Plavix and we will communicate with her  primary care physician about the safety of her Plavix for 5 days prior to colonoscopy as his usual. Owens Loffler, MD [facility] 07/19/2016, 3:50 PM

## 2016-07-20 ENCOUNTER — Telehealth: Payer: Self-pay | Admitting: Gastroenterology

## 2016-07-20 NOTE — Telephone Encounter (Signed)
Left message on machine to call back regarding plavix hold per Dr Gwenlyn Found

## 2016-07-20 NOTE — Telephone Encounter (Signed)
Okay to interrupt antiplatelet therapy for endoscopy 

## 2016-07-20 NOTE — Telephone Encounter (Signed)
She is a regular patient of Dr. Gwenlyn Found cardiology. She has had a carotid endarterectomy. They will have to give clearance regarding her Plavix.

## 2016-07-20 NOTE — Telephone Encounter (Signed)
Magda Paganini can you help with this?  Thanks.

## 2016-07-20 NOTE — Telephone Encounter (Signed)
Dr Gwenlyn Found can you please review for anticoagulation hold prior to upcoming procedure.

## 2016-07-21 NOTE — Telephone Encounter (Signed)
Spoke with patient and let her know that I would call her closer to her procedure so she could come pick up a Suprep sample.  Patient agreed

## 2016-07-25 NOTE — Telephone Encounter (Signed)
The pt is aware that she can hold plavix per Dr Gwenlyn Found.  She will call with any further concerns.  She verbalized understanding

## 2016-08-06 ENCOUNTER — Encounter: Payer: Self-pay | Admitting: Family Medicine

## 2016-08-06 ENCOUNTER — Other Ambulatory Visit: Payer: Self-pay | Admitting: Emergency Medicine

## 2016-08-06 ENCOUNTER — Ambulatory Visit (INDEPENDENT_AMBULATORY_CARE_PROVIDER_SITE_OTHER): Payer: Medicare Other | Admitting: Family Medicine

## 2016-08-06 VITALS — BP 122/72 | HR 97 | Temp 98.6°F | Resp 17 | Ht 60.5 in | Wt 132.0 lb

## 2016-08-06 DIAGNOSIS — S4991XA Unspecified injury of right shoulder and upper arm, initial encounter: Secondary | ICD-10-CM | POA: Diagnosis not present

## 2016-08-06 DIAGNOSIS — T148XXA Other injury of unspecified body region, initial encounter: Secondary | ICD-10-CM

## 2016-08-06 DIAGNOSIS — T148 Other injury of unspecified body region: Secondary | ICD-10-CM | POA: Diagnosis not present

## 2016-08-06 LAB — POCT CBC
Granulocyte percent: 67.3 %G (ref 37–80)
HCT, POC: 31.3 % — AB (ref 37.7–47.9)
Hemoglobin: 10.6 g/dL — AB (ref 12.2–16.2)
Lymph, poc: 1.5 (ref 0.6–3.4)
MCH, POC: 28.4 pg (ref 27–31.2)
MCHC: 33.9 g/dL (ref 31.8–35.4)
MCV: 84 fL (ref 80–97)
MID (cbc): 0.4 (ref 0–0.9)
MPV: 8.4 fL (ref 0–99.8)
POC Granulocyte: 3.9 (ref 2–6.9)
POC LYMPH PERCENT: 25.3 %L (ref 10–50)
POC MID %: 7.4 %M (ref 0–12)
Platelet Count, POC: 294 10*3/uL (ref 142–424)
RBC: 3.73 M/uL — AB (ref 4.04–5.48)
RDW, POC: 16.7 %
WBC: 5.8 10*3/uL (ref 4.6–10.2)

## 2016-08-06 LAB — COMPREHENSIVE METABOLIC PANEL
ALT: 13 U/L (ref 6–29)
AST: 17 U/L (ref 10–35)
Albumin: 3.9 g/dL (ref 3.6–5.1)
Alkaline Phosphatase: 59 U/L (ref 33–130)
BUN: 25 mg/dL (ref 7–25)
CO2: 25 mmol/L (ref 20–31)
Calcium: 9 mg/dL (ref 8.6–10.4)
Chloride: 103 mmol/L (ref 98–110)
Creat: 1.78 mg/dL — ABNORMAL HIGH (ref 0.60–0.93)
Glucose, Bld: 130 mg/dL — ABNORMAL HIGH (ref 65–99)
Potassium: 4.6 mmol/L (ref 3.5–5.3)
Sodium: 139 mmol/L (ref 135–146)
Total Bilirubin: 0.3 mg/dL (ref 0.2–1.2)
Total Protein: 6.4 g/dL (ref 6.1–8.1)

## 2016-08-06 LAB — PROTIME-INR
INR: 1
Prothrombin Time: 10.5 s (ref 9.0–11.5)

## 2016-08-06 NOTE — Progress Notes (Signed)
Gabrielle [name] is a 81 y.o. female who presents to Urgent Care today for bruising  1.  Bruising:  Noticed this yesterday.  Noted bruising on Right forearm and Right upper arm/bicep area.  No trauma, did not bump into anything that she knows of or otherwise no of reason for bruising.  No changes to her medications.  She does take both ASA and Plavix and has done so "for years."  Has strong PAD history plus CVA.  No epistaxis, no bleeding when she brushes her teeth.  No chest pain or weakness/numbness, headaches, or changes in vision.  No abdominal pain.    ROS as above.    PMH reviewed. Patient is a nonsmoker.   Past Medical History:  Diagnosis Date  . Arthritis   . Azotemia   . Carotid artery disease ([place])   . Claudication ([place])   . CVA (cerebral vascular accident) ([place])    [place] DIZZINESS 2008  . Diabetes mellitus    1990  . Essential hypertension, benign   . History of anemia   . History of gout   . Hyperlipidemia   . Neck pain   . PAD (peripheral artery disease) (HCC)    hx. LCEA, hx Lt renal art. stenting, occl rt renal artery, occluded bil SFAs, moderatie iliac disease,    . Renal cell cancer ([place]) 2001  . Seizure [facility])    Past Surgical History:  Procedure Laterality Date  . CAROTID ENDARTERECTOMY     LEFT  . NEPHRECTOMY     RIGHT  . NM MYOCAR PERF WALL MOTION  07/12/2011   normal  . PV angiogram  2004   Lt renal artery stent  . URETERAL STENT PLACEMENT     LEFT    Medications reviewed. Current Outpatient Prescriptions  Medication Sig Dispense Refill  . amLODipine (NORVASC) 5 MG tablet TAKE ONE TABLET BY MOUTH ONCE DAILY 90 tablet 1  . aspirin 81 MG tablet Take 81 mg by mouth daily.    Marland Kitchen atorvastatin (LIPITOR) 20 MG tablet Take 1 tablet (20 mg total) by mouth daily. 90 tablet 3  . clopidogrel (PLAVIX) 75 MG tablet TAKE ONE TABLET BY MOUTH ONCE DAILY 90 tablet 1  . ferrous sulfate 325 (65 FE) MG tablet Take 1 tablet (325 mg total) by mouth daily with breakfast. 30  tablet 3  . HYDROcodone-acetaminophen (NORCO) 10-325 MG per tablet Take 1 tablet by mouth every 6 (six) hours as needed for severe pain.    Marland Kitchen lisinopril (PRINIVIL,ZESTRIL) 40 MG tablet Take 1 tablet (40 mg total) by mouth daily. 90 tablet 3  . mirtazapine (REMERON) 15 MG tablet Take 1 tablet (15 mg total) by mouth at bedtime. 30 tablet 5  . ondansetron (ZOFRAN) 4 MG tablet Take 1 tablet (4 mg total) by mouth every 6 (six) hours. 12 tablet 0  . OVER THE COUNTER MEDICATION Take 1 capsule by mouth daily. Unknown OTC medication for urinary frequency.    . pravastatin (PRAVACHOL) 40 MG tablet Take 40 mg by mouth daily.    . sertraline (ZOLOFT) 25 MG tablet Take 1 tablet (25 mg total) by mouth daily. 90 tablet 3  . traZODone (DESYREL) 50 MG tablet Take 1 tablet (50 mg total) by mouth at bedtime. 30 tablet 5  . glipiZIDE (GLUCOTROL) 5 MG tablet TAKE ONE-HALF TABLET BY MOUTH ONCE DAILY (Patient not taking: Reported on 08/06/2016) 45 tablet 0   No current facility-administered medications for this visit.    Results for orders placed  or performed in visit on 08/06/16  POCT CBC  Result Value Ref Range   WBC 5.8 4.6 - 10.2 K/uL   Lymph, poc 1.5 0.6 - 3.4   POC LYMPH PERCENT 25.3 10 - 50 %L   MID (cbc) 0.4 0 - 0.9   POC MID % 7.4 0 - 12 %M   POC Granulocyte 3.9 2 - 6.9   Granulocyte percent 67.3 37 - 80 %G   RBC 3.73 (A) 4.04 - 5.48 M/uL   Hemoglobin 10.6 (A) 12.2 - 16.2 g/dL   HCT, POC 31.3 (A) 37.7 - 47.9 %   MCV 84.0 80 - 97 fL   MCH, POC 28.4 27 - 31.2 pg   MCHC 33.9 31.8 - 35.4 g/dL   RDW, POC 16.7 %   Platelet Count, POC 294 142 - 424 K/uL   MPV 8.4 0 - 99.8 fL     Physical Exam:  BP 122/72 (BP Location: Right Arm, Patient Position: Sitting, Cuff Size: Normal)   Pulse 97   Temp 98.6 F (37 C) (Oral)   Resp 17   Ht 5' 0.5" (1.537 m)   Wt 132 lb (59.9 kg)   SpO2 99%   BMI 25.36 kg/m  Gen:  Alert, cooperative patient who appears stated age in no acute distress.  Vital signs  reviewed. HEENT: EOMI,  MMM.  No oral lesions noted.  No petechiae or pupura noted in mouth.  Poor dentition. Pulm:  Clear to auscultation bilaterally with good air movement.  No wheezes or rales noted.   Cardiac:  Regular rate and rhythm without murmur auscultated.  Good S1/S2. Abd:  Soft/nondistended/nontender.  Good bowel sounds throughout all four quadrants.  No masses noted.  Skin:  2 cm purpura noted Right forearm.  Nontender.  Also with 3 scattered smaller 0.5 cm purpura noted Right bicep.  Also with 3 mm purpura on Left forehead at scalp.  No other purpura noted throughout skin exam.  No petechiae noted.    Assessment and Plan:  1.  Senile purpura: - most likely diagnosis.  - reassurance provided. - Checking CBC here.  Also CMET for liver function and PT/INR.   - Will call if abnormal.

## 2016-08-06 NOTE — Progress Notes (Signed)
Noted. Philis Fendt, MS, PA-C 7:36 PM, 08/06/2016

## 2016-08-06 NOTE — Patient Instructions (Addendum)
You have what is called "senile purpura."  This means that you have bruising under your skin coming from thinning blood vessels.  Your blood thinners are also making this more likely.   We are checking your liver and kidney function tests as well to make sure everything else is normal.    If you start to see any worsening bleeding, let us know.      IF you received an x-ray today, you will receive an invoice from [facility]. Please contact [facility] at [phone] with questions or concerns regarding your invoice.   IF you received labwork today, you will receive an invoice from [employer]. Please contact [facility] at [phone] with questions or concerns regarding your invoice.   Our billing staff will not be able to assist you with questions regarding bills from these companies.  You will be contacted with the lab results as soon as they are available. The fastest way to get your results is to activate your My Chart account. Instructions are located on the last page of this paperwork. If you have not heard from [place] regarding the results in 2 weeks, please contact this office.

## 2016-08-09 ENCOUNTER — Telehealth: Payer: Self-pay | Admitting: Emergency Medicine

## 2016-08-09 NOTE — Telephone Encounter (Signed)
-----   Message from Alveda Reasons, MD sent at 08/09/2016  9:19 AM EDT ----- Please call and let Ms. Bumpas know that her bleeding tests look fine.  The rest of her labs also looked good.  Thanks!  JW

## 2016-08-10 ENCOUNTER — Encounter: Payer: Medicare Other | Admitting: Gastroenterology

## 2016-08-12 ENCOUNTER — Other Ambulatory Visit: Payer: Self-pay | Admitting: Emergency Medicine

## 2016-08-15 ENCOUNTER — Ambulatory Visit (INDEPENDENT_AMBULATORY_CARE_PROVIDER_SITE_OTHER): Payer: Medicare Other | Admitting: Emergency Medicine

## 2016-08-15 VITALS — BP 132/76 | HR 104 | Temp 98.7°F | Resp 18 | Ht 60.5 in | Wt 131.0 lb

## 2016-08-15 DIAGNOSIS — S5011XA Contusion of right forearm, initial encounter: Secondary | ICD-10-CM | POA: Diagnosis not present

## 2016-08-15 DIAGNOSIS — T148 Other injury of unspecified body region: Secondary | ICD-10-CM

## 2016-08-15 DIAGNOSIS — R809 Proteinuria, unspecified: Secondary | ICD-10-CM | POA: Diagnosis not present

## 2016-08-15 DIAGNOSIS — D509 Iron deficiency anemia, unspecified: Secondary | ICD-10-CM | POA: Diagnosis not present

## 2016-08-15 DIAGNOSIS — Z23 Encounter for immunization: Secondary | ICD-10-CM | POA: Diagnosis not present

## 2016-08-15 DIAGNOSIS — T148XXA Other injury of unspecified body region, initial encounter: Secondary | ICD-10-CM

## 2016-08-15 LAB — POCT URINALYSIS DIP (MANUAL ENTRY)
Bilirubin, UA: NEGATIVE
Blood, UA: NEGATIVE
Glucose, UA: NEGATIVE
Ketones, POC UA: NEGATIVE
Leukocytes, UA: NEGATIVE
Nitrite, UA: NEGATIVE
Protein Ur, POC: 100 — AB
Spec Grav, UA: 1.02
Urobilinogen, UA: 0.2
pH, UA: 7

## 2016-08-15 LAB — POCT CBC
Granulocyte percent: 73.4 %G (ref 37–80)
HCT, POC: 31.9 % — AB (ref 37.7–47.9)
Hemoglobin: 11.2 g/dL — AB (ref 12.2–16.2)
Lymph, poc: 1.2 (ref 0.6–3.4)
MCH, POC: 29.1 pg (ref 27–31.2)
MCHC: 35.1 g/dL (ref 31.8–35.4)
MCV: 83 fL (ref 80–97)
MID (cbc): 0.7 (ref 0–0.9)
MPV: 9.1 fL (ref 0–99.8)
POC Granulocyte: 5.1 (ref 2–6.9)
POC LYMPH PERCENT: 16.9 %L (ref 10–50)
POC MID %: 9.7 %M (ref 0–12)
Platelet Count, POC: 254 10*3/uL (ref 142–424)
RBC: 3.84 M/uL — AB (ref 4.04–5.48)
RDW, POC: 15.9 %
WBC: 7 10*3/uL (ref 4.6–10.2)

## 2016-08-15 NOTE — Progress Notes (Signed)
Patient ID: Gabrielle [name], female   DOB: 1935-01-17, 81 y.o.   MRN: [id number]    By signing my name below, I, Essence Howell, attest that this documentation has been prepared under the direction and in the presence of Darlyne Russian, MD Electronically Signed: [name], ED Scribe 08/15/2016 at 10:47 AM.  Chief Complaint:  Chief Complaint  Patient presents with  . Eczema    BRUSIES ON ARMS PATIENT NOT SURE WHERE FROM   HPI: Gabrielle [name] is a 81 y.o. female, with a h/o PAD and CVA, who reports to [facility] today complaining of bruising to her right arm. Pt was seen in the office 9 days ago for bruising to the right forearm and right upper arm/bicep first noticed the day prior. No trauma; denied bumping into anything and any reason for bruising. At that visit, pt denied changes to her medications. She was currently taking both aspirin and Plavix and had been on these medications "for years". Today she reports that she is still taking Plavix but has stopped aspirin since her last visit. She has been compliant with her iron medication. Pt denies bruising to her abdomen or lower extremities. She has an appointment on 09/13/16 to see GI about bruising and anemia.   Past Medical History:  Diagnosis Date  . Arthritis   . Azotemia   . Carotid artery disease ([place])   . Claudication ([place])   . CVA (cerebral vascular accident) ([place])    [place] DIZZINESS 2008  . Diabetes mellitus    1990  . Essential hypertension, benign   . History of anemia   . History of gout   . Hyperlipidemia   . Neck pain   . PAD (peripheral artery disease) (HCC)    hx. LCEA, hx Lt renal art. stenting, occl rt renal artery, occluded bil SFAs, moderatie iliac disease,    . Renal cell cancer ([place]) 2001  . Seizure [facility])    Past Surgical History:  Procedure Laterality Date  . CAROTID ENDARTERECTOMY     LEFT  . NEPHRECTOMY     RIGHT  . NM MYOCAR PERF WALL MOTION  07/12/2011   normal  . PV angiogram  2004   Lt renal artery  stent  . URETERAL STENT PLACEMENT     LEFT   Social History   Social History  . Marital status: Married    Spouse name: N/A  . Number of children: 2  . Years of education: N/A   Occupational History  . Retired Retired   Social History Main Topics  . Smoking status: Former Smoker    Quit date: 12/26/2005  . Smokeless tobacco: Never Used  . Alcohol use No  . Drug use: No  . Sexual activity: No   Other Topics Concern  . None   Social History Narrative  . None   Family History  Problem Relation Age of Onset  . Cancer Mother   . Heart disease Father    Allergies  Allergen Reactions  . Codeine Nausea And Vomiting  . Coreg [Carvedilol] Nausea And Vomiting  . Fentanyl   . Hycodan [Hydrocodone-Homatropine] Itching  . Lacosamide      Other name is, VIMPAT  . Levetiracetam Other (See Comments)    Strange feelings in head  . Metformin And Related   . Metoprolol   . Morphine And Related Itching  . Sertraline Nausea Only and Other (See Comments)    Swelling in mouth  . Tessalon [Benzonatate]   .  Other Rash    BETA BLOCKER   Prior to Admission medications   Medication Sig Start Date End Date Taking? Authorizing Provider  amLODipine (NORVASC) 5 MG tablet TAKE ONE TABLET BY MOUTH ONCE DAILY 08/08/16  Yes Darlyne Russian, MD  aspirin 81 MG tablet Take 81 mg by mouth daily.   Yes Historical Provider, MD  atorvastatin (LIPITOR) 20 MG tablet Take 1 tablet (20 mg total) by mouth daily. 06/20/16  Yes Darlyne Russian, MD  clopidogrel (PLAVIX) 75 MG tablet TAKE ONE TABLET BY MOUTH ONCE DAILY 06/06/16  Yes Darlyne Russian, MD  ferrous sulfate 325 (65 FE) MG tablet Take 1 tablet (325 mg total) by mouth daily with breakfast. 05/03/16  Yes Darlyne Russian, MD  glipiZIDE (GLUCOTROL) 5 MG tablet TAKE ONE-HALF TABLET BY MOUTH ONCE DAILY 03/22/16  Yes Darlyne Russian, MD  HYDROcodone-acetaminophen (NORCO) 10-325 MG per tablet Take 1 tablet by mouth every 6 (six) hours as needed for severe pain.   Yes  Historical Provider, MD  lisinopril (PRINIVIL,ZESTRIL) 40 MG tablet Take 1 tablet (40 mg total) by mouth daily. 09/17/15  Yes [name], MD  mirtazapine (REMERON) 15 MG tablet Take 1 tablet (15 mg total) by mouth at bedtime. 07/08/16  Yes Darlyne Russian, MD  ondansetron (ZOFRAN) 4 MG tablet Take 1 tablet (4 mg total) by mouth every 6 (six) hours. 06/28/16  Yes [name], MD  OVER THE COUNTER MEDICATION Take 1 capsule by mouth daily. Unknown OTC medication for urinary frequency.   Yes Historical Provider, MD  pravastatin (PRAVACHOL) 40 MG tablet Take 40 mg by mouth daily.   Yes Historical Provider, MD  sertraline (ZOLOFT) 25 MG tablet Take 1 tablet (25 mg total) by mouth daily. 05/09/16  Yes Carmen Dohmeier, MD  traZODone (DESYREL) 50 MG tablet Take 1 tablet (50 mg total) by mouth at bedtime. 07/08/16  Yes Darlyne Russian, MD   ROS: The patient denies fevers, chills, night sweats, unintentional weight loss, chest pain, palpitations, wheezing, dyspnea on exertion, nausea, vomiting, abdominal pain, dysuria, hematuria, melena, numbness, weakness, or tingling. +color change (R arm)  All other systems have been reviewed and were otherwise negative with the exception of those mentioned in the HPI and as above.    PHYSICAL EXAM: Vitals:   08/15/16 0923  BP: 132/76  Pulse: (!) 104  Resp: 18  Temp: 98.7 F (37.1 C)   Body mass index is 25.16 kg/m.  General: Alert, no acute distress HEENT:  Normocephalic, atraumatic, oropharynx patent. Eye: [facility] Cardiovascular: Regular rate and rhythm, no rubs murmurs or gallops. No Carotid bruits, radial pulse intact. No pedal edema.  Respiratory: Clear to auscultation bilaterally.  No wheezes, rales, or rhonchi. No cyanosis, no use of accessory musculature Abdominal: No organomegaly, abdomen is soft and non-tender, positive bowel sounds. No masses. Musculoskeletal: Gait intact. No edema, tenderness Skin: No rashes. Resolving bruising of her R  arm. No bruising on chest, abdomen or back.  Neurologic: Facial musculature symmetric. Psychiatric: Patient acts appropriately throughout our interaction. Lymphatic: No cervical or submandibular lymphadenopathy  LABS: Results for orders placed or performed in visit on 08/15/16  POCT CBC  Result Value Ref Range   WBC 7.0 4.6 - 10.2 K/uL   Lymph, poc 1.2 0.6 - 3.4   POC LYMPH PERCENT 16.9 10 - 50 %L   MID (cbc) 0.7 0 - 0.9   POC MID % 9.7 0 - 12 %M   POC Granulocyte 5.1 2 -  6.9   Granulocyte percent 73.4 37 - 80 %G   RBC 3.84 (A) 4.04 - 5.48 M/uL   Hemoglobin 11.2 (A) 12.2 - 16.2 g/dL   HCT, POC 31.9 (A) 37.7 - 47.9 %   MCV 83.0 80 - 97 fL   MCH, POC 29.1 27 - 31.2 pg   MCHC 35.1 31.8 - 35.4 g/dL   RDW, POC 15.9 %   Platelet Count, POC 254 142 - 424 K/uL   MPV 9.1 0 - 99.8 fL  POCT urinalysis dipstick  Result Value Ref Range   Color, UA yellow yellow   Clarity, UA clear clear   Glucose, UA negative negative   Bilirubin, UA negative negative   Ketones, POC UA negative negative   Spec Grav, UA 1.020    Blood, UA negative negative   pH, UA 7.0    Protein Ur, POC =100 (A) negative   Urobilinogen, UA 0.2    Nitrite, UA Negative Negative   Leukocytes, UA Negative Negative    EKG/XRAY:   Primary read interpreted by Dr. [name] at [facility].  ASSESSMENT/PLAN: Hemoglobin was down to 11.2. Her last was actually lower at 10.6. She does have a follow-up appointment with GI. She is taking her iron. She will continue on Plavix and off aspirin for now. She is going to contact the nephrology office regarding follow-up of her proteinuria and diminished renal function. She also has an appointment to see Dr. Casimiro Needle to discuss with him alternative meds  to take for insomnia.I personally performed the services described in this documentation, which was scribed in my presence. The recorded information has been reviewed and is accurate.   Gross sideeffects, risk and benefits, and alternatives of  medications d/w patient. Patient is aware that all medications have potential sideeffects and we are unable to predict every sideeffect or drug-drug interaction that may occur.  [name] MD 08/15/2016 9:44 AM

## 2016-08-15 NOTE — Patient Instructions (Addendum)
IF you received an x-ray today, you will receive an invoice from [facility]. Please contact [facility] at [phone] with questions or concerns regarding your invoice.   IF you received labwork today, you will receive an invoice from [employer]. Please contact [facility] at [phone] with questions or concerns regarding your invoice.   Our billing staff will not be able to assist you with questions regarding bills from these companies.  You will be contacted with the lab results as soon as they are available. The fastest way to get your results is to activate your My Chart account. Instructions are located on the last page of this paperwork. If you have not heard from [place] regarding the results in 2 weeks, please contact this office.    Influenza Virus Vaccine injection (Fluarix) What is this medicine? INFLUENZA VIRUS VACCINE (in floo EN zuh VAHY ruhs vak SEEN) helps to reduce the risk of getting influenza also known as the flu. This medicine may be used for other purposes; ask your health care provider or pharmacist if you have questions. What should I tell my health care provider before I take this medicine? They need to know if you have any of these conditions: -bleeding disorder like hemophilia -fever or infection -Guillain-Barre syndrome or other neurological problems -immune system problems -infection with the human immunodeficiency virus (HIV) or AIDS -low blood platelet counts -multiple sclerosis -an unusual or allergic reaction to influenza virus vaccine, eggs, chicken proteins, latex, gentamicin, other medicines, foods, dyes or preservatives -pregnant or trying to get pregnant -breast-feeding How should I use this medicine? This vaccine is for injection into a muscle. It is given by a health care professional. A copy of Vaccine Information Statements will be given before each vaccination. Read this sheet carefully each time.  The sheet may change frequently. Talk to your pediatrician regarding the use of this medicine in children. Special care may be needed. Overdosage: If you think you have taken too much of this medicine contact a poison control center or emergency room at once. NOTE: This medicine is only for you. Do not share this medicine with others. What if I miss a dose? This does not apply. What may interact with this medicine? -chemotherapy or radiation therapy -medicines that lower your immune system like etanercept, anakinra, infliximab, and adalimumab -medicines that treat or prevent blood clots like warfarin -phenytoin -steroid medicines like prednisone or cortisone -theophylline -vaccines This list may not describe all possible interactions. Give your health care provider a list of all the medicines, herbs, non-prescription drugs, or dietary supplements you use. Also tell them if you smoke, drink alcohol, or use illegal drugs. Some items may interact with your medicine. What should I watch for while using this medicine? Report any side effects that do not go away within 3 days to your doctor or health care professional. Call your health care provider if any unusual symptoms occur within 6 weeks of receiving this vaccine. You may still catch the flu, but the illness is not usually as bad. You cannot get the flu from the vaccine. The vaccine will not protect against colds or other illnesses that may cause fever. The vaccine is needed every year. What side effects may I notice from receiving this medicine? Side effects that you should report to your doctor or health care professional as soon as possible: -allergic reactions like skin rash, itching or hives, swelling of the face, lips, or tongue Side effects that usually do  not require medical attention (report to your doctor or health care professional if they continue or are bothersome): -fever -headache -muscle aches and pains -pain, tenderness,  redness, or swelling at site where injected -weak or tired This list may not describe all possible side effects. Call your doctor for medical advice about side effects. You may report side effects to FDA at 1-800-FDA-1088. Where should I keep my medicine? This vaccine is only given in a clinic, pharmacy, doctor's office, or other health care setting and will not be stored at home. NOTE: This sheet is a summary. It may not cover all possible information. If you have questions about this medicine, talk to your doctor, pharmacist, or health care provider.    2016, Elsevier/Gold Standard. (2008-07-09 09:30:40)

## 2016-09-05 ENCOUNTER — Other Ambulatory Visit: Payer: Self-pay | Admitting: Family Medicine

## 2016-09-05 DIAGNOSIS — I10 Essential (primary) hypertension: Secondary | ICD-10-CM

## 2016-09-06 ENCOUNTER — Telehealth: Payer: Self-pay | Admitting: Gastroenterology

## 2016-09-06 NOTE — Telephone Encounter (Signed)
Patient is also wanting to know when she needs to stop taking plavix?

## 2016-09-06 NOTE — Telephone Encounter (Signed)
Told patient I would leave her Suprep sample up front to be picked up.  I told her that, per Patty, the amount of time to hold Plavix was 5 days.   Patient agreed.

## 2016-09-12 ENCOUNTER — Telehealth: Payer: Self-pay | Admitting: Gastroenterology

## 2016-09-12 ENCOUNTER — Ambulatory Visit (INDEPENDENT_AMBULATORY_CARE_PROVIDER_SITE_OTHER): Payer: Medicare Other | Admitting: Emergency Medicine

## 2016-09-12 VITALS — BP 158/80 | HR 119 | Temp 98.4°F | Resp 16 | Ht 61.0 in | Wt 131.0 lb

## 2016-09-12 DIAGNOSIS — F329 Major depressive disorder, single episode, unspecified: Secondary | ICD-10-CM | POA: Diagnosis not present

## 2016-09-12 DIAGNOSIS — I10 Essential (primary) hypertension: Secondary | ICD-10-CM | POA: Diagnosis not present

## 2016-09-12 DIAGNOSIS — R739 Hyperglycemia, unspecified: Secondary | ICD-10-CM

## 2016-09-12 DIAGNOSIS — F32A Depression, unspecified: Secondary | ICD-10-CM

## 2016-09-12 DIAGNOSIS — R000 Tachycardia, unspecified: Secondary | ICD-10-CM | POA: Diagnosis not present

## 2016-09-12 DIAGNOSIS — T148XXA Other injury of unspecified body region, initial encounter: Secondary | ICD-10-CM

## 2016-09-12 DIAGNOSIS — D509 Iron deficiency anemia, unspecified: Secondary | ICD-10-CM

## 2016-09-12 LAB — POCT GLYCOSYLATED HEMOGLOBIN (HGB A1C): Hemoglobin A1C: 6.3

## 2016-09-12 MED ORDER — CLOPIDOGREL BISULFATE 75 MG PO TABS: 75.0000 mg | ORAL_TABLET | Freq: Every day | ORAL | 2 refills | Status: DC

## 2016-09-12 MED ORDER — GLIPIZIDE 5 MG PO TABS: 2.5000 mg | ORAL_TABLET | Freq: Every day | ORAL | 3 refills | Status: DC

## 2016-09-12 MED ORDER — LISINOPRIL 40 MG PO TABS: 40.0000 mg | ORAL_TABLET | Freq: Every day | ORAL | 3 refills | Status: DC

## 2016-09-12 MED ORDER — AMLODIPINE BESYLATE 5 MG PO TABS: 5.0000 mg | ORAL_TABLET | Freq: Every day | ORAL | 3 refills | Status: DC

## 2016-09-12 NOTE — Progress Notes (Addendum)
Patient ID: Gabrielle [name], female   DOB: 03-11-1935, 81 y.o.   MRN: [id number]    By signing my name below, I, Gabrielle [name], attest that this documentation has been prepared under the direction and in the presence of Gabrielle Russian, MD Electronically Signed: Ladene [name], ED Scribe 09/12/2016 at 10:32 AM.  Chief Complaint:  Chief Complaint  Patient presents with  . Medication Refill    all medicine    HPI: Gabrielle [name] is a 81 y.o. female, with a h/o HTN, DM and hyperlipidemia, who reports to Gabrielle [name] today for a medication refill. Pt's triage BP: 158/80 with a HR of 119. Her repeat HR was 92. She reports increased stress lately that she attributes to worrying about her husband Gabrielle [name] who is in a nursing home. Pt had a Cmet done on 08/06/16 with a blood glucose of 130 and an A1C done on 05/03/16 with an A1C of 5.8. Pt had an appointment scheduled with GI tomorrow but cancelled.   Past Medical History:  Diagnosis Date  . Arthritis   . Azotemia   . Carotid artery disease ([place])   . Claudication ([place])   . CVA (cerebral vascular accident) ([place])    [place] DIZZINESS 2008  . Diabetes mellitus    1990  . Essential hypertension, benign   . History of anemia   . History of gout   . Hyperlipidemia   . Neck pain   . PAD (peripheral artery disease) (HCC)    hx. LCEA, hx Lt renal art. stenting, occl rt renal artery, occluded bil SFAs, moderatie iliac disease,    . Renal cell cancer ([place]) 2001  . Seizure [facility])    Past Surgical History:  Procedure Laterality Date  . CAROTID ENDARTERECTOMY     LEFT  . NEPHRECTOMY     RIGHT  . NM MYOCAR PERF WALL MOTION  07/12/2011   normal  . PV angiogram  2004   Lt renal artery stent  . URETERAL STENT PLACEMENT     LEFT   Social History   Social History  . Marital status: Married    Spouse name: N/A  . Number of children: 2  . Years of education: N/A   Occupational History  . Retired Retired   Social History Main Topics  . Smoking status:  Former Smoker    Quit date: 12/26/2005  . Smokeless tobacco: Never Used  . Alcohol use No  . Drug use: No  . Sexual activity: No   Other Topics Concern  . None   Social History Narrative  . None   Family History  Problem Relation Age of Onset  . Cancer Mother   . Heart disease Father    Allergies  Allergen Reactions  . Codeine Nausea And Vomiting  . Coreg [Carvedilol] Nausea And Vomiting  . Fentanyl   . Hycodan [Hydrocodone-Homatropine] Itching  . Lacosamide      Other name is, VIMPAT  . Levetiracetam Other (See Comments)    Strange feelings in head  . Metformin And Related   . Metoprolol   . Morphine And Related Itching  . Sertraline Nausea Only and Other (See Comments)    Swelling in mouth  . Tessalon [Benzonatate]   . Other Rash    BETA BLOCKER   Prior to Admission medications   Medication Sig Start Date End Date Taking? Authorizing Provider  amLODipine (NORVASC) 5 MG tablet TAKE ONE TABLET BY MOUTH ONCE DAILY 08/08/16  Yes Gabrielle Russian, MD  aspirin 81 MG tablet Take 81 mg by mouth daily.   Yes Historical Provider, MD  atorvastatin (LIPITOR) 20 MG tablet Take 1 tablet (20 mg total) by mouth daily. 06/20/16  Yes Gabrielle Russian, MD  clopidogrel (PLAVIX) 75 MG tablet TAKE ONE TABLET BY MOUTH ONCE DAILY 06/06/16  Yes Gabrielle Russian, MD  ferrous sulfate 325 (65 FE) MG tablet Take 1 tablet (325 mg total) by mouth daily with breakfast. 05/03/16  Yes Gabrielle Russian, MD  glipiZIDE (GLUCOTROL) 5 MG tablet TAKE ONE-HALF TABLET BY MOUTH ONCE DAILY 03/22/16  Yes Gabrielle Russian, MD  HYDROcodone-acetaminophen (NORCO) 10-325 MG per tablet Take 1 tablet by mouth every 6 (six) hours as needed for severe pain.   Yes Historical Provider, MD  lisinopril (PRINIVIL,ZESTRIL) 40 MG tablet TAKE ONE TABLET BY MOUTH ONCE DAILY 09/06/16  Yes Gabrielle Russian, MD  mirtazapine (REMERON) 15 MG tablet Take 1 tablet (15 mg total) by mouth at bedtime. 07/08/16  Yes Gabrielle Russian, MD  OVER THE COUNTER MEDICATION Take  1 capsule by mouth daily. Unknown OTC medication for urinary frequency.   Yes Historical Provider, MD  pravastatin (PRAVACHOL) 40 MG tablet Take 40 mg by mouth daily.   Yes Historical Provider, MD  sertraline (ZOLOFT) 25 MG tablet Take 1 tablet (25 mg total) by mouth daily. 05/09/16  Yes Gabrielle Dohmeier, MD  traZODone (DESYREL) 50 MG tablet Take 1 tablet (50 mg total) by mouth at bedtime. 07/08/16  Yes Gabrielle Russian, MD    ROS: The patient denies fevers, chills, night sweats, unintentional weight loss, chest pain, palpitations, wheezing, dyspnea on exertion, nausea, vomiting, abdominal pain, dysuria, hematuria, melena, numbness, weakness, or tingling.   All other systems have been reviewed and were otherwise negative with the exception of those mentioned in the HPI and as above.    PHYSICAL EXAM: Vitals:   09/12/16 0951  BP: (!) 158/80  Pulse: (!) 119  Resp: 16  Temp: 98.4 F (36.9 C)   Body mass index is 24.75 kg/m.   General: Alert, no acute distress HEENT:  Normocephalic, atraumatic, oropharynx patent. Eye: Gabrielle [name] [facility] Cardiovascular:  Regular rate and rhythm, no rubs murmurs or gallops.  No Carotid bruits, radial pulse intact. No pedal edema.  Respiratory: Clear to auscultation bilaterally.  No wheezes, rales, or rhonchi.  No cyanosis, no use of accessory musculature Abdominal: No organomegaly, abdomen is soft and non-tender, positive bowel sounds.  No masses. Musculoskeletal: Gait intact. No edema, tenderness Skin: No rashes. Neurologic: Facial musculature symmetric. Psychiatric: Patient acts appropriately throughout our interaction. Lymphatic: No cervical or submandibular lymphadenopathy  LABS: Results for orders placed or performed in visit on 09/12/16  POCT glycosylated hemoglobin (Hb A1C)  Result Value Ref Range   Hemoglobin A1C 6.3     EKG/XRAY:   Primary read interpreted by Gabrielle [name] at [facility] Sinus rhythm PAC's  ASSESSMENT/PLAN: Hemoglobin A1c has create up to  6.3. . Her blood pressure medications were refilled. She was given a flu shot today.I personally performed the services described in this documentation, which was scribed in my presence. The recorded information has been reviewed and is accurate.Her blood pressure was elevated today but she states she was stressed out about her husband. She was also somewhat tachycardic initially but EKG shows a sinus rhythm with one PAC. I was concerned she could have A. fib but EKG did not demonstrate this and had a heart rate of 87.I personally performed the services described in this documentation,  which was scribed in my presence. The recorded information has been reviewed and is accurate.   Gross sideeffects, risk and benefits, and alternatives of medications d/w patient. Patient is aware that all medications have potential sideeffects and we are unable to predict every sideeffect or drug-drug interaction that may occur.  [name] MD 09/12/2016 10:32 AM

## 2016-09-12 NOTE — Patient Instructions (Signed)
     IF you received an x-ray today, you will receive an invoice from [facility]. Please contact [facility] at [phone] with questions or concerns regarding your invoice.   IF you received labwork today, you will receive an invoice from [facility]. Please contact [facility] at [phone] with questions or concerns regarding your invoice.   Our billing staff will not be able to assist you with questions regarding bills from these companies.  You will be contacted with the lab results as soon as they are available. The fastest way to get your results is to activate your My Chart account. Instructions are located on the last page of this paperwork. If you have not heard from us regarding the results in 2 weeks, please contact this office.      

## 2016-09-12 NOTE — Telephone Encounter (Signed)
Do not bill 

## 2016-09-13 ENCOUNTER — Encounter: Payer: Medicare Other | Admitting: Gastroenterology

## 2016-09-14 ENCOUNTER — Encounter (HOSPITAL_COMMUNITY): Payer: Self-pay | Admitting: Psychiatry

## 2016-09-14 ENCOUNTER — Ambulatory Visit (INDEPENDENT_AMBULATORY_CARE_PROVIDER_SITE_OTHER): Payer: Medicare Other | Admitting: Psychiatry

## 2016-09-14 VITALS — BP 122/70 | HR 106 | Ht 61.0 in | Wt 131.0 lb

## 2016-09-14 DIAGNOSIS — G4700 Insomnia, unspecified: Secondary | ICD-10-CM

## 2016-09-14 DIAGNOSIS — F5101 Primary insomnia: Secondary | ICD-10-CM | POA: Diagnosis not present

## 2016-09-14 NOTE — Progress Notes (Signed)
Psychiatric Initial Adult Assessment   Patient Identification: DEDEE LISS MRN:  [id number] Date of Evaluation:  09/14/2016 Referral Source: Dr. [name] Chief Complaint:   Visit Diagnosis: No diagnosis found.  History of Present Illness:   This patient is a 81 year old white married female who has no significant psychiatric history. She's here because of complaints of being extremely sleepy and complaining of interrupted sleep. She essentially has insomnia in that she falls asleep without a problem but wakes 2 or 3 hours later and simply cannot get back to sleep. Occasionally she'll get another hour or 2 but rarely. The patient is very sleepy during the day. She does not take naps. Her sleepiness affects her functioning day-to-day. The patient denies daily depression. Her sleep disturbances been present for years. She's had a sleep study in the past that failed to show any evidence of sleep apnea. The patient is eating well and has good concentration. She denies worthlessness. She's not suicidal now and never has been. The patient denies the use of alcohol. She's never had psychotic symptoms. The patient denies ever having persistent daily depression for more than a week. She denies any symptoms consistent with mania. She denies symptoms consistent with generalized anxiety disorder, panic disorder, OCD. The patient is happily married for over 63 years. Her husband lives in a nursing home at this time. She has a good relationship with him. Approximately year ago however the patient's son died suddenly of an MI. The patient has a daughter is happily married. The patient has 5 grandchildren all of whom are doing well. The patient is biggest worry is finances. Generally though the patient feels she is fairly healthy. She is diet-controlled diabetes, hypertension and hypercholesterolemia withdrawal well-controlled medications. The patient denies any pain. She does take some pain medications occasionally for  some back pain but this pain process does not afflict her sleep. It is noted the patient does sleep with her cats around her but they do not awaken her. The patient enjoys the television reading and generally has no evidence of major depression. The patient is never been in a psychiatric hospital nor she ever been evaluated by a psychiatrist. The patient does take 25 mg of Zoloft for reasons that are not clear. She also is been taking trazodone 50 mg with little benefit. She also takes Remeron 15 mg yet the reason she is taking his antidepressants is not clear. I suspect the Remeron vitamin given to help her sleep. It is evident that everything she is doing his failed. This patient is not suicidal and she functions fairly well. She spends all day with her husband at the nursing home. The patient is generally is active she can be despite the fact that she's quite sleepy. She's had a sleep disturbance for many years. It is very frustrating to her. The patient has not fallen and feels healthy.  Associated Signs/Symptoms: Depression Symptoms:  insomnia, (Hypo) Manic Symptoms:   Anxiety Symptoms:   Psychotic Symptoms:   PTSD Symptoms:   Past Psychiatric History: Negative  Previous Psychotropic Medications: Yes   Substance Abuse History in the last 12 months:  No.  Consequences of Substance Abuse: NA  Past Medical History:  Past Medical History:  Diagnosis Date  . Arthritis   . Azotemia   . Carotid artery disease ([place])   . Claudication ([place])   . CVA (cerebral vascular accident) ([place])    [place] DIZZINESS 2008  . Diabetes mellitus    1990  . Essential hypertension, benign   .  History of anemia   . History of gout   . Hyperlipidemia   . Neck pain   . PAD (peripheral artery disease) (HCC)    hx. LCEA, hx Lt renal art. stenting, occl rt renal artery, occluded bil SFAs, moderatie iliac disease,    . Renal cell cancer ([place]) 2001  . Seizure [facility])     Past Surgical History:  Procedure  Laterality Date  . CAROTID ENDARTERECTOMY     LEFT  . NEPHRECTOMY     RIGHT  . NM MYOCAR PERF WALL MOTION  07/12/2011   normal  . PV angiogram  2004   Lt renal artery stent  . URETERAL STENT PLACEMENT     LEFT    Family Psychiatric History:   Family History:  Family History  Problem Relation Age of Onset  . Cancer Mother   . Heart disease Father     Social History:   Social History   Social History  . Marital status: Married    Spouse name: N/A  . Number of children: 2  . Years of education: N/A   Occupational History  . Retired Retired   Social History Main Topics  . Smoking status: Former Smoker    Quit date: 12/26/2005  . Smokeless tobacco: Never Used  . Alcohol use No  . Drug use: No  . Sexual activity: No   Other Topics Concern  . None   Social History Narrative  . None    Additional Social History:   Allergies:   Allergies  Allergen Reactions  . Codeine Nausea And Vomiting  . Coreg [Carvedilol] Nausea And Vomiting  . Fentanyl   . Hycodan [Hydrocodone-Homatropine] Itching  . Lacosamide      Other name is, VIMPAT  . Levetiracetam Other (See Comments)    Strange feelings in head  . Metformin And Related   . Metoprolol   . Morphine And Related Itching  . Sertraline Nausea Only and Other (See Comments)    Swelling in mouth  . Tessalon [Benzonatate]   . Other Rash    BETA BLOCKER    Metabolic Disorder Labs: Lab Results  Component Value Date   HGBA1C 6.3 09/12/2016   MPG 128 (H) 07/30/2010   No results found for: PROLACTIN Lab Results  Component Value Date   CHOL 145 05/03/2016   TRIG 124 05/03/2016   HDL 61 05/03/2016   CHOLHDL 2.4 05/03/2016   VLDL 25 05/03/2016   LDLCALC 59 05/03/2016   LDLCALC 66 08/14/2013     Current Medications: Current Outpatient Prescriptions  Medication Sig Dispense Refill  . amLODipine (NORVASC) 5 MG tablet Take 1 tablet (5 mg total) by mouth daily. 90 tablet 3  . aspirin 81 MG tablet Take 81 mg by  mouth daily.    Marland Kitchen atorvastatin (LIPITOR) 20 MG tablet Take 1 tablet (20 mg total) by mouth daily. 90 tablet 3  . clopidogrel (PLAVIX) 75 MG tablet Take 1 tablet (75 mg total) by mouth daily. 90 tablet 2  . ferrous sulfate 325 (65 FE) MG tablet Take 1 tablet (325 mg total) by mouth daily with breakfast. 30 tablet 3  . glipiZIDE (GLUCOTROL) 5 MG tablet Take 0.5 tablets (2.5 mg total) by mouth daily. 45 tablet 3  . HYDROcodone-acetaminophen (NORCO) 10-325 MG per tablet Take 1 tablet by mouth every 6 (six) hours as needed for severe pain.    Marland Kitchen lisinopril (PRINIVIL,ZESTRIL) 40 MG tablet Take 1 tablet (40 mg total) by mouth daily. 90 tablet 3  .  mirtazapine (REMERON) 15 MG tablet Take 1 tablet (15 mg total) by mouth at bedtime. 30 tablet 5  . OVER THE COUNTER MEDICATION Take 1 capsule by mouth daily. Unknown OTC medication for urinary frequency.    . pravastatin (PRAVACHOL) 40 MG tablet Take 40 mg by mouth daily.    . sertraline (ZOLOFT) 25 MG tablet Take 1 tablet (25 mg total) by mouth daily. 90 tablet 3  . traZODone (DESYREL) 50 MG tablet Take 1 tablet (50 mg total) by mouth at bedtime. 30 tablet 5   No current facility-administered medications for this visit.     Neurologic: Headache: No Seizure: No Paresthesias:NA  Musculoskeletal: Strength & Muscle Tone: Normal Gait & Station: Normal Patient leans: N/A  Psychiatric Specialty Exam: ROS  Blood pressure 122/70, pulse (!) 106, height 5\' 1"  (1.549 m), weight 131 lb (59.4 kg).Body mass index is 24.75 kg/m.  General Appearance: Well Groomed  Eye Contact:  Good  Speech:  Clear and Coherent  Volume:  Normal  Mood:  Euthymic  Affect:  Appropriate  Thought Process:  Goal Directed  Orientation:  NA  Thought Content:  Logical  Suicidal Thoughts:  No  Homicidal Thoughts:  No  Memory:  NA  Judgement:  Good  Insight:  Good  Psychomotor Activity:  Normal  Concentration:  Concentration: Good  Recall:  Good  Fund of Knowledge:Good   Language: Negative  Akathisia:  No  Handed:  Right  AIMS (if indicated):    Assets:  Desire for Improvement  ADL's:  Intact  Cognition: WNL  Sleep:  Abnormal , problems maintaining     Treatment Plan Summary: Medication management At this time the patient has a primary insomnia disorder. She's been on Ambien in the past which worked fairly well. She's been frightened by a very stopped her to set this up problems sleep walking. The reality she took it for years and never sleepwalked I doubt she would sleep well. But I appreciate her anxiety. Instead of using Ambien for this patient's primary sleep disorder we'll go ahead and give her Restoril 15 mg. She'll return to see me in approximately 9 weeks. So her essential problem is that of primary insomnia. She received education about good sleep hygiene.  Haskel Schroeder, MD 9/20/20173:03 PM

## 2016-09-29 ENCOUNTER — Ambulatory Visit (INDEPENDENT_AMBULATORY_CARE_PROVIDER_SITE_OTHER): Payer: Medicare Other | Admitting: Physician Assistant

## 2016-09-29 VITALS — BP 136/74 | HR 110 | Temp 98.6°F | Resp 18 | Ht 61.0 in | Wt 133.0 lb

## 2016-09-29 DIAGNOSIS — F4322 Adjustment disorder with anxiety: Secondary | ICD-10-CM | POA: Diagnosis not present

## 2016-09-29 DIAGNOSIS — G479 Sleep disorder, unspecified: Secondary | ICD-10-CM | POA: Diagnosis not present

## 2016-09-29 DIAGNOSIS — L853 Xerosis cutis: Secondary | ICD-10-CM | POA: Diagnosis not present

## 2016-09-29 MED ORDER — SUVOREXANT 10 MG PO TABS: 10.0000 mg | ORAL_TABLET | Freq: Every day | ORAL | 0 refills | Status: DC

## 2016-09-29 MED ORDER — CITALOPRAM HYDROBROMIDE 10 MG PO TABS: 10.0000 mg | ORAL_TABLET | Freq: Every day | ORAL | 0 refills | Status: DC

## 2016-09-29 NOTE — Patient Instructions (Addendum)
Stop taking Trazodone.  Start taking Celexa and Belsomra.  Please return to clinic in three weeks to make sure these medications are working well for you. We can adjust accordingly.  Thank you for coming in today. I hope you feel we met your needs.  Feel free to call [facility] if you have any questions or further requests.  Please consider signing up for MyChart if you do not already have it, as this is a great way to communicate with me.  Best,  Whitney McVey, PA-C   IF you received an x-ray today, you will receive an invoice from [facility]. Please contact [facility] at [phone] with questions or concerns regarding your invoice.   IF you received labwork today, you will receive an invoice from [facility]. Please contact [facility] at [phone] with questions or concerns regarding your invoice.   Our billing staff will not be able to assist you with questions regarding bills from these companies.  You will be contacted with the lab results as soon as they are available. The fastest way to get your results is to activate your My Chart account. Instructions are located on the last page of this paperwork. If you have not heard from us regarding the results in 2 weeks, please contact this office.     

## 2016-09-29 NOTE — Progress Notes (Signed)
Gabrielle [name]  MRN: [id number] DOB: [birth year]  PCP: [name], MD  Subjective:  Pt is a pleasant 81 year old female, history of hypertension, CVA, CAD, diabetes, anemia and insomnia, who presents to clinic for face peeling x a few days.   Dry Skin - She has a red patch with dry, flaky skin all across her forehead. Dry flakiness on her cheeks and chin. She notes her skin itches some, but not badly. Denies pain and irritating. She mainly feels it is unsightly. Has tried lotion.  Her husband, whom she has been with over 59 years, is in a nursing home x one year. She spends her days with him and goes home at night to sleep. Of note her husband has little "white heads" all along his forehead.  No new lotions, make-up products, clothes, towels, soaps.  She notes a new prescription of Temazepam, 09/14/2016.   Difficulty sleeping - She reports several months of difficulty sleeping. She has seen a sleep specialist and received treatment here at [facility]. She has tried Melatonin, Ambien 10 mg, Trazodone 50 mg, Mirtazapine 15 mg, Unison 25 mg. Ambien is the only medication that worked, however she was discontinued from this by her doctor who told her it has bad side effects for older people.   Review of Systems  Constitutional: Negative for chills, fever and unexpected weight change.  Eyes: Negative for redness and itching.  Respiratory: Negative for cough, shortness of breath and wheezing.   Cardiovascular: Negative for chest pain and palpitations.  Skin: Positive for rash.  Allergic/Immunologic: Negative for environmental allergies and food allergies.  Neurological: Negative for weakness, light-headedness and headaches.  Psychiatric/Behavioral: Positive for sleep disturbance. The patient is nervous/anxious.     Patient Active Problem List   Diagnosis Date Noted  . Iron deficiency anemia 07/08/2016  . Chronic insomnia 12/10/2015  . Spinal stenosis of lumbar region 05/21/2014  . Carotid  artery disease ([place]) 02/26/2014  . PAD (peripheral artery disease) ([place])   . Renal cell cancer ([place])   . Diabetes mellitus   . Essential hypertension, benign   . Hyperlipidemia   . History of anemia   . CVA (cerebral vascular accident) [facility])     Current Outpatient Prescriptions on File Prior to Visit  Medication Sig Dispense Refill  . amLODipine (NORVASC) 5 MG tablet Take 1 tablet (5 mg total) by mouth daily. 90 tablet 3  . aspirin 81 MG tablet Take 81 mg by mouth daily.    Marland Kitchen atorvastatin (LIPITOR) 20 MG tablet Take 1 tablet (20 mg total) by mouth daily. 90 tablet 3  . clopidogrel (PLAVIX) 75 MG tablet Take 1 tablet (75 mg total) by mouth daily. 90 tablet 2  . HYDROcodone-acetaminophen (NORCO) 10-325 MG per tablet Take 1 tablet by mouth every 6 (six) hours as needed for severe pain.    Marland Kitchen lisinopril (PRINIVIL,ZESTRIL) 40 MG tablet Take 1 tablet (40 mg total) by mouth daily. 90 tablet 3  . ferrous sulfate 325 (65 FE) MG tablet Take 1 tablet (325 mg total) by mouth daily with breakfast. 30 tablet 3   No current facility-administered medications on file prior to visit.     Allergies  Allergen Reactions  . Codeine Nausea And Vomiting  . Coreg [Carvedilol] Nausea And Vomiting  . Fentanyl   . Lacosamide      Other name is, VIMPAT  . Levetiracetam Other (See Comments)    Strange feelings in head  . Metformin And Related   .  Metoprolol   . Morphine And Related Itching  . Oxybutynin Itching  . Sertraline Nausea Only and Other (See Comments)    Swelling in mouth  . Tessalon [Benzonatate]   . Other Rash    BETA BLOCKER    Objective:  BP 136/74 (BP Location: Right Arm, Patient Position: Sitting, Cuff Size: Small)   Pulse (!) 110   Temp 98.6 F (37 C) (Oral)   Resp 18   Ht 5\' 1"  (1.549 m)   Wt 133 lb (60.3 kg)   SpO2 98%   BMI 25.13 kg/m   Physical Exam  Constitutional: She is oriented to person, place, and time and well-developed, well-nourished, and in no distress. No  distress.  Cardiovascular: Normal rate, regular rhythm and normal heart sounds.   Pulmonary/Chest: Effort normal. No respiratory distress.  Neurological: She is alert and oriented to person, place, and time. GCS score is 15.  Skin: Skin is warm and dry.  Dry, scaly desquamination of skin on erythematous base covering entirety of forehead. Minor desquamination along b/l sides of her face and mental protuberance. No excoriations, fissures, plaques appreciated.   Psychiatric: Mood, memory, affect and judgment normal.  Vitals reviewed.   Assessment and Plan :  1. Difficulty sleeping 2. Adjustment disorder with anxiety 3. Dry Skin  - Informed patient of plan to discontinue Trazodone, as this could be the cause of her dry, scaly skin.  - Discussed with patient her difficulty with sleep more than likely stems from her feelings surrounding the situation of her husband of 60+ years now living in a nursing home. She understands and agrees.  - Plan: Start Celexa 10mg  to help decrease anxiety symptoms at bedtime. Also start Belsomra 10 mg to aid with sleep.  She will check with insurance about Belsomra coverage. RTC in 3 weeks to discuss new medications. She understands plan and will RTC.  - Suvorexant (BELSOMRA) 10 MG TABS; Take 10 mg by mouth at bedtime.  Dispense: 30 tablet; Refill: 0 - citalopram (CELEXA) 10 MG tablet; Take 1 tablet (10 mg total) by mouth daily.  Dispense: 30 tablet; Refill: 0  Mercer Pod, PA-C  [facility] 09/29/2016 3:38 PM

## 2016-10-05 ENCOUNTER — Telehealth: Payer: Self-pay

## 2016-10-05 NOTE — Telephone Encounter (Signed)
PA completed on covermymeds for Belsomra. Pt reports following at OV: She reports several months of difficulty sleeping. She has seen a sleep specialist and received treatment here at [facility]. She has tried Melatonin, Ambien 10 mg, Trazodone 50 mg, Mirtazapine 15 mg, Unison 25 mg. Ambien is the only medication that worked, however she was discontinued from this by her doctor who told her it has bad side effects for older people. PA pending.

## 2016-10-07 NOTE — Telephone Encounter (Signed)
PA approved until further notice. Notified pharm.

## 2016-10-24 ENCOUNTER — Telehealth: Payer: Self-pay | Admitting: Gastroenterology

## 2016-10-24 NOTE — Telephone Encounter (Signed)
This is her second last minute cancellation.  No charge this time but if she does this again, she will be charged and rerouted to office appt before scheduling another time for colonoscopy.

## 2016-10-25 ENCOUNTER — Encounter: Payer: Medicare Other | Admitting: Gastroenterology

## 2016-11-02 DIAGNOSIS — L570 Actinic keratosis: Secondary | ICD-10-CM | POA: Diagnosis not present

## 2016-11-02 DIAGNOSIS — L308 Other specified dermatitis: Secondary | ICD-10-CM | POA: Diagnosis not present

## 2016-11-02 DIAGNOSIS — L723 Sebaceous cyst: Secondary | ICD-10-CM | POA: Diagnosis not present

## 2016-11-02 DIAGNOSIS — L680 Hirsutism: Secondary | ICD-10-CM | POA: Diagnosis not present

## 2016-11-03 DIAGNOSIS — R351 Nocturia: Secondary | ICD-10-CM | POA: Diagnosis not present

## 2016-12-06 DIAGNOSIS — N3281 Overactive bladder: Secondary | ICD-10-CM | POA: Diagnosis not present

## 2016-12-14 ENCOUNTER — Encounter (HOSPITAL_COMMUNITY): Payer: Self-pay | Admitting: Psychiatry

## 2016-12-14 ENCOUNTER — Ambulatory Visit (INDEPENDENT_AMBULATORY_CARE_PROVIDER_SITE_OTHER): Payer: Medicare Other | Admitting: Psychiatry

## 2016-12-14 VITALS — BP 160/90 | HR 41 | Ht 61.0 in | Wt 135.4 lb

## 2016-12-14 DIAGNOSIS — F5104 Psychophysiologic insomnia: Secondary | ICD-10-CM

## 2016-12-14 DIAGNOSIS — Z808 Family history of malignant neoplasm of other organs or systems: Secondary | ICD-10-CM

## 2016-12-14 DIAGNOSIS — Z9889 Other specified postprocedural states: Secondary | ICD-10-CM | POA: Diagnosis not present

## 2016-12-14 DIAGNOSIS — Z8249 Family history of ischemic heart disease and other diseases of the circulatory system: Secondary | ICD-10-CM | POA: Diagnosis not present

## 2016-12-14 DIAGNOSIS — Z79899 Other long term (current) drug therapy: Secondary | ICD-10-CM

## 2016-12-14 DIAGNOSIS — Z888 Allergy status to other drugs, medicaments and biological substances status: Secondary | ICD-10-CM

## 2016-12-14 DIAGNOSIS — Z87891 Personal history of nicotine dependence: Secondary | ICD-10-CM

## 2016-12-14 MED ORDER — TEMAZEPAM 15 MG PO CAPS: ORAL_CAPSULE | ORAL | 4 refills | Status: DC

## 2016-12-14 NOTE — Progress Notes (Signed)
Psychiatric Initial Adult Assessment   Patient Identification: Gabrielle [name] MRN:  [id number] Date of Evaluation:  12/14/2016 Referral Source: Dr. [name] Chief Complaint:   Visit Diagnosis: No diagnosis found.  History of Present Illness:   At this time the patient is doing better. She is sleeping longer. On the other hand it is now evident that she has a bladder disorder. She finds herself waking up To urinate multiple times through the night. This is clearly an external factor is affecting her sleep. As result she is very sleepy. She does claim that the Restoril is in fact helpful. The patient does not take naps. Her mood is stable. She has an appointment this afternoon with the urologist try to get some medicines for her bladder spasm problem. Today I shared with her the importance of getting up slow and sitting on the end of the bed before she walks. She is interested in increasing the dose to the full dose of Restoril taking 30 mg and will do just that. I shared with her my concern that she would get dizzy and fall but she seems not to have that effect from the 15 mg of the Restoril. Whenever the patient does she'll do it slowly. The ideal scenario is to get her bladder treated. The patient is bothered of course by her husband being in a nursing home and is not doing well. He doesn't seem though to impact her sleep pattern. Depression Symptoms:  insomnia, (Hypo) Manic Symptoms:   Anxiety Symptoms:   Psychotic Symptoms:   PTSD Symptoms:   Past Psychiatric History: Negative  Previous Psychotropic Medications: Yes   Substance Abuse History in the last 12 months:  No.  Consequences of Substance Abuse: NA  Past Medical History:  Past Medical History:  Diagnosis Date  . Arthritis   . Azotemia   . Carotid artery disease ([place])   . Claudication ([place])   . CVA (cerebral vascular accident) ([place])    [place] DIZZINESS 2008  . Diabetes mellitus    1990  . Essential hypertension, benign   .  History of anemia   . History of gout   . Hyperlipidemia   . Neck pain   . PAD (peripheral artery disease) (HCC)    hx. LCEA, hx Lt renal art. stenting, occl rt renal artery, occluded bil SFAs, moderatie iliac disease,    . Renal cell cancer ([place]) 2001  . Seizure [facility])     Past Surgical History:  Procedure Laterality Date  . CAROTID ENDARTERECTOMY     LEFT  . NEPHRECTOMY     RIGHT  . NM MYOCAR PERF WALL MOTION  07/12/2011   normal  . PV angiogram  2004   Lt renal artery stent  . URETERAL STENT PLACEMENT     LEFT    Family Psychiatric History:   Family History:  Family History  Problem Relation Age of Onset  . Cancer Mother   . Heart disease Father     Social History:   Social History   Social History  . Marital status: Married    Spouse name: N/A  . Number of children: 2  . Years of education: N/A   Occupational History  . Retired Retired   Social History Main Topics  . Smoking status: Former Smoker    Quit date: 12/26/2005  . Smokeless tobacco: Never Used  . Alcohol use No  . Drug use: No  . Sexual activity: No   Other Topics Concern  . None  Social History Narrative  . None    Additional Social History:   Allergies:   Allergies  Allergen Reactions  . Codeine Nausea And Vomiting  . Coreg [Carvedilol] Nausea And Vomiting  . Fentanyl   . Lacosamide      Other name is, VIMPAT  . Levetiracetam Other (See Comments)    Strange feelings in head  . Metformin And Related   . Metoprolol   . Morphine And Related Itching  . Oxybutynin Itching  . Sertraline Nausea Only and Other (See Comments)    Swelling in mouth  . Tessalon [Benzonatate]   . Other Rash    BETA BLOCKER    Metabolic Disorder Labs: Lab Results  Component Value Date   HGBA1C 6.3 09/12/2016   MPG 128 (H) 07/30/2010   No results found for: PROLACTIN Lab Results  Component Value Date   CHOL 145 05/03/2016   TRIG 124 05/03/2016   HDL 61 05/03/2016   CHOLHDL 2.4 05/03/2016    VLDL 25 05/03/2016   LDLCALC 59 05/03/2016   LDLCALC 66 08/14/2013     Current Medications: Current Outpatient Prescriptions  Medication Sig Dispense Refill  . amLODipine (NORVASC) 5 MG tablet Take 1 tablet (5 mg total) by mouth daily. 90 tablet 3  . aspirin 81 MG tablet Take 81 mg by mouth daily.    Marland Kitchen atorvastatin (LIPITOR) 20 MG tablet Take 1 tablet (20 mg total) by mouth daily. 90 tablet 3  . citalopram (CELEXA) 10 MG tablet Take 1 tablet (10 mg total) by mouth daily. 30 tablet 0  . clopidogrel (PLAVIX) 75 MG tablet Take 1 tablet (75 mg total) by mouth daily. 90 tablet 2  . ferrous sulfate 325 (65 FE) MG tablet Take 1 tablet (325 mg total) by mouth daily with breakfast. 30 tablet 3  . HYDROcodone-acetaminophen (NORCO) 10-325 MG per tablet Take 1 tablet by mouth every 6 (six) hours as needed for severe pain.    Marland Kitchen lisinopril (PRINIVIL,ZESTRIL) 40 MG tablet Take 1 tablet (40 mg total) by mouth daily. 90 tablet 3  . Pumpkin Seed-Soy Germ (AZO BLADDER CONTROL/GO-LESS PO) Take by mouth.    . Suvorexant (BELSOMRA) 10 MG TABS Take 10 mg by mouth at bedtime. 30 tablet 0  . temazepam (RESTORIL) 15 MG capsule 1 qhs may repeat 60 capsule 4   No current facility-administered medications for this visit.     Neurologic: Headache: No Seizure: No Paresthesias:NA  Musculoskeletal: Strength & Muscle Tone: Normal Gait & Station: Normal Patient leans: N/A  Psychiatric Specialty Exam: ROS  Blood pressure (!) 160/90, pulse (!) 41, height 5\' 1"  (1.549 m), weight 135 lb 6.4 oz (61.4 kg).Body mass index is 25.58 kg/m.  General Appearance: Well Groomed  Eye Contact:  Good  Speech:  Clear and Coherent  Volume:  Normal  Mood:  Euthymic  Affect:  Appropriate  Thought Process:  Goal Directed  Orientation:  NA  Thought Content:  Logical  Suicidal Thoughts:  No  Homicidal Thoughts:  No  Memory:  NA  Judgement:  Good  Insight:  Good  Psychomotor Activity:  Normal  Concentration:  Concentration:  Good  Recall:  Good  Fund of Knowledge:Good  Language: Negative  Akathisia:  No  Handed:  Right  AIMS (if indicated):    Assets:  Desire for Improvement  ADL's:  Intact  Cognition: WNL  Sleep:  Abnormal , problems maintaining     Treatment Plan Summary: Medication management 12/20/20172:43 PM   Today once again we  reviewed good sleep hygiene. I've asked to do nothing in the bed but sleep. I've asked her not to take any caffeine after 3:00 in the afternoon. Today we'll go ahead and increase her Restoril to taking 15 mg 1 or 2 at night. She'll the option to take 2. Second will be seen by her urologist. Hopefully she can get her bladder under control taking one or 2 Restoril is for help her sleep through the night. She still has dysfunction during the day because of not sleeping well at night. She will return to see me in 2 months for a 15 minute visit.

## 2016-12-30 ENCOUNTER — Other Ambulatory Visit: Payer: Self-pay | Admitting: Family Medicine

## 2016-12-30 NOTE — Telephone Encounter (Signed)
SS req for refill Halcion Do not see this med on her list. She did not return in 2 weeks as noted on visit Please advise.

## 2017-01-01 NOTE — Telephone Encounter (Signed)
Will not refill this Rx. She saw Dr. Casimiro Needle on 12/14/2016, was Rx Restoril #60 and has 4 refills. She has f/u appt with him next month. Dr. Casimiro Needle should manage these medications for her. Thank you

## 2017-01-02 NOTE — Telephone Encounter (Signed)
LMOVM per Whitney's note.

## 2017-02-15 ENCOUNTER — Encounter (HOSPITAL_COMMUNITY): Payer: Self-pay | Admitting: Psychiatry

## 2017-02-15 ENCOUNTER — Ambulatory Visit (INDEPENDENT_AMBULATORY_CARE_PROVIDER_SITE_OTHER): Payer: Medicare Other | Admitting: Psychiatry

## 2017-02-15 VITALS — BP 148/70 | HR 74 | Ht 61.0 in | Wt 139.8 lb

## 2017-02-15 DIAGNOSIS — Z809 Family history of malignant neoplasm, unspecified: Secondary | ICD-10-CM

## 2017-02-15 DIAGNOSIS — Z888 Allergy status to other drugs, medicaments and biological substances status: Secondary | ICD-10-CM

## 2017-02-15 DIAGNOSIS — Z79899 Other long term (current) drug therapy: Secondary | ICD-10-CM | POA: Diagnosis not present

## 2017-02-15 DIAGNOSIS — Z9889 Other specified postprocedural states: Secondary | ICD-10-CM

## 2017-02-15 DIAGNOSIS — Z7982 Long term (current) use of aspirin: Secondary | ICD-10-CM | POA: Diagnosis not present

## 2017-02-15 DIAGNOSIS — Z8249 Family history of ischemic heart disease and other diseases of the circulatory system: Secondary | ICD-10-CM | POA: Diagnosis not present

## 2017-02-15 DIAGNOSIS — F5104 Psychophysiologic insomnia: Secondary | ICD-10-CM | POA: Diagnosis not present

## 2017-02-15 DIAGNOSIS — E119 Type 2 diabetes mellitus without complications: Secondary | ICD-10-CM | POA: Diagnosis not present

## 2017-02-15 DIAGNOSIS — Z87891 Personal history of nicotine dependence: Secondary | ICD-10-CM | POA: Diagnosis not present

## 2017-02-15 MED ORDER — ZOLPIDEM TARTRATE ER 6.25 MG PO TBCR: 6.2500 mg | EXTENDED_RELEASE_TABLET | Freq: Every evening | ORAL | 5 refills | Status: DC | PRN

## 2017-02-15 NOTE — Progress Notes (Signed)
Psychiatric Initial Adult Assessment   Patient Identification: Gabrielle [name] MRN:  [id number] Date of Evaluation:  02/15/2017 Referral Source: Dr. [name] Chief Complaint:   Visit Diagnosis: No diagnosis found.  History of Present Illne today the patient is seen on time. The patient continues to have problems sleeping at night. Increasing her Restoril 2 pills has helped given her about one to 2 more hours. Nonetheless she still feels that she does not sleep. Reality she gets about 4 hours of sleep at this time. She feels sleepy side of the time but she never takes a nap. Her bladder spasms have been taking care of. She's had good urological treatment and has no longer issue. She wakes up every morning 4:00 in changing it back to sleep. Is very bothersome to her. Finally patient shared the that years ago she was taking Ambien and slept very well. She got normal so her sleep. She says that he is changed to she is released that she's never trial. Her doctor took her off the because of years that she sleepwalk. The patient is never sleepwalk. The patient's mood is fairly good. She's eating well. She spends all day with her husband [name] at a nursing home. Patient is not sure she's taking melatonin. Depression Symptoms:  insomnia, (Hypo) Manic Symptoms:   Anxiety Symptoms:   Psychotic Symptoms:   PTSD Symptoms:   Past Psychiatric History: Negative  Previous Psychotropic Medications: Yes   Substance Abuse History in the last 12 months:  No.  Consequences of Substance Abuse: NA  Past Medical History:  Past Medical History:  Diagnosis Date  . Arthritis   . Azotemia   . Carotid artery disease ([place])   . Claudication ([place])   . CVA (cerebral vascular accident) ([place])    [place] DIZZINESS 2008  . Diabetes mellitus    1990  . Essential hypertension, benign   . History of anemia   . History of gout   . Hyperlipidemia   . Neck pain   . PAD (peripheral artery disease) (HCC)    hx. LCEA, hx Lt  renal art. stenting, occl rt renal artery, occluded bil SFAs, moderatie iliac disease,    . Renal cell cancer ([place]) 2001  . Seizure [facility])     Past Surgical History:  Procedure Laterality Date  . CAROTID ENDARTERECTOMY     LEFT  . NEPHRECTOMY     RIGHT  . NM MYOCAR PERF WALL MOTION  07/12/2011   normal  . PV angiogram  2004   Lt renal artery stent  . URETERAL STENT PLACEMENT     LEFT    Family Psychiatric History:   Family History:  Family History  Problem Relation Age of Onset  . Cancer Mother   . Heart disease Father     Social History:   Social History   Social History  . Marital status: Married    Spouse name: N/A  . Number of children: 2  . Years of education: N/A   Occupational History  . Retired Retired   Social History Main Topics  . Smoking status: Former Smoker    Quit date: 12/26/2005  . Smokeless tobacco: Never Used  . Alcohol use No  . Drug use: No  . Sexual activity: No   Other Topics Concern  . None   Social History Narrative  . None    Additional Social History:   Allergies:   Allergies  Allergen Reactions  . Codeine Nausea And Vomiting  . Coreg [Carvedilol]  Nausea And Vomiting  . Fentanyl   . Lacosamide      Other name is, VIMPAT  . Levetiracetam Other (See Comments)    Strange feelings in head  . Metformin And Related   . Metoprolol   . Morphine And Related Itching  . Oxybutynin Itching  . Sertraline Nausea Only and Other (See Comments)    Swelling in mouth  . Tessalon [Benzonatate]   . Other Rash    BETA BLOCKER    Metabolic Disorder Labs: Lab Results  Component Value Date   HGBA1C 6.3 09/12/2016   MPG 128 (H) 07/30/2010   No results found for: PROLACTIN Lab Results  Component Value Date   CHOL 145 05/03/2016   TRIG 124 05/03/2016   HDL 61 05/03/2016   CHOLHDL 2.4 05/03/2016   VLDL 25 05/03/2016   LDLCALC 59 05/03/2016   LDLCALC 66 08/14/2013     Current Medications: Current Outpatient Prescriptions   Medication Sig Dispense Refill  . amLODipine (NORVASC) 5 MG tablet Take 1 tablet (5 mg total) by mouth daily. 90 tablet 3  . aspirin 81 MG tablet Take 81 mg by mouth daily.    Marland Kitchen atorvastatin (LIPITOR) 20 MG tablet Take 1 tablet (20 mg total) by mouth daily. 90 tablet 3  . citalopram (CELEXA) 10 MG tablet Take 1 tablet (10 mg total) by mouth daily. 30 tablet 0  . clopidogrel (PLAVIX) 75 MG tablet Take 1 tablet (75 mg total) by mouth daily. 90 tablet 2  . ferrous sulfate 325 (65 FE) MG tablet Take 1 tablet (325 mg total) by mouth daily with breakfast. 30 tablet 3  . HYDROcodone-acetaminophen (NORCO) 10-325 MG per tablet Take 1 tablet by mouth every 6 (six) hours as needed for severe pain.    Marland Kitchen lisinopril (PRINIVIL,ZESTRIL) 40 MG tablet Take 1 tablet (40 mg total) by mouth daily. 90 tablet 3  . Pumpkin Seed-Soy Germ (AZO BLADDER CONTROL/GO-LESS PO) Take by mouth.    . Suvorexant (BELSOMRA) 10 MG TABS Take 10 mg by mouth at bedtime. 30 tablet 0  . temazepam (RESTORIL) 15 MG capsule 1 qhs may repeat 60 capsule 4  . zolpidem (AMBIEN CR) 6.25 MG CR tablet Take 1 tablet (6.25 mg total) by mouth at bedtime as needed for sleep. 30 tablet 5   No current facility-administered medications for this visit.     Neurologic: Headache: No Seizure: No Paresthesias:NA  Musculoskeletal: Strength & Muscle Tone: Normal Gait & Station: Normal Patient leans: N/A  Psychiatric Specialty Exam: ROS  Blood pressure (!) 148/70, pulse 74, height 5\' 1"  (1.549 m), weight 139 lb 12.8 oz (63.4 kg).Body mass index is 26.41 kg/m.  General Appearance: Well Groomed  Eye Contact:  Good  Speech:  Clear and Coherent  Volume:  Normal  Mood:  Euthymic  Affect:  Appropriate  Thought Process:  Goal Directed  Orientation:  NA  Thought Content:  Logical  Suicidal Thoughts:  No  Homicidal Thoughts:  No  Memory:  NA  Judgement:  Good  Insight:  Good  Psychomotor Activity:  Normal  Concentration:  Concentration: Good   Recall:  Good  Fund of Knowledge:Good  Language: Negative  Akathisia:  No  Handed:  Right  AIMS (if indicated):    Assets:  Desire for Improvement  ADL's:  Intact  Cognition: WNL  Sleep:  Abnormal , problems maintaining     Treatment Plan Summary: Medication management 2/21/20183:44 PM   At this time we will asked the patient to either  take 2 Restoril is as she's been or taking Ambien 6.25 mg CR. She'll confirm that she is trying to taking melatonin she's not to begin taking 3 mg every day. Again we went over good sleep times and she seems to be doing. The patient return to see me in 2 months for a 15 minute visit. The possibility of increasing the highest dose of 12.5 mg CR will be considered at that time. Again I asked the patient not taking Restoril at this time.

## 2017-03-10 ENCOUNTER — Emergency Department (HOSPITAL_COMMUNITY): Payer: Medicare Other

## 2017-03-10 ENCOUNTER — Emergency Department (HOSPITAL_COMMUNITY)
Admission: EM | Admit: 2017-03-10 | Discharge: 2017-03-10 | Disposition: A | Discharge status: Home / Self Care | Payer: Medicare Other | Attending: Emergency Medicine | Admitting: Emergency Medicine

## 2017-03-10 ENCOUNTER — Encounter (HOSPITAL_COMMUNITY): Payer: Self-pay

## 2017-03-10 DIAGNOSIS — K529 Noninfective gastroenteritis and colitis, unspecified: Secondary | ICD-10-CM | POA: Insufficient documentation

## 2017-03-10 DIAGNOSIS — Z87891 Personal history of nicotine dependence: Secondary | ICD-10-CM | POA: Diagnosis not present

## 2017-03-10 DIAGNOSIS — R200 Anesthesia of skin: Secondary | ICD-10-CM | POA: Insufficient documentation

## 2017-03-10 DIAGNOSIS — I10 Essential (primary) hypertension: Secondary | ICD-10-CM | POA: Insufficient documentation

## 2017-03-10 DIAGNOSIS — R42 Dizziness and giddiness: Secondary | ICD-10-CM | POA: Diagnosis not present

## 2017-03-10 DIAGNOSIS — Z8673 Personal history of transient ischemic attack (TIA), and cerebral infarction without residual deficits: Secondary | ICD-10-CM | POA: Diagnosis not present

## 2017-03-10 DIAGNOSIS — Z85528 Personal history of other malignant neoplasm of kidney: Secondary | ICD-10-CM | POA: Diagnosis not present

## 2017-03-10 DIAGNOSIS — E119 Type 2 diabetes mellitus without complications: Secondary | ICD-10-CM | POA: Diagnosis not present

## 2017-03-10 DIAGNOSIS — Z7902 Long term (current) use of antithrombotics/antiplatelets: Secondary | ICD-10-CM | POA: Insufficient documentation

## 2017-03-10 DIAGNOSIS — Z7982 Long term (current) use of aspirin: Secondary | ICD-10-CM | POA: Insufficient documentation

## 2017-03-10 DIAGNOSIS — R404 Transient alteration of awareness: Secondary | ICD-10-CM | POA: Diagnosis not present

## 2017-03-10 DIAGNOSIS — Z79899 Other long term (current) drug therapy: Secondary | ICD-10-CM | POA: Diagnosis not present

## 2017-03-10 DIAGNOSIS — R112 Nausea with vomiting, unspecified: Secondary | ICD-10-CM | POA: Diagnosis not present

## 2017-03-10 DIAGNOSIS — R1110 Vomiting, unspecified: Secondary | ICD-10-CM | POA: Diagnosis present

## 2017-03-10 DIAGNOSIS — R220 Localized swelling, mass and lump, head: Secondary | ICD-10-CM | POA: Diagnosis not present

## 2017-03-10 DIAGNOSIS — N200 Calculus of kidney: Secondary | ICD-10-CM | POA: Diagnosis not present

## 2017-03-10 LAB — URINALYSIS, ROUTINE W REFLEX MICROSCOPIC
Bacteria, UA: NONE SEEN
Bilirubin Urine: NEGATIVE
Glucose, UA: 50 mg/dL — AB
Hgb urine dipstick: NEGATIVE
Ketones, ur: 5 mg/dL — AB
Nitrite: NEGATIVE
Protein, ur: 100 mg/dL — AB
Specific Gravity, Urine: 1.023 (ref 1.005–1.030)
pH: 5 (ref 5.0–8.0)

## 2017-03-10 LAB — CBC WITH DIFFERENTIAL/PLATELET
Basophils Absolute: 0.1 10*3/uL (ref 0.0–0.1)
Basophils Relative: 0 %
Eosinophils Absolute: 0 10*3/uL (ref 0.0–0.7)
Eosinophils Relative: 0 %
HCT: 30.9 % — ABNORMAL LOW (ref 36.0–46.0)
Hemoglobin: 10.2 g/dL — ABNORMAL LOW (ref 12.0–15.0)
Lymphocytes Relative: 5 %
Lymphs Abs: 0.6 10*3/uL — ABNORMAL LOW (ref 0.7–4.0)
MCH: 26.8 pg (ref 26.0–34.0)
MCHC: 33 g/dL (ref 30.0–36.0)
MCV: 81.3 fL (ref 78.0–100.0)
Monocytes Absolute: 0.5 10*3/uL (ref 0.1–1.0)
Monocytes Relative: 4 %
Neutro Abs: 10.7 10*3/uL — ABNORMAL HIGH (ref 1.7–7.7)
Neutrophils Relative %: 91 %
Platelets: 329 10*3/uL (ref 150–400)
RBC: 3.8 MIL/uL — ABNORMAL LOW (ref 3.87–5.11)
RDW: 15 % (ref 11.5–15.5)
WBC: 11.8 10*3/uL — ABNORMAL HIGH (ref 4.0–10.5)

## 2017-03-10 LAB — COMPREHENSIVE METABOLIC PANEL
ALT: 11 U/L — ABNORMAL LOW (ref 14–54)
AST: 16 U/L (ref 15–41)
Albumin: 4 g/dL (ref 3.5–5.0)
Alkaline Phosphatase: 89 U/L (ref 38–126)
Anion gap: 7 (ref 5–15)
BUN: 23 mg/dL — ABNORMAL HIGH (ref 6–20)
CO2: 26 mmol/L (ref 22–32)
Calcium: 8.8 mg/dL — ABNORMAL LOW (ref 8.9–10.3)
Chloride: 105 mmol/L (ref 101–111)
Creatinine, Ser: 1.67 mg/dL — ABNORMAL HIGH (ref 0.44–1.00)
GFR calc Af Amer: 33 mL/min — ABNORMAL LOW (ref >60)
GFR calc non Af Amer: 28 mL/min — ABNORMAL LOW (ref >60)
Glucose, Bld: 202 mg/dL — ABNORMAL HIGH (ref 65–99)
Potassium: 4.3 mmol/L (ref 3.5–5.1)
Sodium: 138 mmol/L (ref 135–145)
Total Bilirubin: 0.7 mg/dL (ref 0.3–1.2)
Total Protein: 7.1 g/dL (ref 6.5–8.1)

## 2017-03-10 LAB — LIPASE, BLOOD: Lipase: 38 U/L (ref 11–51)

## 2017-03-10 MED ORDER — ONDANSETRON HCL 4 MG/2ML IJ SOLN
4.0000 mg | Freq: Once | INTRAMUSCULAR | Status: AC
  Administered 2017-03-10: 4 mg via INTRAVENOUS
  Filled 2017-03-10: qty 2

## 2017-03-10 MED ORDER — ONDANSETRON HCL 4 MG PO TABS: 4.0000 mg | ORAL_TABLET | Freq: Four times a day (QID) | ORAL | 0 refills | Status: DC

## 2017-03-10 MED ORDER — SODIUM CHLORIDE 0.9 % IV BOLUS (SEPSIS)
500.0000 mL | Freq: Once | INTRAVENOUS | Status: AC
  Administered 2017-03-10: 500 mL via INTRAVENOUS

## 2017-03-10 NOTE — ED Triage Notes (Signed)
Per EMS, pt from home.  (Picked up at [employer] visiting husband).  Pt c/o n/v sudden onset.  Vitals:  138 palp, 70 hr, resp 16, 95% ra cbg 243

## 2017-03-10 NOTE — ED Notes (Signed)
Pt has removed clothes & jewelry and gown put on for test. Jewelery was placed in 2 urine specimen cups with pt labels placed on both cups. One cup has a silver watch & a silver bracelet with white balls & clear stones. Second cup has 1 silver necklace with silver squirrel charm, and 3 silver rings with clear stones.

## 2017-03-10 NOTE — ED Notes (Signed)
Pt also c/o left arm numbness and feeling of bilateral facial swelling.  No swelling noted.  No neuro deficit seen.

## 2017-03-10 NOTE — Discharge Instructions (Signed)
Follow up next week if not improving.

## 2017-03-10 NOTE — ED Notes (Signed)
Patient returned from MRI.

## 2017-03-10 NOTE — ED Provider Notes (Signed)
[facility] DEPT Provider Note   CSN: [id number] Arrival date & time: 03/10/17  0940     History   Chief Complaint Chief Complaint  Patient presents with  . Emesis    HPI Gabrielle [name] is a 82 y.o. female.  Pt states she vomited once and has numbness to left hand and lips   The history is provided by the patient. No language interpreter was used.  Emesis   This is a new problem. The problem occurs 2 to 4 times per day. The problem has been resolved. The emesis has an appearance of stomach contents. There has been no fever. Pertinent negatives include no abdominal pain, no cough, no diarrhea and no headaches.    Past Medical History:  Diagnosis Date  . Arthritis   . Azotemia   . Carotid artery disease ([place])   . Claudication ([place])   . CVA (cerebral vascular accident) ([place])    [place] DIZZINESS 2008  . Diabetes mellitus    1990  . Essential hypertension, benign   . History of anemia   . History of gout   . Hyperlipidemia   . Neck pain   . PAD (peripheral artery disease) (HCC)    hx. LCEA, hx Lt renal art. stenting, occl rt renal artery, occluded bil SFAs, moderatie iliac disease,    . Renal cell cancer ([place]) 2001  . Seizure [facility])     Patient Active Problem List   Diagnosis Date Noted  . Iron deficiency anemia 07/08/2016  . Chronic insomnia 12/10/2015  . Spinal stenosis of lumbar region 05/21/2014  . Carotid artery disease ([place]) 02/26/2014  . PAD (peripheral artery disease) ([place])   . Renal cell cancer ([place])   . Diabetes mellitus   . Essential hypertension, benign   . Hyperlipidemia   . History of anemia   . CVA (cerebral vascular accident) [facility])     Past Surgical History:  Procedure Laterality Date  . CAROTID ENDARTERECTOMY     LEFT  . NEPHRECTOMY     RIGHT  . NM MYOCAR PERF WALL MOTION  07/12/2011   normal  . PV angiogram  2004   Lt renal artery stent  . URETERAL STENT PLACEMENT     LEFT    OB History    No data available       Home  Medications    Prior to Admission medications   Medication Sig Start Date End Date Taking? Authorizing Provider  amLODipine (NORVASC) 5 MG tablet Take 1 tablet (5 mg total) by mouth daily. 09/12/16   Darlyne Russian, MD  aspirin 81 MG tablet Take 81 mg by mouth daily.    Historical Provider, MD  atorvastatin (LIPITOR) 20 MG tablet Take 1 tablet (20 mg total) by mouth daily. 06/20/16   Darlyne Russian, MD  citalopram (CELEXA) 10 MG tablet Take 1 tablet (10 mg total) by mouth daily. 09/29/16   Elizabeth Whitney McVey, PA-C  clopidogrel (PLAVIX) 75 MG tablet Take 1 tablet (75 mg total) by mouth daily. 09/12/16   Darlyne Russian, MD  ferrous sulfate 325 (65 FE) MG tablet Take 1 tablet (325 mg total) by mouth daily with breakfast. 05/03/16   Darlyne Russian, MD  HYDROcodone-acetaminophen (NORCO) 10-325 MG per tablet Take 1 tablet by mouth every 6 (six) hours as needed for severe pain.    Historical Provider, MD  lisinopril (PRINIVIL,ZESTRIL) 40 MG tablet Take 1 tablet (40 mg total) by mouth daily. 09/12/16   Darlyne Russian,  MD  ondansetron (ZOFRAN) 4 MG tablet Take 1 tablet (4 mg total) by mouth every 6 (six) hours. 03/10/17   Milton Ferguson, MD  Pumpkin Seed-Soy Germ (AZO BLADDER CONTROL/GO-LESS PO) Take by mouth.    Historical Provider, MD  Suvorexant (BELSOMRA) 10 MG TABS Take 10 mg by mouth at bedtime. 09/29/16   Elizabeth Whitney McVey, PA-C  temazepam (RESTORIL) 15 MG capsule 1 qhs may repeat 12/14/16   Norma Fredrickson, MD  zolpidem (AMBIEN CR) 6.25 MG CR tablet Take 1 tablet (6.25 mg total) by mouth at bedtime as needed for sleep. 02/15/17   Norma Fredrickson, MD    Family History Family History  Problem Relation Age of Onset  . Cancer Mother   . Heart disease Father     Social History Social History  Substance Use Topics  . Smoking status: Former Smoker    Quit date: 12/26/2005  . Smokeless tobacco: Never Used  . Alcohol use No     Allergies   Codeine; Coreg [carvedilol]; Fentanyl; Lacosamide;  Levetiracetam; Metformin and related; Metoprolol; Morphine and related; Oxybutynin; Sertraline; Tessalon [benzonatate]; and Other   Review of Systems Review of Systems  Constitutional: Negative for appetite change and fatigue.  HENT: Negative for congestion, ear discharge and sinus pressure.   Eyes: Negative for discharge.  Respiratory: Negative for cough.   Cardiovascular: Negative for chest pain.  Gastrointestinal: Positive for vomiting. Negative for abdominal pain and diarrhea.  Genitourinary: Negative for frequency and hematuria.  Musculoskeletal: Negative for back pain.  Skin: Negative for rash.  Neurological: Negative for seizures and headaches.  Psychiatric/Behavioral: Negative for hallucinations.     Physical Exam Updated Vital Signs BP (!) 118/54 (BP Location: Right Arm)   Pulse 68   Temp 97.5 F (36.4 C)   Resp 16   SpO2 100%   Physical Exam  Constitutional: She is oriented to person, place, and time. She appears well-developed.  HENT:  Head: Normocephalic.  Eyes: Conjunctivae and EOM are normal. No scleral icterus.  Neck: Neck supple. No thyromegaly present.  Cardiovascular: Normal rate and regular rhythm.  Exam reveals no gallop and no friction rub.   No murmur heard. Pulmonary/Chest: No stridor. She has no wheezes. She has no rales. She exhibits no tenderness.  Abdominal: She exhibits no distension. There is no tenderness. There is no rebound.  Musculoskeletal: Normal range of motion. She exhibits no edema.  Lymphadenopathy:    She has no cervical adenopathy.  Neurological: She is oriented to person, place, and time. She exhibits normal muscle tone. Coordination normal.  Skin: No rash noted. No erythema.  Psychiatric: She has a normal mood and affect. Her behavior is normal.     ED Treatments / Results  Labs (all labs ordered are listed, but only abnormal results are displayed) Labs Reviewed  CBC WITH DIFFERENTIAL/PLATELET - Abnormal; Notable for the  following:       Result Value   WBC 11.8 (*)    RBC 3.80 (*)    Hemoglobin 10.2 (*)    HCT 30.9 (*)    Neutro Abs 10.7 (*)    Lymphs Abs 0.6 (*)    All other components within normal limits  COMPREHENSIVE METABOLIC PANEL - Abnormal; Notable for the following:    Glucose, Bld 202 (*)    BUN 23 (*)    Creatinine, Ser 1.67 (*)    Calcium 8.8 (*)    ALT 11 (*)    GFR calc non Af Amer 28 (*)  GFR calc Af Amer 33 (*)    All other components within normal limits  URINALYSIS, ROUTINE W REFLEX MICROSCOPIC - Abnormal; Notable for the following:    Glucose, UA 50 (*)    Ketones, ur 5 (*)    Protein, ur 100 (*)    Leukocytes, UA SMALL (*)    Squamous Epithelial / LPF 0-5 (*)    All other components within normal limits  LIPASE, BLOOD    EKG  EKG Interpretation None       Radiology Ct Head Wo Contrast  Result Date: 03/10/2017 CLINICAL DATA:  Left arm numbness and facial swelling, initial encounter EXAM: CT HEAD WITHOUT CONTRAST TECHNIQUE: Contiguous axial images were obtained from the base of the skull through the vertex without intravenous contrast. COMPARISON:  09/15/2012 FINDINGS: Brain: Chronic white matter ischemic changes noted. No findings to suggest acute hemorrhage, acute infarction or space-occupying mass lesion are noted. Vascular: No hyperdense vessel or unexpected calcification. Skull: Normal. Negative for fracture or focal lesion. Sinuses/Orbits: No acute finding. Other: None. IMPRESSION: Chronic white matter ischemic change without acute abnormality. Electronically Signed   By: Inez Catalina M.D.   On: 03/10/2017 10:57   Mr Brain Wo Contrast  Result Date: 03/10/2017 CLINICAL DATA:  82 year old female with bilateral facial swelling and left arm numbness. Initial encounter. EXAM: MRI HEAD WITHOUT CONTRAST TECHNIQUE: Multiplanar, multiecho pulse sequences of the brain and surrounding structures were obtained without intravenous contrast. COMPARISON:  Head CT without contrast  1040 hours today. Brain MRI 09/15/2012 and earlier. FINDINGS: Brain: No restricted diffusion to suggest acute infarction. No midline shift, mass effect, evidence of mass lesion, ventriculomegaly, extra-axial collection or acute intracranial hemorrhage. Cervicomedullary junction and pituitary are within normal limits. Chronic Patchy and confluent bilateral cerebral white matter T2 and FLAIR hyperintensity, in including bilateral deep white matter capsule involvement. Interval increased superimposed T2 heterogeneity in the bilateral deep gray matter nuclei, at least in part related to perivascular spaces. No cortical encephalomalacia. No chronic cerebral blood products. Patchy T2 hyperintensity in the central pons is not significantly changed. Cerebellum remains normal. Hippocampal formations appear symmetric and within normal limits for age. Vascular: Major intracranial vascular flow voids are stable since 2013, distal left vertebral artery is dominant. Skull and upper cervical spine: Mild multilevel cervical spondylolisthesis. Normal bone marrow signal. Sinuses/Orbits: Interval postoperative changes to both globes. Otherwise normal orbits soft tissues. Visualized paranasal sinuses and mastoids are stable and well pneumatized. Other: Visible internal auditory structures appear normal. Negative scalp soft tissues. IMPRESSION: 1.  No acute intracranial abnormality. 2. Chronically advanced signal changes in the cerebral white matter and central pons with mildly progressed deep gray matter signal heterogeneity since 2013. Favor chronic small vessel disease. Electronically Signed   By: Genevie Ann M.D.   On: 03/10/2017 15:24    Procedures Procedures (including critical care time)  Medications Ordered in ED Medications  sodium chloride 0.9 % bolus 500 mL (0 mLs Intravenous Stopped 03/10/17 1215)  ondansetron (ZOFRAN) injection 4 mg (4 mg Intravenous Given 03/10/17 1057)     Initial Impression / Assessment and Plan /  ED Course  I have reviewed the triage vital signs and the nursing notes.  Pertinent labs & imaging results that were available during my care of the patient were reviewed by me and considered in my medical decision making (see chart for details).     pt with vomiting resolved.  Labs and ct neg.  tx with zofran and follow up   Final Clinical  Impressions(s) / ED Diagnoses   Final diagnoses:  Gastroenteritis    New Prescriptions New Prescriptions   ONDANSETRON (ZOFRAN) 4 MG TABLET    Take 1 tablet (4 mg total) by mouth every 6 (six) hours.     Milton Ferguson, MD 03/10/17 1537

## 2017-04-14 ENCOUNTER — Ambulatory Visit (INDEPENDENT_AMBULATORY_CARE_PROVIDER_SITE_OTHER): Payer: Medicare Other | Admitting: Psychiatry

## 2017-04-14 ENCOUNTER — Encounter (HOSPITAL_COMMUNITY): Payer: Self-pay | Admitting: Psychiatry

## 2017-04-14 VITALS — BP 160/70 | HR 101 | Ht 60.0 in | Wt 136.8 lb

## 2017-04-14 DIAGNOSIS — F5101 Primary insomnia: Secondary | ICD-10-CM

## 2017-04-14 DIAGNOSIS — Z79899 Other long term (current) drug therapy: Secondary | ICD-10-CM | POA: Diagnosis not present

## 2017-04-14 DIAGNOSIS — Z87891 Personal history of nicotine dependence: Secondary | ICD-10-CM

## 2017-04-14 DIAGNOSIS — Z7982 Long term (current) use of aspirin: Secondary | ICD-10-CM | POA: Diagnosis not present

## 2017-04-14 MED ORDER — ZOLPIDEM TARTRATE ER 12.5 MG PO TBCR: 12.5000 mg | EXTENDED_RELEASE_TABLET | Freq: Every evening | ORAL | 4 refills | Status: DC | PRN

## 2017-04-14 MED ORDER — TEMAZEPAM 15 MG PO CAPS: ORAL_CAPSULE | ORAL | 4 refills | Status: DC

## 2017-04-14 NOTE — Progress Notes (Signed)
Psychiatric Initial Adult Assessment   Patient Identification: Gabrielle [name] MRN:  [id number] Date of Evaluation:  04/14/2017 Referral Source: Dr. [name] Chief Complaint:   Visit Diagnosis: No diagnosis found.  Today the patient is doing fair. Unfortunately she still being a great deal of problems sleeping. Her history is that up until about 2 years ago she was sleeping fine taking 10 mg of Ambien. She's been on it for years without a problem. She does not snore she has no other sleep disorders she does not have restless leg syndrome. I believe she has insomnia disorder. Nonetheless about a year or 2 ago her primary care doctor began becoming concerned thinking that she might sleep walk The reality is that she is never sleepwalk. This is just a nonissue. The patient is not oversedated by her sleeping aids. She's never fallen. At this time her husband Gabrielle [name] is in a nursing home and she visits him every day. When she is there she so sedated and sleepy that she falls asleep at times. She says she is so sleepy she couldn't do very much other activity anyway. She says at times when she stimulated she won't but for the most part her sleep is distinctly worse since being off the Ambien. Since I've known her we've tried Restoril which has been only mildly helpful. Her last visit we added to it a low dose of Ambien CR 6.25 mg. This is had a marginal affect. She still is very symptomatic. It should be noted the patient has no history of clinical depression. She is no specific anxiety disorder. Once again we reviewed with her the importance of good sleep hygiene. She's not 100% compliant and that she 5 result watching TV in bed. This patient uses no drugs or alcohol. She medically is actually very healthy. (Hypo) Manic Symptoms:   Anxiety Symptoms:   Psychotic Symptoms:   PTSD Symptoms:   Past Psychiatric History: Negative  Previous Psychotropic Medications: Yes   Substance Abuse History in the last 12  months:  No.  Consequences of Substance Abuse: NA  Past Medical History:  Past Medical History:  Diagnosis Date  . Arthritis   . Azotemia   . Carotid artery disease ([place])   . Claudication ([place])   . CVA (cerebral vascular accident) ([place])    [place] DIZZINESS 2008  . Diabetes mellitus    1990  . Essential hypertension, benign   . History of anemia   . History of gout   . Hyperlipidemia   . Neck pain   . PAD (peripheral artery disease) (HCC)    hx. LCEA, hx Lt renal art. stenting, occl rt renal artery, occluded bil SFAs, moderatie iliac disease,    . Renal cell cancer ([place]) 2001  . Seizure [facility])     Past Surgical History:  Procedure Laterality Date  . CAROTID ENDARTERECTOMY     LEFT  . NEPHRECTOMY     RIGHT  . NM MYOCAR PERF WALL MOTION  07/12/2011   normal  . PV angiogram  2004   Lt renal artery stent  . URETERAL STENT PLACEMENT     LEFT    Family Psychiatric History:   Family History:  Family History  Problem Relation Age of Onset  . Cancer Mother   . Heart disease Father     Social History:   Social History   Social History  . Marital status: Married    Spouse name: N/A  . Number of children: 2  . Years of  education: N/A   Occupational History  . Retired Retired   Social History Main Topics  . Smoking status: Former Smoker    Quit date: 12/26/2005  . Smokeless tobacco: Never Used  . Alcohol use No  . Drug use: No  . Sexual activity: No   Other Topics Concern  . None   Social History Narrative  . None    Additional Social History:   Allergies:   Allergies  Allergen Reactions  . Codeine Nausea And Vomiting  . Coreg [Carvedilol] Nausea And Vomiting  . Fentanyl   . Lacosamide      Other name is, VIMPAT  . Levetiracetam Other (See Comments)    Strange feelings in head  . Metformin And Related   . Metoprolol   . Morphine And Related Itching  . Oxybutynin Itching  . Sertraline Nausea Only and Other (See Comments)    Swelling in mouth  .  Tessalon [Benzonatate]   . Other Rash    BETA BLOCKER    Metabolic Disorder Labs: Lab Results  Component Value Date   HGBA1C 6.3 09/12/2016   MPG 128 (H) 07/30/2010   No results found for: PROLACTIN Lab Results  Component Value Date   CHOL 145 05/03/2016   TRIG 124 05/03/2016   HDL 61 05/03/2016   CHOLHDL 2.4 05/03/2016   VLDL 25 05/03/2016   LDLCALC 59 05/03/2016   LDLCALC 66 08/14/2013     Current Medications: Current Outpatient Prescriptions  Medication Sig Dispense Refill  . amLODipine (NORVASC) 5 MG tablet Take 1 tablet (5 mg total) by mouth daily. 90 tablet 3  . aspirin 81 MG tablet Take 81 mg by mouth daily.    Marland Kitchen atorvastatin (LIPITOR) 20 MG tablet Take 1 tablet (20 mg total) by mouth daily. 90 tablet 3  . clopidogrel (PLAVIX) 75 MG tablet Take 1 tablet (75 mg total) by mouth daily. 90 tablet 2  . ferrous sulfate 325 (65 FE) MG tablet Take 1 tablet (325 mg total) by mouth daily with breakfast. 30 tablet 3  . HYDROcodone-acetaminophen (NORCO) 10-325 MG per tablet Take 1 tablet by mouth every 6 (six) hours as needed for severe pain.    Marland Kitchen lisinopril (PRINIVIL,ZESTRIL) 40 MG tablet Take 1 tablet (40 mg total) by mouth daily. 90 tablet 3  . ondansetron (ZOFRAN) 4 MG tablet Take 1 tablet (4 mg total) by mouth every 6 (six) hours. 12 tablet 0  . Pumpkin Seed-Soy Germ (AZO BLADDER CONTROL/GO-LESS PO) Take 1 tablet by mouth daily.     . Suvorexant (BELSOMRA) 10 MG TABS Take 10 mg by mouth at bedtime. 30 tablet 0  . temazepam (RESTORIL) 15 MG capsule 1 qhs may repeat 30 capsule 4  . zolpidem (AMBIEN CR) 6.25 MG CR tablet Take 1 tablet (6.25 mg total) by mouth at bedtime as needed for sleep. 30 tablet 5   No current facility-administered medications for this visit.     Neurologic: Headache: No Seizure: No Paresthesias:NA  Musculoskeletal: Strength & Muscle Tone: Normal Gait & Station: Normal Patient leans: N/A  Psychiatric Specialty Exam: ROS  Blood pressure (!)  160/70, pulse (!) 101, height 5' (1.524 m), weight 136 lb 12.8 oz (62.1 kg).Body mass index is 26.72 kg/m.  General Appearance: Well Groomed  Eye Contact:  Good  Speech:  Clear and Coherent  Volume:  Normal  Mood:  Euthymic  Affect:  Appropriate  Thought Process:  Goal Directed  Orientation:  NA  Thought Content:  Logical  Suicidal Thoughts:  No  Homicidal Thoughts:  No  Memory:  NA  Judgement:  Good  Insight:  Good  Psychomotor Activity:  Normal  Concentration:  Concentration: Good  Recall:  Good  Fund of Knowledge:Good  Language: Negative  Akathisia:  No  Handed:  Right  AIMS (if indicated):    Assets:  Desire for Improvement  ADL's:  Intact  Cognition: WNL  Sleep:  Abnormal , problems maintaining     Treatment Plan Summary: Medication management 4/20/20189:58 AM  At this time the patient will continue taking Restoril 15 mg. We'll clarify that should this be one at night. We called into the pharmacy. Also spoke pharmacy about discontinuing any refills for her Ambien 6.25 and to start her on 12.5 mg CR every night. The patient received for refills for both of these agents. She was given warnings about sitting on the end of the bed before standing right up in the middle the night. Patient was told she felt oversedated to discontinue the Ambien. I do not think this is currently a problem at all. My hope is that she'll get a good night sleep. She meets the criteria of Insomnia disorder. She'll return to see me in 3 months for a 15 minute visit.

## 2017-06-10 ENCOUNTER — Ambulatory Visit (INDEPENDENT_AMBULATORY_CARE_PROVIDER_SITE_OTHER): Payer: Medicare Other | Admitting: Physician Assistant

## 2017-06-10 ENCOUNTER — Encounter: Payer: Self-pay | Admitting: Physician Assistant

## 2017-06-10 VITALS — BP 181/82 | HR 105 | Temp 99.5°F | Resp 16 | Ht 60.0 in | Wt 134.6 lb

## 2017-06-10 DIAGNOSIS — R030 Elevated blood-pressure reading, without diagnosis of hypertension: Secondary | ICD-10-CM | POA: Diagnosis not present

## 2017-06-10 DIAGNOSIS — H1033 Unspecified acute conjunctivitis, bilateral: Secondary | ICD-10-CM

## 2017-06-10 DIAGNOSIS — R21 Rash and other nonspecific skin eruption: Secondary | ICD-10-CM

## 2017-06-10 MED ORDER — HYDROXYZINE HCL 25 MG PO TABS: 25.0000 mg | ORAL_TABLET | Freq: Every day | ORAL | 0 refills | Status: DC | PRN

## 2017-06-10 MED ORDER — TRIAMCINOLONE ACETONIDE 0.1 % EX CREA: 1.0000 "application " | TOPICAL_CREAM | Freq: Two times a day (BID) | CUTANEOUS | 0 refills | Status: DC

## 2017-06-10 MED ORDER — NAPHAZOLINE-PHENIRAMINE 0.025-0.3 % OP SOLN: 1.0000 [drp] | Freq: Four times a day (QID) | OPHTHALMIC | 0 refills | Status: DC | PRN

## 2017-06-10 MED ORDER — CETIRIZINE HCL 5 MG PO TABS: 5.0000 mg | ORAL_TABLET | Freq: Every day | ORAL | 1 refills | Status: DC

## 2017-06-10 NOTE — Progress Notes (Signed)
Gabrielle [name]  MRN: [id number] DOB: 08/02/1935  Subjective:  Gabrielle [name] is a 82 y.o. female seen in office today for a chief complaint of bilateral eye itching x 2 days. Denies acute injury, pain, discharge, photophobia, visual disturbance, and foreign body sensation. Denies recent chemical exposure. Denies contact lens use. Has tried OTC eye drops but cannot recall what kind. Has hx of cataracts. Pt also having a rash on her back and right arm x 1 week. States it is pruritic. Denies recent exposure to new plants, laundry detergent, lotions, and soaps. Has tried benedryl with no relief. Has hx of seasonal allergies. Received shots years ago for 5 years then their allergies resolved. Has not had to take allergy meds in years.   Review of Systems  Constitutional: Negative for chills, fatigue and fever.  HENT: Negative for congestion, ear pain, rhinorrhea, sinus pain, sinus pressure and sneezing.   Respiratory: Negative for cough and shortness of breath.   Cardiovascular: Negative for chest pain, palpitations and leg swelling.  Gastrointestinal: Negative for abdominal pain, nausea and vomiting.  Genitourinary: Negative for hematuria.    Patient Active Problem List   Diagnosis Date Noted  . Iron deficiency anemia 07/08/2016  . Chronic insomnia 12/10/2015  . Spinal stenosis of lumbar region 05/21/2014  . Carotid artery disease ([place]) 02/26/2014  . PAD (peripheral artery disease) ([place])   . Renal cell cancer ([place])   . Diabetes mellitus   . Essential hypertension, benign   . Hyperlipidemia   . History of anemia   . CVA (cerebral vascular accident) [facility])     Current Outpatient Prescriptions on File Prior to Visit  Medication Sig Dispense Refill  . amLODipine (NORVASC) 5 MG tablet Take 1 tablet (5 mg total) by mouth daily. 90 tablet 3  . aspirin 81 MG tablet Take 81 mg by mouth daily.    Marland Kitchen atorvastatin (LIPITOR) 20 MG tablet Take 1 tablet (20 mg total) by mouth daily. 90 tablet 3    . clopidogrel (PLAVIX) 75 MG tablet Take 1 tablet (75 mg total) by mouth daily. 90 tablet 2  . ferrous sulfate 325 (65 FE) MG tablet Take 1 tablet (325 mg total) by mouth daily with breakfast. 30 tablet 3  . HYDROcodone-acetaminophen (NORCO) 10-325 MG per tablet Take 1 tablet by mouth every 6 (six) hours as needed for severe pain.    Marland Kitchen lisinopril (PRINIVIL,ZESTRIL) 40 MG tablet Take 1 tablet (40 mg total) by mouth daily. 90 tablet 3  . ondansetron (ZOFRAN) 4 MG tablet Take 1 tablet (4 mg total) by mouth every 6 (six) hours. 12 tablet 0  . Pumpkin Seed-Soy Germ (AZO BLADDER CONTROL/GO-LESS PO) Take 1 tablet by mouth daily.     . Suvorexant (BELSOMRA) 10 MG TABS Take 10 mg by mouth at bedtime. 30 tablet 0  . temazepam (RESTORIL) 15 MG capsule 1 qhs may repeat 30 capsule 4  . zolpidem (AMBIEN CR) 12.5 MG CR tablet Take 1 tablet (12.5 mg total) by mouth at bedtime as needed for sleep. 30 tablet 4   No current facility-administered medications on file prior to visit.     Allergies  Allergen Reactions  . Codeine Nausea And Vomiting  . Coreg [Carvedilol] Nausea And Vomiting  . Fentanyl   . Lacosamide      Other name is, VIMPAT  . Levetiracetam Other (See Comments)    Strange feelings in head  . Metformin And Related   . Metoprolol   . Morphine And  Related Itching  . Oxybutynin Itching  . Sertraline Nausea Only and Other (See Comments)    Swelling in mouth  . Tessalon [Benzonatate]   . Other Rash    BETA BLOCKER      Social History   Social History  . Marital status: Married    Spouse name: N/A  . Number of children: 2  . Years of education: N/A   Occupational History  . Retired Retired   Social History Main Topics  . Smoking status: Former Smoker    Quit date: 12/26/2005  . Smokeless tobacco: Never Used  . Alcohol use No  . Drug use: No  . Sexual activity: No   Other Topics Concern  . Not on file   Social History Narrative  . No narrative on file    Objective:  BP  (!) 182/65   Pulse (!) 105   Temp 99.5 F (37.5 C) (Oral)   Resp 16   Ht 5' (1.524 m)   Wt 134 lb 9.6 oz (61.1 kg)   SpO2 99%   BMI 26.29 kg/m   No exam data present  Physical Exam  Constitutional: She is oriented to person, place, and time and well-developed, well-nourished, and in no distress.  HENT:  Head: Normocephalic and atraumatic.  Right Ear: Tympanic membrane, external ear and ear canal normal.  Left Ear: Tympanic membrane, external ear and ear canal normal.  Nose: Rhinorrhea present.  Mouth/Throat: Uvula is midline. Mucous membranes are dry. Posterior oropharyngeal erythema present.  Eyes: Right eye visual fields normal and left eye visual fields normal. EOM are normal. Pupils are equal, round, and reactive to light. Right eye exhibits discharge ( watery). Left eye exhibits discharge ( watery). Right conjunctiva is injected. Left conjunctiva is injected.  Fundoscopic exam:      The right eye shows no AV nicking and no hemorrhage. The right eye shows red reflex.       The left eye shows no AV nicking and no hemorrhage. The left eye shows red reflex.  Neck: Normal range of motion.  Cardiovascular: Normal rate, regular rhythm and normal heart sounds.   Pulmonary/Chest: Effort normal and breath sounds normal.  Lymphadenopathy:       Head (right side): No submental, no submandibular, no tonsillar, no preauricular, no posterior auricular and no occipital adenopathy present.       Head (left side): No submental, no submandibular, no tonsillar, no preauricular, no posterior auricular and no occipital adenopathy present.    She has no cervical adenopathy.       Right: No supraclavicular adenopathy present.       Left: No supraclavicular adenopathy present.  Neurological: She is alert and oriented to person, place, and time. Gait normal.  Skin: Skin is warm and dry. Rash noted. Rash is papular ( multple erythematous scaly papular lesions noted left upper posterior trunk and right  upper extremity).  Psychiatric: Affect normal.  Vitals reviewed.   Assessment and Plan :   1. Acute conjunctivitis of both eyes, unspecified acute conjunctivitis type Consistent with allergic conjunctivitis, will treat accordingly.  - naphazoline-pheniramine (NAPHCON-A) 0.025-0.3 % ophthalmic solution; Place 1 drop into both eyes 4 (four) times daily as needed for irritation.  Dispense: 15 mL; Refill: 0 - cetirizine (ZYRTEC) 5 MG tablet; Take 1 tablet (5 mg total) by mouth daily.  Dispense: 30 tablet; Refill: 1  2. Rash and nonspecific skin eruption Consistent with contact dermatitis. Pt instructed to return to clinic if symptoms worsen, do  not improve, or as needed. Educated on side effects of hydroxyzine, recommended to only take 0.5 tablet at night for itching.  - triamcinolone cream (KENALOG) 0.1 %; Apply 1 application topically 2 (two) times daily.  Dispense: 30 g; Refill: 0 - hydrOXYzine (ATARAX/VISTARIL) 25 MG tablet; Take 1 tablet (25 mg total) by mouth daily as needed for itching (Take 0.5-1 pill at bedtime prn for itching). Take 0.5-1 pill at bedtime prn for itching  Dispense: 30 tablet; Refill: 0  3. Elevated blood pressure reading She has taken her bp medication today. Pt is asymptomatic at this time. Likely elevated due to discomfort of itching. Encouraged to check bp outside office after she starts tx for eyes and rash. Goal is <140/90. Return if values consistently elevated beyond this value. Given strict ED precautions.   Tenna Delaine, PA-C  [facility] [place] 06/10/2017 4:42 PM

## 2017-06-10 NOTE — Patient Instructions (Addendum)
For your eyes, please use eye drops to both eyes as prescribed. Also, take zyrtec 5mg  daily.  For your rash, apply steroid cream to affected area three times a day for the next week. Use over the counter eucerin cream to skin at night. You may also use half a tablet of hydroxyzine at night for itching. However, this is can cause you to be drowsy so use it with caution. Do not take benedryl when you are taking this medication. Please return to clinic if symptoms worsen, do not improve, or as needed  In terms of elevated blood pressure, I would like you to check your blood pressure at least a couple times over the next week outside of the office and document these values. It is best if you check the blood pressure at different times in the day. Your goal is <140/90. If your values are consistently above this goal, please return to office for further evaluation. If you start to have chest pain, blurred vision, shortness of breath, severe headache, lower leg swelling, or nausea/vomiting please seek care immediately here or at the ED.   Allergic Conjunctivitis A clear membrane (conjunctiva) covers the white part of your eye and the inner surface of your eyelid. Allergic conjunctivitis happens when this membrane has inflammation. This is caused by allergies. Common causes of allergic reactions (allergens)include:  Outdoor allergens, such as: ? Pollen. ? Grass and weeds. ? Mold spores.  Indoor allergens, such as: ? Dust. ? Smoke. ? Mold. ? Pet dander. ? Animal hair.  This condition can make your eye red or pink. It can also make your eye feel itchy. This condition cannot be spread from one person to another person (is not contagious). Follow these instructions at home:  Try not to be around things that you are allergic to.  Take or apply over-the-counter and prescription medicines only as told by your doctor. These include any eye drops.  Place a cool, clean washcloth on your eye for 10-20  minutes. Do this 3-4 times a day.  Do not touch or rub your eyes.  Do not wear contact lenses until the inflammation is gone. Wear glasses instead.  Do not wear eye makeup until the inflammation is gone.  Keep all follow-up visits as told by your doctor. This is important. Contact a doctor if:  Your symptoms get worse.  Your symptoms do not get better with treatment.  You have mild eye pain.  You are sensitive to light,  You have spots or blisters on your eyes.  You have pus coming from your eye.  You have a fever. Get help right away if:  You have redness, swelling, or other symptoms in only one eye.  Your vision is blurry.  You have vision changes.  You have very bad eye pain. Summary  Allergic conjunctivitis is caused by allergies. It can make your eye red or pink, and it can make your eye feel itchy.  This condition cannot be spread from one person to another person (is not contagious).  Try not to be around things that you are allergic to.  Take or apply over-the-counter and prescription medicines only as told by your doctor. These include any eye drops.  Contact your doctor if your symptoms get worse or they do not get better with treatment. This information is not intended to replace advice given to you by your health care provider. Make sure you discuss any questions you have with your health care provider. Document Released: 06/01/2010  Document Revised: 08/05/2016 Document Reviewed: 08/05/2016 Elsevier Interactive Patient Education  2017 [place] Dermatitis Dermatitis is redness, soreness, and swelling (inflammation) of the skin. Contact dermatitis is a reaction to certain substances that touch the skin. You either touched something that irritated your skin, or you have allergies to something you touched. Follow these instructions at home: [place] your skin as needed.  Apply cool compresses to the affected areas.  Try  taking a bath with: ? Epsom salts. Follow the instructions on the package. You can get these at a pharmacy or grocery store. ? Baking soda. Pour a small amount into the bath as told by your doctor. ? Colloidal oatmeal. Follow the instructions on the package. You can get this at a pharmacy or grocery store.  Try applying baking soda paste to your skin. Stir water into baking soda until it looks like paste.  Do not scratch your skin.  Bathe less often.  Bathe in lukewarm water. Avoid using hot water. Medicines  Take or apply over-the-counter and prescription medicines only as told by your doctor.  If you were prescribed an antibiotic medicine, take or apply your antibiotic as told by your doctor. Do not stop taking the antibiotic even if your condition starts to get better. General instructions  Keep all follow-up visits as told by your doctor. This is important.  Avoid the substance that caused your reaction. If you do not know what caused it, keep a journal to try to track what caused it. Write down: ? What you eat. ? What cosmetic products you use. ? What you drink. ? What you wear in the affected area. This includes jewelry.  If you were given a bandage (dressing), take care of it as told by your doctor. This includes when to change and remove it. Contact a doctor if:  You do not get better with treatment.  Your condition gets worse.  You have signs of infection such as: ? Swelling. ? Tenderness. ? Redness. ? Soreness. ? Warmth.  You have a fever.  You have new symptoms. Get help right away if:  You have a very bad headache.  You have neck pain.  Your neck is stiff.  You throw up (vomit).  You feel very sleepy.  You see red streaks coming from the affected area.  Your bone or joint underneath the affected area becomes painful after the skin has healed.  The affected area turns darker.  You have trouble breathing. This information is not intended to  replace advice given to you by your health care provider. Make sure you discuss any questions you have with your health care provider. Document Released: 10/09/2009 Document Revised: 05/19/2016 Document Reviewed: 04/29/2015 Elsevier Interactive Patient Education  2018 [employer].   IF you received an x-ray today, you will receive an invoice from [facility]. Please contact [facility] at [phone] with questions or concerns regarding your invoice.   IF you received labwork today, you will receive an invoice from [place]. Please contact [facility] at [phone] with questions or concerns regarding your invoice.   Our billing staff will not be able to assist you with questions regarding bills from these companies.  You will be contacted with the lab results as soon as they are available. The fastest way to get your results is to activate your My Chart account. Instructions are located on the last page of this paperwork. If you have not heard from [place] regarding the results in  2 weeks, please contact this office.

## 2017-06-15 ENCOUNTER — Ambulatory Visit (INDEPENDENT_AMBULATORY_CARE_PROVIDER_SITE_OTHER): Payer: Medicare Other | Admitting: Family Medicine

## 2017-06-15 VITALS — BP 172/69 | HR 95 | Temp 98.6°F | Resp 16 | Ht 60.0 in | Wt 135.2 lb

## 2017-06-15 DIAGNOSIS — B86 Scabies: Secondary | ICD-10-CM

## 2017-06-15 DIAGNOSIS — T7840XD Allergy, unspecified, subsequent encounter: Secondary | ICD-10-CM | POA: Diagnosis not present

## 2017-06-15 DIAGNOSIS — I10 Essential (primary) hypertension: Secondary | ICD-10-CM | POA: Diagnosis not present

## 2017-06-15 MED ORDER — PREDNISONE 10 MG PO TABS: ORAL_TABLET | ORAL | 0 refills | Status: DC

## 2017-06-15 MED ORDER — OLOPATADINE HCL 0.1 % OP SOLN: 1.0000 [drp] | Freq: Two times a day (BID) | OPHTHALMIC | 12 refills | Status: DC

## 2017-06-15 MED ORDER — ZOLPIDEM TARTRATE ER 6.25 MG PO TBCR: 6.2500 mg | EXTENDED_RELEASE_TABLET | Freq: Every evening | ORAL | 0 refills | Status: DC | PRN

## 2017-06-15 MED ORDER — PERMETHRIN 5 % EX CREA: TOPICAL_CREAM | CUTANEOUS | 0 refills | Status: DC

## 2017-06-15 NOTE — Patient Instructions (Addendum)
  Use the permethrin cream tonight before you go to bed. Wash it off when you wake up in the morning. Use this to cover basically your entire upper body but especially the area of the rash.  Repeat the Permethrin cream procedure again in 3 days (Sunday).  Tomorrow after you wash off the cream start taking the prednisone. Take this as follows:  Take 6 pills today, 5 pills tomorrow, 4 pills today after, 3 pills after, 2 pills after, 1 pill last day  Use the Patanol drops once the morning and once the evening. These are allergy eyedrops. Once the redness in her eyes has completely cleared up you can stop this.  If you're still having issues in the next week make sure to come back and see [place].  It was good to see you again today   IF you received an x-ray today, you will receive an invoice from [facility]. Please contact [facility] at [phone] with questions or concerns regarding your invoice.   IF you received labwork today, you will receive an invoice from [place]. Please contact [facility] at [phone] with questions or concerns regarding your invoice.   Our billing staff will not be able to assist you with questions regarding bills from these companies.  You will be contacted with the lab results as soon as they are available. The fastest way to get your results is to activate your My Chart account. Instructions are located on the last page of this paperwork. If you have not heard from [place] regarding the results in 2 weeks, please contact this office.

## 2017-06-15 NOTE — Progress Notes (Signed)
Gabrielle [name] is a 82 y.o. female who presents to Primary Care at [facility] today for eye itching and rash:  1.  Rash:  Patient seen here 5 days ago for the same. Noted itchy red rash started on her right upper extremity about a week before that. She's had no new contacts that she knows of. No new exposures. She does visit her husband who lives in a long-term skilled nursing facility and visits him on a daily basis.  She has tried over-the-counter creams without any relief of the itching. When she was seen here on the 21st she was started on hydroxyzine. She is also started on cetirizine. She tried these medicines once or twice but had no relief and therefore stopped them. She has not taken them since then. She has been using the triamcinolone cream without any improvement. She feels it is spread up into her shoulder. She has not had any further rash and itching that she knows of anywhere else on her body. No fevers or chills. Eating and drinking well. No drainage from the itching.  2.  Eye itching:  Started several days ago, after noticing the rash.  She noted redness and swelling around her eyes. She's had no difficulty with her vision. She has had some clear drainage and matting in the morning. No purulent drainage. Her eyes do itch. She was prescribed Naphcon on the 21st and has been using this several times a day without any relief. Reports this did not make it any better or worse. No foreign body sensation in her eyes. No pain in her eyes.  No difficulty breathing.  No lip tingling.  No oral lesions or throat closure/tingling.  No difficulty eating/drinking/speaking.   ROS as above.    PMH reviewed. Patient is a nonsmoker.   Past Medical History:  Diagnosis Date  . Arthritis   . Azotemia   . Carotid artery disease ([place])   . Claudication ([place])   . CVA (cerebral vascular accident) ([place])    [place] DIZZINESS 2008  . Diabetes mellitus    1990  . Essential hypertension, benign   . History of  anemia   . History of gout   . Hyperlipidemia   . Neck pain   . PAD (peripheral artery disease) ([facility])    hx. LCEA, hx Lt renal art. stenting, occl rt renal artery, occluded bil SFAs, moderatie iliac disease,    . Renal cell cancer ([place]) 2001  . Seizure [facility])    Past Surgical History:  Procedure Laterality Date  . CAROTID ENDARTERECTOMY     LEFT  . NEPHRECTOMY     RIGHT  . NM MYOCAR PERF WALL MOTION  07/12/2011   normal  . PV angiogram  2004   Lt renal artery stent  . URETERAL STENT PLACEMENT     LEFT    Medications reviewed. Current Outpatient Prescriptions  Medication Sig Dispense Refill  . amLODipine (NORVASC) 5 MG tablet Take 1 tablet (5 mg total) by mouth daily. 90 tablet 3  . aspirin 81 MG tablet Take 81 mg by mouth daily.    Marland Kitchen atorvastatin (LIPITOR) 20 MG tablet Take 1 tablet (20 mg total) by mouth daily. 90 tablet 3  . clopidogrel (PLAVIX) 75 MG tablet Take 1 tablet (75 mg total) by mouth daily. 90 tablet 2  . ferrous sulfate 325 (65 FE) MG tablet Take 1 tablet (325 mg total) by mouth daily with breakfast. 30 tablet 3  . HYDROcodone-acetaminophen (NORCO) 10-325 MG per  tablet Take 1 tablet by mouth every 6 (six) hours as needed for severe pain.    Marland Kitchen lisinopril (PRINIVIL,ZESTRIL) 40 MG tablet Take 1 tablet (40 mg total) by mouth daily. 90 tablet 3  . naphazoline-pheniramine (NAPHCON-A) 0.025-0.3 % ophthalmic solution Place 1 drop into both eyes 4 (four) times daily as needed for irritation. 15 mL 0  . Pumpkin Seed-Soy Germ (AZO BLADDER CONTROL/GO-LESS PO) Take 1 tablet by mouth daily.     Marland Kitchen triamcinolone cream (KENALOG) 0.1 % Apply 1 application topically 2 (two) times daily. 30 g 0  . zolpidem (AMBIEN CR) 12.5 MG CR tablet Take 1 tablet (12.5 mg total) by mouth at bedtime as needed for sleep. 30 tablet 4  . cetirizine (ZYRTEC) 5 MG tablet Take 1 tablet (5 mg total) by mouth daily. (Patient not taking: Reported on 06/15/2017) 30 tablet 1  . hydrOXYzine (ATARAX/VISTARIL) 25 MG  tablet Take 1 tablet (25 mg total) by mouth daily as needed for itching (Take 0.5-1 pill at bedtime prn for itching). Take 0.5-1 pill at bedtime prn for itching (Patient not taking: Reported on 06/15/2017) 30 tablet 0  . ondansetron (ZOFRAN) 4 MG tablet Take 1 tablet (4 mg total) by mouth every 6 (six) hours. (Patient not taking: Reported on 06/15/2017) 12 tablet 0  . Suvorexant (BELSOMRA) 10 MG TABS Take 10 mg by mouth at bedtime. (Patient not taking: Reported on 06/15/2017) 30 tablet 0  . temazepam (RESTORIL) 15 MG capsule 1 qhs may repeat (Patient not taking: Reported on 06/15/2017) 30 capsule 4   No current facility-administered medications for this visit.      Physical Exam:  BP (!) 172/69   Pulse 95   Temp 98.6 F (37 C) (Oral)   Resp 16   Ht 5' (1.524 m)   Wt 135 lb 3.2 oz (61.3 kg)   SpO2 99%   BMI 26.40 kg/m  Gen:  Alert, cooperative patient who appears stated age in no acute distress.  Vital signs reviewed. Head: [place]/AT.   Eyes:  EOMI, PERRL. No scleral injection -- actual eyes look great.  No pain with extraocular movement.  She does have some mild erythema and edema peri-orbitally BL.  Nontender to palpation of peri-orbital region Mouth:  MMM, tonsils non-erythematous, non-edematous.   Neck:  No LAD Pulm:  Clear to auscultation bilaterally with good air movement.  No wheezes or rales noted.   Cardiac:  Regular rate and rhythm without murmur auscultated.  Good S1/S2. Skin:  Multiple pinpoint areas of erythematous papules and burrows Right UE along her medial and anterior aspect of bicep extending through to lateral shoulder.  None upper chest.  None Left arm.  Does not extend past elbow of Right arm.  No other skin lesions noted.    Assessment and Plan:  1.  Scabies: - I believe rash is most consistent with scabies in appearance.  Will treat as such - See AVS for full details.  Discussed in detail with patient.  Permethrin x 1 today, repeat in 3 days. - Start prednisone for  allergic reaction tomorrow.  Even if not scabies, this should clear her rash  2.  Allergic conjunctivitis: - no evidence of corneal abrasion based on history (no foreign body sensation, eyes look great, no scleral injection) - nontender peri-orbital region.  No evidence of preseptal/orbital cellulitis.  Not a skin infection - Agree this is likely allergic reaction -- I would venture she is having an unusual reaction to ongoing scabies infection as this  started several days after the skin manifestations - stop naphcon - start patanol.  She can stop this when redness resolves.    3.  Elevated BP: - has not taken her meds today - encouraged to take.  Asymptomatic - FU in 1 week for Bp recheck

## 2017-06-29 ENCOUNTER — Other Ambulatory Visit: Payer: Self-pay | Admitting: Emergency Medicine

## 2017-06-29 NOTE — Telephone Encounter (Signed)
Please call patient. Rx authorized. Needs to schedule follow-up and establish with new PCP since Dr. [name] has retired. Has previously seen Whitney McVey, PA-C and [place], [place].  Meds ordered this encounter  Medications  . pravastatin (PRAVACHOL) 40 MG tablet    Sig: TAKE ONE TABLET BY MOUTH ONCE DAILY    Dispense:  30 tablet    Refill:  0    Please notify patient that s/he needs an office visit +/- labsfor additional refills.

## 2017-06-30 ENCOUNTER — Other Ambulatory Visit: Payer: Self-pay | Admitting: Emergency Medicine

## 2017-07-12 ENCOUNTER — Ambulatory Visit (HOSPITAL_COMMUNITY): Payer: Self-pay | Admitting: Psychiatry

## 2017-08-08 ENCOUNTER — Ambulatory Visit (INDEPENDENT_AMBULATORY_CARE_PROVIDER_SITE_OTHER): Payer: Medicare Other | Admitting: Physician Assistant

## 2017-08-08 ENCOUNTER — Encounter: Payer: Self-pay | Admitting: Physician Assistant

## 2017-08-08 VITALS — BP 128/56 | HR 98 | Temp 99.0°F | Resp 16 | Ht 59.75 in | Wt 133.8 lb

## 2017-08-08 DIAGNOSIS — R21 Rash and other nonspecific skin eruption: Secondary | ICD-10-CM

## 2017-08-08 MED ORDER — TRIAMCINOLONE ACETONIDE 0.025 % EX CREA: 1.0000 "application " | TOPICAL_CREAM | Freq: Two times a day (BID) | CUTANEOUS | 0 refills | Status: DC

## 2017-08-08 MED ORDER — KETOCONAZOLE 2 % EX CREA: 1.0000 "application " | TOPICAL_CREAM | Freq: Every day | CUTANEOUS | 0 refills | Status: DC

## 2017-08-08 NOTE — Progress Notes (Signed)
KEVIN MARIO  MRN: [id number] DOB: 02/14/1935  Subjective:  Gabrielle [name] is a 82 y.o. female seen in office today for a chief complaint of lower eyelid dryness x months. Notes she has dealt with this for a while but she has had worsening itching and dryness x 3 days. Has been seen a few months ago for "eye itching" and was prescribed napchon-a, she then returned as this was not helping and received a Rx for patanol. She does not know if this helped. She denies acute injury, eye pain, eye redness, eye discharge, photophobia, visual disturbance, and foreign body sensation. Denies contact lens use. Has tried OTC eye drops but cannot recall what kind. Has hx of cataracts.  Denies recent exposure to new cosmetics,  chemicals, plants, laundry detergent, lotions, and soaps.Has hx of seasonal allergies but only takes zyrtec occasionally. Received shots years ago for 5 years.   Review of Systems  Constitutional: Negative for chills, diaphoresis and fever.    Patient Active Problem List   Diagnosis Date Noted  . Iron deficiency anemia 07/08/2016  . Chronic insomnia 12/10/2015  . Spinal stenosis of lumbar region 05/21/2014  . Carotid artery disease ([place]) 02/26/2014  . PAD (peripheral artery disease) ([place])   . Renal cell cancer ([place])   . Diabetes mellitus   . Essential hypertension, benign   . Hyperlipidemia   . History of anemia   . CVA (cerebral vascular accident) [facility])     Current Outpatient Prescriptions on File Prior to Visit  Medication Sig Dispense Refill  . amLODipine (NORVASC) 5 MG tablet Take 1 tablet (5 mg total) by mouth daily. 90 tablet 3  . aspirin 81 MG tablet Take 81 mg by mouth daily.    Marland Kitchen atorvastatin (LIPITOR) 20 MG tablet Take 1 tablet (20 mg total) by mouth daily. 90 tablet 3  . cetirizine (ZYRTEC) 5 MG tablet Take 1 tablet (5 mg total) by mouth daily. 30 tablet 1  . clopidogrel (PLAVIX) 75 MG tablet Take 1 tablet (75 mg total) by mouth daily. 90 tablet 2  .  ferrous sulfate 325 (65 FE) MG tablet Take 1 tablet (325 mg total) by mouth daily with breakfast. 30 tablet 3  . HYDROcodone-acetaminophen (NORCO) 10-325 MG per tablet Take 1 tablet by mouth every 6 (six) hours as needed for severe pain.    Marland Kitchen lisinopril (PRINIVIL,ZESTRIL) 40 MG tablet Take 1 tablet (40 mg total) by mouth daily. 90 tablet 3  . zolpidem (AMBIEN CR) 6.25 MG CR tablet Take 1 tablet (6.25 mg total) by mouth at bedtime as needed for sleep. 14 tablet 0  . naphazoline-pheniramine (NAPHCON-A) 0.025-0.3 % ophthalmic solution Place 1 drop into both eyes 4 (four) times daily as needed for irritation. (Patient not taking: Reported on 08/08/2017) 15 mL 0  . olopatadine (PATANOL) 0.1 % ophthalmic solution Place 1 drop into both eyes 2 (two) times daily. (Patient not taking: Reported on 08/08/2017) 5 mL 12  . ondansetron (ZOFRAN) 4 MG tablet Take 1 tablet (4 mg total) by mouth every 6 (six) hours. (Patient not taking: Reported on 08/08/2017) 12 tablet 0  . permethrin (ELIMITE) 5 % cream Cover affected area and upper body.  Repeat in 3 days.  Dispense QS x 2 treatments (Patient not taking: Reported on 08/08/2017) 60 g 0  . pravastatin (PRAVACHOL) 40 MG tablet TAKE ONE TABLET BY MOUTH ONCE DAILY (Patient not taking: Reported on 08/08/2017) 30 tablet 0  . Pumpkin Seed-Soy Germ (AZO BLADDER CONTROL/GO-LESS PO)  Take 1 tablet by mouth daily.     Marland Kitchen triamcinolone cream (KENALOG) 0.1 % Apply 1 application topically 2 (two) times daily. (Patient not taking: Reported on 08/08/2017) 30 g 0   No current facility-administered medications on file prior to visit.     Allergies  Allergen Reactions  . Codeine Nausea And Vomiting  . Coreg [Carvedilol] Nausea And Vomiting  . Fentanyl   . Lacosamide      Other name is, VIMPAT  . Levetiracetam Other (See Comments)    Strange feelings in head  . Metformin And Related   . Metoprolol   . Morphine And Related Itching  . Oxybutynin Itching  . Sertraline Nausea Only and  Other (See Comments)    Swelling in mouth  . Tessalon [Benzonatate]   . Other Rash    BETA BLOCKER     Objective:  BP (!) 128/56 (BP Location: Left Arm, Cuff Size: Normal)   Pulse 98   Temp 99 F (37.2 C) (Oral)   Resp 16   Ht 4' 11.75" (1.518 m)   Wt 133 lb 12.8 oz (60.7 kg)   SpO2 97%   BMI 26.35 kg/m    Visual Acuity Screening   Right eye Left eye Both eyes  Without correction: 20/30 20/40 20/30   With correction:       Physical Exam  Constitutional: She is oriented to person, place, and time and well-developed, well-nourished, and in no distress.  HENT:  Head: Normocephalic and atraumatic.  Right Ear: Tympanic membrane, external ear and ear canal normal.  Left Ear: Tympanic membrane, external ear and ear canal normal.  Nose: Rhinorrhea present. No mucosal edema.  Eyes: Pupils are equal, round, and reactive to light. Conjunctivae and EOM are normal. Left eye exhibits no discharge and no hordeolum. Right conjunctiva is not injected. Left conjunctiva is not injected.  Erythematous dry scaly skin noted superior and inferior upper and lower eyelids bilateally. Flaky skin noted inferior to lower eyelids, more prominent on left lower orbit. No periorbital tenderness palpated.   Neck: Normal range of motion.  Pulmonary/Chest: Effort normal.  Lymphadenopathy:       Head (right side): No submental, no submandibular, no tonsillar, no preauricular, no posterior auricular and no occipital adenopathy present.       Head (left side): No submental, no submandibular, no tonsillar, no preauricular, no posterior auricular and no occipital adenopathy present.    She has no cervical adenopathy.       Right: No supraclavicular adenopathy present.       Left: No supraclavicular adenopathy present.  Neurological: She is alert and oriented to person, place, and time. Gait normal.  Skin: Skin is warm and dry.  Psychiatric: Affect normal.  Vitals reviewed.  Assessment and Plan :  1. Rash and  nonspecific skin eruption DDx includes either eyelid eczema, atopic dermatitis, or seborrheic dermatitis. Will treat with low potency corticosteroid and antifungal due to duration of symptoms. Pt instructed to return to clinic if symptoms worsen, do not improve in 1-2 weeks, or as needed. - triamcinolone (KENALOG) 0.025 % cream; Apply 1 application topically 2 (two) times daily.  Dispense: 30 g; Refill: 0 - ketoconazole (NIZORAL) 2 % cream; Apply 1 application topically daily.  Dispense: 15 g; Refill: 0  Tenna Delaine, PA-C  [facility] at [facility] 08/08/2017 1:59 PM

## 2017-08-08 NOTE — Patient Instructions (Addendum)
Please use topical steroid cream twice daily to affected skin area and topical antifungal once daily to affected skin area. Do not put the cream in your eyes. I also recommend using daily allergy pill for occasional sneezing. For your dry skin on your arms, buy over the counter eucerin cream and apply in the morning and in the evening. Return next week for a complete physical exam. Make sure you are fasting at this visit, I.e., no food for 8 hours prior to arrival. Also, bring your pill bottles to this appointment. Thank you for letting me participate in your health and well being.   Seborrheic Dermatitis, Adult Seborrheic dermatitis is a skin disease that causes red, scaly patches. It usually occurs on the scalp, and it is often called dandruff. The patches may appear on other parts of the body. Skin patches tend to appear where there are many oil glands in the skin. Areas of the body that are commonly affected include:  Scalp.  Skin folds of the body.  Ears.  Eyebrows.  Neck.  Face.  Armpits.  The bearded area of men's faces.  The condition may come and go for no known reason, and it is often long-lasting (chronic). What are the causes? The cause of this condition is not known. What increases the risk? This condition is more likely to develop in people who:  Have certain conditions, such as: ? HIV (human immunodeficiency virus). ? AIDS (acquired immunodeficiency syndrome). ? Parkinson disease. ? Mood disorders, such as depression.  Are 66-46 years old.  What are the signs or symptoms? Symptoms of this condition include:  Thick scales on the scalp.  Redness on the face or in the armpits.  Skin that is flaky. The flakes may be white or yellow.  Skin that seems oily or dry but is not helped with moisturizers.  Itching or burning in the affected areas.  How is this diagnosed? This condition is diagnosed with a medical history and physical exam. A sample of your skin may  be tested (skin biopsy). You may need to see a skin specialist (dermatologist). How is this treated? There is no cure for this condition, but treatment can help to manage the symptoms. You may get treatment to remove scales, lower the risk of skin infection, and reduce swelling or itching. Treatment may include:  Creams that reduce swelling and irritation (steroids).  Creams that reduce skin yeast.  Medicated shampoo, soaps, moisturizing creams, or ointments.  Medicated moisturizing creams or ointments.  Follow these instructions at home:  Apply over-the-counter and prescription medicines only as told by your health care provider.  Use any medicated shampoo, soaps, skin creams, or ointments only as told by your health care provider.  Keep all follow-up visits as told by your health care provider. This is important. Contact a health care provider if:  Your symptoms do not improve with treatment.  Your symptoms get worse.  You have new symptoms. This information is not intended to replace advice given to you by your health care provider. Make sure you discuss any questions you have with your health care provider. Document Released: 12/12/2005 Document Revised: 07/01/2016 Document Reviewed: 03/31/2016 Elsevier Interactive Patient Education  2018 [employer].   IF you received an x-ray today, you will receive an invoice from [facility]. Please contact [facility] at [phone] with questions or concerns regarding your invoice.   IF you received labwork today, you will receive an invoice from [facility]. Please contact [facility] at [phone] with  questions or concerns regarding your invoice.   Our billing staff will not be able to assist you with questions regarding bills from these companies.  You will be contacted with the lab results as soon as they are available. The fastest way to get your results is to activate your My Chart account. Instructions are  located on the last page of this paperwork. If you have not heard from [place] regarding the results in 2 weeks, please contact this office.

## 2017-09-13 ENCOUNTER — Ambulatory Visit (HOSPITAL_COMMUNITY): Payer: Medicare Other | Admitting: Psychiatry

## 2017-09-19 ENCOUNTER — Telehealth: Payer: Self-pay | Admitting: Family Medicine

## 2017-09-19 ENCOUNTER — Telehealth: Payer: Self-pay | Admitting: *Deleted

## 2017-09-19 NOTE — Telephone Encounter (Signed)
Pt states she needs Amlodipine 5mg , #30, 0 refill and Glipizide 2.5 mg,#15,0 refill ok'd to [facility], spoke with [employer]. Pt has appt schedule for Sat. 09/23/17. She stated that her husband is in the Nursing Home and she doesn't know what is going to happen. She was advised to call the office if she is unable to keep her appt.

## 2017-09-19 NOTE — Telephone Encounter (Signed)
Pt is needing to talk with someone regarding a prescription   Best number [phone]

## 2017-09-20 ENCOUNTER — Ambulatory Visit (INDEPENDENT_AMBULATORY_CARE_PROVIDER_SITE_OTHER): Payer: Medicare Other | Admitting: Emergency Medicine

## 2017-09-20 ENCOUNTER — Ambulatory Visit (INDEPENDENT_AMBULATORY_CARE_PROVIDER_SITE_OTHER): Payer: Medicare Other

## 2017-09-20 ENCOUNTER — Encounter: Payer: Self-pay | Admitting: Emergency Medicine

## 2017-09-20 VITALS — BP 161/69 | HR 87 | Temp 98.2°F | Resp 16 | Ht 59.0 in | Wt 131.0 lb

## 2017-09-20 DIAGNOSIS — S8011XA Contusion of right lower leg, initial encounter: Secondary | ICD-10-CM

## 2017-09-20 DIAGNOSIS — T148XXA Other injury of unspecified body region, initial encounter: Secondary | ICD-10-CM | POA: Insufficient documentation

## 2017-09-20 DIAGNOSIS — M79661 Pain in right lower leg: Secondary | ICD-10-CM | POA: Diagnosis not present

## 2017-09-20 DIAGNOSIS — M79604 Pain in right leg: Secondary | ICD-10-CM

## 2017-09-20 DIAGNOSIS — M25551 Pain in right hip: Secondary | ICD-10-CM | POA: Diagnosis not present

## 2017-09-20 DIAGNOSIS — S8991XA Unspecified injury of right lower leg, initial encounter: Secondary | ICD-10-CM | POA: Diagnosis not present

## 2017-09-20 DIAGNOSIS — Z23 Encounter for immunization: Secondary | ICD-10-CM

## 2017-09-20 DIAGNOSIS — S79911A Unspecified injury of right hip, initial encounter: Secondary | ICD-10-CM | POA: Diagnosis not present

## 2017-09-20 MED ORDER — HYDROCODONE-ACETAMINOPHEN 5-325 MG PO TABS: 1.0000 | ORAL_TABLET | Freq: Four times a day (QID) | ORAL | 0 refills | Status: DC | PRN

## 2017-09-20 NOTE — Progress Notes (Signed)
Gabrielle [name] 82 y.o.   Chief Complaint  Patient presents with  . Fall    right leg and right hand injury due to fall on concrete today, bruising with some lacerations     HISTORY OF PRESENT ILLNESS: This is a 82 y.o. female fell this am after tripping on an object; injured right lower leg; c/o pain and swelling with abrasions. Denies head injury or any other significant symptoms.  HPI   Prior to Admission medications   Medication Sig Start Date End Date Taking? Authorizing Provider  amLODipine (NORVASC) 5 MG tablet Take 1 tablet (5 mg total) by mouth daily. 09/12/16  Yes Darlyne Russian, MD  aspirin 81 MG tablet Take 81 mg by mouth daily.   Yes [provider]  atorvastatin (LIPITOR) 20 MG tablet Take 1 tablet (20 mg total) by mouth daily. 06/20/16  Yes Darlyne Russian, MD  cetirizine (ZYRTEC) 5 MG tablet Take 1 tablet (5 mg total) by mouth daily. 06/10/17  Yes Tenna Delaine D, PA-C  clopidogrel (PLAVIX) 75 MG tablet Take 1 tablet (75 mg total) by mouth daily. 09/12/16  Yes Darlyne Russian, MD  lisinopril (PRINIVIL,ZESTRIL) 40 MG tablet Take 1 tablet (40 mg total) by mouth daily. 09/12/16  Yes Darlyne Russian, MD  zolpidem (AMBIEN CR) 6.25 MG CR tablet Take 1 tablet (6.25 mg total) by mouth at bedtime as needed for sleep. 06/15/17  Yes Alveda Reasons, MD  ferrous sulfate 325 (65 FE) MG tablet Take 1 tablet (325 mg total) by mouth daily with breakfast. Patient not taking: Reported on 09/20/2017 05/03/16   Darlyne Russian, MD  HYDROcodone-acetaminophen (NORCO) 5-325 MG tablet Take 1 tablet by mouth every 6 (six) hours as needed for moderate pain. 09/20/17   Horald Pollen, MD  ketoconazole (NIZORAL) 2 % cream Apply 1 application topically daily. Patient not taking: Reported on 09/20/2017 08/08/17   Tenna Delaine D, PA-C  naphazoline-pheniramine (NAPHCON-A) 0.025-0.3 % ophthalmic solution Place 1 drop into both eyes 4 (four) times daily as needed for irritation. Patient not  taking: Reported on 08/08/2017 06/10/17   Tenna Delaine D, PA-C  olopatadine (PATANOL) 0.1 % ophthalmic solution Place 1 drop into both eyes 2 (two) times daily. Patient not taking: Reported on 08/08/2017 06/15/17   Alveda Reasons, MD  ondansetron (ZOFRAN) 4 MG tablet Take 1 tablet (4 mg total) by mouth every 6 (six) hours. Patient not taking: Reported on 08/08/2017 03/10/17   Milton Ferguson, MD  permethrin (ELIMITE) 5 % cream Cover affected area and upper body.  Repeat in 3 days.  Dispense QS x 2 treatments Patient not taking: Reported on 08/08/2017 06/15/17   Alveda Reasons, MD  pravastatin (PRAVACHOL) 40 MG tablet TAKE ONE TABLET BY MOUTH ONCE DAILY Patient not taking: Reported on 08/08/2017 06/29/17   Harrison Mons, PA-C  Pumpkin Seed-Soy Germ (AZO BLADDER CONTROL/GO-LESS PO) Take 1 tablet by mouth daily.     [provider]  triamcinolone (KENALOG) 0.025 % cream Apply 1 application topically 2 (two) times daily. Patient not taking: Reported on 09/20/2017 08/08/17   Tenna Delaine D, PA-C    Allergies  Allergen Reactions  . Codeine Nausea And Vomiting  . Coreg [Carvedilol] Nausea And Vomiting  . Fentanyl   . Lacosamide      Other name is, VIMPAT  . Levetiracetam Other (See Comments)    Strange feelings in head  . Metformin And Related   . Metoprolol   . Morphine And  Related Itching  . Oxybutynin Itching  . Sertraline Nausea Only and Other (See Comments)    Swelling in mouth  . Tessalon [Benzonatate]   . Other Rash    BETA BLOCKER    Patient Active Problem List   Diagnosis Date Noted  . Iron deficiency anemia 07/08/2016  . Chronic insomnia 12/10/2015  . Spinal stenosis of lumbar region 05/21/2014  . Carotid artery disease ([place]) 02/26/2014  . PAD (peripheral artery disease) ([place])   . Renal cell cancer ([place])   . Diabetes mellitus   . Essential hypertension, benign   . Hyperlipidemia   . History of anemia   . CVA (cerebral vascular accident) [facility])     Past  Medical History:  Diagnosis Date  . Arthritis   . Azotemia   . Carotid artery disease ([place])   . Claudication ([place])   . CVA (cerebral vascular accident) ([place])    [place] DIZZINESS 2008  . Diabetes mellitus    1990  . Essential hypertension, benign   . History of anemia   . History of gout   . Hyperlipidemia   . Neck pain   . PAD (peripheral artery disease) ([facility])    hx. LCEA, hx Lt renal art. stenting, occl rt renal artery, occluded bil SFAs, moderatie iliac disease,    . Renal cell cancer ([place]) 2001  . Seizure [facility])     Past Surgical History:  Procedure Laterality Date  . CAROTID ENDARTERECTOMY     LEFT  . NEPHRECTOMY     RIGHT  . NM MYOCAR PERF WALL MOTION  07/12/2011   normal  . PV angiogram  2004   Lt renal artery stent  . URETERAL STENT PLACEMENT     LEFT    Social History   Social History  . Marital status: Married    Spouse name: N/A  . Number of children: 2  . Years of education: N/A   Occupational History  . Retired Retired   Social History Main Topics  . Smoking status: Former Smoker    Quit date: 12/26/2005  . Smokeless tobacco: Never Used  . Alcohol use No  . Drug use: No  . Sexual activity: No   Other Topics Concern  . Not on file   Social History Narrative  . No narrative on file    Family History  Problem Relation Age of Onset  . Cancer Mother   . Heart disease Father      Review of Systems  Constitutional: Negative.  Negative for chills and fever.  HENT: Negative.  Negative for nosebleeds.   Eyes: Negative.  Negative for blurred vision.  Respiratory: Negative for cough and shortness of breath.   Cardiovascular: Positive for leg swelling. Negative for chest pain and palpitations.  Gastrointestinal: Negative for abdominal pain, blood in stool, nausea and vomiting.  Genitourinary: Negative.  Negative for hematuria.  Musculoskeletal: Positive for joint pain.  Skin: Positive for rash.  Neurological: Negative for dizziness, sensory  change, focal weakness and headaches.  Endo/Heme/Allergies: Bruises/bleeds easily.  All other systems reviewed and are negative.  Vitals:   09/20/17 1219 09/20/17 1255  BP: (!) 160/72 (!) 161/69  Pulse: 87   Resp: 16   Temp: 98.2 F (36.8 C)   SpO2: 99%      Physical Exam  Constitutional: She is oriented to person, place, and time. She appears well-developed and well-nourished.  HENT:  Head: Normocephalic and atraumatic.  Eyes: Pupils are equal, round, and reactive to light. EOM are  normal.  Neck: Normal range of motion. Neck supple.  Cardiovascular: Normal rate and regular rhythm.   Pulmonary/Chest: Effort normal and breath sounds normal.  Abdominal: Soft. There is no tenderness.  Musculoskeletal:  RUE: +bruising to biceps and right hand; FROM RLE: +abrasions with tender hematoma to proximal lower leg; NVI with FROM  Neurological: She is alert and oriented to person, place, and time. No sensory deficit. She exhibits normal muscle tone.  Skin: Skin is warm and dry. Capillary refill takes less than 2 seconds.  Psychiatric: She has a normal mood and affect.  Vitals reviewed.  Dg Tibia/fibula Right  Result Date: 09/20/2017 CLINICAL DATA:  Hip trauma with subsequent pain. EXAM: RIGHT TIBIA AND FIBULA - 2 VIEW; DG HIP (WITH OR WITHOUT PELVIS) 2-3V RIGHT COMPARISON:  Coronal and sagittal CT images through the pelvis and right hip dated June 28, 2016. FINDINGS: Right hip: The bones are subjectively adequately mineralized. There is no lytic or blastic lesion. AP and lateral views of the right hip reveal preservation of the joint space. The femoral head, neck, intertrochanteric, and subtrochanteric regions are normal. Right tibia and fibula: The bones are subjectively adequately mineralized. There is no acute fracture or dislocation. There is no lytic or blastic lesion or periosteal reaction. The observed portions of the right knee are normal. There is mild narrowing of the ankle joint  mortise. The soft tissues are unremarkable. IMPRESSION: There is no acute or significant chronic bony abnormality of the right hip. There is no acute or significant chronic bony abnormality of the right tibia or fibula. Electronically Signed   By: David  Martinique M.D.   On: 09/20/2017 12:48   Dg Hip Unilat W Or W/o Pelvis 2-3 Views Right  Result Date: 09/20/2017 CLINICAL DATA:  Hip trauma with subsequent pain. EXAM: RIGHT TIBIA AND FIBULA - 2 VIEW; DG HIP (WITH OR WITHOUT PELVIS) 2-3V RIGHT COMPARISON:  Coronal and sagittal CT images through the pelvis and right hip dated June 28, 2016. FINDINGS: Right hip: The bones are subjectively adequately mineralized. There is no lytic or blastic lesion. AP and lateral views of the right hip reveal preservation of the joint space. The femoral head, neck, intertrochanteric, and subtrochanteric regions are normal. Right tibia and fibula: The bones are subjectively adequately mineralized. There is no acute fracture or dislocation. There is no lytic or blastic lesion or periosteal reaction. The observed portions of the right knee are normal. There is mild narrowing of the ankle joint mortise. The soft tissues are unremarkable. IMPRESSION: There is no acute or significant chronic bony abnormality of the right hip. There is no acute or significant chronic bony abnormality of the right tibia or fibula. Electronically Signed   By: David  Martinique M.D.   On: 09/20/2017 12:48    ASSESSMENT & PLAN: Sheenah was seen today for fall.  Diagnoses and all orders for this visit:  Right leg pain -     DG Tibia/Fibula Right; Future -     DG HIP UNILAT W OR W/O PELVIS 2-3 VIEWS RIGHT; Future  Need for prophylactic vaccination and inoculation against influenza -     Flu Vaccine QUAD 36+ mos IM  Injury of right lower extremity, initial encounter  Traumatic hematoma of right lower leg, initial encounter  Contusion of right leg, initial encounter  Abrasion  Other orders -      HYDROcodone-acetaminophen (NORCO) 5-325 MG tablet; Take 1 tablet by mouth every 6 (six) hours as needed for moderate pain.  Patient Instructions       IF you received an x-ray today, you will receive an invoice from [facility]. Please contact [facility] at [phone] with questions or concerns regarding your invoice.   IF you received labwork today, you will receive an invoice from [facility]. Please contact [facility] at [phone] with questions or concerns regarding your invoice.   Our billing staff will not be able to assist you with questions regarding bills from these companies.  You will be contacted with the lab results as soon as they are available. The fastest way to get your results is to activate your My Chart account. Instructions are located on the last page of this paperwork. If you have not heard from [place] regarding the results in 2 weeks, please contact this office.      How to Change Your Dressing A dressing is a material that is placed in and over wounds. A dressing helps your wound to heal by protecting it from:  Bacteria.  Worse injury.  Being too dry or too wet.  What are the risks? The sticky (adhesive) tape that is used with a dressing may make your skin sore or irritated, or it may cause a rash. These are the most common problems. However, more serious problems can develop, such as:  Bleeding.  Infection.  How to change your dressing Getting Ready to Change Your Dressing   Take a shower before you do the first dressing change of the day. If your doctor does not want your wound to get wet and your dressing is not waterproof, you may need to put plastic leak-proof sealing wrap on your dressing to protect it.  If needed, take pain medicine as told by your doctor 30 minutes before you change your dressing.  Set up a clean station for wound care. You will need: ? A plastic trash bag that is open and ready to use. ? Hand  sanitizer. ? Wound cleanser or salt-water solution (saline) as told by your doctor. ? New dressing material or bandages. Make sure to open the dressing package so the dressing stays on the inside of the package. You may also need these supplies in your clean station:  A box of vinyl gloves.  Tape.  Skin protectant. This may be a wipe, film, or spray.  Clean or germ-free (sterile) scissors.  A cotton-tipped applicator.  Taking Off Your Old Dressing  Wash your hands with soap and water. Dry your hands with a clean towel. If you cannot use soap and water, use hand sanitizer.  If you are using gloves, put on the gloves before you take off the dressing.  Gently take off any adhesive or tape by pulling it off in the direction of your hair growth. Only touch the outside edges of the dressing.  Take off the dressing. If the dressing sticks to your skin, wet the dressing with a germ-free salt-water solution. This helps it come off more easily.  Take off any gauze or packing in your wound.  Throw the old dressing supplies into the ready trash bag.  Take off your gloves. To take off each glove, grab the cuff with your other hand and turn the glove inside out. Put the gloves in the trash right away.  Wash your hands with soap and water. Dry your hands with a clean towel. If you cannot use soap and water, use hand sanitizer. Cleaning Your Wound  Follow instructions from your doctor about how to clean your wound. This  may include using a salt-water solution or recommended wound cleanser.  Do not use over-the-counter medicated or antiseptic creams, sprays, liquids, or dressings unless your doctor tells you to do that.  Use a clean gauze pad to clean the area fully with the salt-water solution or wound cleanser that your doctor recommends.  Throw the gauze pad into the trash bag.  Wash your hands with soap and water. Dry your hands with a clean towel. If you cannot use soap and water, use  hand sanitizer. Putting on the Dressing  If your doctor recommended a skin protectant, put it on the skin around the wound.  Cover the wound with the recommended dressing, such as a nonstick gauze or bandage. Make sure to touch only the outside edges of the dressing. Do not touch the inside of the dressing.  Attach the dressing so all sides stay in place. You may do this with the attached medical adhesive, roll gauze, or tape. If you use tape, do not wrap the tape all the way around your arm or leg.  Take off your gloves. Put them in the trash bag with the old dressing. Tie the bag shut and throw it away.  Wash your hands with soap and water. Dry your hands with a clean towel. If you cannot use soap and water, use hand sanitizer. Get help if:   You have new pain.  You have irritation, a rash, or itching around the wound or dressing.  Changing your dressing is painful.  Changing your dressing causes a lot of bleeding. Get help right away if:  You have very bad pain.  You have signs of infection, such as: ? More redness, swelling, or pain. ? More fluid or blood. ? Warmth. ? Pus or a bad smell. ? Red streaks leading from wound. ? A fever. This information is not intended to replace advice given to you by your health care provider. Make sure you discuss any questions you have with your health care provider. Document Released: 03/10/2009 Document Revised: 05/19/2016 Document Reviewed: 09/17/2015 Elsevier Interactive Patient Education  2018 [place].  Hematoma A hematoma is a collection of blood. The collection of blood can turn into a hard, painful lump under the skin. Your skin may turn blue or yellow if the hematoma is close to the surface of the skin. Most hematomas get better in a few days to weeks. Some hematomas are serious and need medical care. Hematomas can be very small or very big. Follow these instructions at home:  Apply ice to the injured area: ? Put ice in a  plastic bag. ? Place a towel between your skin and the bag. ? Leave the ice on for 20 minutes, 2-3 times a day for the first 1 to 2 days.  After the first 2 days, switch to using warm packs on the injured area.  Raise (elevate) the injured area to lessen pain and puffiness (swelling). You may also wrap the area with an elastic bandage. Make sure the bandage is not wrapped too tight.  If you have a painful hematoma on your leg or foot, you may use crutches for a couple days.  Only take medicines as told by your doctor. Get help right away if:  Your pain gets worse.  Your pain is not controlled with medicine.  You have a fever.  Your puffiness gets worse.  Your skin turns more blue or yellow.  Your skin over the hematoma breaks or starts bleeding.  Your hematoma  is in your chest or belly (abdomen) and you are short of breath, feel weak, or have a change in consciousness.  Your hematoma is on your scalp and you have a headache that gets worse or a change in alertness or consciousness. This information is not intended to replace advice given to you by your health care provider. Make sure you discuss any questions you have with your health care provider. Document Released: 01/19/2005 Document Revised: 05/19/2016 Document Reviewed: 05/22/2013 Elsevier Interactive Patient Education  2017 [place] An abrasion is a cut or scrape on the surface of your skin. An abrasion does not go through all of the layers of your skin. It is important to take good care of your abrasion to prevent infection. Follow these instructions at home: Medicines  Take or apply medicines only as told by your doctor.  If you were prescribed an antibiotic ointment, finish all of it even if you start to feel better. Wound care  Clean the wound with mild soap and water 2-3 times per day or as told by your doctor. Pat your wound dry with a clean towel. Do not rub it.  There are many ways to close  and cover a wound. Follow instructions from your doctor about: ? How to take care of your wound. ? When and how you should change your bandage (dressing). ? When and how you should take off your dressing.  Check your wound every day for signs of infection. Watch for: ? Redness, swelling, or pain. ? Fluid, blood, or pus. General instructions  Keep the dressing dry as told by your doctor. Do not take baths, swim, use a hot tub, or do anything that would put your wound underwater until your doctor says it is okay.  If there is swelling, raise (elevate) the injured area above the level of your heart while you are sitting or lying down.  Keep all follow-up visits as told by your doctor. This is important. Contact a doctor if:  You were given a tetanus shot and you have any of these where the needle went in: ? Swelling. ? Very bad pain. ? Redness. ? Bleeding.  Medicine does not help your pain.  You have any of these at the site of the wound: ? More redness. ? More swelling. ? More pain. Get help right away if:  You have a red streak going away from your wound.  You have a fever.  You have fluid, blood, or pus coming from your wound.  There is a bad smell coming from your wound. This information is not intended to replace advice given to you by your health care provider. Make sure you discuss any questions you have with your health care provider. Document Released: 05/30/2008 Document Revised: 05/19/2016 Document Reviewed: 12/10/2014 Elsevier Interactive Patient Education  2018 [place] A contusion is a deep bruise. Contusions happen when an injury causes bleeding under the skin. Symptoms of bruising include pain, swelling, and discolored skin. The skin may turn blue, purple, or yellow. Follow these instructions at home:  Rest the injured area.  If told, put ice on the injured area. ? Put ice in a plastic bag. ? Place a towel between your skin and the  bag. ? Leave the ice on for 20 minutes, 2-3 times per day.  If told, put light pressure (compression) on the injured area using an elastic bandage. Make sure the bandage is not too tight. Remove it and put it  back on as told by your doctor.  If possible, raise (elevate) the injured area above the level of your heart while you are sitting or lying down.  Take over-the-counter and prescription medicines only as told by your doctor. Contact a doctor if:  Your symptoms do not get better after several days of treatment.  Your symptoms get worse.  You have trouble moving the injured area. Get help right away if:  You have very bad pain.  You have a loss of feeling (numbness) in a hand or foot.  Your hand or foot turns pale or cold. This information is not intended to replace advice given to you by your health care provider. Make sure you discuss any questions you have with your health care provider. Document Released: 05/30/2008 Document Revised: 05/19/2016 Document Reviewed: 04/29/2015 Elsevier Interactive Patient Education  2018 [employer] Inc[employer]      Agustina Caroli, MD [facility]

## 2017-09-20 NOTE — Patient Instructions (Addendum)
IF you received an x-ray today, you will receive an invoice from [facility]. Please contact [facility] at [phone] with questions or concerns regarding your invoice.   IF you received labwork today, you will receive an invoice from [facility]. Please contact [facility] at [phone] with questions or concerns regarding your invoice.   Our billing staff will not be able to assist you with questions regarding bills from these companies.  You will be contacted with the lab results as soon as they are available. The fastest way to get your results is to activate your My Chart account. Instructions are located on the last page of this paperwork. If you have not heard from [place] regarding the results in 2 weeks, please contact this office.      How to Change Your Dressing A dressing is a material that is placed in and over wounds. A dressing helps your wound to heal by protecting it from:  Bacteria.  Worse injury.  Being too dry or too wet.  What are the risks? The sticky (adhesive) tape that is used with a dressing may make your skin sore or irritated, or it may cause a rash. These are the most common problems. However, more serious problems can develop, such as:  Bleeding.  Infection.  How to change your dressing Getting Ready to Change Your Dressing   Take a shower before you do the first dressing change of the day. If your doctor does not want your wound to get wet and your dressing is not waterproof, you may need to put plastic leak-proof sealing wrap on your dressing to protect it.  If needed, take pain medicine as told by your doctor 30 minutes before you change your dressing.  Set up a clean station for wound care. You will need: ? A plastic trash bag that is open and ready to use. ? Hand sanitizer. ? Wound cleanser or salt-water solution (saline) as told by your doctor. ? New dressing material or bandages. Make sure to open the dressing package so  the dressing stays on the inside of the package. You may also need these supplies in your clean station:  A box of vinyl gloves.  Tape.  Skin protectant. This may be a wipe, film, or spray.  Clean or germ-free (sterile) scissors.  A cotton-tipped applicator.  Taking Off Your Old Dressing  Wash your hands with soap and water. Dry your hands with a clean towel. If you cannot use soap and water, use hand sanitizer.  If you are using gloves, put on the gloves before you take off the dressing.  Gently take off any adhesive or tape by pulling it off in the direction of your hair growth. Only touch the outside edges of the dressing.  Take off the dressing. If the dressing sticks to your skin, wet the dressing with a germ-free salt-water solution. This helps it come off more easily.  Take off any gauze or packing in your wound.  Throw the old dressing supplies into the ready trash bag.  Take off your gloves. To take off each glove, grab the cuff with your other hand and turn the glove inside out. Put the gloves in the trash right away.  Wash your hands with soap and water. Dry your hands with a clean towel. If you cannot use soap and water, use hand sanitizer. Cleaning Your Wound  Follow instructions from your doctor about how to clean your wound. This may include using a salt-water  solution or recommended wound cleanser.  Do not use over-the-counter medicated or antiseptic creams, sprays, liquids, or dressings unless your doctor tells you to do that.  Use a clean gauze pad to clean the area fully with the salt-water solution or wound cleanser that your doctor recommends.  Throw the gauze pad into the trash bag.  Wash your hands with soap and water. Dry your hands with a clean towel. If you cannot use soap and water, use hand sanitizer. Putting on the Dressing  If your doctor recommended a skin protectant, put it on the skin around the wound.  Cover the wound with the recommended  dressing, such as a nonstick gauze or bandage. Make sure to touch only the outside edges of the dressing. Do not touch the inside of the dressing.  Attach the dressing so all sides stay in place. You may do this with the attached medical adhesive, roll gauze, or tape. If you use tape, do not wrap the tape all the way around your arm or leg.  Take off your gloves. Put them in the trash bag with the old dressing. Tie the bag shut and throw it away.  Wash your hands with soap and water. Dry your hands with a clean towel. If you cannot use soap and water, use hand sanitizer. Get help if:   You have new pain.  You have irritation, a rash, or itching around the wound or dressing.  Changing your dressing is painful.  Changing your dressing causes a lot of bleeding. Get help right away if:  You have very bad pain.  You have signs of infection, such as: ? More redness, swelling, or pain. ? More fluid or blood. ? Warmth. ? Pus or a bad smell. ? Red streaks leading from wound. ? A fever. This information is not intended to replace advice given to you by your health care provider. Make sure you discuss any questions you have with your health care provider. Document Released: 03/10/2009 Document Revised: 05/19/2016 Document Reviewed: 09/17/2015 Elsevier Interactive Patient Education  2018 [place].  Hematoma A hematoma is a collection of blood. The collection of blood can turn into a hard, painful lump under the skin. Your skin may turn blue or yellow if the hematoma is close to the surface of the skin. Most hematomas get better in a few days to weeks. Some hematomas are serious and need medical care. Hematomas can be very small or very big. Follow these instructions at home:  Apply ice to the injured area: ? Put ice in a plastic bag. ? Place a towel between your skin and the bag. ? Leave the ice on for 20 minutes, 2-3 times a day for the first 1 to 2 days.  After the first 2 days,  switch to using warm packs on the injured area.  Raise (elevate) the injured area to lessen pain and puffiness (swelling). You may also wrap the area with an elastic bandage. Make sure the bandage is not wrapped too tight.  If you have a painful hematoma on your leg or foot, you may use crutches for a couple days.  Only take medicines as told by your doctor. Get help right away if:  Your pain gets worse.  Your pain is not controlled with medicine.  You have a fever.  Your puffiness gets worse.  Your skin turns more blue or yellow.  Your skin over the hematoma breaks or starts bleeding.  Your hematoma is in your chest or  belly (abdomen) and you are short of breath, feel weak, or have a change in consciousness.  Your hematoma is on your scalp and you have a headache that gets worse or a change in alertness or consciousness. This information is not intended to replace advice given to you by your health care provider. Make sure you discuss any questions you have with your health care provider. Document Released: 01/19/2005 Document Revised: 05/19/2016 Document Reviewed: 05/22/2013 Elsevier Interactive Patient Education  2017 [place] An abrasion is a cut or scrape on the surface of your skin. An abrasion does not go through all of the layers of your skin. It is important to take good care of your abrasion to prevent infection. Follow these instructions at home: Medicines  Take or apply medicines only as told by your doctor.  If you were prescribed an antibiotic ointment, finish all of it even if you start to feel better. Wound care  Clean the wound with mild soap and water 2-3 times per day or as told by your doctor. Pat your wound dry with a clean towel. Do not rub it.  There are many ways to close and cover a wound. Follow instructions from your doctor about: ? How to take care of your wound. ? When and how you should change your bandage (dressing). ? When and  how you should take off your dressing.  Check your wound every day for signs of infection. Watch for: ? Redness, swelling, or pain. ? Fluid, blood, or pus. General instructions  Keep the dressing dry as told by your doctor. Do not take baths, swim, use a hot tub, or do anything that would put your wound underwater until your doctor says it is okay.  If there is swelling, raise (elevate) the injured area above the level of your heart while you are sitting or lying down.  Keep all follow-up visits as told by your doctor. This is important. Contact a doctor if:  You were given a tetanus shot and you have any of these where the needle went in: ? Swelling. ? Very bad pain. ? Redness. ? Bleeding.  Medicine does not help your pain.  You have any of these at the site of the wound: ? More redness. ? More swelling. ? More pain. Get help right away if:  You have a red streak going away from your wound.  You have a fever.  You have fluid, blood, or pus coming from your wound.  There is a bad smell coming from your wound. This information is not intended to replace advice given to you by your health care provider. Make sure you discuss any questions you have with your health care provider. Document Released: 05/30/2008 Document Revised: 05/19/2016 Document Reviewed: 12/10/2014 Elsevier Interactive Patient Education  2018 [place] A contusion is a deep bruise. Contusions happen when an injury causes bleeding under the skin. Symptoms of bruising include pain, swelling, and discolored skin. The skin may turn blue, purple, or yellow. Follow these instructions at home:  Rest the injured area.  If told, put ice on the injured area. ? Put ice in a plastic bag. ? Place a towel between your skin and the bag. ? Leave the ice on for 20 minutes, 2-3 times per day.  If told, put light pressure (compression) on the injured area using an elastic bandage. Make sure the bandage  is not too tight. Remove it and put it back on as told by  your doctor.  If possible, raise (elevate) the injured area above the level of your heart while you are sitting or lying down.  Take over-the-counter and prescription medicines only as told by your doctor. Contact a doctor if:  Your symptoms do not get better after several days of treatment.  Your symptoms get worse.  You have trouble moving the injured area. Get help right away if:  You have very bad pain.  You have a loss of feeling (numbness) in a hand or foot.  Your hand or foot turns pale or cold. This information is not intended to replace advice given to you by your health care provider. Make sure you discuss any questions you have with your health care provider. Document Released: 05/30/2008 Document Revised: 05/19/2016 Document Reviewed: 04/29/2015 Elsevier Interactive Patient Education  2018 [employer].

## 2017-09-23 ENCOUNTER — Ambulatory Visit: Payer: Medicare Other | Admitting: Family Medicine

## 2017-09-25 ENCOUNTER — Ambulatory Visit (INDEPENDENT_AMBULATORY_CARE_PROVIDER_SITE_OTHER): Payer: Medicare Other | Admitting: Family Medicine

## 2017-09-25 ENCOUNTER — Encounter: Payer: Self-pay | Admitting: Family Medicine

## 2017-09-25 VITALS — BP 130/62 | HR 80 | Temp 98.7°F | Resp 16 | Ht <= 58 in | Wt 131.4 lb

## 2017-09-25 DIAGNOSIS — Z0000 Encounter for general adult medical examination without abnormal findings: Secondary | ICD-10-CM

## 2017-09-25 DIAGNOSIS — E785 Hyperlipidemia, unspecified: Secondary | ICD-10-CM | POA: Diagnosis not present

## 2017-09-25 DIAGNOSIS — S8991XD Unspecified injury of right lower leg, subsequent encounter: Secondary | ICD-10-CM | POA: Diagnosis not present

## 2017-09-25 DIAGNOSIS — Z5181 Encounter for therapeutic drug level monitoring: Secondary | ICD-10-CM

## 2017-09-25 DIAGNOSIS — I10 Essential (primary) hypertension: Secondary | ICD-10-CM | POA: Diagnosis not present

## 2017-09-25 DIAGNOSIS — E119 Type 2 diabetes mellitus without complications: Secondary | ICD-10-CM

## 2017-09-25 MED ORDER — AMLODIPINE BESYLATE 5 MG PO TABS: 5.0000 mg | ORAL_TABLET | Freq: Every day | ORAL | 3 refills | Status: DC

## 2017-09-25 MED ORDER — CEPHALEXIN 500 MG PO CAPS: 500.0000 mg | ORAL_CAPSULE | Freq: Four times a day (QID) | ORAL | 0 refills | Status: AC

## 2017-09-25 MED ORDER — SILVER SULFADIAZINE 1 % EX CREA: 1.0000 "application " | TOPICAL_CREAM | Freq: Every day | CUTANEOUS | 0 refills | Status: DC

## 2017-09-25 MED ORDER — ZOLPIDEM TARTRATE ER 6.25 MG PO TBCR: 6.2500 mg | EXTENDED_RELEASE_TABLET | Freq: Every evening | ORAL | 0 refills | Status: DC | PRN

## 2017-09-25 MED ORDER — CLOPIDOGREL BISULFATE 75 MG PO TABS: 75.0000 mg | ORAL_TABLET | Freq: Every day | ORAL | 2 refills | Status: DC

## 2017-09-25 NOTE — Progress Notes (Signed)
No chief complaint on file.   HPI  Past Medical History:  Diagnosis Date  . Arthritis   . Azotemia   . Carotid artery disease ([place])   . Claudication ([place])   . CVA (cerebral vascular accident) ([place])    [place] DIZZINESS 2008  . Diabetes mellitus    1990  . Essential hypertension, benign   . History of anemia   . History of gout   . Hyperlipidemia   . Neck pain   . PAD (peripheral artery disease) ([facility])    hx. LCEA, hx Lt renal art. stenting, occl rt renal artery, occluded bil SFAs, moderatie iliac disease,    . Renal cell cancer ([place]) 2001  . Seizure [facility])     Current Outpatient Prescriptions  Medication Sig Dispense Refill  . amLODipine (NORVASC) 5 MG tablet Take 1 tablet (5 mg total) by mouth daily. 90 tablet 3  . aspirin 81 MG tablet Take 81 mg by mouth daily.    Marland Kitchen atorvastatin (LIPITOR) 20 MG tablet Take 1 tablet (20 mg total) by mouth daily. 90 tablet 3  . cetirizine (ZYRTEC) 5 MG tablet Take 1 tablet (5 mg total) by mouth daily. 30 tablet 1  . clopidogrel (PLAVIX) 75 MG tablet Take 1 tablet (75 mg total) by mouth daily. 90 tablet 2  . ferrous sulfate 325 (65 FE) MG tablet Take 1 tablet (325 mg total) by mouth daily with breakfast. (Patient not taking: Reported on 09/20/2017) 30 tablet 3  . HYDROcodone-acetaminophen (NORCO) 5-325 MG tablet Take 1 tablet by mouth every 6 (six) hours as needed for moderate pain. 15 tablet 0  . ketoconazole (NIZORAL) 2 % cream Apply 1 application topically daily. (Patient not taking: Reported on 09/20/2017) 15 g 0  . lisinopril (PRINIVIL,ZESTRIL) 40 MG tablet Take 1 tablet (40 mg total) by mouth daily. 90 tablet 3  . naphazoline-pheniramine (NAPHCON-A) 0.025-0.3 % ophthalmic solution Place 1 drop into both eyes 4 (four) times daily as needed for irritation. (Patient not taking: Reported on 08/08/2017) 15 mL 0  . olopatadine (PATANOL) 0.1 % ophthalmic solution Place 1 drop into both eyes 2 (two) times daily. (Patient not taking: Reported on 08/08/2017)  5 mL 12  . ondansetron (ZOFRAN) 4 MG tablet Take 1 tablet (4 mg total) by mouth every 6 (six) hours. (Patient not taking: Reported on 08/08/2017) 12 tablet 0  . permethrin (ELIMITE) 5 % cream Cover affected area and upper body.  Repeat in 3 days.  Dispense QS x 2 treatments (Patient not taking: Reported on 08/08/2017) 60 g 0  . pravastatin (PRAVACHOL) 40 MG tablet TAKE ONE TABLET BY MOUTH ONCE DAILY (Patient not taking: Reported on 08/08/2017) 30 tablet 0  . Pumpkin Seed-Soy Germ (AZO BLADDER CONTROL/GO-LESS PO) Take 1 tablet by mouth daily.     Marland Kitchen triamcinolone (KENALOG) 0.025 % cream Apply 1 application topically 2 (two) times daily. (Patient not taking: Reported on 09/20/2017) 30 g 0  . zolpidem (AMBIEN CR) 6.25 MG CR tablet Take 1 tablet (6.25 mg total) by mouth at bedtime as needed for sleep. 14 tablet 0   No current facility-administered medications for this visit.     Allergies:  Allergies  Allergen Reactions  . Codeine Nausea And Vomiting  . Coreg [Carvedilol] Nausea And Vomiting  . Fentanyl   . Lacosamide      Other name is, VIMPAT  . Levetiracetam Other (See Comments)    Strange feelings in head  . Metformin And Related   . Metoprolol   .  Morphine And Related Itching  . Oxybutynin Itching  . Sertraline Nausea Only and Other (See Comments)    Swelling in mouth  . Tessalon [Benzonatate]   . Other Rash    BETA BLOCKER    Past Surgical History:  Procedure Laterality Date  . CAROTID ENDARTERECTOMY     LEFT  . NEPHRECTOMY     RIGHT  . NM MYOCAR PERF WALL MOTION  07/12/2011   normal  . PV angiogram  2004   Lt renal artery stent  . URETERAL STENT PLACEMENT     LEFT    Social History   Social History  . Marital status: Married    Spouse name: N/A  . Number of children: 2  . Years of education: N/A   Occupational History  . Retired Retired   Social History Main Topics  . Smoking status: Former Smoker    Quit date: 12/26/2005  . Smokeless tobacco: Never Used  .  Alcohol use No  . Drug use: No  . Sexual activity: No   Other Topics Concern  . Not on file   Social History Narrative  . No narrative on file    ROS  Objective: There were no vitals filed for this visit.  Physical Exam  Assessment and Plan There are no diagnoses linked to this encounter.   Madysen Faircloth P [employer]

## 2017-09-25 NOTE — Progress Notes (Signed)
QUICK REFERENCE INFORMATION: The ABCs of Providing the Annual Wellness Visit  CMS.gov Medicare Learning Network  [facility] Wellness Visit  Subjective:   Gabrielle [name] is a 82 y.o. Female who presents for an Annual Wellness Visit.  Pt reports that she fell at home on 09/20/17 She was seen on the day of injury and continues to have leg pain On the left States that she has been cleaning her wound with peroxide and using neosporin. She denies fever or chlls but reports that her left leg is swollen and sore  She has diabetes that is well controlled She does not take any meds for this She needs a few med refills soon.   Her husband is critically ill in a nursing home She does not have time for multiple office visit.   Lab Results  Component Value Date   HGBA1C 6.3 09/12/2016   Lab Results  Component Value Date   CREATININE 1.67 (H) 03/10/2017    Patient Active Problem List   Diagnosis Date Noted  . Need for prophylactic vaccination and inoculation against influenza 09/20/2017  . Right leg pain 09/20/2017  . Contusion of right leg, initial encounter 09/20/2017  . Traumatic hematoma of right lower leg 09/20/2017  . Abrasion 09/20/2017  . Iron deficiency anemia 07/08/2016  . Chronic insomnia 12/10/2015  . Spinal stenosis of lumbar region 05/21/2014  . Carotid artery disease ([place]) 02/26/2014  . PAD (peripheral artery disease) ([place])   . Renal cell cancer ([place])   . Diabetes mellitus   . Essential hypertension, benign   . Hyperlipidemia   . History of anemia   . CVA (cerebral vascular accident) [facility])     Past Medical History:  Diagnosis Date  . Arthritis   . Azotemia   . Carotid artery disease ([place])   . Claudication ([place])   . CVA (cerebral vascular accident) ([place])    [place] DIZZINESS 2008  . Diabetes mellitus    1990  . Essential hypertension, benign   . History of anemia   . History of gout   . Hyperlipidemia   . Neck pain   . PAD (peripheral artery disease)  (HCC)    hx. LCEA, hx Lt renal art. stenting, occl rt renal artery, occluded bil SFAs, moderatie iliac disease,    . Renal cell cancer ([place]) 2001  . Seizure [facility])      Past Surgical History:  Procedure Laterality Date  . CAROTID ENDARTERECTOMY     LEFT  . NEPHRECTOMY     RIGHT  . NM MYOCAR PERF WALL MOTION  07/12/2011   normal  . PV angiogram  2004   Lt renal artery stent  . URETERAL STENT PLACEMENT     LEFT     Outpatient Medications Prior to Visit  Medication Sig Dispense Refill  . aspirin 81 MG tablet Take 81 mg by mouth daily.    . cetirizine (ZYRTEC) 5 MG tablet Take 1 tablet (5 mg total) by mouth daily. 30 tablet 1  . HYDROcodone-acetaminophen (NORCO) 5-325 MG tablet Take 1 tablet by mouth every 6 (six) hours as needed for moderate pain. 15 tablet 0  . amLODipine (NORVASC) 5 MG tablet Take 1 tablet (5 mg total) by mouth daily. 90 tablet 3  . clopidogrel (PLAVIX) 75 MG tablet Take 1 tablet (75 mg total) by mouth daily. 90 tablet 2  . zolpidem (AMBIEN CR) 6.25 MG CR tablet Take 1 tablet (6.25 mg total) by mouth at bedtime as needed for sleep. 14 tablet  0  . Pumpkin Seed-Soy Germ (AZO BLADDER CONTROL/GO-LESS PO) Take 1 tablet by mouth daily.     Marland Kitchen atorvastatin (LIPITOR) 20 MG tablet Take 1 tablet (20 mg total) by mouth daily. (Patient not taking: Reported on 09/25/2017) 90 tablet 3  . ferrous sulfate 325 (65 FE) MG tablet Take 1 tablet (325 mg total) by mouth daily with breakfast. (Patient not taking: Reported on 09/20/2017) 30 tablet 3  . ketoconazole (NIZORAL) 2 % cream Apply 1 application topically daily. (Patient not taking: Reported on 09/25/2017) 15 g 0  . lisinopril (PRINIVIL,ZESTRIL) 40 MG tablet Take 1 tablet (40 mg total) by mouth daily. (Patient not taking: Reported on 09/25/2017) 90 tablet 3  . naphazoline-pheniramine (NAPHCON-A) 0.025-0.3 % ophthalmic solution Place 1 drop into both eyes 4 (four) times daily as needed for irritation. (Patient not taking: Reported on  09/25/2017) 15 mL 0  . olopatadine (PATANOL) 0.1 % ophthalmic solution Place 1 drop into both eyes 2 (two) times daily. (Patient not taking: Reported on 09/25/2017) 5 mL 12  . ondansetron (ZOFRAN) 4 MG tablet Take 1 tablet (4 mg total) by mouth every 6 (six) hours. (Patient not taking: Reported on 09/25/2017) 12 tablet 0  . permethrin (ELIMITE) 5 % cream Cover affected area and upper body.  Repeat in 3 days.  Dispense QS x 2 treatments (Patient not taking: Reported on 08/08/2017) 60 g 0  . pravastatin (PRAVACHOL) 40 MG tablet TAKE ONE TABLET BY MOUTH ONCE DAILY (Patient not taking: Reported on 08/08/2017) 30 tablet 0  . triamcinolone (KENALOG) 0.025 % cream Apply 1 application topically 2 (two) times daily. (Patient not taking: Reported on 09/20/2017) 30 g 0   No facility-administered medications prior to visit.     Allergies  Allergen Reactions  . Codeine Nausea And Vomiting  . Coreg [Carvedilol] Nausea And Vomiting  . Fentanyl   . Lacosamide      Other name is, VIMPAT  . Levetiracetam Other (See Comments)    Strange feelings in head  . Metformin And Related   . Metoprolol   . Morphine And Related Itching  . Oxybutynin Itching  . Sertraline Nausea Only and Other (See Comments)    Swelling in mouth  . Tessalon [Benzonatate]   . Other Rash    BETA BLOCKER     Family History  Problem Relation Age of Onset  . Cancer Mother   . Heart disease Father      Social History   Social History  . Marital status: Married    Spouse name: N/A  . Number of children: 2  . Years of education: N/A   Occupational History  . Retired Retired   Social History Main Topics  . Smoking status: Former Smoker    Quit date: 12/26/2005  . Smokeless tobacco: Never Used  . Alcohol use No  . Drug use: No  . Sexual activity: No   Other Topics Concern  . None   Social History Narrative  . None      Recent Hospitalizations? no  Health Habits: Current exercise activities include:  none Exercise: 0 times/week. Diet: in general, a "healthy" diet    Alcohol intake: none  Health Risk Assessment: The patient has completed a Health Risk Assessment. This has been reveiwed with them and has been scanned into the [place] system as an attached document.  Current Medical Providers and Suppliers: [facility] Patient Care Team: Patient, No Pcp Per as PCP - General (General Practice) Lorretta Harp, MD (Cardiology) [place],  Annie Main, MD (Urology) Future Appointments Date Time Provider [place]  10/02/2017 10:00 AM Rutherford Guys, MD PCP-PCP [facility]  11/22/2017 3:00 PM Wardell Honour, MD PCP-PCP [facility]  11/24/2017 9:45 AM Norma Fredrickson, MD [facility] None     Age-appropriate Screening Schedule: The list below includes current immunization status and future screening recommendations based on patient's age. Orders for these recommended tests are listed in the plan section. The patient has been provided with a written plan. Immunization History  Administered Date(s) Administered  . Influenza Split 09/02/2011, 08/31/2012, 09/05/2015  . Influenza,inj,Quad PF,6+ Mos 09/07/2013, 09/26/2014, 08/15/2016, 09/20/2017  . Pneumococcal Conjugate-13 08/11/2015  . Pneumococcal-Unspecified 12/27/2007  . Td 12/26/2010    Depression Screen-PHQ2/9 completed today  Depression screen [facility] 2/9 09/25/2017 09/20/2017 08/08/2017 06/15/2017 06/10/2017  Decreased Interest 0 0 0 0 0  Down, Depressed, Hopeless 0 0 0 0 0  PHQ - 2 Score 0 0 0 0 0   Depression Severity and Treatment Recommendations:  0-4= None  5-9= Mild / Treatment: Support, educate to call if worse; return in one month  10-14= Moderate / Treatment: Support, watchful waiting; Antidepressant or Psycotherapy  15-19= Moderately severe / Treatment: Antidepressant OR Psychotherapy  >= 20 = Major depression, severe / Antidepressant AND Psychotherapy  Functional Status Survey: Is the patient deaf or have difficulty hearing?: No Does the  patient have difficulty seeing, even when wearing glasses/contacts?: No Does the patient have difficulty concentrating, remembering, or making decisions?: No Does the patient have difficulty walking or climbing stairs?: No Does the patient have difficulty dressing or bathing?: No Does the patient have difficulty doing errands alone such as visiting a doctor's office or shopping?: No  Hearing Evaluation: 1. Do you have trouble hearing the television when others do not? no 2. Do you have to strain to hear/understand conversations? no   Advanced Care Planning: 1. Patient has executed an Advance Directive: yes  2. If no, patient was given the opportunity to execute an Advance Directive today? n/a  3. Are the patient's advanced directives in [place]? no 4. This patient has the ability to prepare an Advance Directive: yes 5. Provider is willing to follow the patient's wishes: yes  Cognitive Assessment: Does the patient have evidence of cognitive impairment? no The patient does not have any evidence of any cognitive problems and denies any  change in mood/affect, appearance, speech, memory or motor skills.  Identification of Risk Factors: Risk factors include: diabetes mellitus and hypertension  ROS  Objective:   Vitals:   09/25/17 1338  BP: 130/62  Pulse: 80  Resp: 16  Temp: 98.7 F (37.1 C)  TempSrc: Oral  SpO2: 95%  Weight: 131 lb 6.4 oz (59.6 kg)  Height: 4\' 10"  (1.473 m)    Body mass index is 27.46 kg/m.  General appearance: alert and appears stated age Eyes: conjunctivae/corneas clear. PERRL, EOM's intact. Fundi benign. Ears: normal TM's and external ear canals both ears Lungs: clear to auscultation bilaterally Heart: regular rate and rhythm, S1, S2 normal, no murmur, click, rub or gallop Abdomen: soft, non-tender; bowel sounds normal; no masses,  no organomegaly Extremities: left knee with bruising as well as bullae and excoriation. see photo    Assessment/Plan:     Patient Self-Management and Personalized Health Advice The patient has been provided with information about: {continue current healthy lifestyle patterns  During the course of the visit the patient was educated and counseled about appropriate screening and preventive services including:   return annually or prn  Body mass index is 27.46 kg/m. Discussed the patient's BMI with her. The BMI BMI is not in the acceptable range; no BMI management plan is appropriate.  Gabbriella was seen today for wound check.  Diagnoses and all orders for this visit:  Encounter for Medicare annual wellness exam- age appropriate screenings reviewed  Well controlled diabetes mellitus ([place]) -     Comprehensive metabolic panel -     Hemoglobin A1c  Essential hypertension- stable, cpm  Encounter for medication monitoring- meds refilled, labs checked  Dyslipidemia- will check levels -     Lipid panel -     Comprehensive metabolic panel  Injury of right lower extremity, subsequent encounter- advised silvadene and keflex  Other orders -     Cancel: POCT glycosylated hemoglobin (Hb A1C) -     cephALEXin (KEFLEX) 500 MG capsule; Take 1 capsule (500 mg total) by mouth 4 (four) times daily. -     silver sulfADIAZINE (SILVADENE) 1 % cream; Apply 1 application topically daily. -     zolpidem (AMBIEN CR) 6.25 MG CR tablet; Take 1 tablet (6.25 mg total) by mouth at bedtime as needed for sleep. -     clopidogrel (PLAVIX) 75 MG tablet; Take 1 tablet (75 mg total) by mouth daily. -     amLODipine (NORVASC) 5 MG tablet; Take 1 tablet (5 mg total) by mouth daily.      No Follow-up on file.  Future Appointments Date Time Provider [place]  10/02/2017 10:00 AM Rutherford Guys, MD PCP-PCP [facility]  11/22/2017 3:00 PM Wardell Honour, MD PCP-PCP [facility]  11/24/2017 9:45 AM Norma Fredrickson, MD [facility] None    There are no Patient Instructions on file for this visit.  An after visit summary with all of  these plans was given to the patient.

## 2017-09-26 ENCOUNTER — Encounter: Payer: Medicare Other | Admitting: Family Medicine

## 2017-09-26 LAB — LIPID PANEL
Chol/HDL Ratio: 4.3 ratio (ref 0.0–4.4)
Cholesterol, Total: 186 mg/dL (ref 100–199)
HDL: 43 mg/dL (ref >39)
LDL Calculated: 118 mg/dL — ABNORMAL HIGH (ref 0–99)
Triglycerides: 124 mg/dL (ref 0–149)
VLDL Cholesterol Cal: 25 mg/dL (ref 5–40)

## 2017-09-26 LAB — COMPREHENSIVE METABOLIC PANEL
ALT: 12 IU/L (ref 0–32)
AST: 14 IU/L (ref 0–40)
Albumin/Globulin Ratio: 2 (ref 1.2–2.2)
Albumin: 4.4 g/dL (ref 3.5–4.8)
Alkaline Phosphatase: 97 IU/L (ref 39–117)
BUN/Creatinine Ratio: 8 — ABNORMAL LOW (ref 12–28)
BUN: 13 mg/dL (ref 8–27)
Bilirubin Total: 0.3 mg/dL (ref 0.0–1.2)
CO2: 25 mmol/L (ref 20–29)
Calcium: 9.5 mg/dL (ref 8.7–10.3)
Chloride: 99 mmol/L (ref 96–106)
Creatinine, Ser: 1.62 mg/dL — ABNORMAL HIGH (ref 0.57–1.00)
GFR calc Af Amer: 35 mL/min/{1.73_m2} — ABNORMAL LOW (ref >59)
GFR calc non Af Amer: 30 mL/min/{1.73_m2} — ABNORMAL LOW (ref >59)
Globulin, Total: 2.2 g/dL (ref 1.5–4.5)
Glucose: 110 mg/dL — ABNORMAL HIGH (ref 65–99)
Potassium: 4.6 mmol/L (ref 3.5–5.2)
Sodium: 139 mmol/L (ref 134–144)
Total Protein: 6.6 g/dL (ref 6.0–8.5)

## 2017-09-26 LAB — HEMOGLOBIN A1C
Est. average glucose Bld gHb Est-mCnc: 131 mg/dL
Hgb A1c MFr Bld: 6.2 % — ABNORMAL HIGH (ref 4.8–5.6)

## 2017-09-27 ENCOUNTER — Encounter: Payer: Self-pay | Admitting: Physician Assistant

## 2017-09-27 ENCOUNTER — Ambulatory Visit (INDEPENDENT_AMBULATORY_CARE_PROVIDER_SITE_OTHER): Payer: Medicare Other | Admitting: Physician Assistant

## 2017-09-27 VITALS — BP 148/56 | HR 88 | Temp 98.9°F | Resp 16 | Ht <= 58 in | Wt 132.4 lb

## 2017-09-27 DIAGNOSIS — S8991XD Unspecified injury of right lower leg, subsequent encounter: Secondary | ICD-10-CM | POA: Diagnosis not present

## 2017-09-27 MED ORDER — TRAMADOL HCL 50 MG PO TABS: 50.0000 mg | ORAL_TABLET | Freq: Three times a day (TID) | ORAL | 0 refills | Status: DC | PRN

## 2017-09-27 NOTE — Progress Notes (Signed)
PRIMARY CARE AT [facility] [address] [place] [address] [phone]  Date:  09/27/2017   Name:  Gabrielle [name]   DOB:  January 28, 1935   MRN:  [id number]  PCP:  Patient, No Pcp Per    History of Present Illness:  Gabrielle [name] is a 82 y.o. female patient who presents to PCP with  Chief Complaint  Patient presents with  . Follow-up    ankle swelling and feels like it isn't any better, foot feels a little numb      Patient was seen here 2 weeks ago for right leg pain following fall.  Treated for infected hematoma, and returns for recheck.  She is compliant with taking her antibiotic.  Covering with silver sulfazidiane.  No fever, malaise, or fever.  Feels like her foot is bruised and feels slight numb.    Patient Active Problem List   Diagnosis Date Noted  . Need for prophylactic vaccination and inoculation against influenza 09/20/2017  . Right leg pain 09/20/2017  . Contusion of right leg, initial encounter 09/20/2017  . Traumatic hematoma of right lower leg 09/20/2017  . Abrasion 09/20/2017  . Iron deficiency anemia 07/08/2016  . Chronic insomnia 12/10/2015  . Spinal stenosis of lumbar region 05/21/2014  . Carotid artery disease ([place]) 02/26/2014  . PAD (peripheral artery disease) ([place])   . Renal cell cancer ([place])   . Diabetes mellitus   . Essential hypertension, benign   . Hyperlipidemia   . History of anemia   . CVA (cerebral vascular accident) [facility])     Past Medical History:  Diagnosis Date  . Arthritis   . Azotemia   . Carotid artery disease ([place])   . Claudication ([place])   . CVA (cerebral vascular accident) ([place])    [place] DIZZINESS 2008  . Diabetes mellitus    1990  . Essential hypertension, benign   . History of anemia   . History of gout   . Hyperlipidemia   . Neck pain   . PAD (peripheral artery disease) (HCC)    hx. LCEA, hx Lt renal art. stenting, occl rt renal artery, occluded bil SFAs, moderatie iliac disease,    . Renal cell cancer ([place]) 2001  .  Seizure [facility])     Past Surgical History:  Procedure Laterality Date  . CAROTID ENDARTERECTOMY     LEFT  . NEPHRECTOMY     RIGHT  . NM MYOCAR PERF WALL MOTION  07/12/2011   normal  . PV angiogram  2004   Lt renal artery stent  . URETERAL STENT PLACEMENT     LEFT    Social History  Substance Use Topics  . Smoking status: Former Smoker    Quit date: 12/26/2005  . Smokeless tobacco: Never Used  . Alcohol use No    Family History  Problem Relation Age of Onset  . Cancer Mother   . Heart disease Father     Allergies  Allergen Reactions  . Codeine Nausea And Vomiting  . Coreg [Carvedilol] Nausea And Vomiting  . Fentanyl   . Lacosamide      Other name is, VIMPAT  . Levetiracetam Other (See Comments)    Strange feelings in head  . Metformin And Related   . Metoprolol   . Morphine And Related Itching  . Oxybutynin Itching  . Sertraline Nausea Only and Other (See Comments)    Swelling in mouth  . Tessalon [Benzonatate]   . Other Rash    BETA BLOCKER  Medication list has been reviewed and updated.  Current Outpatient Prescriptions on File Prior to Visit  Medication Sig Dispense Refill  . amLODipine (NORVASC) 5 MG tablet Take 1 tablet (5 mg total) by mouth daily. 90 tablet 3  . aspirin 81 MG tablet Take 81 mg by mouth daily.    . cephALEXin (KEFLEX) 500 MG capsule Take 1 capsule (500 mg total) by mouth 4 (four) times daily. 28 capsule 0  . cetirizine (ZYRTEC) 5 MG tablet Take 1 tablet (5 mg total) by mouth daily. 30 tablet 1  . clopidogrel (PLAVIX) 75 MG tablet Take 1 tablet (75 mg total) by mouth daily. 90 tablet 2  . HYDROcodone-acetaminophen (NORCO) 5-325 MG tablet Take 1 tablet by mouth every 6 (six) hours as needed for moderate pain. 15 tablet 0  . Pumpkin Seed-Soy Germ (AZO BLADDER CONTROL/GO-LESS PO) Take 1 tablet by mouth daily.     . silver sulfADIAZINE (SILVADENE) 1 % cream Apply 1 application topically daily. 50 g 0  . zolpidem (AMBIEN CR) 6.25 MG CR tablet  Take 1 tablet (6.25 mg total) by mouth at bedtime as needed for sleep. 14 tablet 0   No current facility-administered medications on file prior to visit.     ROS ROS otherwise unremarkable unless listed above.  Physical Examination: BP (!) 148/56   Pulse 88   Temp 98.9 F (37.2 C)   Resp 16   Ht 4\' 10"  (1.473 m)   Wt 132 lb 6.4 oz (60.1 kg)   SpO2 98%   BMI 27.67 kg/m  Ideal Body Weight: Weight in (lb) to have BMI = 25: 119.4  Physical Exam  Constitutional: She is oriented to person, place, and time. She appears well-developed and well-nourished. No distress.  HENT:  Head: Normocephalic and atraumatic.  Right Ear: External ear normal.  Left Ear: External ear normal.  Eyes: Pupils are equal, round, and reactive to light. Conjunctivae and EOM are normal.  Cardiovascular: Normal rate.   Pulmonary/Chest: Effort normal. No respiratory distress.  Musculoskeletal:  Right anterior medail lower leg with swollen nodule with slight erythema.  This is non-warm to the touch.  Tender along this swelling, but more anterior. There is ecchmosis at the base of foot without tenderness.  Sensation intact.    Neurological: She is alert and oriented to person, place, and time.  Skin: She is not diaphoretic.  Psychiatric: She has a normal mood and affect. Her behavior is normal.     Assessment and Plan: Gabrielle [name] is a 82 y.o. female who is here today for cc of  Chief Complaint  Patient presents with  . Follow-up    ankle swelling and feels like it isn't any better, foot feels a little numb    Advised to continue to abx, and compression.  Compression dressing placed and washed and re wrapped.. rtc in 5 days Alarming sxs discussed to warrant an immediate return. If no improvement, may need to consider drainage.   Injury of right lower extremity, subsequent encounter  Ivar Drape, PA-C [facility] 10/7/201811:02 AM

## 2017-09-27 NOTE — Patient Instructions (Addendum)
Please return in 5 days.  Continue to place the silver sulfadiazine and take the antibiotics.  Please return sooner if you are having any fever, nausea, increased swelling, pain, or red streaking up the leg.   We will try the tramadol, but if this makes the allergic symptoms, stop immediately.

## 2017-10-02 ENCOUNTER — Ambulatory Visit (INDEPENDENT_AMBULATORY_CARE_PROVIDER_SITE_OTHER): Payer: Medicare Other | Admitting: Family Medicine

## 2017-10-02 ENCOUNTER — Encounter: Payer: Self-pay | Admitting: Family Medicine

## 2017-10-02 VITALS — BP 132/70 | HR 105 | Temp 98.4°F | Resp 18 | Ht <= 58 in | Wt 128.8 lb

## 2017-10-02 DIAGNOSIS — S8991XD Unspecified injury of right lower leg, subsequent encounter: Secondary | ICD-10-CM | POA: Diagnosis not present

## 2017-10-02 DIAGNOSIS — T148XXA Other injury of unspecified body region, initial encounter: Secondary | ICD-10-CM

## 2017-10-02 NOTE — Patient Instructions (Addendum)
1. Warm compresses to hematoma for 15 mins, 4-5 times a day 2. Continue to wear compression dressing 3. Continue to elevate leg 4. Stop ibuprofen, use tylenol as needed for pain    IF you received an x-ray today, you will receive an invoice from [facility]. Please contact [facility] at [phone] with questions or concerns regarding your invoice.   IF you received labwork today, you will receive an invoice from [facility]. Please contact [facility] at [phone] with questions or concerns regarding your invoice.   Our billing staff will not be able to assist you with questions regarding bills from these companies.  You will be contacted with the lab results as soon as they are available. The fastest way to get your results is to activate your My Chart account. Instructions are located on the last page of this paperwork. If you have not heard from [place] regarding the results in 2 weeks, please contact this office.

## 2017-10-02 NOTE — Progress Notes (Signed)
10/8/201811:06 AM  Gabrielle [name] [birth year], 82 y.o. female [id number]  Chief Complaint  Patient presents with  . Leg Injury    follow up right leg     HPI:   Patient is a 82 y.o. female who presents today for fu on injury to right lower leg 9 days ago. She has a hematoma that became infected, today she takes last abx medication. She has been wearing ace compression bandage and elevating leg daily. She reports it is not changing in size. She has not been using warm compresses. Has been using ibu 400mg  TID for pain. She states it is still very painful.   Depression screen [facility] 2/9 09/27/2017 09/25/2017 09/20/2017  Decreased Interest 0 0 0  Down, Depressed, Hopeless 0 0 0  PHQ - 2 Score 0 0 0    Allergies  Allergen Reactions  . Codeine Nausea And Vomiting  . Coreg [Carvedilol] Nausea And Vomiting  . Fentanyl   . Lacosamide      Other name is, VIMPAT  . Levetiracetam Other (See Comments)    Strange feelings in head  . Metformin And Related   . Metoprolol   . Morphine And Related Itching  . Oxybutynin Itching  . Sertraline Nausea Only and Other (See Comments)    Swelling in mouth  . Tessalon [Benzonatate]   . Other Rash    BETA BLOCKER    Prior to Admission medications   Medication Sig Start Date End Date Taking? Authorizing Provider  amLODipine (NORVASC) 5 MG tablet Take 1 tablet (5 mg total) by mouth daily. 09/25/17  Yes Delia Chimes A, MD  aspirin 81 MG tablet Take 81 mg by mouth daily.   Yes [provider]  cephALEXin (KEFLEX) 500 MG capsule Take 1 capsule (500 mg total) by mouth 4 (four) times daily. 09/25/17 10/02/17 Yes Stallings, Zoe A, MD  cetirizine (ZYRTEC) 5 MG tablet Take 1 tablet (5 mg total) by mouth daily. 06/10/17  Yes Tenna Delaine D, PA-C  clopidogrel (PLAVIX) 75 MG tablet Take 1 tablet (75 mg total) by mouth daily. 09/25/17  Yes Forrest Moron, MD  HYDROcodone-acetaminophen (NORCO) 5-325 MG tablet Take 1 tablet by mouth every 6 (six)  hours as needed for moderate pain. 09/20/17  Yes Sagardia, Ines Bloomer, MD  Pumpkin Seed-Soy Germ (AZO BLADDER CONTROL/GO-LESS PO) Take 1 tablet by mouth daily.    Yes [provider]  silver sulfADIAZINE (SILVADENE) 1 % cream Apply 1 application topically daily. 09/25/17  Yes Stallings, Zoe A, MD  traMADol (ULTRAM) 50 MG tablet Take 1 tablet (50 mg total) by mouth every 8 (eight) hours as needed. 09/27/17  Yes English, Colletta Maryland D, PA  zolpidem (AMBIEN CR) 6.25 MG CR tablet Take 1 tablet (6.25 mg total) by mouth at bedtime as needed for sleep. 09/25/17  Yes Forrest Moron, MD    Past Medical History:  Diagnosis Date  . Arthritis   . Azotemia   . Carotid artery disease ([place])   . Claudication ([place])   . CVA (cerebral vascular accident) ([place])    [place] DIZZINESS 2008  . Diabetes mellitus    1990  . Essential hypertension, benign   . History of anemia   . History of gout   . Hyperlipidemia   . Neck pain   . PAD (peripheral artery disease) (HCC)    hx. LCEA, hx Lt renal art. stenting, occl rt renal artery, occluded bil SFAs, moderatie iliac disease,    . Renal cell cancer (  [place]) 2001  . Seizure [facility])     Past Surgical History:  Procedure Laterality Date  . CAROTID ENDARTERECTOMY     LEFT  . NEPHRECTOMY     RIGHT  . NM MYOCAR PERF WALL MOTION  07/12/2011   normal  . PV angiogram  2004   Lt renal artery stent  . URETERAL STENT PLACEMENT     LEFT    Social History  Substance Use Topics  . Smoking status: Former Smoker    Quit date: 12/26/2005  . Smokeless tobacco: Never Used  . Alcohol use No    Family History  Problem Relation Age of Onset  . Cancer Mother   . Heart disease Father     Review of Systems  Constitutional: Negative for chills and fever.  Gastrointestinal: Negative for nausea and vomiting.  Skin: no drainaging   OBJECTIVE:  Blood pressure 132/70, pulse (!) 105, temperature 98.4 F (36.9 C), temperature source Oral, resp. rate 18, height 4\' 10"   (1.473 m), weight 128 lb 12.8 oz (58.4 kg), SpO2 95 %.  Physical Exam  Gen: AAOx3, NAD Skin: RLE ant shin with moderate sized hematoma, no warmth or erythema, no drainage. TTP mostly along dependant areas.    ASSESSMENT and PLAN  1. Injury of right lower extremity, subsequent encounter  2. Hematoma and contusion  Overall better as infection resolved. Discussed adding warm compresses to conservative measures already in place. Discussed stopping ibu and using tylenol for pain. Due to size, might need to be drained.   Return in about 3 days (around 10/05/2017).    Rutherford Guys, MD Primary Care at [place], [place] [address] Ph.  [phone] Fax [phone]

## 2017-10-05 ENCOUNTER — Ambulatory Visit (INDEPENDENT_AMBULATORY_CARE_PROVIDER_SITE_OTHER): Payer: Medicare Other | Admitting: Family Medicine

## 2017-10-05 ENCOUNTER — Encounter: Payer: Self-pay | Admitting: Family Medicine

## 2017-10-05 VITALS — BP 132/64 | HR 94 | Temp 98.9°F | Resp 18 | Ht <= 58 in | Wt 130.2 lb

## 2017-10-05 DIAGNOSIS — M79604 Pain in right leg: Secondary | ICD-10-CM

## 2017-10-05 DIAGNOSIS — T148XXA Other injury of unspecified body region, initial encounter: Secondary | ICD-10-CM | POA: Diagnosis not present

## 2017-10-05 NOTE — Patient Instructions (Signed)
     IF you received an x-ray today, you will receive an invoice from [facility]. Please contact [facility] at [phone] with questions or concerns regarding your invoice.   IF you received labwork today, you will receive an invoice from [facility]. Please contact [facility] at [phone] with questions or concerns regarding your invoice.   Our billing staff will not be able to assist you with questions regarding bills from these companies.  You will be contacted with the lab results as soon as they are available. The fastest way to get your results is to activate your My Chart account. Instructions are located on the last page of this paperwork. If you have not heard from us regarding the results in 2 weeks, please contact this office.     

## 2017-10-05 NOTE — Progress Notes (Signed)
10/11/201812:13 PM  Gabrielle [name] [birth year], 82 y.o. female [id number]  Chief Complaint  Patient presents with  . Wound Check    right leg     HPI:   Patient is a 82 y.o. female who presents today for fu on right lower leg hematoma after she tripped and landed on cement floor of her garage. First seen on 09/20/17, treated at that time with abx for concern of infection. Since then infection resolved but hematoma and pain have persisted, despite constant elevation, compression dressing and use of warm compresses. She is anxious to be able to get back to her husband's bedside, he is in a nursing home.   Depression screen [facility] 2/9 09/27/2017 09/25/2017 09/20/2017  Decreased Interest 0 0 0  Down, Depressed, Hopeless 0 0 0  PHQ - 2 Score 0 0 0    Allergies  Allergen Reactions  . Codeine Nausea And Vomiting  . Coreg [Carvedilol] Nausea And Vomiting  . Fentanyl   . Lacosamide      Other name is, VIMPAT  . Levetiracetam Other (See Comments)    Strange feelings in head  . Metformin And Related   . Metoprolol   . Morphine And Related Itching  . Oxybutynin Itching  . Sertraline Nausea Only and Other (See Comments)    Swelling in mouth  . Tessalon [Benzonatate]   . Other Rash    BETA BLOCKER    Prior to Admission medications   Medication Sig Start Date End Date Taking? Authorizing Provider  amLODipine (NORVASC) 5 MG tablet Take 1 tablet (5 mg total) by mouth daily. 09/25/17  Yes Delia Chimes A, MD  aspirin 81 MG tablet Take 81 mg by mouth daily.   Yes [provider]  cetirizine (ZYRTEC) 5 MG tablet Take 1 tablet (5 mg total) by mouth daily. 06/10/17  Yes Tenna Delaine D, PA-C  clopidogrel (PLAVIX) 75 MG tablet Take 1 tablet (75 mg total) by mouth daily. 09/25/17  Yes Forrest Moron, MD  HYDROcodone-acetaminophen (NORCO) 5-325 MG tablet Take 1 tablet by mouth every 6 (six) hours as needed for moderate pain. 09/20/17  Yes Sagardia, Ines Bloomer, MD  Pumpkin Seed-Soy  Germ (AZO BLADDER CONTROL/GO-LESS PO) Take 1 tablet by mouth daily.    Yes [provider]  silver sulfADIAZINE (SILVADENE) 1 % cream Apply 1 application topically daily. 09/25/17  Yes Stallings, Zoe A, MD  traMADol (ULTRAM) 50 MG tablet Take 1 tablet (50 mg total) by mouth every 8 (eight) hours as needed. 09/27/17  Yes English, Colletta Maryland D, PA  zolpidem (AMBIEN CR) 6.25 MG CR tablet Take 1 tablet (6.25 mg total) by mouth at bedtime as needed for sleep. 09/25/17  Yes Forrest Moron, MD    Past Medical History:  Diagnosis Date  . Arthritis   . Azotemia   . Carotid artery disease ([place])   . Claudication ([place])   . CVA (cerebral vascular accident) ([place])    [place] DIZZINESS 2008  . Diabetes mellitus    1990  . Essential hypertension, benign   . History of anemia   . History of gout   . Hyperlipidemia   . Neck pain   . PAD (peripheral artery disease) ([facility])    hx. LCEA, hx Lt renal art. stenting, occl rt renal artery, occluded bil SFAs, moderatie iliac disease,    . Renal cell cancer ([place]) 2001  . Seizure [facility])     Past Surgical History:  Procedure Laterality Date  . CAROTID ENDARTERECTOMY  LEFT  . NEPHRECTOMY     RIGHT  . NM MYOCAR PERF WALL MOTION  07/12/2011   normal  . PV angiogram  2004   Lt renal artery stent  . URETERAL STENT PLACEMENT     LEFT    Social History  Substance Use Topics  . Smoking status: Former Smoker    Quit date: 12/26/2005  . Smokeless tobacco: Never Used  . Alcohol use No    Family History  Problem Relation Age of Onset  . Cancer Mother   . Heart disease Father     Review of Systems  Constitutional: Negative for chills and fever.     OBJECTIVE:  Blood pressure 132/64, pulse 94, temperature 98.9 F (37.2 C), temperature source Oral, resp. rate 18, height 4\' 10"  (1.473 m), weight 130 lb 3.2 oz (59.1 kg), SpO2 98 %.  Physical Exam Gen: AAOx3, NAD Skin: RLE ant shin with moderate sized hematoma, no warmth or erythema, no drainage.  TTP mostly along dependant areas.   Discussed attempting aspiration, discussed r/se/b, verbal consent obtained. Area was cleansed with alcohol wipe and topical lidocaine applied. An 18g needle was used to attempt aspiration but no return obtained. Bacitracin and dressing applied. Routine care instructions and precautions given  ASSESSMENT and PLAN 1. Right leg pain 2. Hematoma and contusion - Ambulatory referral to General Surgery  Return if symptoms worsen or fail to improve.    Rutherford Guys, MD Primary Care at [place], [place] [address] Ph.  [phone] Fax [phone]

## 2017-10-07 ENCOUNTER — Other Ambulatory Visit: Payer: Self-pay | Admitting: Family Medicine

## 2017-10-13 ENCOUNTER — Inpatient Hospital Stay (HOSPITAL_COMMUNITY)
Admission: EM | Admit: 2017-10-13 | Discharge: 2017-10-15 | DRG: 312 | Disposition: A | Discharge status: Home / Self Care | Payer: Medicare Other | Attending: Family Medicine | Admitting: Family Medicine

## 2017-10-13 ENCOUNTER — Encounter (HOSPITAL_COMMUNITY): Payer: Self-pay | Admitting: Emergency Medicine

## 2017-10-13 DIAGNOSIS — Z7982 Long term (current) use of aspirin: Secondary | ICD-10-CM

## 2017-10-13 DIAGNOSIS — I779 Disorder of arteries and arterioles, unspecified: Secondary | ICD-10-CM | POA: Diagnosis present

## 2017-10-13 DIAGNOSIS — I739 Peripheral vascular disease, unspecified: Secondary | ICD-10-CM

## 2017-10-13 DIAGNOSIS — G8929 Other chronic pain: Secondary | ICD-10-CM | POA: Diagnosis present

## 2017-10-13 DIAGNOSIS — S8011XA Contusion of right lower leg, initial encounter: Secondary | ICD-10-CM | POA: Diagnosis present

## 2017-10-13 DIAGNOSIS — Z66 Do not resuscitate: Secondary | ICD-10-CM | POA: Diagnosis not present

## 2017-10-13 DIAGNOSIS — Z634 Disappearance and death of family member: Secondary | ICD-10-CM

## 2017-10-13 DIAGNOSIS — E1122 Type 2 diabetes mellitus with diabetic chronic kidney disease: Secondary | ICD-10-CM | POA: Diagnosis not present

## 2017-10-13 DIAGNOSIS — I2510 Atherosclerotic heart disease of native coronary artery without angina pectoris: Secondary | ICD-10-CM | POA: Diagnosis present

## 2017-10-13 DIAGNOSIS — I6523 Occlusion and stenosis of bilateral carotid arteries: Secondary | ICD-10-CM | POA: Diagnosis not present

## 2017-10-13 DIAGNOSIS — I951 Orthostatic hypotension: Principal | ICD-10-CM | POA: Diagnosis present

## 2017-10-13 DIAGNOSIS — W1830XA Fall on same level, unspecified, initial encounter: Secondary | ICD-10-CM | POA: Diagnosis present

## 2017-10-13 DIAGNOSIS — I639 Cerebral infarction, unspecified: Secondary | ICD-10-CM | POA: Diagnosis present

## 2017-10-13 DIAGNOSIS — Z79899 Other long term (current) drug therapy: Secondary | ICD-10-CM

## 2017-10-13 DIAGNOSIS — E1151 Type 2 diabetes mellitus with diabetic peripheral angiopathy without gangrene: Secondary | ICD-10-CM | POA: Diagnosis present

## 2017-10-13 DIAGNOSIS — Z8673 Personal history of transient ischemic attack (TIA), and cerebral infarction without residual deficits: Secondary | ICD-10-CM

## 2017-10-13 DIAGNOSIS — D649 Anemia, unspecified: Secondary | ICD-10-CM | POA: Diagnosis present

## 2017-10-13 DIAGNOSIS — R55 Syncope and collapse: Secondary | ICD-10-CM | POA: Diagnosis present

## 2017-10-13 DIAGNOSIS — Z905 Acquired absence of kidney: Secondary | ICD-10-CM

## 2017-10-13 DIAGNOSIS — E860 Dehydration: Secondary | ICD-10-CM | POA: Diagnosis present

## 2017-10-13 DIAGNOSIS — I129 Hypertensive chronic kidney disease with stage 1 through stage 4 chronic kidney disease, or unspecified chronic kidney disease: Secondary | ICD-10-CM | POA: Diagnosis present

## 2017-10-13 DIAGNOSIS — Z87891 Personal history of nicotine dependence: Secondary | ICD-10-CM

## 2017-10-13 DIAGNOSIS — N183 Chronic kidney disease, stage 3 (moderate): Secondary | ICD-10-CM | POA: Diagnosis not present

## 2017-10-13 DIAGNOSIS — Z8249 Family history of ischemic heart disease and other diseases of the circulatory system: Secondary | ICD-10-CM

## 2017-10-13 DIAGNOSIS — M5440 Lumbago with sciatica, unspecified side: Secondary | ICD-10-CM | POA: Diagnosis present

## 2017-10-13 DIAGNOSIS — Z7902 Long term (current) use of antithrombotics/antiplatelets: Secondary | ICD-10-CM

## 2017-10-13 DIAGNOSIS — Z85528 Personal history of other malignant neoplasm of kidney: Secondary | ICD-10-CM

## 2017-10-13 DIAGNOSIS — R404 Transient alteration of awareness: Secondary | ICD-10-CM | POA: Diagnosis not present

## 2017-10-13 DIAGNOSIS — Z862 Personal history of diseases of the blood and blood-forming organs and certain disorders involving the immune mechanism: Secondary | ICD-10-CM

## 2017-10-13 DIAGNOSIS — I119 Hypertensive heart disease without heart failure: Secondary | ICD-10-CM | POA: Diagnosis present

## 2017-10-13 DIAGNOSIS — E785 Hyperlipidemia, unspecified: Secondary | ICD-10-CM | POA: Diagnosis present

## 2017-10-13 LAB — BASIC METABOLIC PANEL
Anion gap: 7 (ref 5–15)
BUN: 24 mg/dL — ABNORMAL HIGH (ref 6–20)
CO2: 25 mmol/L (ref 22–32)
Calcium: 9.1 mg/dL (ref 8.9–10.3)
Chloride: 109 mmol/L (ref 101–111)
Creatinine, Ser: 1.76 mg/dL — ABNORMAL HIGH (ref 0.44–1.00)
GFR calc Af Amer: 31 mL/min — ABNORMAL LOW (ref >60)
GFR calc non Af Amer: 26 mL/min — ABNORMAL LOW (ref >60)
Glucose, Bld: 80 mg/dL (ref 65–99)
Potassium: 4.3 mmol/L (ref 3.5–5.1)
Sodium: 141 mmol/L (ref 135–145)

## 2017-10-13 LAB — CBC
HCT: 30.9 % — ABNORMAL LOW (ref 36.0–46.0)
Hemoglobin: 9.7 g/dL — ABNORMAL LOW (ref 12.0–15.0)
MCH: 24.7 pg — ABNORMAL LOW (ref 26.0–34.0)
MCHC: 31.4 g/dL (ref 30.0–36.0)
MCV: 78.6 fL (ref 78.0–100.0)
Platelets: 487 10*3/uL — ABNORMAL HIGH (ref 150–400)
RBC: 3.93 MIL/uL (ref 3.87–5.11)
RDW: 16.6 % — ABNORMAL HIGH (ref 11.5–15.5)
WBC: 8.5 10*3/uL (ref 4.0–10.5)

## 2017-10-13 LAB — CBG MONITORING, ED: Glucose-Capillary: 75 mg/dL (ref 65–99)

## 2017-10-13 NOTE — ED Provider Notes (Signed)
Gabrielle [name]   CSN: [id number] Arrival date & time: 10/13/17  1416    History   Chief Complaint Chief Complaint  Patient presents with  . Loss of Consciousness  . leg infection    HPI Gabrielle [name] is a 82 y.o. female.  82 year old female with a history of diabetes mellitus, hypertension, dyslipidemia, CVA, PAD, and CAD presents to the emergency department after a syncopal event. Patient states that she was checking out of a store where she was buying close for her husband's-year-old which is tomorrow. She recalls waking in a wheelchair. Daughter states that she was assisted from the floor to a wheelchair. Daughter was not there at the time of the syncopal event because she was retrieving some items from the car. She states that the patient regained consciousness a few seconds after her return. She noticed generalized shaking, the patient had no incontinence. Patient denies any known chest pain or shortness of breath prior to her syncope. She did not have any prodrome of lightheadedness. No subsequent nausea or vomiting. Patient endorses taking her medications this morning. No recent fevers. Patient states that she had not eaten this morning, prior to LOC.   The history is provided by the patient. No language interpreter was used.  Loss of Consciousness      Past Medical History:  Diagnosis Date  . Arthritis   . Azotemia   . Carotid artery disease ([place])   . Claudication ([place])   . CVA (cerebral vascular accident) ([place])    [place] DIZZINESS 2008  . Diabetes mellitus    1990  . Essential hypertension, benign   . History of anemia   . History of gout   . Hyperlipidemia   . Neck pain   . PAD (peripheral artery disease) ([facility])    hx. LCEA, hx Lt renal art. stenting, occl rt renal artery, occluded bil SFAs, moderatie iliac disease,    . Renal cell cancer ([place]) 2001  . Seizure [facility])     Patient Active Problem List   Diagnosis  Date Noted  . Need for prophylactic vaccination and inoculation against influenza 09/20/2017  . Right leg pain 09/20/2017  . Contusion of right leg, initial encounter 09/20/2017  . Traumatic hematoma of right lower leg 09/20/2017  . Abrasion 09/20/2017  . Iron deficiency anemia 07/08/2016  . Chronic insomnia 12/10/2015  . Spinal stenosis of lumbar region 05/21/2014  . Carotid artery disease ([place]) 02/26/2014  . PAD (peripheral artery disease) ([name])   . Renal cell cancer ([place])   . Diabetes mellitus   . Essential hypertension, benign   . Hyperlipidemia   . History of anemia   . CVA (cerebral vascular accident) [facility])     Past Surgical History:  Procedure Laterality Date  . CAROTID ENDARTERECTOMY     LEFT  . NEPHRECTOMY     RIGHT  . NM MYOCAR PERF WALL MOTION  07/12/2011   normal  . PV angiogram  2004   Lt renal artery stent  . URETERAL STENT PLACEMENT     LEFT    OB History    No data available       Home Medications    Prior to Admission medications   Medication Sig Start Date End Date Taking? Authorizing Provider  amLODipine (NORVASC) 5 MG tablet Take 1 tablet (5 mg total) by mouth daily. 09/25/17  Yes Delia Chimes A, MD  aspirin 81 MG tablet Take 81 mg by mouth daily.  Yes [provider]  cetirizine (ZYRTEC) 5 MG tablet Take 1 tablet (5 mg total) by mouth daily. 06/10/17  Yes Tenna Delaine D, PA-C  clopidogrel (PLAVIX) 75 MG tablet Take 1 tablet (75 mg total) by mouth daily. 09/25/17  Yes Forrest Moron, MD  HYDROcodone-acetaminophen (NORCO) 5-325 MG tablet Take 1 tablet by mouth every 6 (six) hours as needed for moderate pain. 09/20/17  Yes Sagardia, Ines Bloomer, MD  traMADol (ULTRAM) 50 MG tablet Take 1 tablet (50 mg total) by mouth every 8 (eight) hours as needed. 09/27/17  Yes English, Colletta Maryland D, PA  zolpidem (AMBIEN CR) 6.25 MG CR tablet Take 1 tablet (6.25 mg total) by mouth at bedtime as needed for sleep. 09/25/17  Yes Stallings, Zoe A, MD  silver  sulfADIAZINE (SILVADENE) 1 % cream Apply 1 application topically daily. Patient not taking: Reported on 10/13/2017 09/25/17   Forrest Moron, MD    Family History Family History  Problem Relation Age of Onset  . Cancer Mother   . Heart disease Father     Social History Social History  Substance Use Topics  . Smoking status: Former Smoker    Quit date: 12/26/2005  . Smokeless tobacco: Never Used  . Alcohol use No     Allergies   Codeine; Coreg [carvedilol]; Fentanyl; Lacosamide; Levetiracetam; Metformin and related; Metoprolol; Morphine and related; Oxybutynin; Sertraline; Tessalon [benzonatate]; and Other   Review of Systems Review of Systems  Cardiovascular: Positive for syncope.   Ten systems reviewed and are negative for acute change, except as noted in the HPI.    Physical Exam Updated Vital Signs BP (!) 141/53 (BP Location: Right Arm)   Pulse 75   Temp 98.4 F (36.9 C) (Oral)   Resp 10   SpO2 100%   Physical Exam  Constitutional: She is oriented to person, place, and time. She appears well-developed and well-nourished. No distress.  Patient in NAD  HENT:  Head: Normocephalic and atraumatic.  Eyes: Conjunctivae and EOM are normal. No scleral icterus.  Neck: Normal range of motion.  Cardiovascular: Normal rate, regular rhythm and intact distal pulses.   NSR. DP pulse 2+ in the RLE.  Pulmonary/Chest: Effort normal. No respiratory distress. She has no wheezes. She has no rales.  Lungs CTAB  Musculoskeletal: Normal range of motion.  Swelling to the medial RLE, inferior to the knee with associated erythema which is blanching. Area is fluctuant. Suspicious for Morel-Lavallee given hx of trauma. No red linear streaking or heat to touch.  Neurological: She is alert and oriented to person, place, and time. She exhibits normal muscle tone. Coordination normal.  GCS 15. Patient moving all extremities.  Skin: Skin is warm and dry. No rash noted. She is not diaphoretic.  No erythema. No pallor.  Psychiatric: Her speech is normal. She exhibits a depressed mood.  Nursing [name] and vitals reviewed.    ED Treatments / Results  Labs (all labs ordered are listed, but only abnormal results are displayed) Labs Reviewed  BASIC METABOLIC PANEL - Abnormal; Notable for the following:       Result Value   BUN 24 (*)    Creatinine, Ser 1.76 (*)    GFR calc non Af Amer 26 (*)    GFR calc Af Amer 31 (*)    All other components within normal limits  CBC - Abnormal; Notable for the following:    Hemoglobin 9.7 (*)    HCT 30.9 (*)    MCH 24.7 (*)  RDW 16.6 (*)    Platelets 487 (*)    All other components within normal limits  TROPONIN I  URINALYSIS, ROUTINE W REFLEX MICROSCOPIC  CBG MONITORING, ED    EKG  EKG Interpretation  Date/Time:  Friday October 13 2017 15:15:09 EDT Ventricular Rate:  76 PR Interval:    QRS Duration: 79 QT Interval:  375 QTC Calculation: 422 R Axis:   42 Text Interpretation:  Sinus rhythm Consider left ventricular hypertrophy No significant change since last tracing Confirmed by Addison Lank [phone]) on 10/13/2017 7:12:35 PM       Radiology No results found.  Procedures Procedures (including critical care time)  Medications Ordered in ED Medications - No data to display   Initial Impression / Assessment and Plan / ED Course  I have reviewed the triage vital signs and the nursing notes.  Pertinent labs & imaging results that were available during my care of the patient were reviewed by me and considered in my medical decision making (see chart for details).     82 year old female presents to the emergency department after syncopal event. No prodrome to the patient's syncope. She specifically denies any chest pain, shortness of breath, lightheadedness or higher to her syncopal event. Daughter states that she is currently at baseline. Work up has been reassuring in the ED, though history less favorable for vasovagal  cause. Will admit for syncope work up. Case discussed with Dr. Blaine Hamper of [facility] who will admit.   Final Clinical Impressions(s) / ED Diagnoses   Final diagnoses:  Syncope and collapse    New Prescriptions New Prescriptions   No medications on file     Antonietta Breach, Hershal Coria 10/14/17 [place], [place], MD 10/15/17 [phone]

## 2017-10-13 NOTE — ED Notes (Signed)
Pt unable to provide urine specimen at this time

## 2017-10-13 NOTE — ED Triage Notes (Signed)
Per [facility] pt was checking out at [employer] and had syncopal episode.  Pt has infection in right leg that gotten worse. Pt spouse past away 3 days ago. Pat not eaten today. 20g left arm

## 2017-10-14 ENCOUNTER — Observation Stay (HOSPITAL_COMMUNITY): Payer: Medicare Other

## 2017-10-14 DIAGNOSIS — E785 Hyperlipidemia, unspecified: Secondary | ICD-10-CM | POA: Diagnosis present

## 2017-10-14 DIAGNOSIS — Z87891 Personal history of nicotine dependence: Secondary | ICD-10-CM | POA: Diagnosis not present

## 2017-10-14 DIAGNOSIS — Z905 Acquired absence of kidney: Secondary | ICD-10-CM | POA: Diagnosis not present

## 2017-10-14 DIAGNOSIS — Z634 Disappearance and death of family member: Secondary | ICD-10-CM | POA: Diagnosis not present

## 2017-10-14 DIAGNOSIS — Z8249 Family history of ischemic heart disease and other diseases of the circulatory system: Secondary | ICD-10-CM | POA: Diagnosis not present

## 2017-10-14 DIAGNOSIS — S8011XA Contusion of right lower leg, initial encounter: Secondary | ICD-10-CM

## 2017-10-14 DIAGNOSIS — I10 Essential (primary) hypertension: Secondary | ICD-10-CM

## 2017-10-14 DIAGNOSIS — E1122 Type 2 diabetes mellitus with diabetic chronic kidney disease: Secondary | ICD-10-CM | POA: Diagnosis present

## 2017-10-14 DIAGNOSIS — I129 Hypertensive chronic kidney disease with stage 1 through stage 4 chronic kidney disease, or unspecified chronic kidney disease: Secondary | ICD-10-CM | POA: Diagnosis present

## 2017-10-14 DIAGNOSIS — N183 Chronic kidney disease, stage 3 (moderate): Secondary | ICD-10-CM | POA: Diagnosis not present

## 2017-10-14 DIAGNOSIS — I2510 Atherosclerotic heart disease of native coronary artery without angina pectoris: Secondary | ICD-10-CM | POA: Diagnosis present

## 2017-10-14 DIAGNOSIS — R55 Syncope and collapse: Secondary | ICD-10-CM | POA: Diagnosis not present

## 2017-10-14 DIAGNOSIS — M5440 Lumbago with sciatica, unspecified side: Secondary | ICD-10-CM | POA: Diagnosis present

## 2017-10-14 DIAGNOSIS — Z862 Personal history of diseases of the blood and blood-forming organs and certain disorders involving the immune mechanism: Secondary | ICD-10-CM

## 2017-10-14 DIAGNOSIS — Z7902 Long term (current) use of antithrombotics/antiplatelets: Secondary | ICD-10-CM | POA: Diagnosis not present

## 2017-10-14 DIAGNOSIS — E860 Dehydration: Secondary | ICD-10-CM | POA: Diagnosis present

## 2017-10-14 DIAGNOSIS — D649 Anemia, unspecified: Secondary | ICD-10-CM | POA: Diagnosis present

## 2017-10-14 DIAGNOSIS — I6523 Occlusion and stenosis of bilateral carotid arteries: Secondary | ICD-10-CM | POA: Diagnosis not present

## 2017-10-14 DIAGNOSIS — E1151 Type 2 diabetes mellitus with diabetic peripheral angiopathy without gangrene: Secondary | ICD-10-CM | POA: Diagnosis present

## 2017-10-14 DIAGNOSIS — Z8673 Personal history of transient ischemic attack (TIA), and cerebral infarction without residual deficits: Secondary | ICD-10-CM | POA: Diagnosis not present

## 2017-10-14 DIAGNOSIS — Z79899 Other long term (current) drug therapy: Secondary | ICD-10-CM | POA: Diagnosis not present

## 2017-10-14 DIAGNOSIS — W1830XA Fall on same level, unspecified, initial encounter: Secondary | ICD-10-CM | POA: Diagnosis present

## 2017-10-14 DIAGNOSIS — Z66 Do not resuscitate: Secondary | ICD-10-CM | POA: Diagnosis present

## 2017-10-14 DIAGNOSIS — I951 Orthostatic hypotension: Secondary | ICD-10-CM | POA: Diagnosis present

## 2017-10-14 DIAGNOSIS — Z7982 Long term (current) use of aspirin: Secondary | ICD-10-CM | POA: Diagnosis not present

## 2017-10-14 DIAGNOSIS — Z85528 Personal history of other malignant neoplasm of kidney: Secondary | ICD-10-CM | POA: Diagnosis not present

## 2017-10-14 DIAGNOSIS — G8929 Other chronic pain: Secondary | ICD-10-CM | POA: Diagnosis present

## 2017-10-14 LAB — BASIC METABOLIC PANEL
Anion gap: 10 (ref 5–15)
BUN: 24 mg/dL — ABNORMAL HIGH (ref 6–20)
CO2: 22 mmol/L (ref 22–32)
Calcium: 8.3 mg/dL — ABNORMAL LOW (ref 8.9–10.3)
Chloride: 105 mmol/L (ref 101–111)
Creatinine, Ser: 1.77 mg/dL — ABNORMAL HIGH (ref 0.44–1.00)
GFR calc Af Amer: 30 mL/min — ABNORMAL LOW (ref >60)
GFR calc non Af Amer: 26 mL/min — ABNORMAL LOW (ref >60)
Glucose, Bld: 121 mg/dL — ABNORMAL HIGH (ref 65–99)
Potassium: 3.9 mmol/L (ref 3.5–5.1)
Sodium: 137 mmol/L (ref 135–145)

## 2017-10-14 LAB — URINALYSIS, ROUTINE W REFLEX MICROSCOPIC
Bacteria, UA: NONE SEEN
Bilirubin Urine: NEGATIVE
Glucose, UA: 50 mg/dL — AB
Hgb urine dipstick: NEGATIVE
Ketones, ur: NEGATIVE mg/dL
Leukocytes, UA: NEGATIVE
Nitrite: NEGATIVE
Protein, ur: 100 mg/dL — AB
Specific Gravity, Urine: 1.019 (ref 1.005–1.030)
pH: 5 (ref 5.0–8.0)

## 2017-10-14 LAB — GLUCOSE, CAPILLARY
Glucose-Capillary: 115 mg/dL — ABNORMAL HIGH (ref 65–99)
Glucose-Capillary: 80 mg/dL (ref 65–99)

## 2017-10-14 LAB — CBC
HCT: 24.8 % — ABNORMAL LOW (ref 36.0–46.0)
Hemoglobin: 8.3 g/dL — ABNORMAL LOW (ref 12.0–15.0)
MCH: 26 pg (ref 26.0–34.0)
MCHC: 33.5 g/dL (ref 30.0–36.0)
MCV: 77.7 fL — ABNORMAL LOW (ref 78.0–100.0)
Platelets: 391 10*3/uL (ref 150–400)
RBC: 3.19 MIL/uL — ABNORMAL LOW (ref 3.87–5.11)
RDW: 16.5 % — ABNORMAL HIGH (ref 11.5–15.5)
WBC: 6 10*3/uL (ref 4.0–10.5)

## 2017-10-14 LAB — TROPONIN I: Troponin I: <0.03 ng/mL (ref <0.03)

## 2017-10-14 LAB — CBG MONITORING, ED: Glucose-Capillary: 124 mg/dL — ABNORMAL HIGH (ref 65–99)

## 2017-10-14 MED ORDER — SODIUM CHLORIDE 0.9% FLUSH: 3.0000 mL | Freq: Two times a day (BID) | INTRAVENOUS | Status: DC

## 2017-10-14 MED ORDER — HYDROCODONE-ACETAMINOPHEN 5-325 MG PO TABS
1.0000 | ORAL_TABLET | Freq: Four times a day (QID) | ORAL | Status: DC | PRN
  Administered 2017-10-14 – 2017-10-15 (×2): 1 via ORAL
  Filled 2017-10-14 (×2): qty 1

## 2017-10-14 MED ORDER — ASPIRIN EC 81 MG PO TBEC
81.0000 mg | DELAYED_RELEASE_TABLET | Freq: Every day | ORAL | Status: DC
  Administered 2017-10-14 – 2017-10-15 (×2): 81 mg via ORAL
  Filled 2017-10-14 (×2): qty 1

## 2017-10-14 MED ORDER — AMLODIPINE BESYLATE 5 MG PO TABS
5.0000 mg | ORAL_TABLET | Freq: Every day | ORAL | Status: DC
  Administered 2017-10-14 – 2017-10-15 (×2): 5 mg via ORAL
  Filled 2017-10-14 (×2): qty 1

## 2017-10-14 MED ORDER — ZOLPIDEM TARTRATE 5 MG PO TABS
5.0000 mg | ORAL_TABLET | Freq: Every evening | ORAL | Status: DC | PRN
  Administered 2017-10-14: 5 mg via ORAL
  Filled 2017-10-14: qty 1

## 2017-10-14 MED ORDER — ONDANSETRON HCL 4 MG/2ML IJ SOLN: 4.0000 mg | Freq: Three times a day (TID) | INTRAMUSCULAR | Status: DC | PRN

## 2017-10-14 MED ORDER — ACETAMINOPHEN 325 MG PO TABS
650.0000 mg | ORAL_TABLET | Freq: Four times a day (QID) | ORAL | Status: DC | PRN
  Administered 2017-10-15: 650 mg via ORAL
  Filled 2017-10-14: qty 2

## 2017-10-14 MED ORDER — LORATADINE 10 MG PO TABS
10.0000 mg | ORAL_TABLET | Freq: Every day | ORAL | Status: DC
  Administered 2017-10-14 – 2017-10-15 (×2): 10 mg via ORAL
  Filled 2017-10-14 (×2): qty 1

## 2017-10-14 MED ORDER — SODIUM CHLORIDE 0.9 % IV SOLN
INTRAVENOUS | Status: DC
  Administered 2017-10-14 – 2017-10-15 (×3): via INTRAVENOUS

## 2017-10-14 MED ORDER — HYDRALAZINE HCL 20 MG/ML IJ SOLN
5.0000 mg | INTRAMUSCULAR | Status: DC | PRN
  Administered 2017-10-14: 5 mg via INTRAVENOUS
  Filled 2017-10-14: qty 1

## 2017-10-14 NOTE — Progress Notes (Signed)
   10/14/17 1722  PT Time Calculation  PT Start Time (ACUTE ONLY) 1639  PT Stop Time (ACUTE ONLY) 1704  PT Time Calculation (min) (ACUTE ONLY) 25 min  PT G-Codes **NOT FOR INPATIENT CLASS**  Functional Assessment Tool Used AM-PAC 6 Clicks Basic Mobility;Clinical judgement  Functional Limitation Mobility: Walking and moving around  Mobility: Walking and Moving Around Current Status (U3414) CI  Mobility: Walking and Moving Around Goal Status (Q3601) CH  PT General Charges  $$ ACUTE PT VISIT 1 Visit  PT Evaluation  $PT Eval Low Complexity 1 Low  PT Treatments  $Gait Training 8-22 mins  [place] PT [phone]

## 2017-10-14 NOTE — H&P (Addendum)
History and Physical    Gabrielle [name] IEP:[id number] DOB: 1935-02-24 DOA: 10/13/2017  Referring MD/NP/PA:   PCP: Forrest Moron, MD   Patient coming from:  The patient is coming from home.  At baseline, pt is independent for most of ADL.   Chief Complaint: syncope  HPI: SOL ODOR is a 82 y.o. female with medical history significant of hypertension, hyperlipidemia, diet-controlled diabetes, stroke, gout, PVD, carotid artery stenosis (s/p of L endarterectomy), CKD-3, renal cell cancer (s/p of right nephrectomy 20 years ago), who presents with syncope.  Per her daughter, pt's spouse passed away 3 days ago. Pt has been feeling very sad. Today, she was in farm store with her daughter. She passed out suddenly at about 1:30 PM. Daughter was not there at the time of the syncopal event, but daughter states that the patient regained consciousness a few seconds after her return. She noticed generalized shaking, the patient had no incontinence or significant injury. She did not have any prodrome of lightheadedness. Patient has numbness in the middle of her lower lip. Denies unilateral weakness, numbness or tingling in extremities.  No facial droop, slurred speech, vision change or hearing loss. Patient does not have chest pain, palpitation, shortness breath, fever or chills. No GI symptoms or symptoms of UTI. Patient has hematoma over right upper shin  Which developed after she had a fall several weeks ago.  ED Course: pt was found to have WBC 8.5, negative troponin, stable renal function, temperature normal, no tachycardia, no tachypnea, oxygen saturation 99% on room air. Patient is placed on telemetry bed for observation.  Review of Systems:   General: no fevers, chills, no body weight gain, has poor appetite, has fatigue HEENT: no blurry vision, hearing changes or sore throat Respiratory: no dyspnea, coughing, wheezing CV: no chest pain, no palpitations GI: no nausea, vomiting, abdominal  pain, diarrhea, constipation GU: no dysuria, burning on urination, increased urinary frequency, hematuria  Ext: no leg edema Neuro: no unilateral weakness, numbness, or tingling, no vision change or hearing loss. Had syncope. Skin: no rash, no skin tear. MSK: No muscle spasm, no deformity, no limitation of range of movement in spin Heme: No easy bruising.  Travel history: No recent long distant travel.  Allergy:  Allergies  Allergen Reactions  . Codeine Nausea And Vomiting  . Coreg [Carvedilol] Nausea And Vomiting  . Fentanyl   . Lacosamide      Other name is, VIMPAT  . Levetiracetam Other (See Comments)    Strange feelings in head  . Metformin And Related   . Metoprolol   . Morphine And Related Itching  . Oxybutynin Itching  . Sertraline Nausea Only and Other (See Comments)    Swelling in mouth  . Tessalon [Benzonatate]   . Other Rash    BETA BLOCKER    Past Medical History:  Diagnosis Date  . Arthritis   . Azotemia   . Carotid artery disease ([place])   . Claudication ([place])   . CVA (cerebral vascular accident) ([place])    [place] DIZZINESS 2008  . Diabetes mellitus    1990  . Essential hypertension, benign   . History of anemia   . History of gout   . Hyperlipidemia   . Neck pain   . PAD (peripheral artery disease) (HCC)    hx. LCEA, hx Lt renal art. stenting, occl rt renal artery, occluded bil SFAs, moderatie iliac disease,    . Renal cell cancer ([place]) 2001  . Seizure ([place])  Past Surgical History:  Procedure Laterality Date  . CAROTID ENDARTERECTOMY     LEFT  . NEPHRECTOMY     RIGHT  . NM MYOCAR PERF WALL MOTION  07/12/2011   normal  . PV angiogram  2004   Lt renal artery stent  . URETERAL STENT PLACEMENT     LEFT    Social History:  reports that she quit smoking about 11 years ago. She has never used smokeless tobacco. She reports that she does not drink alcohol or use drugs.  Family History:  Family History  Problem Relation Age of Onset  . Cancer  Mother   . Heart disease Father      Prior to Admission medications   Medication Sig Start Date End Date Taking? Authorizing Provider  amLODipine (NORVASC) 5 MG tablet Take 1 tablet (5 mg total) by mouth daily. 09/25/17  Yes Delia Chimes A, MD  aspirin 81 MG tablet Take 81 mg by mouth daily.   Yes [provider]  cetirizine (ZYRTEC) 5 MG tablet Take 1 tablet (5 mg total) by mouth daily. 06/10/17  Yes Tenna Delaine D, PA-C  clopidogrel (PLAVIX) 75 MG tablet Take 1 tablet (75 mg total) by mouth daily. 09/25/17  Yes Forrest Moron, MD  HYDROcodone-acetaminophen (NORCO) 5-325 MG tablet Take 1 tablet by mouth every 6 (six) hours as needed for moderate pain. 09/20/17  Yes Sagardia, Ines Bloomer, MD  traMADol (ULTRAM) 50 MG tablet Take 1 tablet (50 mg total) by mouth every 8 (eight) hours as needed. 09/27/17  Yes English, Colletta Maryland D, PA  zolpidem (AMBIEN CR) 6.25 MG CR tablet Take 1 tablet (6.25 mg total) by mouth at bedtime as needed for sleep. 09/25/17  Yes Stallings, Zoe A, MD  silver sulfADIAZINE (SILVADENE) 1 % cream Apply 1 application topically daily. Patient not taking: Reported on 10/13/2017 09/25/17   Forrest Moron, MD    Physical Exam: Vitals:   10/13/17 1446 10/13/17 1522 10/13/17 2153 10/13/17 2244  BP: (!) 98/44 (!) 127/54 (!) 187/61 (!) 141/53  Pulse: 83  92 75  Resp: 17  18 10   Temp: 98.1 F (36.7 C)  98.4 F (36.9 C)   TempSrc: Oral  Oral   SpO2: 99%  99% 100%   General: Not in acute distress HEENT:       Eyes: PERRL, EOMI, no scleral icterus.       ENT: No discharge from the ears and nose, no pharynx injection, no tonsillar enlargement.        Neck: No JVD, no bruit, no mass felt. Heme: No neck lymph node enlargement. Cardiac: S1/S2, RRR, No murmurs, No gallops or rubs. Respiratory: No rales, wheezing, rhonchi or rubs. GI: Soft, nondistended, nontender, no rebound pain, no organomegaly, BS present. GU: No hematuria Ext: No pitting leg edema bilaterally.  2+DP/PT pulse bilaterally. Has a hematoma in right upper shin, approximately 4 x 10 cm in size. Musculoskeletal: No joint deformities, No joint redness or warmth, no limitation of ROM in spin. Skin: No rashes.  Neuro: Alert, oriented X3, cranial nerves II-XII grossly intact, moves all extremities normally. Muscle strength 5/5 in all extremities, sensation to light touch intact. Brachial reflex 2+ bilaterally.Negative Babinski's sign. Normal finger to nose test. Psych: Patient is not psychotic, no suicidal or hemocidal ideation.  Labs on Admission: I have personally reviewed following labs and imaging studies  CBC:  Recent Labs Lab 10/13/17 1533  WBC 8.5  HGB 9.7*  HCT 30.9*  MCV 78.6  PLT  854*   Basic Metabolic Panel:  Recent Labs Lab 10/13/17 1533  NA 141  K 4.3  CL 109  CO2 25  GLUCOSE 80  BUN 24*  CREATININE 1.76*  CALCIUM 9.1   GFR: Estimated Creatinine Clearance: 19.7 mL/min (A) (by C-G formula based on SCr of 1.76 mg/dL (H)). Liver Function Tests: No results for input(s): AST, ALT, ALKPHOS, BILITOT, PROT, ALBUMIN in the last 168 hours. No results for input(s): LIPASE, AMYLASE in the last 168 hours. No results for input(s): AMMONIA in the last 168 hours. Coagulation Profile: No results for input(s): INR, PROTIME in the last 168 hours. Cardiac Enzymes:  Recent Labs Lab 10/13/17 2234  TROPONINI <0.03   BNP (last 3 results) No results for input(s): PROBNP in the last 8760 hours. HbA1C: No results for input(s): HGBA1C in the last 72 hours. CBG:  Recent Labs Lab 10/13/17 1443  GLUCAP 75   Lipid Profile: No results for input(s): CHOL, HDL, LDLCALC, TRIG, CHOLHDL, LDLDIRECT in the last 72 hours. Thyroid Function Tests: No results for input(s): TSH, T4TOTAL, FREET4, T3FREE, THYROIDAB in the last 72 hours. Anemia Panel: No results for input(s): VITAMINB12, FOLATE, FERRITIN, TIBC, IRON, RETICCTPCT in the last 72 hours. Urine analysis:    Component Value  Date/Time   COLORURINE YELLOW 10/13/2017 1445   APPEARANCEUR HAZY (A) 10/13/2017 1445   LABSPEC 1.019 10/13/2017 1445   PHURINE 5.0 10/13/2017 1445   GLUCOSEU 50 (A) 10/13/2017 1445   HGBUR NEGATIVE 10/13/2017 1445   BILIRUBINUR NEGATIVE 10/13/2017 1445   BILIRUBINUR negative 08/15/2016 1029   BILIRUBINUR neg 04/10/2015 1406   KETONESUR NEGATIVE 10/13/2017 1445   PROTEINUR 100 (A) 10/13/2017 1445   UROBILINOGEN 0.2 08/15/2016 1029   UROBILINOGEN 0.2 12/23/2014 1736   NITRITE NEGATIVE 10/13/2017 1445   LEUKOCYTESUR NEGATIVE 10/13/2017 1445   Sepsis Labs: @LABRCNTIP (procalcitonin:4,lacticidven:4) )No results found for this or any previous visit (from the past 240 hour(s)).   Radiological Exams on Admission: No results found.   EKG: Independently reviewed.  Sinus rhythm, QTC 422, no ischemic change.  Assessment/Plan Principal Problem:   Syncope Active Problems:   Essential hypertension, benign   History of anemia   CVA (cerebral vascular accident) ([place])   Carotid artery disease ([place])   Traumatic hematoma of right lower leg   CKD (chronic kidney disease), stage III (HCC)   Syncope:  Etiology is not clear. The differential diagnosis is broad, including vasovagal syncope, seizure, TIA/stroke, arrhythmia, ACS (less likely, given no chest pain), orthostatic status, carotid artery stenosis.  - Please on tele bed for obs - Orthostatic vital signs  - Carotid doppler, MRI-brain - EEG - 2d echo - Neuro checks  - IVF: NS 100 cc/h - PT/OT eval and treat  Essential hypertension, benign: -Continue amlodipine -IVF hydralazine when necessary  History of anemia: hemoglobin is close to baseline. Her hemoglobin was 10.2 on 03/10/17--> 9.7 on admission. -f/u by CBC  CVA (cerebral vascular accident) [facility]): -continue ASA -hold plavix due to hematoma  Hx of Carotid artery stenosis: s/p of left endarterectomy -f/u Carotid doppler, -on ASA  CKD (chronic kidney disease), stage III  ([place]): stable. Baseline creatinine 1.6-1.7. Her creatinine is 1.76, bun 24. Follow-up renal function. BMP  Traumatic hematoma of right lower leg: No signs of infection. -continue to consult to surgery for draining  DVT ppx: SCD to left leg Code Status: DNR (I discussed with patient in the presence of her daughter, and explained the meaning of CODE STATUS. Patient wants to be DNR) Family  Communication:  Yes, patient's daughter at bed side Disposition Plan:  Anticipate discharge back to previous home environment Consults called:  none Admission status: Obs / tele     Date of Service 10/14/2017    Ivor Costa [facility] Pager [phone]  If 7PM-7AM, please contact night-coverage [email or web address] Password [email or web address] 10/14/2017, 1:30 AM

## 2017-10-14 NOTE — Progress Notes (Signed)
PROGRESS NOTE  Subjective: Gabrielle [name] is a 82 y.o. female with a history of T2DM, CVA, carotid stenosis s/p left CEA, stage III CKD, HTN, and recent bereavement of her husband who presented to the ED after unwitnessed syncope. Her daughter reports a store clerk told her that Gabrielle [name] spontaneously lost consciousness and fell to the floor, was unconscious for about 1 minute by her estimation during which time she had some body-wide shaking without urinary incontinence and spontaneously regained consciousness and appeared groggy for < 5 minutes afterward. She never had focal deficits, no significant trauma was noted. On arrival she was hypotensive to 98/44, and was admitted for syncope work up including EEG and MRI brain.   She has no memory of any of these events, reports not eating/drinking very much since her husband died 3 days PTA. Her daughter tells me the funeral service was this morning. Several weeks ago she stumbled and hit her right shin. She's had a stable large hematoma over the area ever since.   Objective: BP (!) 168/56 (BP Location: Right Arm)   Pulse 71   Temp 98 F (36.7 C) (Oral)   Resp 16   Ht 5\' 1"  (1.549 m)   Wt 59 kg (130 lb 1.1 oz)   SpO2 98%   BMI 24.58 kg/m   Gen: Elderly female with flat affect Neck: No carotid bruits, scar over left carotid. Pulm: Clear and nonlabored on room air  CV: RRR, no murmur, no JVD, no edema GI: Soft, NT, ND, +BS  Neuro: Alert and oriented. No focal deficits. Skin: Large purple hematoma on anterior upper right shin without fluctuance or erythema or warmth. No bony deformity noted.   Assessment & Plan: Principal Problem:   Syncope Active Problems:   Essential hypertension, benign   History of anemia   CVA (cerebral vascular accident) ([place])   Carotid artery disease ([place])   Traumatic hematoma of right lower leg   CKD (chronic kidney disease), stage III (HCC)  Syncope: Features consistent with orthostatic/dehydration due to  recent poor po intake due to bereavement, possibly vasovagal related to stress, and possibility of new onset seizure, though this is less likely, especially with no acute changes on MRI brain.  - Continue telemetry - Echocardiogram ordered - Carotid U/S without critical stenosis, follow up with vascular surgery as outpatient.  - EEG ordered  - PO intake encouraged - PT evaluation: No PT follow up required, has supervision at home with daughter.   Right shin hematoma: No recent changes.  - Has outpatient surgical evaluation scheduled this week.   Other plan per H&P from this morning.   Vance Gather, MD [facility] Pager [phone] 10/14/2017, 6:24 PM

## 2017-10-14 NOTE — Progress Notes (Signed)
*  PRELIMINARY RESULTS* Vascular Ultrasound Carotid Duplex (Doppler) has been completed.  Preliminary findings: Right 40-59% ICA stenosis, high end of range. Left 1-39% ICA stenosis.  Antegrade vertebral flow.   Landry Mellow, RDMS, RVT  10/14/2017, 10:10 AM

## 2017-10-14 NOTE — Evaluation (Signed)
Physical Therapy Evaluation Patient Details Name: Gabrielle [name] MRN: [id number] DOB: [birth year] Today's Date: 10/14/2017   History of Present Illness  Gabrielle [name] is a 82 y.o. female with medical history significant of hypertension, hyperlipidemia, diet-controlled diabetes, stroke, gout, PVD, carotid artery stenosis (s/p of L endarterectomy), CKD-3, renal cell cancer (s/p of right nephrectomy 20 years ago), who presents with syncope.  Clinical Impression  The patient is somewhat weak, did ambulate x 100' with RW. Plans to stay with daughter who workss at night. Pt admitted with above diagnosis. Pt currently with functional limitations due to the deficits listed below (see PT Problem List).  Pt will benefit from skilled PT to increase their independence and safety with mobility to allow discharge to the venue listed below.   Should progress to return to independent level .     Follow Up Recommendations No PT follow up    Equipment Recommendations  None recommended by PT    Recommendations for Other Services       Precautions / Restrictions Precautions Precautions: Fall Precaution Comments: monitor BP      Mobility  Bed Mobility Overal bed mobility: Independent                Transfers Overall transfer level: Needs assistance Equipment used: Rolling walker (2 wheeled) Transfers: Sit to/from Stand Sit to Stand: Supervision            Ambulation/Gait Ambulation/Gait assistance: Min guard Ambulation Distance (Feet): 100 Feet Assistive device: Rolling walker (2 wheeled) Gait Pattern/deviations: Step-through pattern     General Gait Details: slow speed, BP 164/48  Stairs            Wheelchair Mobility    Modified Rankin (Stroke Patients Only)       Balance Overall balance assessment: Needs assistance   Sitting balance-Leahy Scale: Normal     Standing balance support: No upper extremity supported;During functional activity Standing  balance-Leahy Scale: Fair                               Pertinent Vitals/Pain Pain Assessment: No/denies pain    Home Living Family/patient expects to be discharged to:: Private residence Living Arrangements: Alone;Children Available Help at Discharge: Family Type of Home: House Home Access: Stairs to enter Entrance Stairs-Rails: None Entrance Stairs-Number of Steps: 3 Home Layout: One level Home Equipment: Walker - 2 wheels Additional Comments: plans to stay with daughter Gabrielle [name] in law who work nights. grandson will be in house.    Prior Function Level of Independence: Independent               Hand Dominance        Extremity/Trunk Assessment   Upper Extremity Assessment Upper Extremity Assessment: Generalized weakness    Lower Extremity Assessment Lower Extremity Assessment: Generalized weakness    Cervical / Trunk Assessment Cervical / Trunk Assessment: Normal  Communication   Communication: No difficulties  Cognition Arousal/Alertness: Awake/alert Behavior During Therapy: WFL for tasks assessed/performed Overall Cognitive Status: Within Functional Limits for tasks assessed                                 General Comments: seems down, recently lost spouse      General Comments      Exercises     Assessment/Plan    PT Assessment Patient needs continued PT services  PT  Problem List Decreased strength;Decreased activity tolerance;Decreased mobility;Decreased knowledge of use of DME;Decreased safety awareness;Decreased knowledge of precautions       PT Treatment Interventions DME instruction;Gait training;Functional mobility training;Therapeutic activities    PT Goals (Current goals can be found in the Care Plan section)  Acute Rehab PT Goals Patient Stated Goal: to go home PT Goal Formulation: With patient/family Time For Goal Achievement: 10/21/17 Potential to Achieve Goals: Good    Frequency Min 3X/week    Barriers to discharge Decreased caregiver support      Co-evaluation               AM-PAC PT "6 Clicks" Daily Activity  Outcome Measure Difficulty turning over in bed (including adjusting bedclothes, sheets and blankets)?: None Difficulty moving from lying on back to sitting on the side of the bed? : None Difficulty sitting down on and standing up from a chair with arms (e.g., wheelchair, bedside commode, etc,.)?: A Little Help needed moving to and from a bed to chair (including a wheelchair)?: A Little Help needed walking in hospital room?: A Little Help needed climbing 3-5 steps with a railing? : A Lot 6 Click Score: 19    End of Session Equipment Utilized During Treatment: Gait belt Activity Tolerance: Patient tolerated treatment well Patient left: in bed;with call bell/phone within reach;with family/visitor present Nurse Communication: Mobility status      Time: 1062-6948 PT Time Calculation (min) (ACUTE ONLY): 25 min   Charges:   PT Evaluation $PT Eval Low Complexity: 1 Low PT Treatments $Gait Training: 8-22 mins   PT G CodesTresa [name] PT [phone]   Gabrielle [name] 10/14/2017, 5:22 PM

## 2017-10-15 ENCOUNTER — Inpatient Hospital Stay (HOSPITAL_COMMUNITY): Payer: Medicare Other

## 2017-10-15 DIAGNOSIS — I6523 Occlusion and stenosis of bilateral carotid arteries: Secondary | ICD-10-CM

## 2017-10-15 DIAGNOSIS — I10 Essential (primary) hypertension: Secondary | ICD-10-CM

## 2017-10-15 LAB — VAS US CAROTID
LEFT ECA DIAS: 0 cm/s
LEFT VERTEBRAL DIAS: 13 cm/s
Left CCA dist dias: 16 cm/s
Left CCA dist sys: 107 cm/s
Left CCA prox dias: 11 cm/s
Left CCA prox sys: 124 cm/s
Left ICA dist dias: -14 cm/s
Left ICA dist sys: -78 cm/s
Left ICA prox dias: -14 cm/s
Left ICA prox sys: -92 cm/s
RIGHT ECA DIAS: 5 cm/s
RIGHT VERTEBRAL DIAS: 6 cm/s
Right CCA prox dias: 4 cm/s
Right CCA prox sys: 128 cm/s
Right cca dist sys: -89 cm/s

## 2017-10-15 LAB — ECHOCARDIOGRAM COMPLETE
Height: 61 in
Weight: 2081.14 oz

## 2017-10-15 LAB — GLUCOSE, CAPILLARY: Glucose-Capillary: 98 mg/dL (ref 65–99)

## 2017-10-15 NOTE — Progress Notes (Signed)
  Echocardiogram 2D Echocardiogram has been performed.  Jennette Dubin 10/15/2017, 12:12 PM

## 2017-10-15 NOTE — Discharge Summary (Signed)
Physician Discharge Summary  Gabrielle [name] SWN:[id number] DOB: 02-Feb-1935 DOA: 10/13/2017  PCP: Forrest Moron, MD  Admit date: 10/13/2017 Discharge date: 10/15/2017  Admitted From: Home Disposition: Home   Recommendations for Outpatient Follow-up:  1. Follow up with PCP in 1-2 weeks. Admitted for syncope, suspect due to dehydration/orthostasis.  2. Strongly recommend outpatient referral for counseling with her son and husband having recently passed away.  3. Follow up with general surgery regarding RLE hematoma 4. Recommend EEG as outpatient per neurology recommendations.   Home Health: None Equipment/Devices: None Discharge Condition: Stable CODE STATUS: DNR Diet recommendation: Heart healthy advised  Brief/Interim Summary: Gabrielle [name] is a 82 y.o. female with a history of T2DM, CVA, carotid stenosis s/p left CEA, stage III CKD, HTN, and recent bereavement of her husband who presented to the ED after unwitnessed syncope. Her daughter reports a store clerk told her that Mrs. Mcguinn spontaneously lost consciousness and fell to the floor, was unconscious for about 1 minute by her estimation during which time she had some body-wide shaking without urinary incontinence and spontaneously regained consciousness and appeared groggy for < 5 minutes afterward. She never had focal deficits, no significant trauma was noted. On arrival she was hypotensive to 98/44, and was admitted for syncope work up. MRI brain showed no acute abnormalities and neurology consultant recommended outpatient EEG. Echocardiogram showed no major valuvular abnormalities and normal LV systolic function. Telemetry demonstrated no persistent arrhythmias.  She has no memory of any of these events, reports not eating/drinking very much since her husband died 3 days PTA. Her daughter tells me the funeral service was this morning. Several weeks ago she stumbled and hit her right shin. She's had a stable large hematoma  over the area ever since.   Discharge Diagnoses:  Principal Problem:   Syncope Active Problems:   Essential hypertension, benign   History of anemia   CVA (cerebral vascular accident) ([facility])   Carotid artery disease ([facility])   Traumatic hematoma of right lower leg   CKD (chronic kidney disease), stage III ([facility])  Syncope: Features consistent with orthostatic/dehydration due to recent poor po intake due to bereavement, possibly vasovagal related to stress, and possibility of seizure, though this is felt to be less likely. No acute changes on MRI brain. No perfusion-compromising arrhythmias on telemetry. Echocardiogram w/EF 65-70%, G1DD, no significant aortic stenosis.  - Encourage po hydration, continue antihypertensives.  - PT evaluation: No PT follow up required, has supervision at home with daughter.   Carotid artery stenosis s/p left CEA:  - Carotid U/S here without critical stenosis (preliminary findings 40-59% right ICA stenosis, 1-39% left ICA stenosis): follow up with vascular surgery as outpatient.   Right shin hematoma: No recent changes.  - Has outpatient surgical evaluation scheduled this week.   Possible history of seizures:  - Neurology, Dr. Rory Percy, reviewed case personally, recommended outpatient EEG and starting no empiric antiepileptic drugs. The patient has not had any witnessed seizure-like activity during hospitalization.  - Recommend follow up with neurology, has seen Dr. Brett Fairy in the past for insomnia. Will send DC summary and have patient call to schedule soon. - Driving restrictions advised until follow up.  Stage III CKD: Chronic, stable.  Normocytic anemia: Chronic, hgb down with IV rehydration without bleeding.  - Suggest recheck CBC with anemia panel.  - No indication for transfusion.  Right lower back pain: Chronic, stable. - Will hold ultram given concern for lowering seizure threshold. Replace with hydrocodone (has tolerated this)  for her chronic right  back/sciatica pain.   Bereavement: Recently had son and more recently her husband pass away.  - Recommend ongoing emotional support and referral to counseling as outpatient.   Discharge Instructions Discharge Instructions    Discharge instructions    Complete by:  As directed    You were admitted for an episode of syncope which may have been due to dehydration. There is no evidence of damage to your heart or abnormalities in the carotid arteries or brain to explain this. After discussion with neurology, it is recommended that you follow up with your neurologist, Dr. Brett Fairy for evaluation including an EEG. Until you follow up you should not drive, climb ladders, or go swimming.  - Stay hydrated - Stop taking ultram, as this can lower the seizure threshold and predispose to having seizures. Continue taking hydrocodone as needed for right lower back/leg pain.  - Follow up with your primary care provider in the next 1-2 weeks for lab recheck and hospital follow up.  - If your symptoms return, seek medical attention right away.   Increase activity slowly    Complete by:  As directed      Allergies as of 10/15/2017      Reactions   Codeine Nausea And Vomiting   Coreg [carvedilol] Nausea And Vomiting   Fentanyl    Lacosamide     Other name is, VIMPAT   Levetiracetam Other (See Comments)   Strange feelings in head   Metformin And Related    Metoprolol    Morphine And Related Itching   Oxybutynin Itching   Sertraline Nausea Only, Other (See Comments)   Swelling in mouth   Tessalon [benzonatate]    Other Rash   BETA BLOCKER      Medication List    STOP taking these medications   traMADol 50 MG tablet Commonly known as:  ULTRAM     TAKE these medications   amLODipine 5 MG tablet Commonly known as:  NORVASC Take 1 tablet (5 mg total) by mouth daily.   aspirin 81 MG tablet Take 81 mg by mouth daily.   cetirizine 5 MG tablet Commonly known as:  ZYRTEC Take 1 tablet (5 mg  total) by mouth daily.   clopidogrel 75 MG tablet Commonly known as:  PLAVIX Take 1 tablet (75 mg total) by mouth daily.   HYDROcodone-acetaminophen 5-325 MG tablet Commonly known as:  NORCO Take 1 tablet by mouth every 6 (six) hours as needed for moderate pain.   silver sulfADIAZINE 1 % cream Commonly known as:  SILVADENE Apply 1 application topically daily.   zolpidem 6.25 MG CR tablet Commonly known as:  AMBIEN CR Take 1 tablet (6.25 mg total) by mouth at bedtime as needed for sleep.      Follow-up Information    Forrest Moron, MD. Schedule an appointment as soon as possible for a visit in 1 week(s).   Specialty:  Internal Medicine Contact information: [place] [address] [phone]        Dohmeier, Asencion Partridge, MD. Schedule an appointment as soon as possible for a visit in 1 week(s).   Specialty:  Neurology Why:  for EEG, evaluation for possible seizures.  Contact information: [address] [place] [address] [phone]          Allergies  Allergen Reactions  . Codeine Nausea And Vomiting  . Coreg [Carvedilol] Nausea And Vomiting  . Fentanyl   . Lacosamide  Other name is, VIMPAT  . Levetiracetam Other (See Comments)    Strange feelings in head  . Metformin And Related   . Metoprolol   . Morphine And Related Itching  . Oxybutynin Itching  . Sertraline Nausea Only and Other (See Comments)    Swelling in mouth  . Tessalon [Benzonatate]   . Other Rash    BETA BLOCKER    Consultations:  Neurology, Dr. Rory Percy by phone  Procedures/Studies: Dg Tibia/fibula Right  Result Date: 09/20/2017 CLINICAL DATA:  Hip trauma with subsequent pain. EXAM: RIGHT TIBIA AND FIBULA - 2 VIEW; DG HIP (WITH OR WITHOUT PELVIS) 2-3V RIGHT COMPARISON:  Coronal and sagittal CT images through the pelvis and right hip dated June 28, 2016. FINDINGS: Right hip: The bones are subjectively adequately mineralized. There is no lytic or blastic lesion.  AP and lateral views of the right hip reveal preservation of the joint space. The femoral head, neck, intertrochanteric, and subtrochanteric regions are normal. Right tibia and fibula: The bones are subjectively adequately mineralized. There is no acute fracture or dislocation. There is no lytic or blastic lesion or periosteal reaction. The observed portions of the right knee are normal. There is mild narrowing of the ankle joint mortise. The soft tissues are unremarkable. IMPRESSION: There is no acute or significant chronic bony abnormality of the right hip. There is no acute or significant chronic bony abnormality of the right tibia or fibula. Electronically Signed   By: David  Martinique M.D.   On: 09/20/2017 12:48   Mr Brain Wo Contrast  Result Date: 10/14/2017 CLINICAL DATA:  Syncopal episode. No reported lateralizing weakness or numbness. Patient is on anticoagulation. Stroke risk factors include hypertension, diabetes, and dyslipidemia. EXAM: MRI HEAD WITHOUT CONTRAST TECHNIQUE: Multiplanar, multiecho pulse sequences of the brain and surrounding structures were obtained without intravenous contrast. COMPARISON:  MR head 03/10/2017.  CT head 03/10/2017. FINDINGS: Brain: No evidence for acute infarction, hemorrhage, mass lesion, hydrocephalus, or extra-axial fluid. Generalized atrophy, not unexpected for age. Extensive T2 and FLAIR hyperintensities throughout the white matter, also affecting the brainstem, consistent with small vessel disease. Vascular: Flow voids are maintained throughout the carotid, basilar, and vertebral arteries. There are no areas of chronic hemorrhage. Skull and upper cervical spine: Unremarkable visualized calvarium, skullbase, and cervical vertebrae. Partial empty sella. Pineal and cerebellar tonsils unremarkable. No upper cervical cord lesions. Degenerative anterolisthesis at C2-3, C3-4, and C4-5. Sinuses/Orbits: No significant sinus fluid accumulation. Negative orbits with BILATERAL  cataract extraction. Other: No significant change from priors. IMPRESSION: Atrophy and small vessel disease.  No acute intracranial findings. Similar appearance to previous CT and MR imaging from March 2018. Electronically Signed   By: Staci Righter M.D.   On: 10/14/2017 13:09   Dg Hip Unilat W Or W/o Pelvis 2-3 Views Right  Result Date: 09/20/2017 CLINICAL DATA:  Hip trauma with subsequent pain. EXAM: RIGHT TIBIA AND FIBULA - 2 VIEW; DG HIP (WITH OR WITHOUT PELVIS) 2-3V RIGHT COMPARISON:  Coronal and sagittal CT images through the pelvis and right hip dated June 28, 2016. FINDINGS: Right hip: The bones are subjectively adequately mineralized. There is no lytic or blastic lesion. AP and lateral views of the right hip reveal preservation of the joint space. The femoral head, neck, intertrochanteric, and subtrochanteric regions are normal. Right tibia and fibula: The bones are subjectively adequately mineralized. There is no acute fracture or dislocation. There is no lytic or blastic lesion or periosteal reaction. The observed portions of the right knee are normal.  There is mild narrowing of the ankle joint mortise. The soft tissues are unremarkable. IMPRESSION: There is no acute or significant chronic bony abnormality of the right hip. There is no acute or significant chronic bony abnormality of the right tibia or fibula. Electronically Signed   By: David  Martinique M.D.   On: 09/20/2017 12:48    Echocardiogram 10/14/2017: Study Conclusions - Left ventricle: The cavity size was normal. Wall thickness was   normal. Systolic function was vigorous. The estimated ejection   fraction was in the range of 65% to 70%. Wall motion was normal;   there were no regional wall motion abnormalities. Doppler   parameters are consistent with abnormal left ventricular   relaxation (grade 1 diastolic dysfunction). Doppler parameters   are consistent with high ventricular filling pressure. - Aortic valve: There was trivial  regurgitation. - Mitral valve: Calcified annulus. - Left atrium: The atrium was mildly dilated. - Pulmonary arteries: PA peak pressure: 34 mm Hg (S).  Impressions:  - Vigorous LV systolic function; mild diastolic dysfunction;   elevated LV filling pressure; trace AM; mild LAE.  Subjective: Having flare of stable right lower back pain radiating down right leg due to sitting in bed.   Discharge Exam: Vitals:   10/15/17 0020 10/15/17 0445  BP:  (!) 169/62  Pulse: 80 97  Resp: 16 16  Temp: 98 F (36.7 C) 98.6 F (37 C)  SpO2:  99%   Gen: Elderly female with flat affect Neck: No carotid bruits, scar over left carotid. Pulm: Clear and nonlabored on room air  CV: RRR, no murmur, no JVD, no edema GI: Soft, NT, ND, +BS  MSK: No visible or palpable abnormality in the lower back.  Neuro: Alert and oriented. No focal deficits. RLE with intact sensation and strength.  Skin: Large purple hematoma on anterior upper right shin without fluctuance or erythema or warmth. No bony deformity noted.   Labs: Basic Metabolic Panel:  Recent Labs Lab 10/13/17 1533 10/14/17 0430  NA 141 137  K 4.3 3.9  CL 109 105  CO2 25 22  GLUCOSE 80 121*  BUN 24* 24*  CREATININE 1.76* 1.77*  CALCIUM 9.1 8.3*   CBC:  Recent Labs Lab 10/13/17 1533 10/14/17 0430  WBC 8.5 6.0  HGB 9.7* 8.3*  HCT 30.9* 24.8*  MCV 78.6 77.7*  PLT 487* 391   Cardiac Enzymes:  Recent Labs Lab 10/13/17 2234  TROPONINI <0.03   CBG:  Recent Labs Lab 10/13/17 1443 10/14/17 0448 10/14/17 0545 10/14/17 0746 10/15/17 0737  GLUCAP 75 124* 115* 80 98   Urinalysis    Component Value Date/Time   COLORURINE YELLOW 10/13/2017 1445   APPEARANCEUR HAZY (A) 10/13/2017 1445   LABSPEC 1.019 10/13/2017 1445   PHURINE 5.0 10/13/2017 1445   GLUCOSEU 50 (A) 10/13/2017 1445   HGBUR NEGATIVE 10/13/2017 [place] 10/13/2017 1445   BILIRUBINUR negative 08/15/2016 1029   BILIRUBINUR neg 04/10/2015  1406   KETONESUR NEGATIVE 10/13/2017 1445   PROTEINUR 100 (A) 10/13/2017 1445   UROBILINOGEN 0.2 08/15/2016 1029   UROBILINOGEN 0.2 12/23/2014 1736   NITRITE NEGATIVE 10/13/2017 1445   LEUKOCYTESUR NEGATIVE 10/13/2017 1445    Time coordinating discharge: Approximately 40 minutes  Vance Gather, MD  [facility] 10/15/2017, 2:06 PM Pager [phone]

## 2017-10-15 NOTE — Progress Notes (Signed)
Pt discharged to home. DC instructions given with daughter at bedside. No prescriptions given. Pt encouraged to call and schedule f/u appointment with providers as ordered by [facility]. Pt voiced understanding. Left unit in wheelchair pushed by nursing student. Left in stable condition. Gabrielle [name].

## 2017-10-16 ENCOUNTER — Telehealth: Payer: Self-pay

## 2017-10-16 NOTE — Telephone Encounter (Signed)
TOC call- Left message on voicemail for patient to return my call to complete her hospital follow up call.

## 2017-10-17 ENCOUNTER — Telehealth: Payer: Self-pay

## 2017-10-17 NOTE — Telephone Encounter (Signed)
Transition Care Management Follow-up Telephone Call   Date discharged? 10/15/17   How have you been since you were released from the hospital? Patient states that she feels much better since being released. She is due to have surgery on her leg tomorrow.    Do you understand why you were in the hospital? yes   Do you understand the discharge instructions? yes   Where were you discharged to? home   Items Reviewed:  Medications reviewed: yes  Allergies reviewed: yes  Dietary changes reviewed: yes  Referrals reviewed: yes   Functional Questionnaire:   Activities of Daily Living (ADLs):   She states they are independent in the following: ambulation, bathing and hygiene, feeding, continence, grooming, toileting and dressing States they require assistance with the following: none   Any transportation issues/concerns?: no   Any patient concerns? yes   Confirmed importance and date/time of follow-up visits scheduled yes  Provider Appointment booked with Gabrielle [name] on 10/23/17 @ 10:20 am.   Confirmed with patient if condition begins to worsen call PCP or go to the ER.  Patient was given the office number and encouraged to call back with question or concerns.  : yes

## 2017-10-18 ENCOUNTER — Ambulatory Visit: Payer: Self-pay | Admitting: General Surgery

## 2017-10-18 DIAGNOSIS — S8011XA Contusion of right lower leg, initial encounter: Secondary | ICD-10-CM | POA: Diagnosis not present

## 2017-10-19 NOTE — Progress Notes (Signed)
Pt's PMH, labs and recent admission reviewed by Dr. Ambrose Pancoast. Pt's leg surgery will need to be done at the Main OR.  Dr. Biagio Borg office notified.

## 2017-10-23 ENCOUNTER — Ambulatory Visit (INDEPENDENT_AMBULATORY_CARE_PROVIDER_SITE_OTHER): Payer: Medicare Other | Admitting: Family Medicine

## 2017-10-23 ENCOUNTER — Ambulatory Visit: Payer: Medicare Other | Admitting: Physician Assistant

## 2017-10-23 ENCOUNTER — Encounter (HOSPITAL_COMMUNITY): Payer: Self-pay

## 2017-10-23 ENCOUNTER — Encounter: Payer: Self-pay | Admitting: Family Medicine

## 2017-10-23 ENCOUNTER — Encounter (HOSPITAL_COMMUNITY)
Admission: RE | Admit: 2017-10-23 | Discharge: 2017-10-23 | Disposition: A | Discharge status: Home / Self Care | Payer: Medicare Other | Source: Ambulatory Visit | Attending: General Surgery | Admitting: General Surgery

## 2017-10-23 VITALS — BP 138/52 | HR 105 | Temp 98.3°F | Resp 16 | Ht 61.0 in | Wt 127.4 lb

## 2017-10-23 DIAGNOSIS — S8011XS Contusion of right lower leg, sequela: Secondary | ICD-10-CM | POA: Diagnosis not present

## 2017-10-23 DIAGNOSIS — N183 Chronic kidney disease, stage 3 (moderate): Secondary | ICD-10-CM

## 2017-10-23 DIAGNOSIS — E1122 Type 2 diabetes mellitus with diabetic chronic kidney disease: Secondary | ICD-10-CM | POA: Diagnosis not present

## 2017-10-23 DIAGNOSIS — R55 Syncope and collapse: Secondary | ICD-10-CM | POA: Diagnosis not present

## 2017-10-23 DIAGNOSIS — Z7982 Long term (current) use of aspirin: Secondary | ICD-10-CM | POA: Diagnosis not present

## 2017-10-23 DIAGNOSIS — S8011XD Contusion of right lower leg, subsequent encounter: Secondary | ICD-10-CM | POA: Diagnosis not present

## 2017-10-23 DIAGNOSIS — I10 Essential (primary) hypertension: Secondary | ICD-10-CM | POA: Diagnosis not present

## 2017-10-23 DIAGNOSIS — W010XXD Fall on same level from slipping, tripping and stumbling without subsequent striking against object, subsequent encounter: Secondary | ICD-10-CM | POA: Diagnosis not present

## 2017-10-23 DIAGNOSIS — Z79899 Other long term (current) drug therapy: Secondary | ICD-10-CM | POA: Diagnosis not present

## 2017-10-23 DIAGNOSIS — G4730 Sleep apnea, unspecified: Secondary | ICD-10-CM | POA: Diagnosis not present

## 2017-10-23 DIAGNOSIS — F4321 Adjustment disorder with depressed mood: Secondary | ICD-10-CM

## 2017-10-23 DIAGNOSIS — Z09 Encounter for follow-up examination after completed treatment for conditions other than malignant neoplasm: Secondary | ICD-10-CM | POA: Diagnosis not present

## 2017-10-23 DIAGNOSIS — D509 Iron deficiency anemia, unspecified: Secondary | ICD-10-CM | POA: Diagnosis not present

## 2017-10-23 DIAGNOSIS — Z87891 Personal history of nicotine dependence: Secondary | ICD-10-CM | POA: Diagnosis not present

## 2017-10-23 DIAGNOSIS — E7800 Pure hypercholesterolemia, unspecified: Secondary | ICD-10-CM | POA: Diagnosis not present

## 2017-10-23 DIAGNOSIS — Z79891 Long term (current) use of opiate analgesic: Secondary | ICD-10-CM | POA: Diagnosis not present

## 2017-10-23 DIAGNOSIS — S8011XA Contusion of right lower leg, initial encounter: Secondary | ICD-10-CM | POA: Diagnosis present

## 2017-10-23 HISTORY — DX: Anxiety disorder, unspecified: F41.9

## 2017-10-23 HISTORY — DX: Depression, unspecified: F32.A

## 2017-10-23 HISTORY — DX: Major depressive disorder, single episode, unspecified: F32.9

## 2017-10-23 HISTORY — DX: Personal history of urinary calculi: Z87.442

## 2017-10-23 LAB — BASIC METABOLIC PANEL
Anion gap: 10 (ref 5–15)
BUN: 16 mg/dL (ref 6–20)
CO2: 24 mmol/L (ref 22–32)
Calcium: 9.1 mg/dL (ref 8.9–10.3)
Chloride: 104 mmol/L (ref 101–111)
Creatinine, Ser: 1.82 mg/dL — ABNORMAL HIGH (ref 0.44–1.00)
GFR calc Af Amer: 29 mL/min — ABNORMAL LOW (ref >60)
GFR calc non Af Amer: 25 mL/min — ABNORMAL LOW (ref >60)
Glucose, Bld: 106 mg/dL — ABNORMAL HIGH (ref 65–99)
Potassium: 4.9 mmol/L (ref 3.5–5.1)
Sodium: 138 mmol/L (ref 135–145)

## 2017-10-23 LAB — CBC
HCT: 32.2 % — ABNORMAL LOW (ref 36.0–46.0)
Hemoglobin: 9.9 g/dL — ABNORMAL LOW (ref 12.0–15.0)
MCH: 24.3 pg — ABNORMAL LOW (ref 26.0–34.0)
MCHC: 30.7 g/dL (ref 30.0–36.0)
MCV: 78.9 fL (ref 78.0–100.0)
Platelets: 459 10*3/uL — ABNORMAL HIGH (ref 150–400)
RBC: 4.08 MIL/uL (ref 3.87–5.11)
RDW: 17 % — ABNORMAL HIGH (ref 11.5–15.5)
WBC: 8.1 10*3/uL (ref 4.0–10.5)

## 2017-10-23 LAB — POCT URINALYSIS DIP (MANUAL ENTRY)
Bilirubin, UA: NEGATIVE
Blood, UA: NEGATIVE
Glucose, UA: NEGATIVE mg/dL
Ketones, POC UA: NEGATIVE mg/dL
Nitrite, UA: NEGATIVE
Protein Ur, POC: 100 mg/dL — AB
Spec Grav, UA: 1.025 (ref 1.010–1.025)
Urobilinogen, UA: 0.2 E.U./dL
pH, UA: 5 (ref 5.0–8.0)

## 2017-10-23 MED ORDER — AMLODIPINE BESYLATE 5 MG PO TABS: 5.0000 mg | ORAL_TABLET | Freq: Every day | ORAL | 3 refills | Status: DC

## 2017-10-23 MED ORDER — ZOLPIDEM TARTRATE ER 6.25 MG PO TBCR: 6.2500 mg | EXTENDED_RELEASE_TABLET | Freq: Every evening | ORAL | 0 refills | Status: DC | PRN

## 2017-10-23 MED ORDER — FERROUS SULFATE DRIED ER 160 (50 FE) MG PO TBCR: 1.0000 | EXTENDED_RELEASE_TABLET | Freq: Every day | ORAL | 3 refills | Status: AC

## 2017-10-23 MED ORDER — CHLORHEXIDINE GLUCONATE CLOTH 2 % EX PADS: 6.0000 | MEDICATED_PAD | Freq: Once | CUTANEOUS | Status: DC

## 2017-10-23 MED ORDER — CETIRIZINE HCL 5 MG PO TABS: 5.0000 mg | ORAL_TABLET | Freq: Every day | ORAL | 1 refills | Status: DC

## 2017-10-23 MED ORDER — CLOPIDOGREL BISULFATE 75 MG PO TABS: 75.0000 mg | ORAL_TABLET | Freq: Every day | ORAL | 3 refills | Status: DC

## 2017-10-23 NOTE — Pre-Procedure Instructions (Signed)
    Gabrielle [name]  10/23/2017      [name] [place] [address] Phone: [phone] Fax: [phone]    Your procedure is scheduled on 10/26/17.  Report to [facility] Admitting at 730 A.M.  Call this number if you have problems the morning of surgery:  [phone]   Remember:  Do not eat food or drink liquids after midnight.  Take these medicines the morning of surgery with A SIP OF WATER ---norvasc,zyrtec,hydrocodone   Do not wear jewelry, make-up or nail polish.  Do not wear lotions, powders, or perfumes, or deoderant.  Do not shave 48 hours prior to surgery.  Men may shave face and neck.  Do not bring valuables to the hospital.  [facility] is not responsible for any belongings or valuables.  Contacts, dentures or bridgework may not be worn into surgery.  Leave your suitcase in the car.  After surgery it may be brought to your room.  For patients admitted to the hospital, discharge time will be determined by your treatment team.  Patients discharged the day of surgery will not be allowed to drive home.   Name and phone number of your driver:   Do not take any aspirin,anti-inflammatories,vitamins,or herbal supplements 5-7 days prior to surgery. Special instructions:   Please read over the following fact sheets that you were given.

## 2017-10-23 NOTE — Progress Notes (Signed)
HPI        ROS  Physical Exam  .

## 2017-10-23 NOTE — Patient Instructions (Addendum)
Please take a daily iron called Slow Fe for anemia   IF you received an x-ray today, you will receive an invoice from [facility]. Please contact [facility] at [phone] with questions or concerns regarding your invoice.   IF you received labwork today, you will receive an invoice from [facility]. Please contact [facility] at [phone] with questions or concerns regarding your invoice.   Our billing staff will not be able to assist you with questions regarding bills from these companies.  You will be contacted with the lab results as soon as they are available. The fastest way to get your results is to activate your My Chart account. Instructions are located on the last page of this paperwork. If you have not heard from [place] regarding the results in 2 weeks, please contact this office.     Complicated Grieving Grief is a normal response to the death of someone close to you. Feelings of fear, anger, and guilt can affect almost everyone who loses a loved one. It is also common to have symptoms of depression while you are grieving. These include problems with sleep, loss of appetite, and lack of energy. They may last for weeks or months after a loss. Complicated grief is different from normal grief or depression. Normal grieving involves sadness and feelings of loss, but these feelings are not constant. Complicated grief is a constant and severe type of grief. It interferes with your ability to function normally. It may last for several months to a year or longer. Complicated grief may require treatment from a mental health care provider. What are the causes? It is not known why some people continue to struggle with grief and others do not. You may be at higher risk for complicated grief if:  The death of your loved one was sudden or unexpected.  The death of your loved one was due to a violent event.  Your loved one committed suicide.  Your loved one was a child or a young  person.  You were very close to or dependent on the loved one.  You have a history of depression.  What are the signs or symptoms? Signs and symptoms of complicated grief may include:  Feeling disbelief or numbness.  Being unable to enjoy good memories of your loved one.  Needing to avoid anything that reminds you of your loved one.  Being unable to stop thinking about the death.  Feeling intense anger or guilt.  Feeling alone and hopeless.  Feeling that your life is meaningless and empty.  Losing the desire to live.  How is this diagnosed? Your health care provider may diagnose complicated grief if:  You have constant symptoms of grief for 6-12 months or longer.  Your symptoms are interfering with your ability to live your life.  Your health care provider may want you to see a mental health care provider. Many symptoms of depression are similar to the symptoms of complicated grief. It is important to be evaluated for complicated grief along with other mental health conditions. How is this treated? Talk therapy with a mental health provider is the most common treatment for complicated grief. During therapy, you will learn healthy ways to cope with the loss of your loved one. In some cases, your mental health care provider may also recommend antidepressant medicines. Follow these instructions at home:  Take care of yourself. ? Eat regular meals and maintain a healthy diet. Eat plenty of fruits, vegetables, and whole grains. ? Try to get  some exercise each day. ? Keep regular hours for sleep. Try to get at least 8 hours of sleep each night.  Do not use drugs or alcohol to ease your symptoms.  Take medicines only as directed by your health care provider.  Spend time with friends and loved ones.  Consider joining a grief (bereavement) support group to help you deal with your loss.  Keep all follow-up visits as directed by your health care provider. This is  important. Contact a health care provider if:  Your symptoms keep you from functioning normally.  Your symptoms do not get better with treatment. Get help right away if:  You have serious thoughts of hurting yourself or someone else.  You have suicidal feelings. This information is not intended to replace advice given to you by your health care provider. Make sure you discuss any questions you have with your health care provider. Document Released: 12/12/2005 Document Revised: 05/19/2016 Document Reviewed: 05/22/2014 Elsevier Interactive Patient Education  [employer].

## 2017-10-23 NOTE — Progress Notes (Signed)
TRANSITION OF CARE VISIT   Primary Care Physician (PCP): Forrest Moron, MD                                                    [address] [place] [address]    Date of Admission: 10/13/2017  Date of Discharge: 10/15/2017  Discharged from: [facility]  Discharge Diagnosis: syncope  Summary of Admission:  Pt was admitted after passing out in a store. She was hypotensive on admission but did not appear postictal  MRI brain was negative. Telemetry did not show arrhythmia. Echo did not show valve disease with normal LVEF.  Neurology recommended EEG.   She was noted to also have grieving due to the death of her son and father.    TODAY's VISIT  Patient/Caregiver self-reported problems/concerns: She reports that she does not have much appetite and does not feel like eating.  She admits that leading up to the even of the collapse at work she was not eating.  She is grieving but does not feel like she needs to talk to anybody. She is staying with her daughter in [place].   She is going to the pharmacy today to pick up her medications. She takes amlodipine 5mg  and plavix daily as well as aspirin.     Past Medical History:  Diagnosis Date  . Anxiety   . Arthritis   . Azotemia   . Carotid artery disease ([place])   . Claudication ([place])   . CVA (cerebral vascular accident) ([place])    [place] DIZZINESS 2008  . Depression   . Diabetes mellitus    1990  . Essential hypertension, benign   . History of anemia   . History of gout   . History of kidney stones   . Hyperlipidemia   . Neck pain   . PAD (peripheral artery disease) ([facility])    hx. LCEA, hx Lt renal art. stenting, occl rt renal artery, occluded bil SFAs, moderatie iliac disease,    . Renal cell cancer ([place]) 2001  . Seizure ([place])    Allergies  Allergen Reactions  . Codeine Nausea And Vomiting  . Coreg [Carvedilol] Nausea And Vomiting  . Fentanyl   .  Lacosamide      Other name is, VIMPAT  . Levetiracetam Other (See Comments)    Strange feelings in head  . Metformin And Related   . Metoprolol   . Morphine And Related Itching  . Oxybutynin Itching  . Sertraline Nausea Only and Other (See Comments)    Swelling in mouth  . Tessalon [Benzonatate]   . Other Rash    BETA BLOCKER   Past Surgical History:  Procedure Laterality Date  . CAROTID ENDARTERECTOMY     LEFT  . NEPHRECTOMY     RIGHT  . NM MYOCAR PERF WALL MOTION  07/12/2011   normal  . PV angiogram  2004   Lt renal artery stent  . TONSILLECTOMY    . URETERAL STENT PLACEMENT     LEFT  MEDICATIONS Current Outpatient Prescriptions  Medication Sig Dispense Refill  . amLODipine (NORVASC) 5 MG tablet Take 1 tablet (5 mg total) by mouth daily. 90 tablet 3  . aspirin 81 MG tablet Take 81 mg by mouth daily.    . clopidogrel (PLAVIX) 75 MG tablet Take 1 tablet (75 mg total) by mouth daily. 90 tablet 3  . HYDROcodone-acetaminophen (NORCO) 5-325 MG tablet Take 1 tablet by mouth every 6 (six) hours as needed for moderate pain. 15 tablet 0  . zolpidem (AMBIEN CR) 6.25 MG CR tablet Take 1 tablet (6.25 mg total) by mouth at bedtime as needed for sleep. 14 tablet 0  . ferrous sulfate (SLOW FE) 160 (50 Fe) MG TBCR SR tablet Take 1 tablet (160 mg total) by mouth daily. 30 each 3   No current facility-administered medications for this visit.    Facility-Administered Medications Ordered in Other Visits  Medication Dose Route Frequency Provider Last Rate Last Dose  . Chlorhexidine Gluconate Cloth 2 % PADS 6 each  6 each Topical Once Georganna Skeans, MD       And  . Chlorhexidine Gluconate Cloth 2 % PADS 6 each  6 each Topical Once Georganna Skeans, MD        Medication Reconciliation conducted with patient/caregiver? (Yes/ No):YES  New medications prescribed/discontinued upon discharge? (Yes/No): Ultram changed to Norco  Barriers identified related to medications: none  LABS  Lab  Reviewed (Yes/No/NA): yes  Component     Latest Ref Rng & Units 10/13/2017  WBC     4.0 - 10.5 K/uL 8.5  RBC     3.87 - 5.11 MIL/uL 3.93  Hemoglobin     12.0 - 15.0 g/dL 9.7 (L)  HCT     36.0 - 46.0 % 30.9 (L)  MCV     78.0 - 100.0 fL 78.6  MCH     26.0 - 34.0 pg 24.7 (L)  MCHC     30.0 - 36.0 g/dL 31.4  RDW     11.5 - 15.5 % 16.6 (H)  Platelets     150 - 400 K/uL 487 (H)   Component     Latest Ref Rng & Units 10/13/2017  Sodium     135 - 145 mmol/L 141  Potassium     3.5 - 5.1 mmol/L 4.3  Chloride     101 - 111 mmol/L 109  CO2     22 - 32 mmol/L 25  Glucose     65 - 99 mg/dL 80  BUN     6 - 20 mg/dL 24 (H)  Creatinine     0.44 - 1.00 mg/dL 1.76 (H)  Calcium     8.9 - 10.3 mg/dL 9.1  GFR, Est Non African American     >60 mL/min 26 (L)  GFR, Est African American     >60 mL/min 31 (L)  Anion gap     5 - 15 7    PHYSICAL EXAM:  Vitals:   10/23/17 1104  BP: (!) 138/52  Pulse: (!) 105  Resp: 16  Temp: 98.3 F (36.8 C)  TempSrc: Oral  SpO2: 98%  Weight: 127 lb 6.4 oz (57.8 kg)  Height: 5\' 1"  (1.549 m)   Physical Exam  Constitutional: She is oriented to person, place, and time. She appears well-developed and well-nourished.  HENT:  Head: Normocephalic and atraumatic.  Eyes: Conjunctivae and EOM are normal.  Cardiovascular: Normal rate, regular rhythm and normal heart sounds.   Pulmonary/Chest: Effort normal  and breath sounds normal. No respiratory distress. She has no wheezes.  Musculoskeletal: Normal range of motion. She exhibits no edema.  Neurological: She is alert and oriented to person, place, and time.  Skin: Capillary refill takes less than 2 seconds.  Psychiatric: She has a normal mood and affect. Her behavior is normal. Judgment and thought content normal.     Lab Results  Component Value Date   WBC 8.1 10/23/2017   HGB 9.9 (L) 10/23/2017   HCT 32.2 (L) 10/23/2017   MCV 78.9 10/23/2017   PLT 459 (H) 10/23/2017   Component      Latest Ref Rng & Units 10/23/2017  Sodium     135 - 145 mmol/L 138  Potassium     3.5 - 5.1 mmol/L 4.9  Chloride     101 - 111 mmol/L 104  CO2     22 - 32 mmol/L 24  Glucose     65 - 99 mg/dL 106 (H)  BUN     6 - 20 mg/dL 16  Creatinine     0.44 - 1.00 mg/dL 1.82 (H)  Calcium     8.9 - 10.3 mg/dL 9.1  GFR, Est Non African American     >60 mL/min 25 (L)  GFR, Est African American     >60 mL/min 29 (L)  Anion gap     5 - 15 10    ASSESSMENT:  Georgiann was seen today for hospital f/u.  Diagnoses and all orders for this visit:  Hospital discharge follow-up- reviewed hospital course and advised eating small frequent meals and drinking plenty of water to avoid dehydration She should skip amlodipine if she skips her meals -     POCT urinalysis dipstick -     Cancel: POCT CBC -     Cancel: Basic metabolic panel  Microcytic anemia-  Recommended daily iron in the form of Slow Fe -     Cancel: POCT CBC  Traumatic hematoma of right lower leg, sequela - has appt Thursday for surgery  Syncope and collapse- EEG ordered as outpatient MRI brain neg -     POCT urinalysis dipstick -     Cancel: Basic metabolic panel -     EEG; Future  Grieving- advised follow up with hospice for grief counseling Pt declined referral at this time Refilled ambien to help with sleep  CKD stage 3 due to type 2 diabetes mellitus ([place])- stable Pt had labs drawn this morning for preop testing -     Cancel: Basic metabolic panel  Other orders -     Cancel: Microalbumin, urine -     zolpidem (AMBIEN CR) 6.25 MG CR tablet; Take 1 tablet (6.25 mg total) by mouth at bedtime as needed for sleep. -     Discontinue: cetirizine (ZYRTEC) 5 MG tablet; Take 1 tablet (5 mg total) by mouth daily. -     ferrous sulfate (SLOW FE) 160 (50 Fe) MG TBCR SR tablet; Take 1 tablet (160 mg total) by mouth daily.      PATIENT EDUCATION PROVIDED: See AVS   FOLLOW-UP (Include any further testing or referrals):    Referrals for Psychology for counseling This will be evacuated by I&D November 1 for RLE hematoma Patient's husband was in hospice. Advised family to contact hospice to see if they can come to the home or tell them how to joint a support group. One month follow up for bp

## 2017-10-26 ENCOUNTER — Ambulatory Visit (HOSPITAL_COMMUNITY): Payer: Medicare Other | Admitting: Certified Registered Nurse Anesthetist

## 2017-10-26 ENCOUNTER — Encounter (HOSPITAL_COMMUNITY): Payer: Self-pay

## 2017-10-26 ENCOUNTER — Ambulatory Visit (HOSPITAL_BASED_OUTPATIENT_CLINIC_OR_DEPARTMENT_OTHER)
Admission: RE | Admit: 2017-10-26 | Discharge: 2017-10-26 | Disposition: A | Discharge status: Home / Self Care | Payer: Medicare Other | Source: Ambulatory Visit | Attending: General Surgery | Admitting: General Surgery

## 2017-10-26 ENCOUNTER — Encounter (HOSPITAL_COMMUNITY)
Admission: RE | Disposition: A | Discharge status: Home / Self Care | Payer: Self-pay | Source: Ambulatory Visit | Attending: General Surgery

## 2017-10-26 DIAGNOSIS — W010XXD Fall on same level from slipping, tripping and stumbling without subsequent striking against object, subsequent encounter: Secondary | ICD-10-CM | POA: Insufficient documentation

## 2017-10-26 DIAGNOSIS — Z87891 Personal history of nicotine dependence: Secondary | ICD-10-CM | POA: Insufficient documentation

## 2017-10-26 DIAGNOSIS — I10 Essential (primary) hypertension: Secondary | ICD-10-CM | POA: Diagnosis not present

## 2017-10-26 DIAGNOSIS — Z79891 Long term (current) use of opiate analgesic: Secondary | ICD-10-CM | POA: Diagnosis not present

## 2017-10-26 DIAGNOSIS — E7800 Pure hypercholesterolemia, unspecified: Secondary | ICD-10-CM | POA: Insufficient documentation

## 2017-10-26 DIAGNOSIS — D509 Iron deficiency anemia, unspecified: Secondary | ICD-10-CM | POA: Diagnosis not present

## 2017-10-26 DIAGNOSIS — E119 Type 2 diabetes mellitus without complications: Secondary | ICD-10-CM | POA: Diagnosis not present

## 2017-10-26 DIAGNOSIS — G4730 Sleep apnea, unspecified: Secondary | ICD-10-CM | POA: Insufficient documentation

## 2017-10-26 DIAGNOSIS — Z7982 Long term (current) use of aspirin: Secondary | ICD-10-CM | POA: Diagnosis not present

## 2017-10-26 DIAGNOSIS — Z79899 Other long term (current) drug therapy: Secondary | ICD-10-CM | POA: Insufficient documentation

## 2017-10-26 DIAGNOSIS — S8011XD Contusion of right lower leg, subsequent encounter: Secondary | ICD-10-CM | POA: Insufficient documentation

## 2017-10-26 DIAGNOSIS — S8011XA Contusion of right lower leg, initial encounter: Secondary | ICD-10-CM | POA: Diagnosis not present

## 2017-10-26 HISTORY — PX: INCISION AND DRAINAGE ABSCESS: SHX5864

## 2017-10-26 LAB — GLUCOSE, CAPILLARY
Glucose-Capillary: 124 mg/dL — ABNORMAL HIGH (ref 65–99)
Glucose-Capillary: 127 mg/dL — ABNORMAL HIGH (ref 65–99)

## 2017-10-26 SURGERY — INCISION AND DRAINAGE, ABSCESS
Anesthesia: General | Laterality: Right

## 2017-10-26 MED ORDER — FENTANYL CITRATE (PF) 100 MCG/2ML IJ SOLN
25.0000 ug | INTRAMUSCULAR | Status: DC | PRN
  Administered 2017-10-26: 50 ug via INTRAVENOUS

## 2017-10-26 MED ORDER — ONDANSETRON HCL 4 MG/2ML IJ SOLN
INTRAMUSCULAR | Status: DC | PRN
  Administered 2017-10-26: 4 mg via INTRAVENOUS

## 2017-10-26 MED ORDER — TRAMADOL HCL 50 MG PO TABS
ORAL_TABLET | ORAL | Status: AC
  Filled 2017-10-26: qty 2

## 2017-10-26 MED ORDER — BUPIVACAINE-EPINEPHRINE (PF) 0.25% -1:200000 IJ SOLN
INTRAMUSCULAR | Status: AC
  Filled 2017-10-26: qty 30

## 2017-10-26 MED ORDER — FENTANYL CITRATE (PF) 100 MCG/2ML IJ SOLN
INTRAMUSCULAR | Status: DC | PRN
  Administered 2017-10-26: 25 ug via INTRAVENOUS

## 2017-10-26 MED ORDER — TRAMADOL HCL 50 MG PO TABS
100.0000 mg | ORAL_TABLET | Freq: Once | ORAL | Status: AC
  Administered 2017-10-26: 100 mg via ORAL

## 2017-10-26 MED ORDER — LIDOCAINE 2% (20 MG/ML) 5 ML SYRINGE
INTRAMUSCULAR | Status: AC
  Filled 2017-10-26: qty 5

## 2017-10-26 MED ORDER — FENTANYL CITRATE (PF) 100 MCG/2ML IJ SOLN
INTRAMUSCULAR | Status: AC
  Filled 2017-10-26: qty 2

## 2017-10-26 MED ORDER — TRAMADOL HCL 50 MG PO TABS: 50.0000 mg | ORAL_TABLET | Freq: Four times a day (QID) | ORAL | 0 refills | Status: DC | PRN

## 2017-10-26 MED ORDER — EPHEDRINE SULFATE 50 MG/ML IJ SOLN
INTRAMUSCULAR | Status: DC | PRN
  Administered 2017-10-26 (×2): 5 mg via INTRAVENOUS

## 2017-10-26 MED ORDER — LACTATED RINGERS IV SOLN
INTRAVENOUS | Status: DC
  Administered 2017-10-26: 08:00:00 via INTRAVENOUS

## 2017-10-26 MED ORDER — LIDOCAINE 2% (20 MG/ML) 5 ML SYRINGE
INTRAMUSCULAR | Status: DC | PRN
  Administered 2017-10-26: 60 mg via INTRAVENOUS

## 2017-10-26 MED ORDER — CEFAZOLIN SODIUM-DEXTROSE 2-4 GM/100ML-% IV SOLN
2.0000 g | INTRAVENOUS | Status: AC
  Administered 2017-10-26: 2 g via INTRAVENOUS
  Filled 2017-10-26: qty 100

## 2017-10-26 MED ORDER — PROPOFOL 10 MG/ML IV BOLUS
INTRAVENOUS | Status: AC
  Filled 2017-10-26: qty 20

## 2017-10-26 MED ORDER — PROMETHAZINE HCL 25 MG/ML IJ SOLN: 6.2500 mg | INTRAMUSCULAR | Status: DC | PRN

## 2017-10-26 MED ORDER — FENTANYL CITRATE (PF) 250 MCG/5ML IJ SOLN
INTRAMUSCULAR | Status: AC
  Filled 2017-10-26: qty 5

## 2017-10-26 MED ORDER — 0.9 % SODIUM CHLORIDE (POUR BTL) OPTIME
TOPICAL | Status: DC | PRN
  Administered 2017-10-26: 1000 mL

## 2017-10-26 MED ORDER — ONDANSETRON HCL 4 MG/2ML IJ SOLN
INTRAMUSCULAR | Status: AC
  Filled 2017-10-26: qty 2

## 2017-10-26 MED ORDER — PROPOFOL 10 MG/ML IV BOLUS
INTRAVENOUS | Status: DC | PRN
  Administered 2017-10-26: 150 mg via INTRAVENOUS

## 2017-10-26 MED ORDER — BUPIVACAINE-EPINEPHRINE 0.25% -1:200000 IJ SOLN
INTRAMUSCULAR | Status: DC | PRN
  Administered 2017-10-26: 20 mL

## 2017-10-26 SURGICAL SUPPLY — 33 items
ADH SKN CLS APL DERMABOND .7 (GAUZE/BANDAGES/DRESSINGS) ×1
BNDG COHESIVE 4X5 TAN STRL (GAUZE/BANDAGES/DRESSINGS) ×2 IMPLANT
BNDG GAUZE ELAST 4 BULKY (GAUZE/BANDAGES/DRESSINGS) IMPLANT
CANISTER SUCT 3000ML PPV (MISCELLANEOUS) ×2 IMPLANT
COVER SURGICAL LIGHT HANDLE (MISCELLANEOUS) ×2 IMPLANT
DERMABOND ADVANCED (GAUZE/BANDAGES/DRESSINGS) ×1
DERMABOND ADVANCED .7 DNX12 (GAUZE/BANDAGES/DRESSINGS) ×1 IMPLANT
DRAPE LAPAROSCOPIC ABDOMINAL (DRAPES) ×2 IMPLANT
DRAPE UTILITY XL STRL (DRAPES) ×2 IMPLANT
DRSG PAD ABDOMINAL 8X10 ST (GAUZE/BANDAGES/DRESSINGS) IMPLANT
ELECT CAUTERY BLADE 6.4 (BLADE) ×2 IMPLANT
ELECT REM PT RETURN 9FT ADLT (ELECTROSURGICAL) ×2
ELECTRODE REM PT RTRN 9FT ADLT (ELECTROSURGICAL) ×1 IMPLANT
GAUZE SPONGE 4X4 12PLY STRL (GAUZE/BANDAGES/DRESSINGS) ×2 IMPLANT
GLOVE BIO SURGEON STRL SZ8 (GLOVE) ×2 IMPLANT
GLOVE BIOGEL PI IND STRL 8 (GLOVE) ×1 IMPLANT
GLOVE BIOGEL PI INDICATOR 8 (GLOVE) ×1
GOWN STRL REUS W/ TWL LRG LVL3 (GOWN DISPOSABLE) ×1 IMPLANT
GOWN STRL REUS W/ TWL XL LVL3 (GOWN DISPOSABLE) ×1 IMPLANT
GOWN STRL REUS W/TWL LRG LVL3 (GOWN DISPOSABLE) ×2
GOWN STRL REUS W/TWL XL LVL3 (GOWN DISPOSABLE) ×2
KIT BASIN OR (CUSTOM PROCEDURE TRAY) ×2 IMPLANT
KIT ROOM TURNOVER OR (KITS) ×2 IMPLANT
NEEDLE HYPO 25GX1X1/2 BEV (NEEDLE) ×2 IMPLANT
NS IRRIG 1000ML POUR BTL (IV SOLUTION) ×2 IMPLANT
PACK GENERAL/GYN (CUSTOM PROCEDURE TRAY) ×2 IMPLANT
PAD ARMBOARD 7.5X6 YLW CONV (MISCELLANEOUS) ×2 IMPLANT
SUT VIC AB 4-0 PS2 18 (SUTURE) ×2 IMPLANT
SWAB COLLECTION DEVICE MRSA (MISCELLANEOUS) IMPLANT
SWAB CULTURE ESWAB REG 1ML (MISCELLANEOUS) IMPLANT
SYR CONTROL 10ML LL (SYRINGE) ×2 IMPLANT
TOWEL OR 17X24 6PK STRL BLUE (TOWEL DISPOSABLE) ×2 IMPLANT
TOWEL OR 17X26 10 PK STRL BLUE (TOWEL DISPOSABLE) ×2 IMPLANT

## 2017-10-26 NOTE — Transfer of Care (Signed)
Immediate Anesthesia Transfer of Care Note  Patient: Gabrielle [name]  Procedure(s) Performed: INCISION AND DRAINAGE HEMATOMA RIGHT SHIN (Right )  Patient Location: PACU  Anesthesia Type:General  Level of Consciousness: drowsy and patient cooperative  Airway & Oxygen Therapy: Patient Spontanous Breathing and Patient connected to face mask oxygen  Post-op Assessment: Report given to RN and Post -op Vital signs reviewed and stable  Post vital signs: Reviewed and stable  Last Vitals:  Vitals:   10/26/17 0810 10/26/17 1049  BP: (!) 150/39   Pulse: 65   Resp: 16   Temp: 36.7 C (P) 36.8 C  SpO2: 100%     Last Pain:  Vitals:   10/26/17 1049  TempSrc:   PainSc: (P) Asleep         Complications: No apparent anesthesia complications

## 2017-10-26 NOTE — Anesthesia Procedure Notes (Signed)
Procedure Name: LMA Insertion Date/Time: 10/26/2017 10:12 AM Performed by: Suzette Battiest Pre-anesthesia Checklist: Patient identified, Emergency Drugs available, Suction available, Patient being monitored and Timeout performed Patient Re-evaluated:Patient Re-evaluated prior to induction Oxygen Delivery Method: Circle system utilized Preoxygenation: Pre-oxygenation with 100% oxygen Induction Type: IV induction LMA: LMA inserted LMA Size: 3.0 Number of attempts: 1 Placement Confirmation: ETT inserted through vocal cords under direct vision,  positive ETCO2,  CO2 detector and breath sounds checked- equal and bilateral Dental Injury: Teeth and Oropharynx as per pre-operative assessment

## 2017-10-26 NOTE — Interval H&P Note (Signed)
History and Physical Interval Note:  10/26/2017 10:00 AM  Gabrielle [name]  has presented today for surgery, with the diagnosis of HEMATOMA RIGHT SHIN  The various methods of treatment have been discussed with the patient and family. After consideration of risks, benefits and other options for treatment, the patient has consented to  Procedure(s): INCISION AND DRAINAGE HEMATOMA RIGHT SHIN (Right) as a surgical intervention .  The patient's history has been reviewed, patient examined, no change in status, stable for surgery.  I have reviewed the patient's chart and labs.  Questions were answered to the patient's satisfaction.     Issac Moure E

## 2017-10-26 NOTE — Progress Notes (Signed)
50 mcg of Fentanyl wasted in sink witnessed by Mertha Finders

## 2017-10-26 NOTE — H&P (Signed)
Gabrielle [name] 10/18/2017 10:40 AM Location: Gabrielle [name] Patient #: [id number] DOB: [birth year] Single / Language: Gabrielle [name] / Race: White Female   History of Present Illness Gabrielle Neri E. Grandville Silos MD; 10/18/2017 11:10 AM) The patient is a 82 year old female who presents for wound check. I was asked to see Gabrielle [name] in consultation by Dr. Delia Chimes regarding a large hematoma on her right shin. She fell approximately one month ago while going around to the garbage and she tripped over a stick. She had x-rays done which revealed no fractures but she has had an enlarging hematoma on her upper right medial shin. She has some associated sensory numbness distal to the hematoma. She takes Plavix daily. She was recently hospitalized after a questionable syncope last week. She does have some carotid stenosis which is not critical. She takes Plavix for coronary artery disease and remote CVA. She does not remember knowing that she had a stroke at any time.   Past Surgical History Gabrielle [name], [place]; 10/18/2017 10:40 AM) Hysterectomy (not due to cancer) - Complete  Nephrectomy  Right. Tonsillectomy   Diagnostic Studies History Gabrielle [name], [place]; 10/18/2017 10:40 AM) Colonoscopy  5-10 years ago Pap Smear  >5 years ago  Allergies Gabrielle [name], [place]; 10/18/2017 10:42 AM) No Known Drug Allergies 10/18/2017 Allergies Reconciled   Medication History Gabrielle [name], RMA; 10/18/2017 10:43 AM) Hydrocodone-Acetaminophen (5-325MG  Tablet, Oral) Active. Temazepam (15MG  Capsule, Oral) Active. Zolpidem Tartrate ER (12.5MG  Tablet ER, Oral) Active. AmLODIPine Besylate (5MG  Tablet, Oral) Active. Cephalexin (500MG  Capsule, Oral) Active. Ketoconazole (2% Cream, External) Active. SSD (1% Cream, External) Active. Medications Reconciled  Social History Gabrielle [name], [place]; 10/18/2017 10:40 AM) Caffeine use  Carbonated beverages, Coffee, Tea. No alcohol use  No drug use   Tobacco use  Former smoker.  Family History Gabrielle [name], [place]; 10/18/2017 10:40 AM) Colon Polyps  Daughter. Heart Disease  Daughter. Hypertension  Daughter.  Pregnancy / Birth History Gabrielle [name], [place]; 10/18/2017 10:40 AM) Age of menopause  16-50 Gravida  3 Maternal age  89-25 Para  3  Other Problems Gabrielle [name], [place]; 10/18/2017 10:40 AM) Back Pain  Bladder Problems  High blood pressure  Hypercholesterolemia  Kidney Stone  Oophorectomy  Bilateral. Sleep Apnea     Review of Systems Gabrielle [name] RMA; 10/18/2017 10:40 AM) General Not Present- Appetite Loss, Chills, Fatigue, Fever, Night Sweats, Weight Gain and Weight Loss. Skin Not Present- Change in Wart/Mole, Dryness, Hives, Jaundice, New Lesions, Non-Healing Wounds, Rash and Ulcer. HEENT Not Present- Earache, Hearing Loss, Hoarseness, Nose Bleed, Oral Ulcers, Ringing in the Ears, Seasonal Allergies, Sinus Pain, Sore Throat, Visual Disturbances, Wears glasses/contact lenses and Yellow Eyes. Respiratory Present- Snoring. Not Present- Bloody sputum, Chronic Cough, Difficulty Breathing and Wheezing. Cardiovascular Present- Leg Cramps. Not Present- Chest Pain, Difficulty Breathing Lying Down, Palpitations, Rapid Heart Rate, Shortness of Breath and Swelling of Extremities. Female Genitourinary Present- Frequency and Nocturia. Not Present- Painful Urination, Pelvic Pain and Urgency. Musculoskeletal Present- Back Pain. Not Present- Joint Pain, Joint Stiffness, Muscle Pain, Muscle Weakness and Swelling of Extremities. Neurological Present- Decreased Memory, Fainting, Trouble walking and Weakness. Not Present- Headaches, Numbness, Seizures, Tingling and Tremor. Psychiatric Present- Anxiety and Depression. Not Present- Bipolar, Change in Sleep Pattern, Fearful and Frequent crying.  Vitals Gabrielle [name] RMA; 10/18/2017 10:42 AM) 10/18/2017 10:41 AM Weight: 125.8 lb Height: 61in Body Surface Area: 1.55 m  Body Mass Index: 23.77 kg/m  Temp.: 99.68F  Pulse: 117 (Regular)  BP: 150/80 (Sitting, Left Arm, Standard)  Physical Exam Gabrielle Neri E. Grandville Silos MD; 10/18/2017 11:12 AM) Integumentary Note: Warm and dry   Chest and Lung Exam Note: CTA B   Cardiovascular Cardiovascular examination reveals -on palpation PMI is normal in location and amplitude, no palpable S3 or S4. Normal cardiac borders., normal heart sounds, regular rate and rhythm with no murmurs, carotid auscultation reveals no bruits and normal pedal pulses bilaterally. Note: RRR   Abdomen Note: soft, NT, ND   Neurologic Note: Awake and alert, walks with assistance of cane, speech fluent, decreased light touch sensation right lower extremity distal to the knee   Musculoskeletal Note: 8 cm hematoma right upper anterior shin, tenderness, no drainage, involving associated ecchymosis     Assessment & Plan Gabrielle Neri E. Grandville Silos MD; 10/18/2017 11:13 AM) LEG HEMATOMA, RIGHT, INITIAL ENCOUNTER (S80.11XA) Impression: Symptomatic large hematoma right shin. This is getting larger. I have offered incision and drainage of this hematoma as an outpatient surgical procedure. She will hold her Plavix for the next several days until [name] is scheduled. I discussed the procedure, risks, and benefits with her. I also spoke with her daughter. She is agreeable. I discussed the expected postoperative course.  10/26/17 re-examined. No changes. Site marked.  Gabrielle Skeans, MD, MPH, FACS Trauma: [phone] General [name]: [phone]

## 2017-10-26 NOTE — Op Note (Signed)
10/26/2017  10:50 AM  PATIENT:  Gabrielle [name]  82 y.o. female  PRE-OPERATIVE DIAGNOSIS:  HEMATOMA RIGHT SHIN  POST-OPERATIVE DIAGNOSIS:  HEMATOMA RIGHT SHIN  PROCEDURE:  Procedure(s): INCISION AND DRAINAGE HEMATOMA RIGHT SHIN  SURGEON:  Surgeon(s): Georganna Skeans, MD  ASSISTANTS: none   ANESTHESIA:   local and general  EBL:  Total I/O In: 700 [I.V.:700] Out: 5 [Blood:5]  BLOOD ADMINISTERED:none  DRAINS: none   SPECIMEN:  No Specimen  DISPOSITION OF SPECIMEN:  N/A  COUNTS:  YES  DICTATION: .Dragon Dictation Findings: Large hematoma, no evidence of infection or purulence.  Procedure in detail: Karyl presents for evacuation of hematoma right shin.  Her site was marked in the preop holding area.  Informed consent was obtained.  She received intravenous antibiotics.  She was brought to the operating room and general anesthesia with laryngeal mask airway was administered by the anesthesia staff.  Her leg was prepped and draped in sterile fashion.  We did a timeout procedure.  Local anesthetic was injected along the planned line of incision and then circumferentially around the hematoma for pain relief postoperatively.  Linear incision was made in a vertical fashion.  Subcutaneous tissues were dissected down and I entered the hematoma.  I  evacuated at least 50 cc of old blood and blood clots from the cavity space.  There was no purulence.  No evidence of infection.  The area was irrigated out liberally.  Hemostasis was ensured.  Skin was closed with running 4-0 Vicryl subcuticular followed by Dermabond.  A pressure dressing with Coban was applied.  All counts were correct.  She tolerated the procedure well without apparent complication and was taken recovery in stable condition. PATIENT DISPOSITION:  PACU - hemodynamically stable.   Delay start of Pharmacological VTE agent (>24hrs) due to surgical blood loss or risk of bleeding:  yes  Georganna Skeans, MD, MPH, FACS Pager:  [phone]  11/1/201810:50 AM

## 2017-10-26 NOTE — Anesthesia Postprocedure Evaluation (Signed)
Anesthesia Post Note  Patient: Gabrielle [name]  Procedure(s) Performed: INCISION AND DRAINAGE HEMATOMA RIGHT SHIN (Right )     Patient location during evaluation: PACU Anesthesia Type: General Level of consciousness: awake and alert Pain management: pain level controlled Vital Signs Assessment: post-procedure vital signs reviewed and stable Respiratory status: spontaneous breathing, nonlabored ventilation, respiratory function stable and patient connected to nasal cannula oxygen Cardiovascular status: blood pressure returned to baseline and stable Postop Assessment: no apparent nausea or vomiting Anesthetic complications: no    Last Vitals:  Vitals:   10/26/17 1132 10/26/17 1140  BP: (!) 147/68   Pulse: 75   Resp: (!) 23   Temp:  36.5 C  SpO2: 98%     Last Pain:  Vitals:   10/26/17 1130  TempSrc:   PainSc: 5                  Tiajuana Amass

## 2017-10-26 NOTE — Anesthesia Preprocedure Evaluation (Addendum)
Anesthesia Evaluation  Patient identified by MRN, date of birth, ID band Patient awake    Reviewed: Allergy & Precautions, NPO status , Patient's Chart, lab work & pertinent test results  Airway Mallampati: II  TM Distance: >3 FB Neck ROM: Full    Dental  (+) Dental Advisory Given   Pulmonary former smoker,    breath sounds clear to auscultation       Cardiovascular hypertension, Pt. on medications + Peripheral Vascular Disease   Rhythm:Regular Rate:Normal  10/15/17: Vigorous LV systolic function; mild diastolic dysfunction;  elevated LV filling pressure; trace AI; mild LAE.   Neuro/Psych Anxiety Depression CVA    GI/Hepatic negative GI ROS, Neg liver ROS,   Endo/Other  diabetes, Type 2  Renal/GU Renal disease     Musculoskeletal   Abdominal   Peds  Hematology  (+) anemia ,   Anesthesia Other Findings   Reproductive/Obstetrics                            Lab Results  Component Value Date   WBC 8.1 10/23/2017   HGB 9.9 (L) 10/23/2017   HCT 32.2 (L) 10/23/2017   MCV 78.9 10/23/2017   PLT 459 (H) 10/23/2017   Lab Results  Component Value Date   CREATININE 1.82 (H) 10/23/2017   BUN 16 10/23/2017   NA 138 10/23/2017   K 4.9 10/23/2017   CL 104 10/23/2017   CO2 24 10/23/2017    Anesthesia Physical Anesthesia Plan  ASA: III  Anesthesia Plan: General   Post-op Pain Management:    Induction: Intravenous  PONV Risk Score and Plan: 3 and Ondansetron, Dexamethasone and Treatment may vary due to age or medical condition  Airway Management Planned: LMA  Additional Equipment: None  Intra-op Plan:   Post-operative Plan: Extubation in OR  Informed Consent: I have reviewed the patients History and Physical, chart, labs and discussed the procedure including the risks, benefits and alternatives for the proposed anesthesia with the patient or authorized representative who has indicated  his/her understanding and acceptance.   Dental advisory given  Plan Discussed with: CRNA  Anesthesia Plan Comments:         Anesthesia Quick Evaluation

## 2017-10-27 ENCOUNTER — Encounter (HOSPITAL_COMMUNITY): Payer: Self-pay | Admitting: General Surgery

## 2017-10-28 ENCOUNTER — Telehealth: Payer: Self-pay | Admitting: Family Medicine

## 2017-10-28 NOTE — Telephone Encounter (Signed)
Pt is very anxious to get her glipizide  She states that she is staying with family that is in [place] and they have to drive back and forth since she cant drive to get this so please call her once been reviewed and filled so they dont have to go back and forth  It also does not look like we were the one to prescribe the glipizide    Best number [phone]

## 2017-10-30 NOTE — Telephone Encounter (Signed)
Please advise 

## 2017-11-10 NOTE — Telephone Encounter (Signed)
Pt calling requesting refills of Lisinopril and Glipizide. Medications not showing on current medication list. Pt had seen Dr. [name] previously and the medications were written in his notes as medications that the pt was taking. Pt had her last refill of these two medications in 06/2017.  Pt states she has been taking these medications for approximately 15 years and usually gets 90tab refills.

## 2017-11-13 ENCOUNTER — Ambulatory Visit (INDEPENDENT_AMBULATORY_CARE_PROVIDER_SITE_OTHER): Payer: Medicare Other | Admitting: Neurology

## 2017-11-13 DIAGNOSIS — R55 Syncope and collapse: Secondary | ICD-10-CM

## 2017-11-13 NOTE — Telephone Encounter (Signed)
Dr. Nolon Rod   It looks like these were cancelled per note because patient reported not taking?   Please Advise

## 2017-11-14 NOTE — Procedures (Signed)
ELECTROENCEPHALOGRAM REPORT  Date of Study: 11/13/2017  Patient's Name: Gabrielle [name] MRN: [id number] Date of Birth: Nov 08, 1935  Referring Provider: Dr. Delia Chimes  Clinical History: This is a 82 year old woman with syncope.  Medications: NORVASC 5 MG tablet aspirin 81 MG tablet PLAVIX 75 MG tablet NORCO 5-325 MG tablet AMBIEN CR 6.25 MG CR tablet  SLOW FE 160 (50 Fe) MG TBCR SR tablet   Technical Summary: A multichannel digital EEG recording measured by the international 10-20 system with electrodes applied with paste and impedances below 5000 ohms performed in our laboratory with EKG monitoring in an awake and asleep patient.  Hyperventilation was not performed. Photic stimulation was performed.  The digital EEG was referentially recorded, reformatted, and digitally filtered in a variety of bipolar and referential montages for optimal display.    Description: The patient is awake and asleep during the recording.  During maximal wakefulness, there is a symmetric, medium voltage 10 Hz posterior dominant rhythm that attenuates with eye opening.  The record is symmetric.  During drowsiness and sleep, there is an increase in theta slowing of the background.  Vertex waves and symmetric sleep spindles were seen.  Photic stimulation did not elicit any abnormalities.  There were no epileptiform discharges or electrographic seizures seen.    EKG lead showed sinus bradycardia at 60 bpm.  Impression: This awake and asleep EEG is normal.    Clinical Correlation: A normal EEG does not exclude a clinical diagnosis of epilepsy.  Clinical correlation is advised.   Ellouise Newer, M.D.

## 2017-11-15 ENCOUNTER — Telehealth: Payer: Self-pay | Admitting: Family Medicine

## 2017-11-15 ENCOUNTER — Telehealth (HOSPITAL_COMMUNITY): Payer: Self-pay

## 2017-11-15 NOTE — Telephone Encounter (Signed)
Pt is requesting her b/p meds and diabetes med that was discontinued in October. She states her b/p was 194/92 at [facility] today. She is very upset. She is going to [facility] today and requests that her meds be sent to her at the [place] there. The meds are Lisinopril and Glipzide. She wants someone to call her today.

## 2017-11-15 NOTE — Telephone Encounter (Signed)
Copied from [place] [phone]. Topic: Inquiry >> Nov 15, 2017 12:57 PM Malena Catholic I, NT wrote: Reason for CRM: Pt call call state She need her BP med because  Her BP is 194/90 she takes Lisinopril and Blood sugar med Glipizide the pharmacy no not want to refill it because the Doctor need to sign for.Thanks

## 2017-11-17 NOTE — Telephone Encounter (Signed)
Please advise 

## 2017-11-17 NOTE — Telephone Encounter (Signed)
Copied from [place] [phone]. Topic: General - Other >> Nov 17, 2017  8:35 AM Carolyn Stare wrote: Reason for CRM   [phone]   Pt is very upset that no one has contacted her about her lisipril 40mg  take 1  and glipizide  once daily  40 mg take 1 tablet daily Dr Osvaldo Angst  pt is very upset

## 2017-11-20 NOTE — Telephone Encounter (Signed)
Lab Results  Component Value Date   HGBA1C 6.2 (H) 09/25/2017   BP Readings from Last 3 Encounters:  10/26/17 (!) 147/68  10/23/17 (!) 138/55  10/23/17 (!) 138/52    She is not to continue the glipizide. She is now diet controlled.  Discussed with the patient that since her hospitalization for syncope she does not need as much medication.  Given her recent fall over correction poses a higher risk. The patient and her daughter understood this explanation.

## 2017-11-21 ENCOUNTER — Telehealth (HOSPITAL_COMMUNITY): Payer: Self-pay

## 2017-11-21 ENCOUNTER — Telehealth: Payer: Self-pay | Admitting: Family Medicine

## 2017-11-21 NOTE — Telephone Encounter (Signed)
Pt is checking on status of Lisinopril and Glipizide.

## 2017-11-21 NOTE — Telephone Encounter (Signed)
Copied from [place]. Topic: Quick Communication - See Telephone Encounter >> Nov 21, 2017 10:29 AM Hewitt Shorts wrote: CRM for notification. See Telephone encounter for: pt is wanting to check on the status of her request of her lisnipril and glipizide she also wants to add the request of slow iron pill, she states that now she stays in [place] with her daughter since her husband has passed away and she would like to have this done today before 130-200   11/21/17.

## 2017-11-21 NOTE — Telephone Encounter (Signed)
Copied from [place] [phone]. Topic: General - Other >> Nov 21, 2017  5:12 PM Cecelia Byars, NT wrote: Reason for CRM: Patient would  like to check on refill request  for glipizide and lisinopril  and slowironironcr  generic for ferroussulfatedridtaber  please call her at [phone] would like a call today she says she needs her medicien

## 2017-11-21 NOTE — Telephone Encounter (Signed)
Patient is calling for a refill on her Ambien, she recently lost her husband and has not been in to see you since April. She has an appointment for Friday, however she is out of the Ambien and not sure yet if her daughter can bring her to the appointment on Friday.

## 2017-11-22 ENCOUNTER — Telehealth: Payer: Self-pay | Admitting: Family Medicine

## 2017-11-22 ENCOUNTER — Other Ambulatory Visit: Payer: Self-pay

## 2017-11-22 ENCOUNTER — Encounter: Payer: Medicare Other | Admitting: Family Medicine

## 2017-11-22 MED ORDER — GLIPIZIDE 5 MG PO TABS: 2.5000 mg | ORAL_TABLET | Freq: Two times a day (BID) | ORAL | 0 refills | Status: DC

## 2017-11-22 MED ORDER — LISINOPRIL 40 MG PO TABS: 40.0000 mg | ORAL_TABLET | Freq: Every day | ORAL | 0 refills | Status: DC

## 2017-11-22 NOTE — Telephone Encounter (Signed)
Left detailed VM. Advised pt that lisinopril and glipizide are not listed under their current medications. Pt has OV scheduled with Carlota Raspberry on 12/3.

## 2017-11-22 NOTE — Telephone Encounter (Signed)
Spoke with pt.  She was adamant the lisinopril and glipizide be refilled until she can see Dr. Carlota Raspberry on Monday.     Discussed with Dr. Linna Darner.  Called [facility]-pt last got refill of glipizide 09/19/2017 and last refil of lisiniopril 7/25 #90.  Dr. Linna Darner approved 10 days of meds refill.  Sent electronically to [employer].   IC pt to advise-LMOVM

## 2017-11-22 NOTE — Telephone Encounter (Signed)
Copied from [place]. Topic: Quick Communication - See Telephone Encounter >> Nov 22, 2017 11:57 AM Conception Chancy, NT wrote: CRM for notification. See Telephone encounter for:  11/22/17.  Pt is calling for the 5th time since Friday 11/23 needing these medications refilled. She states she needs them sent to the pharmacy [facility] in [place], her BP and sugar is getting high since she is not taking medication. Pt would like a call back when her medication is sent over. Thank you. [phone]

## 2017-11-22 NOTE — Telephone Encounter (Signed)
Spoke with dtr.  Pt has refills on Iron. Pt does not have Lisinopril nor Glipizide on her MAR Dtr states her husband checks pt's glucose with finger sticks and sugar is 200 & BP is 200/90.  She wants to be put back on meds listed above.  Last refill Glipizide 02/2016 and last refill on Lisinopril 08/2016.  Pt needs to be seen.  Has appt 12/3 with Carlota Raspberry.    Dtr asked if "Memory" test could be done.  Pt confused, will not allow supervision of meds - ? Taking BP med.  No dietary control for sugar.   Advised to call if pt needs to be seen sooner or go to [facility] or ED

## 2017-11-24 ENCOUNTER — Ambulatory Visit (INDEPENDENT_AMBULATORY_CARE_PROVIDER_SITE_OTHER): Payer: Medicare Other | Admitting: Psychiatry

## 2017-11-24 ENCOUNTER — Encounter (HOSPITAL_COMMUNITY): Payer: Self-pay | Admitting: Psychiatry

## 2017-11-24 VITALS — BP 146/88 | HR 110 | Ht 61.0 in | Wt 123.0 lb

## 2017-11-24 DIAGNOSIS — F409 Phobic anxiety disorder, unspecified: Secondary | ICD-10-CM

## 2017-11-24 DIAGNOSIS — Z87891 Personal history of nicotine dependence: Secondary | ICD-10-CM | POA: Diagnosis not present

## 2017-11-24 DIAGNOSIS — F5105 Insomnia due to other mental disorder: Secondary | ICD-10-CM | POA: Diagnosis not present

## 2017-11-24 MED ORDER — ZOLPIDEM TARTRATE ER 12.5 MG PO TBCR: 12.5000 mg | EXTENDED_RELEASE_TABLET | Freq: Every evening | ORAL | 3 refills | Status: DC | PRN

## 2017-11-24 NOTE — Progress Notes (Signed)
Psychiatric Initial Adult Assessment   Patient Identification: Gabrielle [name] MRN:  [id number] Date of Evaluation:  11/24/2017 Referral Source: Dr. [name] Chief Complaint:   Visit Diagnosis: No diagnosis found.   Since her last visit this patient's husband died. He got somewhat suddenly. He was in a nursing home at the time. Unfortunately after he died a few days later the patient actually collapsed herself. She was admitted to the hospital and unfortunately could not get the funeral presently she is living with her family. She is somewhat depressed but she seems to be getting better. Her husband died about 6 weeks ago. The patient is sleeping fairly well as long she takes her Ambien 12.5 CR She's eating poorly. Her energy level is okay. She does describe problems concentrating.She seems her memory is not as good but I suspect she still very distressed.She's got good family support system and she is looking for to Christmas with all great-grandchildren. She started on the new primary care doctor Dr. Nyoka Cowden next few days. She describes a little bit of numbness around her mouth which I suspect is related to anxiety. Overall she's doing fairly well. She is acknowledging the death of her husband after 60 years is hard for her area she's not clear where she's going to. My consultation continue her Ambien 12.5 CR. Patient is not suicidal. (Hypo) Manic Symptoms:   Anxiety Symptoms:   Psychotic Symptoms:   PTSD Symptoms:   Past Psychiatric History: Negative  Previous Psychotropic Medications: Yes   Substance Abuse History in the last 12 months:  No.  Consequences of Substance Abuse: NA  Past Medical History:  Past Medical History:  Diagnosis Date  . Anxiety   . Arthritis   . Azotemia   . Carotid artery disease ([place])   . Claudication ([place])   . CVA (cerebral vascular accident) ([place])    [place] DIZZINESS 2008  . Depression   . Diabetes mellitus    1990  . Essential hypertension, benign   .  History of anemia   . History of gout   . History of kidney stones   . Hyperlipidemia   . Neck pain   . PAD (peripheral artery disease) ([facility])    hx. LCEA, hx Lt renal art. stenting, occl rt renal artery, occluded bil SFAs, moderatie iliac disease,    . Renal cell cancer ([place]) 2001  . Seizure [facility])     Past Surgical History:  Procedure Laterality Date  . CAROTID ENDARTERECTOMY     LEFT  . INCISION AND DRAINAGE ABSCESS Right 10/26/2017   Procedure: INCISION AND DRAINAGE HEMATOMA RIGHT SHIN;  Surgeon: Georganna Skeans, MD;  Location: [facility];  Service: General;  Laterality: Right;  . NEPHRECTOMY     RIGHT  . NM MYOCAR PERF WALL MOTION  07/12/2011   normal  . PV angiogram  2004   Lt renal artery stent  . TONSILLECTOMY    . URETERAL STENT PLACEMENT     LEFT    Family Psychiatric History:   Family History:  Family History  Problem Relation Age of Onset  . Cancer Mother   . Heart disease Father     Social History:   Social History   Socioeconomic History  . Marital status: Married    Spouse name: None  . Number of children: 2  . Years of education: None  . Highest education level: None  Social Needs  . Financial resource strain: None  . Food insecurity - worry: None  . Food  insecurity - inability: None  . Transportation needs - medical: None  . Transportation needs - non-medical: None  Occupational History  . Occupation: Retired    Fish farm manager: RETIRED  Tobacco Use  . Smoking status: Former Smoker    Last attempt to quit: 12/26/2005    Years since quitting: 11.9  . Smokeless tobacco: Never Used  Substance and Sexual Activity  . Alcohol use: No  . Drug use: No  . Sexual activity: No    Birth control/protection: Abstinence  Other Topics Concern  . None  Social History Narrative  . None    Additional Social History:   Allergies:   Allergies  Allergen Reactions  . Codeine Nausea And Vomiting  . Coreg [Carvedilol] Nausea And Vomiting  . Fentanyl   . Lacosamide       Other name is, VIMPAT  . Levetiracetam Other (See Comments)    Strange feelings in head  . Metformin And Related   . Metoprolol   . Morphine And Related Itching  . Oxybutynin Itching  . Sertraline Nausea Only and Other (See Comments)    Swelling in mouth  . Tessalon [Benzonatate]   . Other Rash    BETA BLOCKER    Metabolic Disorder Labs: Lab Results  Component Value Date   HGBA1C 6.2 (H) 09/25/2017   MPG 128 (H) 07/30/2010   No results found for: PROLACTIN Lab Results  Component Value Date   CHOL 186 09/25/2017   TRIG 124 09/25/2017   HDL 43 09/25/2017   CHOLHDL 4.3 09/25/2017   VLDL 25 05/03/2016   LDLCALC 118 (H) 09/25/2017   LDLCALC 59 05/03/2016     Current Medications: Current Outpatient Medications  Medication Sig Dispense Refill  . amLODipine (NORVASC) 5 MG tablet Take 1 tablet (5 mg total) by mouth daily. 90 tablet 3  . aspirin 81 MG tablet Take 81 mg by mouth daily.    . clopidogrel (PLAVIX) 75 MG tablet Take 1 tablet (75 mg total) by mouth daily. 90 tablet 3  . ferrous sulfate (SLOW FE) 160 (50 Fe) MG TBCR SR tablet Take 1 tablet (160 mg total) by mouth daily. 30 each 3  . glipiZIDE (GLUCOTROL) 5 MG tablet Take 0.5 tablets (2.5 mg total) by mouth 2 (two) times daily before a meal. 10 tablet 0  . lisinopril (PRINIVIL,ZESTRIL) 40 MG tablet Take 1 tablet (40 mg total) by mouth daily. 10 tablet 0  . traMADol (ULTRAM) 50 MG tablet Take 1-2 tablets (50-100 mg total) by mouth every 6 (six) hours as needed for moderate pain or severe pain. 20 tablet 0  . zolpidem (AMBIEN CR) 12.5 MG CR tablet Take 1 tablet (12.5 mg total) by mouth at bedtime as needed for sleep. 30 tablet 3  . HYDROcodone-acetaminophen (NORCO) 5-325 MG tablet Take 1 tablet by mouth every 6 (six) hours as needed for moderate pain. (Patient not taking: Reported on 11/24/2017) 15 tablet 0   No current facility-administered medications for this visit.     Neurologic: Headache: No Seizure:  No Paresthesias:NA  Musculoskeletal: Strength & Muscle Tone: Normal Gait & Station: Normal Patient leans: N/A  Psychiatric Specialty Exam: ROS  Blood pressure (!) 146/88, pulse (!) 110, height 5\' 1"  (1.549 m), weight 123 lb (55.8 kg).Body mass index is 23.24 kg/m.  General Appearance: Well Groomed  Eye Contact:  Good  Speech:  Clear and Coherent  Volume:  Normal  Mood:  Euthymic  Affect:  Appropriate  Thought Process:  Goal Directed  Orientation:  NA  Thought Content:  Logical  Suicidal Thoughts:  No  Homicidal Thoughts:  No  Memory:  NA  Judgement:  Good  Insight:  Good  Psychomotor Activity:  Normal  Concentration:  Concentration: Good  Recall:  Good  Fund of Knowledge:Good  Language: Negative  Akathisia:  No  Handed:  Right  AIMS (if indicated):    Assets:  Desire for Improvement  ADL's:  Intact  Cognition: WNL  Sleep:  Abnormal , problems maintaining     Treatment Plan Summary: Medication management 11/30/201810:31 AM  At this time the patient is doing fair. She is depressedabout 50% of the time which is expected. I think this is better. She does sleep well long she takes her medicine. She's got good support system. Today we talked about the process of grieving and how it'll take time and how come and go and how she should really respect the anniversary of their marriage his birthdayand they would die. The patient seems fairly well. I think she'll do better when she gets with her primary care doctor makes decisions where she wants to live. She'll return to see me in a few months she's not a lot better I will refer her to hospice for grieving therapy.

## 2017-11-27 ENCOUNTER — Ambulatory Visit: Payer: Self-pay | Admitting: Family Medicine

## 2017-11-27 ENCOUNTER — Ambulatory Visit (INDEPENDENT_AMBULATORY_CARE_PROVIDER_SITE_OTHER): Payer: Medicare Other | Admitting: Family Medicine

## 2017-11-27 ENCOUNTER — Encounter: Payer: Self-pay | Admitting: Family Medicine

## 2017-11-27 ENCOUNTER — Ambulatory Visit: Payer: Medicare Other | Admitting: Family Medicine

## 2017-11-27 ENCOUNTER — Other Ambulatory Visit: Payer: Self-pay

## 2017-11-27 VITALS — BP 124/62 | HR 100 | Temp 98.3°F | Resp 18 | Ht 61.0 in | Wt 124.0 lb

## 2017-11-27 DIAGNOSIS — I10 Essential (primary) hypertension: Secondary | ICD-10-CM

## 2017-11-27 DIAGNOSIS — D649 Anemia, unspecified: Secondary | ICD-10-CM

## 2017-11-27 DIAGNOSIS — E1122 Type 2 diabetes mellitus with diabetic chronic kidney disease: Secondary | ICD-10-CM

## 2017-11-27 DIAGNOSIS — R200 Anesthesia of skin: Secondary | ICD-10-CM | POA: Diagnosis not present

## 2017-11-27 DIAGNOSIS — N184 Chronic kidney disease, stage 4 (severe): Secondary | ICD-10-CM

## 2017-11-27 MED ORDER — GLIPIZIDE 5 MG PO TABS: 2.5000 mg | ORAL_TABLET | Freq: Every day | ORAL | 1 refills | Status: DC

## 2017-11-27 MED ORDER — LISINOPRIL 40 MG PO TABS: 40.0000 mg | ORAL_TABLET | Freq: Every day | ORAL | 0 refills | Status: DC

## 2017-11-27 NOTE — Patient Instructions (Addendum)
If you have not seen Dr. Posey Pronto (nephrologist) recently, would call and see if an appointment is needed. I will repeat that test today as it was increasing slightly when last checked.    I will check your anemia and electrolytes today, as well as B12 level for numbness feeling in lips.   No change in medications for now, but watch for any lightheadedness/dizziness with current medications.  If that occurs - be seen here or in emergency room right away.   Please follow up after January 1st to recheck diabetes.   Continue to follow up with your psychiatrist, including for any Ambien refills.   Return to the clinic or go to the nearest emergency room if any of your symptoms worsen or new symptoms occur.  Thank you for coming in today.

## 2017-11-27 NOTE — Progress Notes (Addendum)
Subjective:  By signing my name below, I, Gabrielle [name], attest that this documentation has been prepared under the direction and in the presence of Gabrielle Ray, MD. Electronically Signed: Moises [name], [place]. 11/27/2017 , 12:40 PM .  Patient was seen in Room 11 .   Patient ID: Gabrielle [name], female    DOB: 1935-05-13, 82 y.o.   MRN: [id number] Chief Complaint  Patient presents with  . Hypertension    follow up and med refill  . paperwork    handicap sticker paper    HPI Gabrielle [name] is a 82 y.o. female  Here for follow up of HTN and diabetes. PCP is Forrest Moron, MD. She is a new patient to me. She has a history of DM, HTN, hyperlipidemia, CVA, PAD, renal cell cancer, spinal stenosis, and CKD. It appears she was last seen on Oct 29th by Dr. Nolon Rod after hospital discharge. At that visit, she was taking Norvasc, Plavix, and aspirin. She had been admitted due to hypotension and syncope, had negative MRI of her brain, no arhythmia on telemetry, echo without significant valve disease and normal EF. She had a normal EEG for Nov 19th. Her caregiver at that visit had noted the patient had decreased appetite with some grieving due to death of her son and her father. She was recommended to start SLOW FE for anemia.   Lab Results  Component Value Date   WBC 8.1 10/23/2017   HGB 9.9 (L) 10/23/2017   HCT 32.2 (L) 10/23/2017   MCV 78.9 10/23/2017   PLT 459 (H) 10/23/2017   She was continued on ambien 6.25mg  to help with sleep. She declined referral to hospice for grief counseling. Recommended 1 month follow up for [name] pressure assessment. Her BP was 138/52 on Oct 29th. There was some concern about mental status, and screening was done today.   6CIT Screen 11/27/2017  What Year? 4 points  What month? 0 points  What time? 0 points  Count back from 20 0 points  Months in reverse 0 points  Repeat phrase 0 points  Total Score 4    HTN Dr. Nolon Rod had prescribed her 90  day supplies of her Norvasc with 3 refills. Patient denies missing any doses.   Lab Results  Component Value Date   CREATININE 1.82 (H) 10/23/2017   She was referred to nephrologist, Dr. Posey Pronto at [facility], in July 2017; however, she doesn't recall the last time she saw her nephrologist. She also takes Lisinopril 40mg  QD. She denies hypotensive episodes. She denies lightheadedness or dizziness. She denies chest pain, or shortness of breath.   Anemia She's been taking her iron supplement. She believes she had just started her iron supplement on last lab.   Lip numbness She mentions numbness towards the inside of her lower lip, initially started around hospitalization. She denies tongue numbness. She denies any oral rash or blisters around the area. She denies numbness or weakness in her extremities.   DM She states she's been taking glipizide half pill once a day; usually taken in the morning. She denies any [name] in her stool.   Her daughter mentions patient has been eating a lot of sugar-free popsicles and sugar-free candy.   Lab Results  Component Value Date   HGBA1C 6.2 (H) 09/25/2017   Insomnia Patient states she takes ambien 12.5mg  at night to sleep, prescribed by Dr. Casimiro Needle. She notes she's been on this dose for a while, "a lower dose and I won't  be able to sleep." She denies any dizzy spells, side effects or parasomnias. Her next appointment with Dr. Casimiro Needle will be in January 2019.   Handicap Parking Placard She requests handicap parking placard with history of spinal stenosis. She has difficulty walking long distances and utilizes a cane when outside.   Patient Active Problem List   Diagnosis Date Noted  . Syncope 10/14/2017  . CKD (chronic kidney disease), stage III ([place]) 10/14/2017  . Need for prophylactic vaccination and inoculation against influenza 09/20/2017  . Right leg pain 09/20/2017  . Contusion of right leg, initial encounter 09/20/2017  . Traumatic  hematoma of right lower leg 09/20/2017  . Abrasion 09/20/2017  . Iron deficiency anemia 07/08/2016  . Chronic insomnia 12/10/2015  . Spinal stenosis of lumbar region 05/21/2014  . Carotid artery disease ([place]) 02/26/2014  . PAD (peripheral artery disease) ([place])   . Renal cell cancer ([place])   . Diabetes mellitus   . Essential hypertension, benign   . Hyperlipidemia   . History of anemia   . CVA (cerebral vascular accident) [facility])    Past Medical History:  Diagnosis Date  . Anxiety   . Arthritis   . Azotemia   . Carotid artery disease ([place])   . Claudication ([place])   . CVA (cerebral vascular accident) ([place])    [place] DIZZINESS 2008  . Depression   . Diabetes mellitus    1990  . Essential hypertension, benign   . History of anemia   . History of gout   . History of kidney stones   . Hyperlipidemia   . Neck pain   . PAD (peripheral artery disease) (HCC)    hx. LCEA, hx Lt renal art. stenting, occl rt renal artery, occluded bil SFAs, moderatie iliac disease,    . Renal cell cancer ([place]) 2001  . Seizure [facility])    Past Surgical History:  Procedure Laterality Date  . CAROTID ENDARTERECTOMY     LEFT  . INCISION AND DRAINAGE ABSCESS Right 10/26/2017   Procedure: INCISION AND DRAINAGE HEMATOMA RIGHT SHIN;  Surgeon: Georganna Skeans, MD;  Location: [facility];  Service: General;  Laterality: Right;  . NEPHRECTOMY     RIGHT  . NM MYOCAR PERF WALL MOTION  07/12/2011   normal  . PV angiogram  2004   Lt renal artery stent  . TONSILLECTOMY    . URETERAL STENT PLACEMENT     LEFT   Allergies  Allergen Reactions  . Codeine Nausea And Vomiting  . Coreg [Carvedilol] Nausea And Vomiting  . Fentanyl   . Lacosamide      Other name is, VIMPAT  . Levetiracetam Other (See Comments)    Strange feelings in head  . Metformin And Related   . Metoprolol   . Morphine And Related Itching  . Oxybutynin Itching  . Sertraline Nausea Only and Other (See Comments)    Swelling in mouth  . Tessalon  [Benzonatate]   . Other Rash    BETA BLOCKER   Prior to Admission medications   Medication Sig Start Date End Date Taking? Authorizing Provider  amLODipine (NORVASC) 5 MG tablet Take 1 tablet (5 mg total) by mouth daily. 10/23/17  Yes Delia Chimes A, MD  aspirin 81 MG tablet Take 81 mg by mouth daily.   Yes [provider]  clopidogrel (PLAVIX) 75 MG tablet Take 1 tablet (75 mg total) by mouth daily. 10/23/17  Yes Stallings, Zoe A, MD  ferrous sulfate (SLOW FE) 160 (50 Fe) MG  TBCR SR tablet Take 1 tablet (160 mg total) by mouth daily. 10/23/17  Yes Forrest Moron, MD  glipiZIDE (GLUCOTROL) 5 MG tablet Take 0.5 tablets (2.5 mg total) by mouth 2 (two) times daily before a meal. 11/22/17  Yes [name], MD  lisinopril (PRINIVIL,ZESTRIL) 40 MG tablet Take 1 tablet (40 mg total) by mouth daily. 11/22/17  Yes [name], MD  traMADol (ULTRAM) 50 MG tablet Take 1-2 tablets (50-100 mg total) by mouth every 6 (six) hours as needed for moderate pain or severe pain. 10/26/17  Yes Georganna Skeans, MD  zolpidem (AMBIEN CR) 12.5 MG CR tablet Take 1 tablet (12.5 mg total) by mouth at bedtime as needed for sleep. 11/24/17  Yes Plovsky, Berneta Sages, MD  HYDROcodone-acetaminophen (NORCO) 5-325 MG tablet Take 1 tablet by mouth every 6 (six) hours as needed for moderate pain. Patient not taking: Reported on 11/24/2017 09/20/17   Horald Pollen, MD   Social History   Socioeconomic History  . Marital status: Married    Spouse name: Not on file  . Number of children: 2  . Years of education: Not on file  . Highest education level: Not on file  Social Needs  . Financial resource strain: Not on file  . Food insecurity - worry: Not on file  . Food insecurity - inability: Not on file  . Transportation needs - medical: Not on file  . Transportation needs - non-medical: Not on file  Occupational History  . Occupation: Retired    Fish farm manager: RETIRED  Tobacco Use  . Smoking status:  Former Smoker    Last attempt to quit: 12/26/2005    Years since quitting: 11.9  . Smokeless tobacco: Never Used  Substance and Sexual Activity  . Alcohol use: No  . Drug use: No  . Sexual activity: No    Birth control/protection: Abstinence  Other Topics Concern  . Not on file  Social History Narrative  . Not on file   Review of Systems  Constitutional: Negative for fatigue and unexpected weight change.  Respiratory: Negative for chest tightness and shortness of breath.   Cardiovascular: Negative for chest pain, palpitations and leg swelling.  Gastrointestinal: Negative for abdominal pain and [name] in stool.  Neurological: Negative for dizziness, syncope, light-headedness and headaches.       Objective:   Physical Exam  Constitutional: She is oriented to person, place, and time. She appears well-developed and well-nourished.  HENT:  Head: Normocephalic and atraumatic.  Mouth/Throat: Oropharynx is clear and moist and mucous membranes are normal. No oral lesions.  Eyes: Conjunctivae and EOM are normal. Pupils are equal, round, and reactive to light.  Neck: Carotid bruit is not present.  Cardiovascular: Normal rate, regular rhythm, normal heart sounds and intact distal pulses.  Pulmonary/Chest: Effort normal and breath sounds normal.  Abdominal: Soft. She exhibits no pulsatile midline mass. There is no tenderness.  Neurological: She is alert and oriented to person, place, and time.  Skin: Skin is warm and dry.  Psychiatric: She has a normal mood and affect. Her behavior is normal.  Vitals reviewed.   Vitals:   11/27/17 1137  BP: 124/62  Pulse: 100  Resp: 18  Temp: 98.3 F (36.8 C)  TempSrc: Oral  SpO2: 98%  Weight: 124 lb (56.2 kg)  Height: 5\' 1"  (1.549 m)      Assessment & Plan:   BRENNEN GARDINER is a 82 y.o. female CKD (chronic kidney disease) stage 4, GFR 15-29 ml/min (HCC) -  Plan: Basic metabolic panel Essential hypertension - Plan: lisinopril  (PRINIVIL,ZESTRIL) 40 MG tablet  - stable bp on current regimen. Continue same doses. Check BMP. Advised to call nephrology for follow up. Orthostatic precautions, and ER/RTc precautions if any return of lightheadedness/near syncope.   Anemia, unspecified type - Plan: CBC  - anemia of chronic dz with renal dz likely. Check CBC.   Lip numbness - Plan: Vitamin B12  - denies other focal numbness or weakness. Denies palmar/plantar sx's, but will start with B12 level, rtc precautions if worsening or involves other areas.   Type 2 diabetes mellitus with stage 4 chronic kidney disease, without long-term current use of insulin ([place]) - Plan: glipiZIDE (GLUCOTROL) 5 MG tablet  - continue same dose glipizide with plan on diabetes recheck in approx 1 month.   concerns discussed with Ambien, but has been tolerating this med for some time and prescribed by psychiatry.   Handicap placard completed as need for assistive device to walk long distance.   Meds ordered this encounter  Medications  . glipiZIDE (GLUCOTROL) 5 MG tablet    Sig: Take 0.5 tablets (2.5 mg total) by mouth daily before breakfast.    Dispense:  45 tablet    Refill:  1  . lisinopril (PRINIVIL,ZESTRIL) 40 MG tablet    Sig: Take 1 tablet (40 mg total) by mouth daily.    Dispense:  10 tablet    Refill:  0   Patient Instructions  If you have not seen Dr. Posey Pronto (nephrologist) recently, would call and see if an appointment is needed. I will repeat that test today as it was increasing slightly when last checked.    I will check your anemia and electrolytes today, as well as B12 level for numbness feeling in lips.   No change in medications for now, but watch for any lightheadedness/dizziness with current medications.  If that occurs - be seen here or in emergency room right away.   Please follow up after January 1st to recheck diabetes.   Continue to follow up with your psychiatrist, including for any Ambien refills.   Return to the  clinic or go to the nearest emergency room if any of your symptoms worsen or new symptoms occur.  Thank you for coming in today.   I personally performed the services described in this documentation, which was scribed in my presence. The recorded information has been reviewed and considered for accuracy and completeness, addended by me as needed, and agree with information above.  Signed,   Gabrielle Ray, MD [facility].  11/29/17 10:26 PM

## 2017-11-28 LAB — CBC
Hematocrit: 35.4 % (ref 34.0–46.6)
Hemoglobin: 11.5 g/dL (ref 11.1–15.9)
MCH: 26.6 pg (ref 26.6–33.0)
MCHC: 32.5 g/dL (ref 31.5–35.7)
MCV: 82 fL (ref 79–97)
Platelets: 404 10*3/uL — ABNORMAL HIGH (ref 150–379)
RBC: 4.33 x10E6/uL (ref 3.77–5.28)
RDW: 21.6 % — ABNORMAL HIGH (ref 12.3–15.4)
WBC: 8.9 10*3/uL (ref 3.4–10.8)

## 2017-11-28 LAB — BASIC METABOLIC PANEL
BUN/Creatinine Ratio: 11 — ABNORMAL LOW (ref 12–28)
BUN: 19 mg/dL (ref 8–27)
CO2: 21 mmol/L (ref 20–29)
Calcium: 9.8 mg/dL (ref 8.7–10.3)
Chloride: 102 mmol/L (ref 96–106)
Creatinine, Ser: 1.72 mg/dL — ABNORMAL HIGH (ref 0.57–1.00)
GFR calc Af Amer: 32 mL/min/{1.73_m2} — ABNORMAL LOW (ref >59)
GFR calc non Af Amer: 28 mL/min/{1.73_m2} — ABNORMAL LOW (ref >59)
Glucose: 123 mg/dL — ABNORMAL HIGH (ref 65–99)
Potassium: 4.3 mmol/L (ref 3.5–5.2)
Sodium: 142 mmol/L (ref 134–144)

## 2017-11-28 LAB — VITAMIN B12: Vitamin B-12: 863 pg/mL (ref 232–1245)

## 2017-12-07 NOTE — Progress Notes (Signed)
Lab letter sent 

## 2017-12-13 ENCOUNTER — Ambulatory Visit: Payer: Self-pay | Admitting: *Deleted

## 2017-12-13 NOTE — Telephone Encounter (Signed)
Patient complains of multiple symptoms- itching all over- more intense the last few days, she has ringing in her ears that is bothering her, the numbness is still in her lip.  Reason for Disposition . [1] Numbness or tingling on both sides of body AND [2] is a new symptom present > 24 hours  Answer Assessment - Initial Assessment Questions 1. SYMPTOM: "What is the main symptom you are concerned about?" (e.g., weakness, numbness)     Itching is bothering her the most 2. ONSET: "When did this start?" (minutes, hours, days; while sleeping)     Patient had had some itching- but this is more intense 3. LAST NORMAL: "When was the last time you were normal (no symptoms)?"     3-4 days patient has noticed an increase 4. PATTERN "Does this come and go, or has it been constant since it started?"  "Is it present now?"     Constant- sometimes not as bad as other times 5. CARDIAC SYMPTOMS: "Have you had any of the following symptoms: chest pain, difficulty breathing, palpitations?"     no 6. NEUROLOGIC SYMPTOMS: "Have you had any of the following symptoms: headache, dizziness, vision loss, double vision, changes in speech, unsteady on your feet?"     No- patient has hip problem 7. OTHER SYMPTOMS: "Do you have any other symptoms?"     Ringing ears, numbness in lip 8. PREGNANCY: "Is there any chance you are pregnant?" "When was your last menstrual period?"     n/a  Protocols used: NEUROLOGIC DEFICIT-A-AH

## 2017-12-13 NOTE — Telephone Encounter (Signed)
Addressed at office visit

## 2017-12-18 ENCOUNTER — Ambulatory Visit (INDEPENDENT_AMBULATORY_CARE_PROVIDER_SITE_OTHER): Payer: Medicare Other | Admitting: Family Medicine

## 2017-12-18 ENCOUNTER — Other Ambulatory Visit: Payer: Self-pay

## 2017-12-18 ENCOUNTER — Encounter: Payer: Self-pay | Admitting: Family Medicine

## 2017-12-18 VITALS — BP 154/72 | HR 82 | Temp 98.9°F | Resp 18 | Ht 61.0 in | Wt 123.4 lb

## 2017-12-18 DIAGNOSIS — I10 Essential (primary) hypertension: Secondary | ICD-10-CM | POA: Diagnosis not present

## 2017-12-18 DIAGNOSIS — D75839 Thrombocytosis, unspecified: Secondary | ICD-10-CM

## 2017-12-18 DIAGNOSIS — R200 Anesthesia of skin: Secondary | ICD-10-CM | POA: Diagnosis not present

## 2017-12-18 DIAGNOSIS — H9319 Tinnitus, unspecified ear: Secondary | ICD-10-CM

## 2017-12-18 DIAGNOSIS — D473 Essential (hemorrhagic) thrombocythemia: Secondary | ICD-10-CM | POA: Diagnosis not present

## 2017-12-18 NOTE — Progress Notes (Addendum)
Subjective:  By signing my name below, I, Gabrielle [name], attest that this documentation has been prepared under the direction and in the presence of Gabrielle Agreste, MD Electronically Signed: Ladene [name], ED Scribe 12/18/2017 at 10:09 AM.   Patient ID: Gabrielle [name], female    DOB: 07/19/1935, 82 y.o.   MRN: [id number]  Chief Complaint  Patient presents with  . Ear Pain    ringing in ears pt states no pain and itching x couple months   . Oral Swelling    pt states lips are still puffy    HPI Gabrielle [name] is a 82 y.o. female who presents to Primary Care at [facility] complaining of persistent tinnitus x several months, close to a yr. Despite chief complaint, pt denies ear itching, ear pain, hearing loss, SI, depression. She has not seen ENT.  Lip Numbness Discussed 12/3. At that time she stated she had numbness to inside lower lip that started around the time she had been hospitalized back in Oct. There was no oral rash or blisters, no extremity symptoms. Normal B12, normal CBC except platelets slightly elevated at 404. She does take Lisinopril 40 mg qd.  Today, pt states that her top and bottom lips feel slightly numb but they do not appear swollen to her. She states the sensation has not changed since Oct. Pt reports intermittent numbness to her hands that seems to occur later in the day since Oct. She denies itching, weakness in extremities, HA, slurred speech. Pt has not recently seen a neurologist.  HTN Pt stopped amlodipine. States it caused itching. She has been checking her BP at home with readings of 140s-150s/60s-70s. Pt was dismissed from her nephrologist.  Patient Active Problem List   Diagnosis Date Noted  . Syncope 10/14/2017  . CKD (chronic kidney disease), stage III ([place]) 10/14/2017  . Need for prophylactic vaccination and inoculation against influenza 09/20/2017  . Right leg pain 09/20/2017  . Contusion of right leg, initial encounter 09/20/2017  . Traumatic  hematoma of right lower leg 09/20/2017  . Abrasion 09/20/2017  . Iron deficiency anemia 07/08/2016  . Chronic insomnia 12/10/2015  . Spinal stenosis of lumbar region 05/21/2014  . Carotid artery disease ([place]) 02/26/2014  . PAD (peripheral artery disease) ([place])   . Renal cell cancer ([place])   . Diabetes mellitus   . Essential hypertension, benign   . Hyperlipidemia   . History of anemia   . CVA (cerebral vascular accident) [facility])    Past Medical History:  Diagnosis Date  . Anxiety   . Arthritis   . Azotemia   . Carotid artery disease ([place])   . Claudication ([place])   . CVA (cerebral vascular accident) ([place])    [place] DIZZINESS 2008  . Depression   . Diabetes mellitus    1990  . Essential hypertension, benign   . History of anemia   . History of gout   . History of kidney stones   . Hyperlipidemia   . Neck pain   . PAD (peripheral artery disease) ([facility])    hx. LCEA, hx Lt renal art. stenting, occl rt renal artery, occluded bil SFAs, moderatie iliac disease,    . Renal cell cancer ([place]) 2001  . Seizure [facility])    Past Surgical History:  Procedure Laterality Date  . CAROTID ENDARTERECTOMY     LEFT  . INCISION AND DRAINAGE ABSCESS Right 10/26/2017   Procedure: INCISION AND DRAINAGE HEMATOMA RIGHT SHIN;  Surgeon: Gabrielle Skeans, MD;  Location: [facility] OR;  Service: General;  Laterality: Right;  . NEPHRECTOMY     RIGHT  . NM MYOCAR PERF WALL MOTION  07/12/2011   normal  . PV angiogram  2004   Lt renal artery stent  . TONSILLECTOMY    . URETERAL STENT PLACEMENT     LEFT   Allergies  Allergen Reactions  . Amlodipine Itching  . Codeine Nausea And Vomiting  . Coreg [Carvedilol] Nausea And Vomiting  . Fentanyl   . Lacosamide      Other name is, VIMPAT  . Levetiracetam Other (See Comments)    Strange feelings in head  . Metformin And Related   . Metoprolol   . Morphine And Related Itching  . Oxybutynin Itching  . Sertraline Nausea Only and Other (See Comments)    Swelling in  mouth  . Tessalon [Benzonatate]   . Other Rash    BETA BLOCKER   Prior to Admission medications   Medication Sig Start Date End Date Taking? Authorizing Provider  amLODipine (NORVASC) 5 MG tablet Take 1 tablet (5 mg total) by mouth daily. 10/23/17   Forrest Moron, MD  aspirin 81 MG tablet Take 81 mg by mouth daily.    [provider]  clopidogrel (PLAVIX) 75 MG tablet Take 1 tablet (75 mg total) by mouth daily. 10/23/17   Forrest Moron, MD  ferrous sulfate (SLOW FE) 160 (50 Fe) MG TBCR SR tablet Take 1 tablet (160 mg total) by mouth daily. 10/23/17   Forrest Moron, MD  glipiZIDE (GLUCOTROL) 5 MG tablet Take 0.5 tablets (2.5 mg total) by mouth daily before breakfast. 11/27/17   Gabrielle Agreste, MD  lisinopril (PRINIVIL,ZESTRIL) 40 MG tablet Take 1 tablet (40 mg total) by mouth daily. 11/27/17   Gabrielle Agreste, MD  traMADol (ULTRAM) 50 MG tablet Take 1-2 tablets (50-100 mg total) by mouth every 6 (six) hours as needed for moderate pain or severe pain. 10/26/17   Gabrielle Skeans, MD  zolpidem (AMBIEN CR) 12.5 MG CR tablet Take 1 tablet (12.5 mg total) by mouth at bedtime as needed for sleep. 11/24/17   Norma Fredrickson, MD   Social History   Socioeconomic History  . Marital status: Married    Spouse name: Not on file  . Number of children: 2  . Years of education: Not on file  . Highest education level: Not on file  Social Needs  . Financial resource strain: Not on file  . Food insecurity - worry: Not on file  . Food insecurity - inability: Not on file  . Transportation needs - medical: Not on file  . Transportation needs - non-medical: Not on file  Occupational History  . Occupation: Retired    Fish farm manager: RETIRED  Tobacco Use  . Smoking status: Former Smoker    Last attempt to quit: 12/26/2005    Years since quitting: 11.9  . Smokeless tobacco: Never Used  Substance and Sexual Activity  . Alcohol use: No  . Drug use: No  . Sexual activity: No    Birth  control/protection: Abstinence  Other Topics Concern  . Not on file  Social History Narrative  . Not on file   Review of Systems  HENT: Positive for tinnitus. Negative for ear pain, facial swelling and hearing loss.   Neurological: Positive for numbness (lips). Negative for speech difficulty, weakness and headaches.  Psychiatric/Behavioral: Negative for dysphoric mood, self-injury and suicidal ideas.      Objective:   Physical Exam  Constitutional: She is oriented to person, place, and time. She appears well-developed and well-nourished. No distress.  HENT:  Head: Normocephalic and atraumatic.  Right Ear: Hearing, tympanic membrane, external ear and ear canal normal.  Left Ear: Hearing, tympanic membrane, external ear and ear canal normal.  Nose: Nose normal.  Mouth/Throat: Oropharynx is clear and moist and mucous membranes are normal. No oropharyngeal exudate.  Mouth/Throat: No rash. No lip swelling.  L TM: minimal cerumen. Not obstructed R TM: pearly grey.  Eyes: Conjunctivae and EOM are normal. Pupils are equal, round, and reactive to light.  Cardiovascular: Normal rate, regular rhythm, normal heart sounds and intact distal pulses.  No murmur heard. Pulmonary/Chest: Effort normal and breath sounds normal. No respiratory distress. She has no wheezes. She has no rhonchi.  Neurological: She is alert and oriented to person, place, and time.  Skin: Skin is warm and dry. No rash noted.  Psychiatric: She has a normal mood and affect. Her behavior is normal.  Vitals reviewed.  Vitals:   12/18/17 0934 12/18/17 0952  BP: (!) 142/60 (!) 154/72  Pulse: 82   Resp: 18   Temp: 98.9 F (37.2 C)   TempSrc: Oral   SpO2: 97%   Weight: 123 lb 6.4 oz (56 kg)   Height: 5\' 1"  (1.549 m)        Assessment & Plan:  BRYCE KIMBLE is a 82 y.o. female Perioral numbness - Plan: TSH, CBC, Magnesium, Ambulatory referral to ENT, Ambulatory referral to Neurology Hand numbness - Plan: TSH, CBC,  Magnesium, Ambulatory referral to Neurology  - Prior B12 normal. Check magnesium, TSH, refer to neuro for further evaluation given episodic hand symptoms as well. No apparent angioedema, no oral lesions. RTC precautions if worsening symptoms  Tinnitus, unspecified laterality - Plan: Ambulatory referral to ENT  -Refer to ENT, likely will have audiology/20 g initially. Background noise discussed.  Thrombocytosis ([place])  - repeat CBC.   Essential hypertension  - mildly elevated. Goal less than 140/90 ideally with CKD. Monitor at home and rtc precautions.   No orders of the defined types were placed in this encounter.  Patient Instructions   B12 level normal last time. We can check magnesium level, thyroid test, and repeat platelet level, but I will also refer you to a neurologist to discuss these symptoms and episodic hand symptoms further. If any lip swelling - be seen right away as that may be a reaction to one of your medications.   See information below about ringing in ears, but I would like you to meet with Ear, Nose and Throat as hearing testing may be next step.  Background noise may be helpful as well.   Blood pressure was slightly high here today. If it remains over 140/90 in next week - return to discuss changes. We may need to refer you back to a nephrologist.   Return to the clinic or go to the nearest emergency room if any of your symptoms worsen or new symptoms occur.  Tinnitus Tinnitus refers to hearing a sound when there is no actual source for that sound. This is often described as ringing in the ears. However, people with this condition may hear a variety of noises. A person may hear the sound in one ear or in both ears. The sounds of tinnitus can be soft, loud, or somewhere in between. Tinnitus can last for a few seconds or can be constant for days. It may go away without treatment and come back at  various times. When tinnitus is constant or happens often, it can lead to  other problems, such as trouble sleeping and trouble concentrating. Almost everyone experiences tinnitus at some point. Tinnitus that is long-lasting (chronic) or comes back often is a problem that may require medical attention. What are the causes? The cause of tinnitus is often not known. In some cases, it can result from other problems or conditions, including:  Exposure to loud noises from machinery, music, or other sources.  Hearing loss.  Ear or sinus infections.  Earwax buildup.  A foreign object in the ear.  Use of certain medicines.  Use of alcohol and caffeine.  High blood pressure.  Heart diseases.  Anemia.  Allergies.  Meniere disease.  Thyroid problems.  Tumors.  An enlarged part of a weakened blood vessel (aneurysm).  What are the signs or symptoms? The main symptom of tinnitus is hearing a sound when there is no source for that sound. It may sound like:  Buzzing.  Roaring.  Ringing.  Blowing air, similar to the sound heard when you listen to a seashell.  Hissing.  Whistling.  Sizzling.  Humming.  Running water.  A sustained musical note.  How is this diagnosed? Tinnitus is diagnosed based on your symptoms. Your health care provider will do a physical exam. A comprehensive hearing exam (audiologic exam) will be done if your tinnitus:  Affects only one ear (unilateral).  Causes hearing difficulties.  Lasts 6 months or longer.  You may also need to see a health care provider who specializes in hearing disorders (audiologist). You may be asked to complete a questionnaire to determine the severity of your tinnitus. Tests may be done to help determine the cause and to rule out other conditions. These can include:  Imaging studies of your head and brain, such as: ? A CT scan. ? An MRI.  An imaging study of your blood vessels (angiogram).  How is this treated? Treating an underlying medical condition can sometimes make tinnitus go  away. If your tinnitus continues, other treatments may include:  Medicines, such as certain antidepressants or sleeping aids.  Sound generators to mask the tinnitus. These include: ? Tabletop sound machines that play relaxing sounds to help you fall asleep. ? Wearable devices that fit in your ear and play sounds or music. ? A small device that uses headphones to deliver a signal embedded in music (acoustic neural stimulation). In time, this may change the pathways of your brain and make you less sensitive to tinnitus. This device is used for very severe cases when no other treatment is working.  Therapy and counseling to help you manage the stress of living with tinnitus.  Using hearing aids or cochlear implants, if your tinnitus is related to hearing loss.  Follow these instructions at home:  When possible, avoid being in loud places and being exposed to loud sounds.  Wear hearing protection, such as earplugs, when you are exposed to loud noises.  Do not take stimulants, such as nicotine, alcohol, or caffeine.  Practice techniques for reducing stress, such as meditation, yoga, or deep breathing.  Use a white noise machine, a humidifier, or other devices to mask the sound of tinnitus.  Sleep with your head slightly raised. This may reduce the impact of tinnitus.  Try to get plenty of rest each night. Contact a health care provider if:  You have tinnitus in just one ear.  Your tinnitus continues for 3 weeks or longer without stopping.  Home  care measures are not helping.  You have tinnitus after a head injury.  You have tinnitus along with any of the following: ? Dizziness. ? Loss of balance. ? Nausea and vomiting. This information is not intended to replace advice given to you by your health care provider. Make sure you discuss any questions you have with your health care provider. Document Released: 12/12/2005 Document Revised: 08/14/2016 Document Reviewed:  05/14/2014 Elsevier Interactive Patient Education  2018 [employer].   IF you received an x-ray today, you will receive an invoice from [facility]. Please contact [facility] at [phone] with questions or concerns regarding your invoice.   IF you received labwork today, you will receive an invoice from [place]. Please contact [facility] at [phone] with questions or concerns regarding your invoice.   Our billing staff will not be able to assist you with questions regarding bills from these companies.  You will be contacted with the lab results as soon as they are available. The fastest way to get your results is to activate your My Chart account. Instructions are located on the last page of this paperwork. If you have not heard from [place] regarding the results in 2 weeks, please contact this office.      I personally performed the services described in this documentation, which was scribed in my presence. The recorded information has been reviewed and considered for accuracy and completeness, addended by me as needed, and agree with information above.  Signed,   [name], MD [facility].  12/18/17 2:50 PM

## 2017-12-18 NOTE — Telephone Encounter (Signed)
Patient is being managed by Dr. Carlota Raspberry. She was stopped from those meds due to her hospitalization for syncope and hypotension. The plan was for her to follow up to determine if she should be restarted.  Lab Results  Component Value Date   HGBA1C 6.2 (H) 09/25/2017

## 2017-12-18 NOTE — Patient Instructions (Addendum)
B12 level normal last time. We can check magnesium level, thyroid test, and repeat platelet level, but I will also refer you to a neurologist to discuss these symptoms and episodic hand symptoms further. If any lip swelling - be seen right away as that may be a reaction to one of your medications.   See information below about ringing in ears, but I would like you to meet with Ear, Nose and Throat as hearing testing may be next step.  Background noise may be helpful as well.   Blood pressure was slightly high here today. If it remains over 140/90 in next week - return to discuss changes. We may need to refer you back to a nephrologist.   Return to the clinic or go to the nearest emergency room if any of your symptoms worsen or new symptoms occur.  Tinnitus Tinnitus refers to hearing a sound when there is no actual source for that sound. This is often described as ringing in the ears. However, people with this condition may hear a variety of noises. A person may hear the sound in one ear or in both ears. The sounds of tinnitus can be soft, loud, or somewhere in between. Tinnitus can last for a few seconds or can be constant for days. It may go away without treatment and come back at various times. When tinnitus is constant or happens often, it can lead to other problems, such as trouble sleeping and trouble concentrating. Almost everyone experiences tinnitus at some point. Tinnitus that is long-lasting (chronic) or comes back often is a problem that may require medical attention. What are the causes? The cause of tinnitus is often not known. In some cases, it can result from other problems or conditions, including:  Exposure to loud noises from machinery, music, or other sources.  Hearing loss.  Ear or sinus infections.  Earwax buildup.  A foreign object in the ear.  Use of certain medicines.  Use of alcohol and caffeine.  High blood pressure.  Heart  diseases.  Anemia.  Allergies.  Meniere disease.  Thyroid problems.  Tumors.  An enlarged part of a weakened blood vessel (aneurysm).  What are the signs or symptoms? The main symptom of tinnitus is hearing a sound when there is no source for that sound. It may sound like:  Buzzing.  Roaring.  Ringing.  Blowing air, similar to the sound heard when you listen to a seashell.  Hissing.  Whistling.  Sizzling.  Humming.  Running water.  A sustained musical note.  How is this diagnosed? Tinnitus is diagnosed based on your symptoms. Your health care provider will do a physical exam. A comprehensive hearing exam (audiologic exam) will be done if your tinnitus:  Affects only one ear (unilateral).  Causes hearing difficulties.  Lasts 6 months or longer.  You may also need to see a health care provider who specializes in hearing disorders (audiologist). You may be asked to complete a questionnaire to determine the severity of your tinnitus. Tests may be done to help determine the cause and to rule out other conditions. These can include:  Imaging studies of your head and brain, such as: ? A CT scan. ? An MRI.  An imaging study of your blood vessels (angiogram).  How is this treated? Treating an underlying medical condition can sometimes make tinnitus go away. If your tinnitus continues, other treatments may include:  Medicines, such as certain antidepressants or sleeping aids.  Sound generators to mask the tinnitus. These  include: ? Tabletop sound machines that play relaxing sounds to help you fall asleep. ? Wearable devices that fit in your ear and play sounds or music. ? A small device that uses headphones to deliver a signal embedded in music (acoustic neural stimulation). In time, this may change the pathways of your brain and make you less sensitive to tinnitus. This device is used for very severe cases when no other treatment is working.  Therapy and  counseling to help you manage the stress of living with tinnitus.  Using hearing aids or cochlear implants, if your tinnitus is related to hearing loss.  Follow these instructions at home:  When possible, avoid being in loud places and being exposed to loud sounds.  Wear hearing protection, such as earplugs, when you are exposed to loud noises.  Do not take stimulants, such as nicotine, alcohol, or caffeine.  Practice techniques for reducing stress, such as meditation, yoga, or deep breathing.  Use a white noise machine, a humidifier, or other devices to mask the sound of tinnitus.  Sleep with your head slightly raised. This may reduce the impact of tinnitus.  Try to get plenty of rest each night. Contact a health care provider if:  You have tinnitus in just one ear.  Your tinnitus continues for 3 weeks or longer without stopping.  Home care measures are not helping.  You have tinnitus after a head injury.  You have tinnitus along with any of the following: ? Dizziness. ? Loss of balance. ? Nausea and vomiting. This information is not intended to replace advice given to you by your health care provider. Make sure you discuss any questions you have with your health care provider. Document Released: 12/12/2005 Document Revised: 08/14/2016 Document Reviewed: 05/14/2014 Elsevier Interactive Patient Education  2018 [employer].   IF you received an x-ray today, you will receive an invoice from [facility]. Please contact [facility] Radiology at [phone] with questions or concerns regarding your invoice.   IF you received labwork today, you will receive an invoice from [place]. Please contact [facility] at [phone] with questions or concerns regarding your invoice.   Our billing staff will not be able to assist you with questions regarding bills from these companies.  You will be contacted with the lab results as soon as they are available. The fastest way to  get your results is to activate your My Chart account. Instructions are located on the last page of this paperwork. If you have not heard from [place] regarding the results in 2 weeks, please contact this office.

## 2017-12-19 LAB — MAGNESIUM: Magnesium: 1.8 mg/dL (ref 1.6–2.3)

## 2017-12-19 LAB — CBC
Hematocrit: 36 % (ref 34.0–46.6)
Hemoglobin: 11.7 g/dL (ref 11.1–15.9)
MCH: 27.3 pg (ref 26.6–33.0)
MCHC: 32.5 g/dL (ref 31.5–35.7)
MCV: 84 fL (ref 79–97)
Platelets: 431 10*3/uL — ABNORMAL HIGH (ref 150–379)
RBC: 4.28 x10E6/uL (ref 3.77–5.28)
RDW: 19.7 % — ABNORMAL HIGH (ref 12.3–15.4)
WBC: 5.4 10*3/uL (ref 3.4–10.8)

## 2017-12-19 LAB — TSH: TSH: 1.7 u[IU]/mL (ref 0.450–4.500)

## 2017-12-25 ENCOUNTER — Other Ambulatory Visit: Payer: Self-pay | Admitting: Family Medicine

## 2017-12-25 ENCOUNTER — Other Ambulatory Visit: Payer: Self-pay

## 2017-12-25 DIAGNOSIS — I10 Essential (primary) hypertension: Secondary | ICD-10-CM

## 2017-12-25 MED ORDER — LISINOPRIL 40 MG PO TABS: 40.0000 mg | ORAL_TABLET | Freq: Every day | ORAL | 0 refills | Status: DC

## 2017-12-25 NOTE — Telephone Encounter (Signed)
Copied from [place]. Topic: Quick Communication - See Telephone Encounter >> Dec 25, 2017 11:50 AM [place], Laban Emperor wrote: CRM for notification. See Telephone encounter for:  Pt called in for a refill on her  lisinopril (PRINIVIL,ZESTRIL) 40 MG tablet [[id number]]. She is very upset that the last refill was only called in for 10 days, she states that she normally gets her refill for the whole year.  Called over to PCP and per [name] he will send her a refill for 30 days and will f/u with Dr. Carlota Raspberry to see about more refills.  Refills should go to [facility] [place] [place] [phone] (Phone) [phone] (Fax).  Please follow up with pt. 12/25/17.

## 2017-12-27 NOTE — Telephone Encounter (Signed)
Please see last office visit regarding hypertension. Levels were slightly elevated, and plan to monitor home BPs to decide if medication changes needed, as she had stopped amlodipine. If home blood pressures are controlled, I can extend that prescription to 90 day supply since we checked electrolytes in December.

## 2017-12-27 NOTE — Telephone Encounter (Signed)
Not sure if you wanted to continue. 30 day supply given in interim.

## 2017-12-28 NOTE — Telephone Encounter (Signed)
Error

## 2018-01-04 ENCOUNTER — Ambulatory Visit: Payer: Medicare Other | Admitting: Family Medicine

## 2018-01-15 DIAGNOSIS — H9313 Tinnitus, bilateral: Secondary | ICD-10-CM | POA: Diagnosis not present

## 2018-01-15 DIAGNOSIS — H903 Sensorineural hearing loss, bilateral: Secondary | ICD-10-CM | POA: Diagnosis not present

## 2018-01-22 ENCOUNTER — Other Ambulatory Visit: Payer: Self-pay | Admitting: Family Medicine

## 2018-01-22 DIAGNOSIS — I10 Essential (primary) hypertension: Secondary | ICD-10-CM

## 2018-01-28 ENCOUNTER — Other Ambulatory Visit: Payer: Self-pay | Admitting: Family Medicine

## 2018-01-28 DIAGNOSIS — I10 Essential (primary) hypertension: Secondary | ICD-10-CM

## 2018-01-29 NOTE — Telephone Encounter (Signed)
Pt picked up 2 days ago

## 2018-02-07 DIAGNOSIS — R079 Chest pain, unspecified: Secondary | ICD-10-CM | POA: Diagnosis not present

## 2018-02-07 DIAGNOSIS — M5136 Other intervertebral disc degeneration, lumbar region: Secondary | ICD-10-CM | POA: Diagnosis not present

## 2018-02-07 DIAGNOSIS — R350 Frequency of micturition: Secondary | ICD-10-CM | POA: Diagnosis not present

## 2018-02-07 DIAGNOSIS — M549 Dorsalgia, unspecified: Secondary | ICD-10-CM | POA: Diagnosis not present

## 2018-02-07 DIAGNOSIS — R531 Weakness: Secondary | ICD-10-CM | POA: Diagnosis not present

## 2018-02-07 DIAGNOSIS — I10 Essential (primary) hypertension: Secondary | ICD-10-CM | POA: Diagnosis not present

## 2018-02-07 DIAGNOSIS — M5431 Sciatica, right side: Secondary | ICD-10-CM | POA: Diagnosis not present

## 2018-02-07 DIAGNOSIS — M79605 Pain in left leg: Secondary | ICD-10-CM | POA: Diagnosis not present

## 2018-02-07 DIAGNOSIS — E785 Hyperlipidemia, unspecified: Secondary | ICD-10-CM | POA: Diagnosis not present

## 2018-02-07 DIAGNOSIS — E119 Type 2 diabetes mellitus without complications: Secondary | ICD-10-CM | POA: Diagnosis not present

## 2018-02-07 DIAGNOSIS — M25551 Pain in right hip: Secondary | ICD-10-CM | POA: Diagnosis not present

## 2018-02-07 DIAGNOSIS — I700 Atherosclerosis of aorta: Secondary | ICD-10-CM | POA: Diagnosis not present

## 2018-02-19 DIAGNOSIS — Z8554 Personal history of malignant neoplasm of ureter: Secondary | ICD-10-CM | POA: Diagnosis not present

## 2018-02-19 DIAGNOSIS — Z859 Personal history of malignant neoplasm, unspecified: Secondary | ICD-10-CM | POA: Diagnosis not present

## 2018-02-19 DIAGNOSIS — Z8559 Personal history of malignant neoplasm of other urinary tract organ: Secondary | ICD-10-CM | POA: Diagnosis not present

## 2018-02-19 DIAGNOSIS — Z85528 Personal history of other malignant neoplasm of kidney: Secondary | ICD-10-CM | POA: Diagnosis not present

## 2018-02-19 DIAGNOSIS — Z1371 Encounter for nonprocreative screening for genetic disease carrier status: Secondary | ICD-10-CM | POA: Diagnosis not present

## 2018-03-09 ENCOUNTER — Ambulatory Visit (HOSPITAL_COMMUNITY): Payer: Self-pay | Admitting: Psychiatry

## 2018-03-18 IMAGING — CT CT ABD-PELV W/O CM
2 of 5 series · 16 of 46 positions shown, 18 images · non-contrast
Comparison: Abdominal ultrasound performed 01/02/2015

CLINICAL DATA: Acute onset of upper abdominal pain, nausea and
vomiting. Leukocytosis. Initial encounter.

EXAM:
CT ABDOMEN AND PELVIS WITHOUT CONTRAST
TECHNIQUE: Multidetector CT imaging of the abdomen and pelvis was performed
following the standard protocol without IV contrast.

[Series 2: abd/pel w/o · axial · non-contrast · 0.66mm/px · z∈[-570,-230]mm · 13 of 78 slices shown, 15 images]
[im 5/78  soft-tissue]
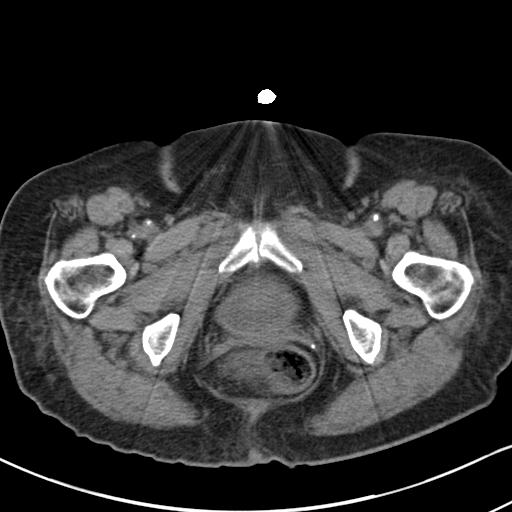
[im 5/78  bone]
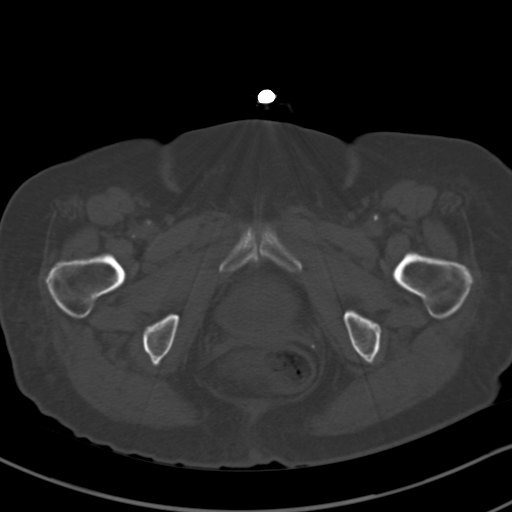
[im 10/78  soft-tissue]
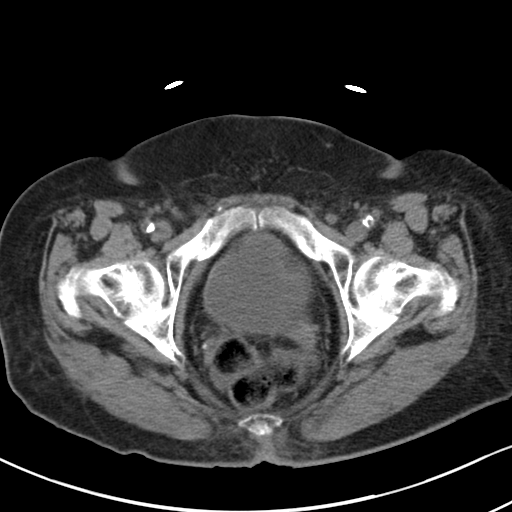
[im 19/78  soft-tissue]
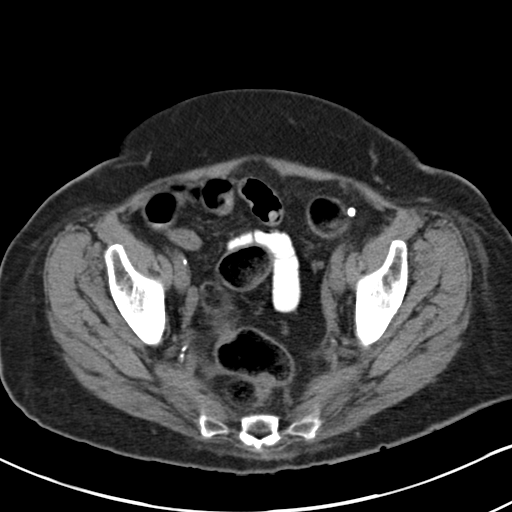
[im 23/78  soft-tissue]
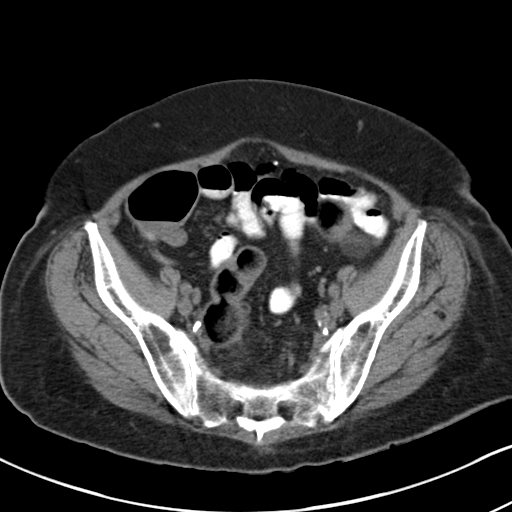
[im 28/78  soft-tissue]
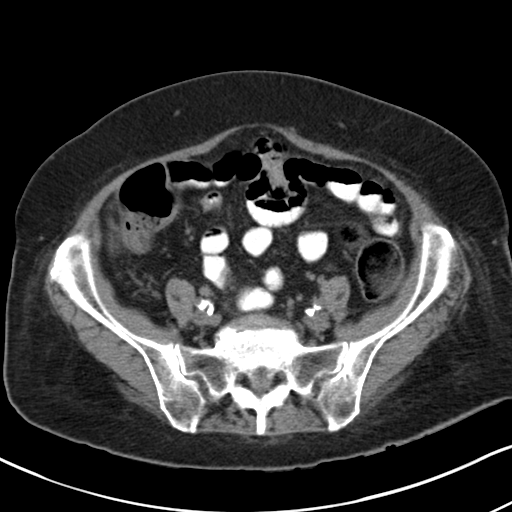
[im 32/78  soft-tissue]
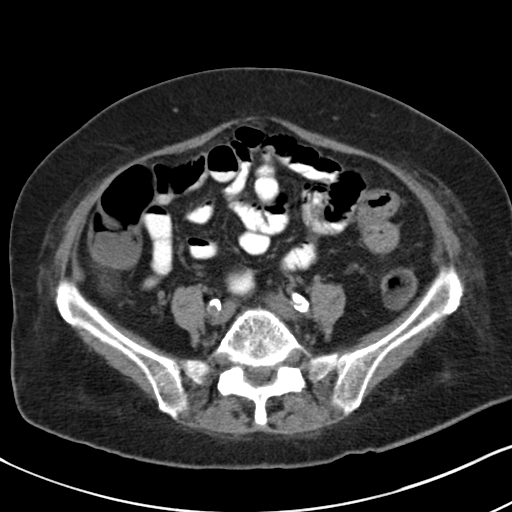
[im 41/78  soft-tissue]
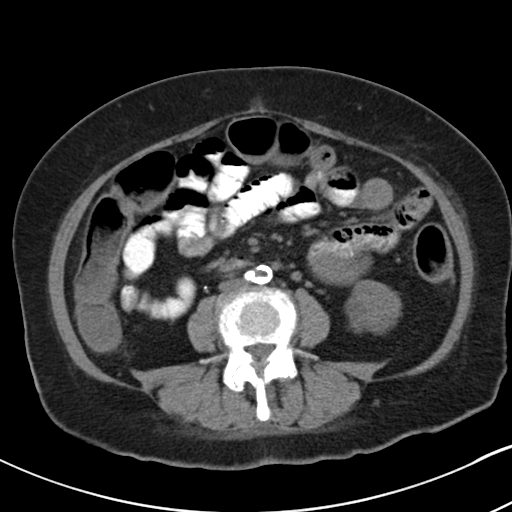
[im 46/78  soft-tissue]
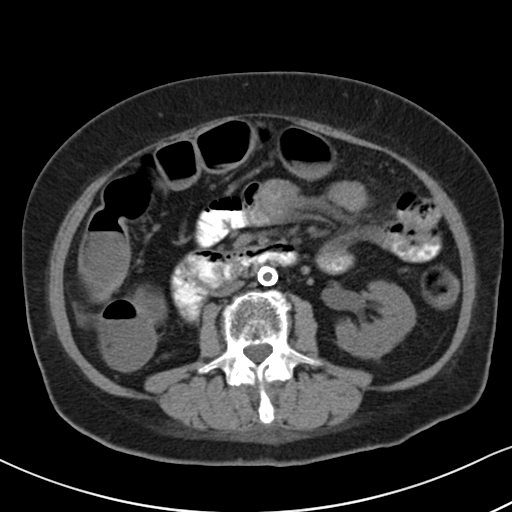
[im 50/78  soft-tissue]
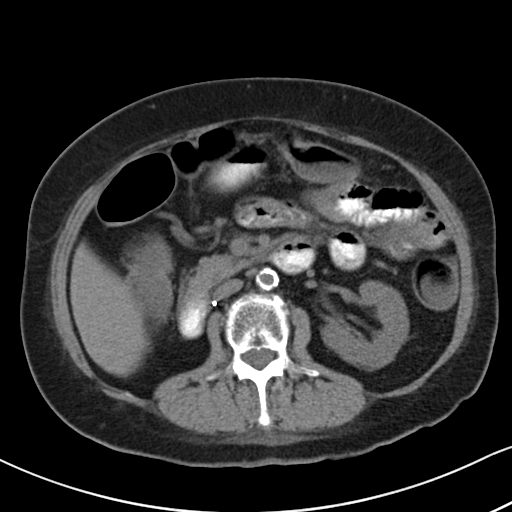
[im 50/78  bone]
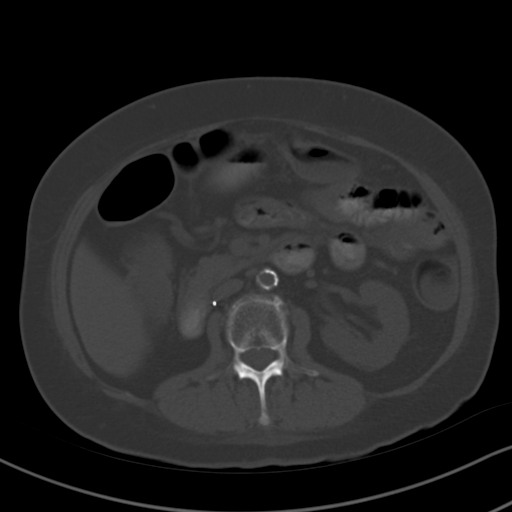
[im 55/78  soft-tissue]
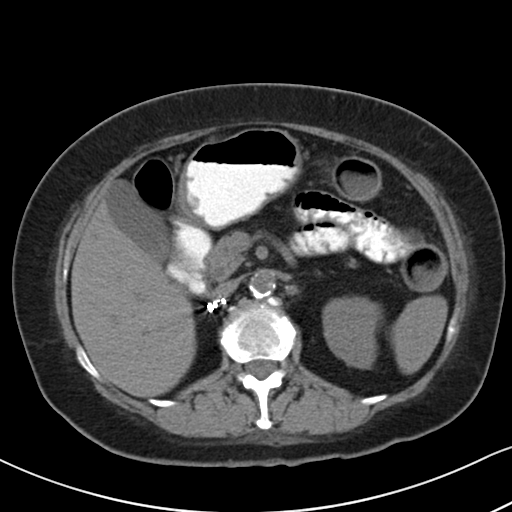
[im 59/78  soft-tissue]
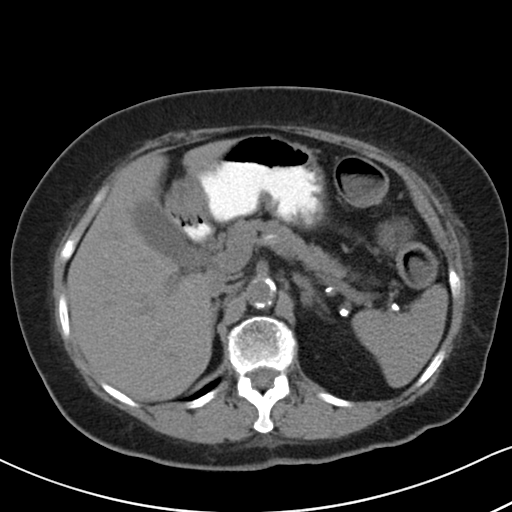
[im 68/78  soft-tissue]
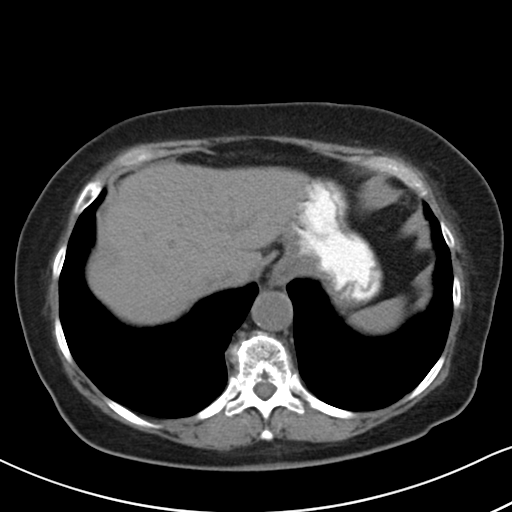
[im 73/78  soft-tissue]
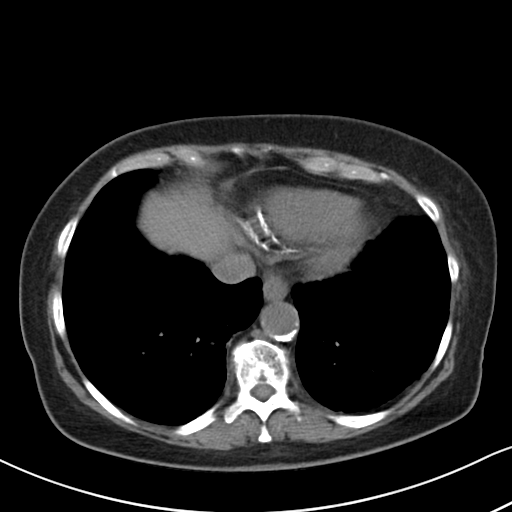

[Series 6: coronal · coronal · 0.64mm/px · 3 of 120 slices shown]
[im 40/120  soft-tissue]
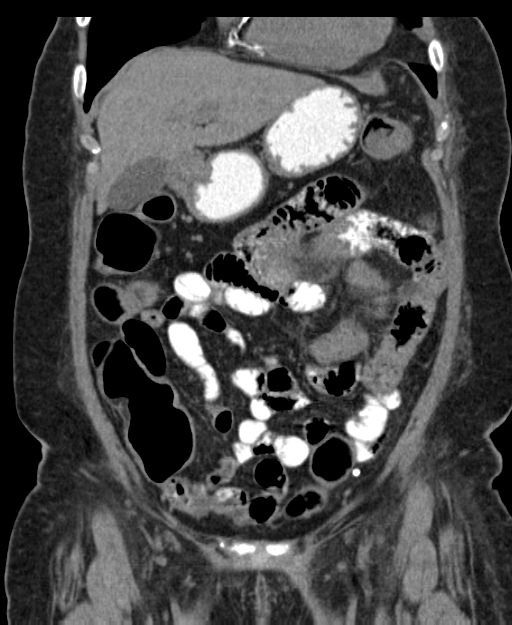
[im 53/120  soft-tissue]
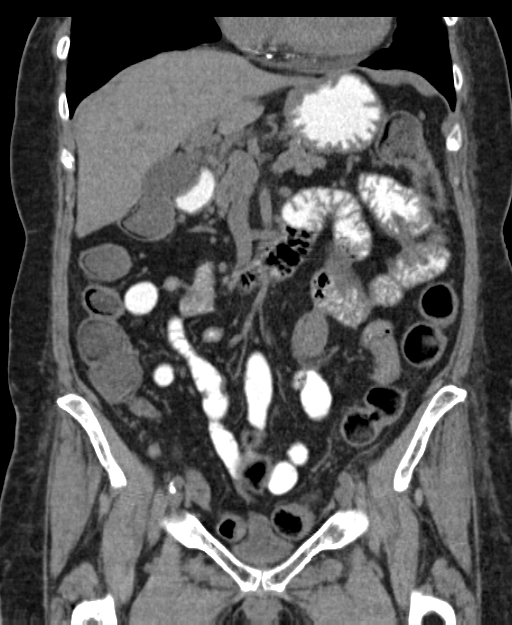
[im 67/120  soft-tissue]
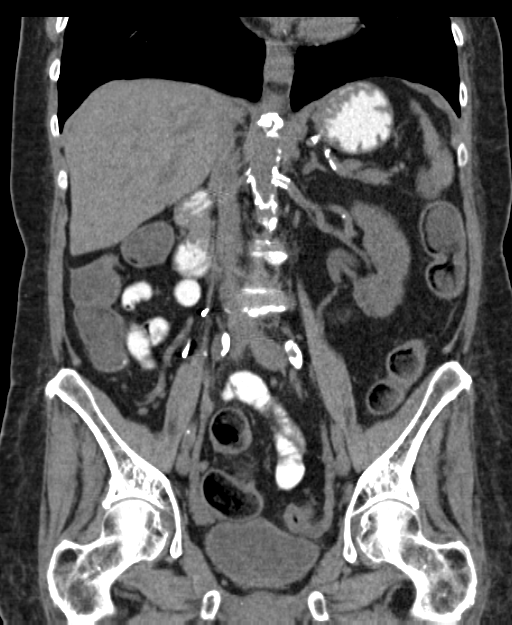

[16 of 46 positions shown; findings below may reference images not displayed]

FINDINGS: Mild emphysema and scarring are noted at the lung bases. Scattered
coronary artery calcifications are seen.

The liver and spleen are unremarkable in appearance. The gallbladder
is within normal limits. The pancreas and adrenal glands are
unremarkable.

The patient is status post right-sided nephrectomy. A small left
renal cyst is noted, measuring 1.5 cm. There is no evidence of
hydronephrosis. No renal or ureteral stones are seen. No perinephric
stranding is appreciated.

No free fluid is identified. The small bowel is unremarkable in
appearance. The stomach is within normal limits. No acute vascular
abnormalities are seen. Diffuse calcification is seen along the
abdominal aorta and its branches, with likely severe luminal
narrowing along the distal abdominal aorta and common iliac arteries
bilaterally.

The appendix is not definitely characterized; there is no evidence
for appendicitis. Slight inflammation about the distal descending
colon could reflect a mild infectious or inflammatory process. The
colon is otherwise unremarkable.

The bladder is mildly distended and grossly unremarkable. The
patient is status post hysterectomy. No suspicious adnexal masses
are seen. The ovaries are relatively symmetric. No inguinal
lymphadenopathy is seen.

No acute osseous abnormalities are identified. Vacuum phenomenon is
noted at L5-S1. Underlying facet disease is noted.
IMPRESSION: 1. Slight inflammation about the distal descending colon could
reflect a mild infectious or inflammatory process.
2. Mild emphysema and scarring at the lung bases.
3. Scattered coronary artery calcifications seen.
4. Small left renal cyst noted.
5. Diffuse calcification along the abdominal aorta and its branches,
with likely severe luminal narrowing along the distal abdominal
aorta and common iliac arteries bilaterally.

## 2018-03-18 IMAGING — CR DG ABDOMEN ACUTE W/ 1V CHEST
3 series · 3 of 3 positions shown · non-contrast
Comparison: Chest and abdominal radiographs performed 12/23/2014

CLINICAL DATA: Acute onset of lower abdominal pain, nausea,
vomiting and diarrhea. Initial encounter.

EXAM:
DG ABDOMEN ACUTE W/ 1V CHEST

[w chest pa]
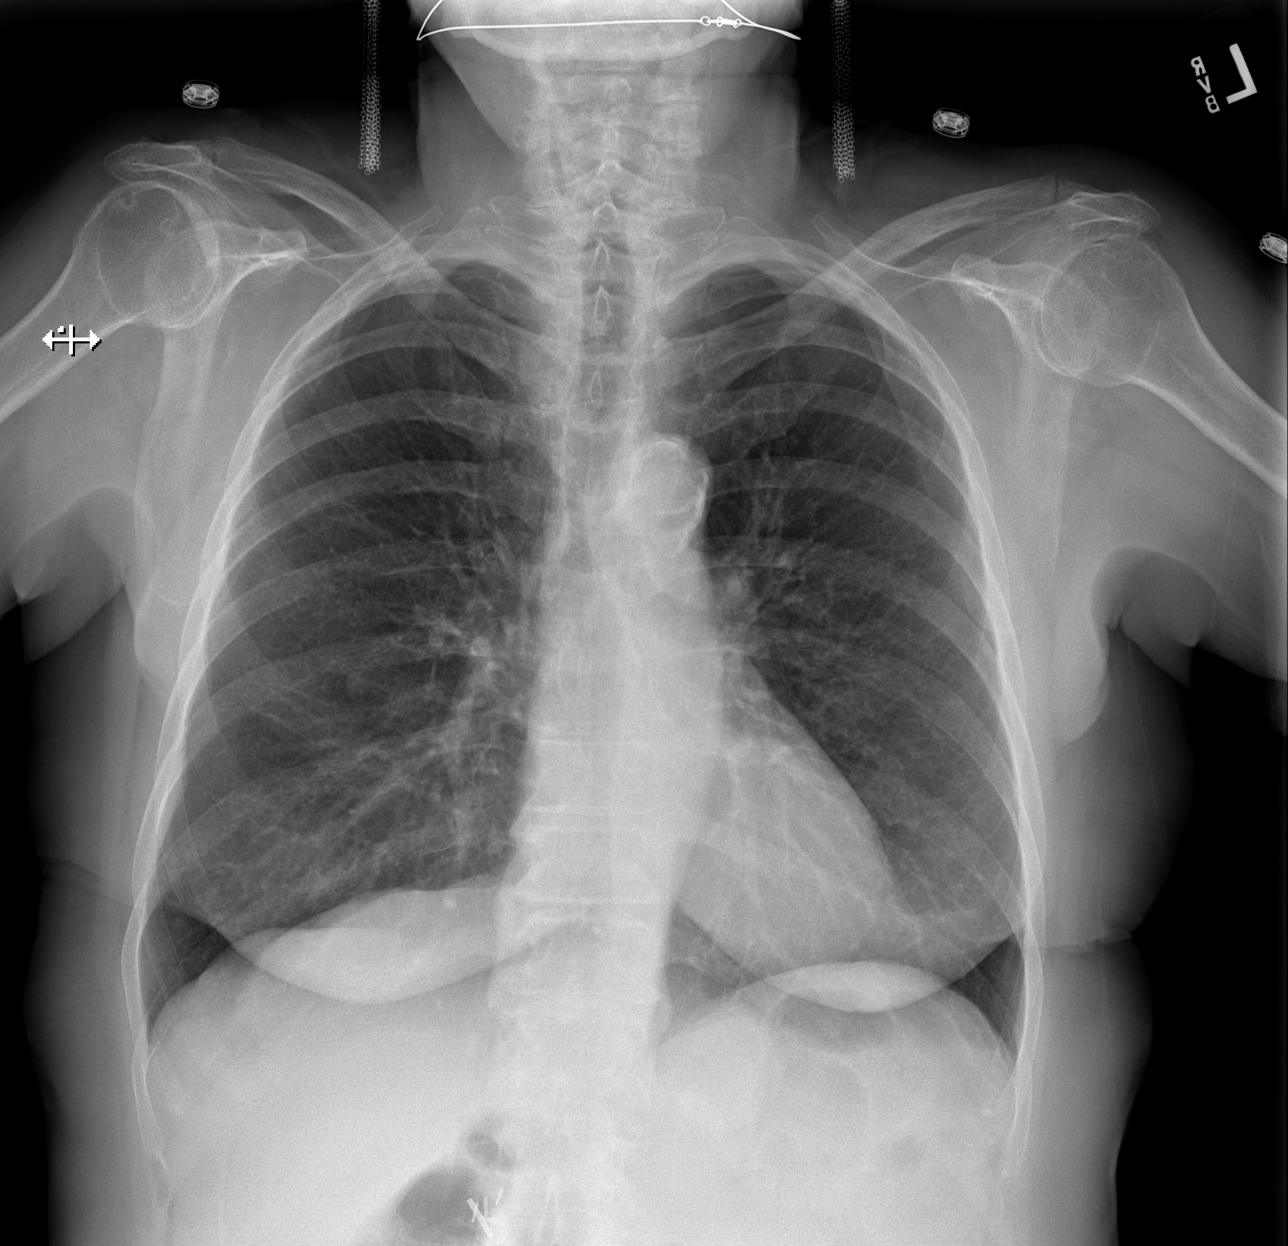

[w abdomen upright]
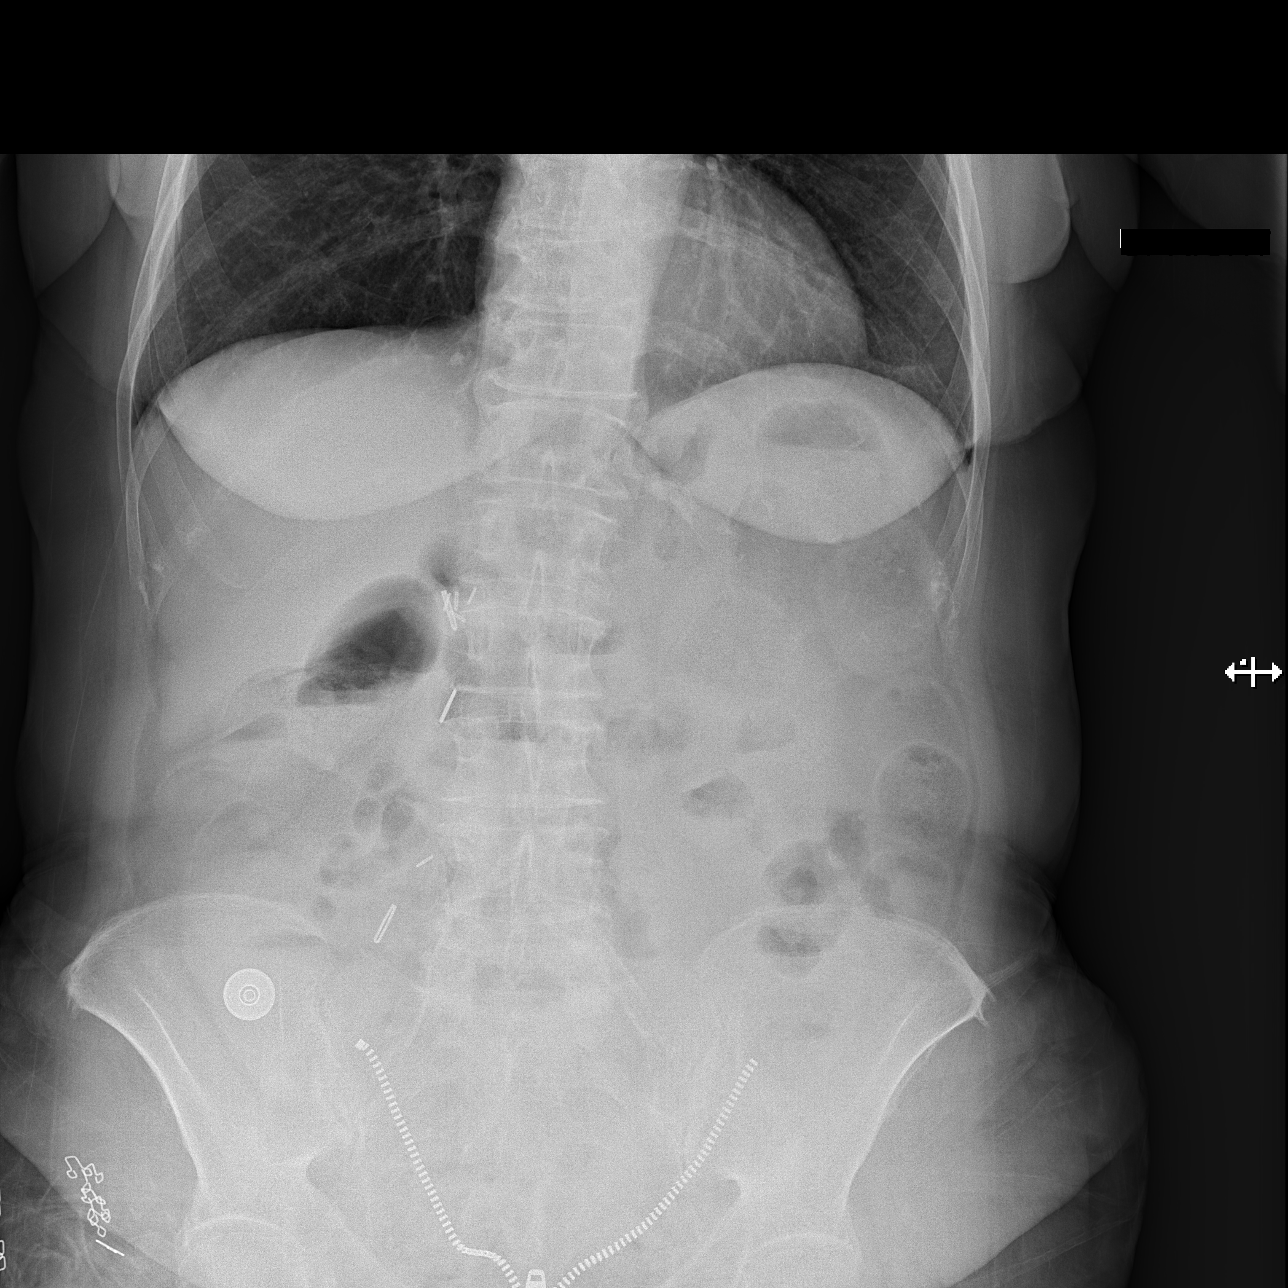

[t abdomen supine]
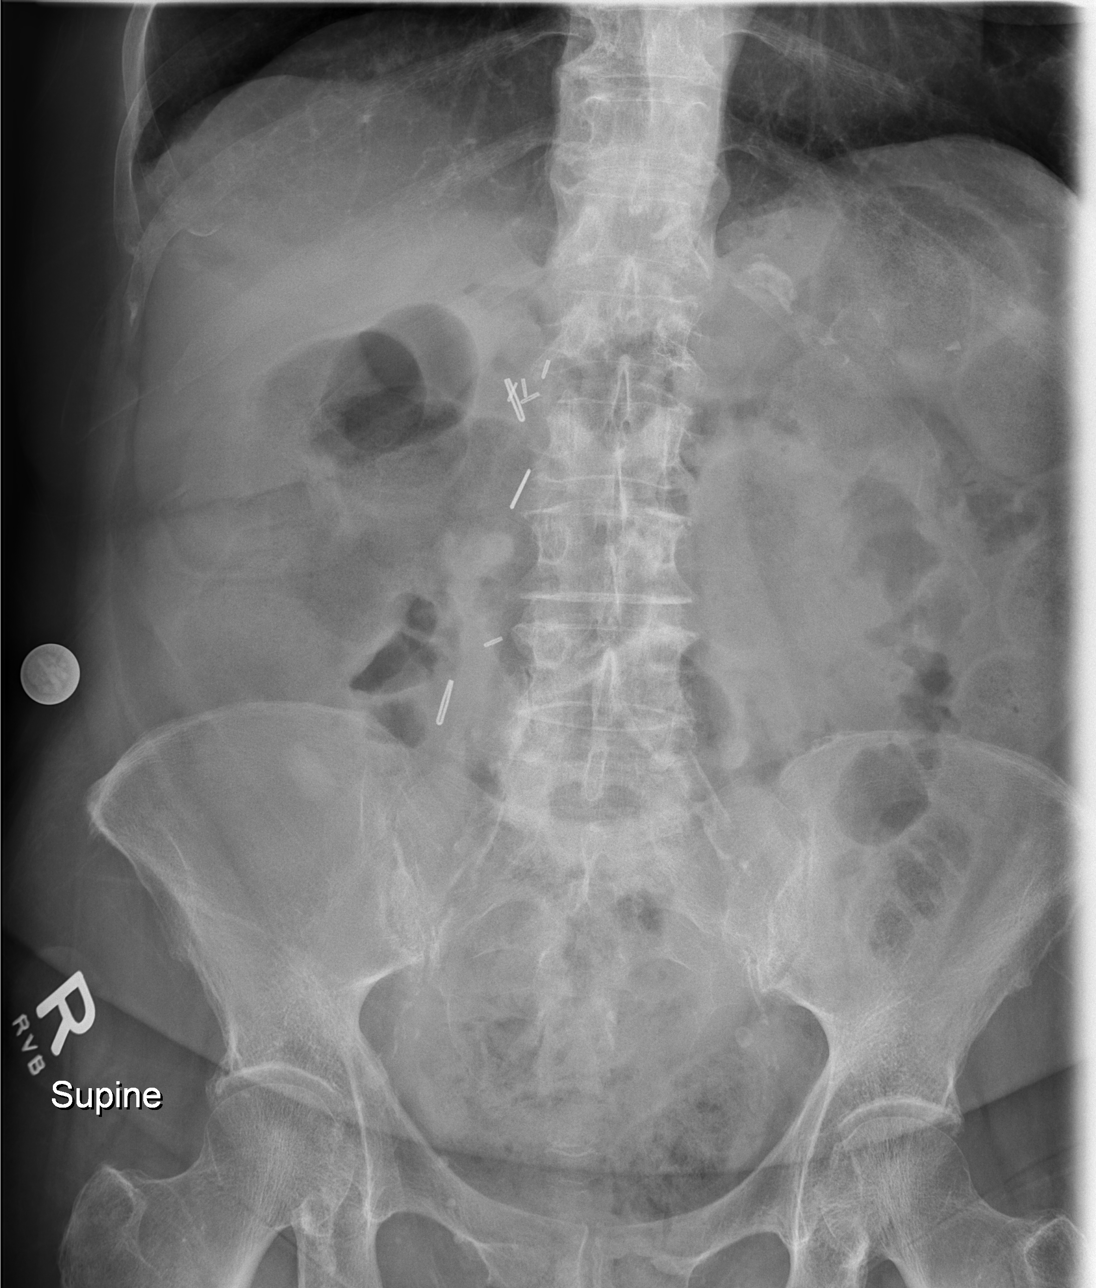

[3 of 3 positions shown; findings below may reference images not displayed]

FINDINGS: The lungs are well-aerated and clear. There is no evidence of focal
opacification, pleural effusion or pneumothorax. The
cardiomediastinal silhouette is within normal limits.

The visualized bowel gas pattern is unremarkable. Scattered stool
and air are seen within the colon; there is no evidence of small
bowel dilatation to suggest obstruction. No free intra-abdominal air
is identified on the provided upright view. Scattered clips are
noted overlying the right side of the abdomen.

No acute osseous abnormalities are seen; the sacroiliac joints are
unremarkable in appearance.
IMPRESSION: 1. Unremarkable bowel gas pattern; no free intra-abdominal air seen.
Small amount of stool noted in the colon.
2. No acute cardiopulmonary process seen.

## 2018-03-27 ENCOUNTER — Encounter: Payer: Self-pay | Admitting: Physician Assistant

## 2018-03-30 ENCOUNTER — Ambulatory Visit (INDEPENDENT_AMBULATORY_CARE_PROVIDER_SITE_OTHER): Payer: Medicare Other | Admitting: Psychiatry

## 2018-03-30 ENCOUNTER — Encounter (HOSPITAL_COMMUNITY): Payer: Self-pay | Admitting: Psychiatry

## 2018-03-30 VITALS — BP 136/88 | HR 100 | Ht 61.0 in | Wt 125.4 lb

## 2018-03-30 DIAGNOSIS — F5102 Adjustment insomnia: Secondary | ICD-10-CM | POA: Diagnosis not present

## 2018-03-30 DIAGNOSIS — Z87891 Personal history of nicotine dependence: Secondary | ICD-10-CM | POA: Diagnosis not present

## 2018-03-30 MED ORDER — ZOLPIDEM TARTRATE ER 12.5 MG PO TBCR: 12.5000 mg | EXTENDED_RELEASE_TABLET | Freq: Every evening | ORAL | 4 refills | Status: DC | PRN

## 2018-03-30 NOTE — Progress Notes (Signed)
Psychiatric Initial Adult Assessment   Patient Identification: Gabrielle [name] MRN:  [id number] Date of Evaluation:  03/30/2018 Referral Source: Dr. [name] Chief Complaint:   Visit Diagnosis: No diagnosis found. Today the patient is seen with her daughter Gabrielle [name] who she lives with. The patient is doing better. She is having less depression. Yet on the other hand her daughter reports that the patient often yells out her husband say whether daily basis. 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 Often therefore they are not home. Overall the patient sleeps fairly well as long she takes 12.5 CR. She has a few times she wakes up she goes right back she takes no naps and does not appear sleepy. The patient does enjoy watching TV listening to soaks dangers. She claims she's eating well has good concentration and overall is happy with life. She misses her husband Gabrielle [name] a great deal. Today the patient agreed that she might like to talk about the death of her husband and agreed to take the telephone number to hospice. At this time she does not drink any alcohol or use any drugs. He demonstrates no evidence of psychosis. She stays active. She's not had any falls. She denies chest pain shortness of breath or any neurological symptoms (Hypo) Manic Symptoms:   Anxiety Symptoms:   Psychotic Symptoms:   PTSD Symptoms:   Past Psychiatric History: Negative  Previous Psychotropic Medications: Yes   Substance Abuse History in the last 12 months:  No.  Consequences of Substance Abuse: NA  Past Medical History:  Past Medical History:  Diagnosis Date  . Anxiety   . Arthritis   . Azotemia   . Carotid artery disease ([place])   . Claudication ([place])   . CVA (cerebral vascular accident) ([place])    [place] DIZZINESS 2008  . Depression   . Diabetes mellitus    1990  . Essential hypertension, benign   . History of anemia   . History of gout   . History of kidney  stones   . Hyperlipidemia   . Neck pain   . PAD (peripheral artery disease) (HCC)    hx. LCEA, hx Lt renal art. stenting, occl rt renal artery, occluded bil SFAs, moderatie iliac disease,    . Renal cell cancer ([place]) 2001  . Seizure [facility]     Past Surgical History:  Procedure Laterality Date  . CAROTID ENDARTERECTOMY     LEFT  . INCISION AND DRAINAGE ABSCESS Right 10/26/2017   Procedure: INCISION AND DRAINAGE HEMATOMA RIGHT SHIN;  Surgeon: Georganna Skeans, MD;  Location: [facility];  Service: General;  Laterality: Right;  . NEPHRECTOMY     RIGHT  . NM MYOCAR PERF WALL MOTION  07/12/2011   normal  . PV angiogram  2004   Lt renal artery stent  . TONSILLECTOMY    . URETERAL STENT PLACEMENT     LEFT    Family Psychiatric History:   Family History:  Family History  Problem Relation Age of Onset  . Cancer Mother   . Heart disease Father     Social History:   Social History   Socioeconomic History  . Marital status: Married    Spouse name: Not on file  . Number of children: 2  . Years of education: Not on file  . Highest education level: Not on file  Occupational History  . Occupation: Retired    Fish farm manager: RETIRED  Social Needs  . Emergency planning/management officer  strain: Not on file  . Food insecurity:    Worry: Not on file    Inability: Not on file  . Transportation needs:    Medical: Not on file    Non-medical: Not on file  Tobacco Use  . Smoking status: Former Smoker    Last attempt to quit: 12/26/2005    Years since quitting: 12.2  . Smokeless tobacco: Never Used  Substance and Sexual Activity  . Alcohol use: No  . Drug use: No  . Sexual activity: Never    Birth control/protection: Abstinence  Lifestyle  . Physical activity:    Days per week: Not on file    Minutes per session: Not on file  . Stress: Not on file  Relationships  . Social connections:    Talks on phone: Not on file    Gets together: Not on file    Attends religious service: Not on file    Active member  of club or organization: Not on file    Attends meetings of clubs or organizations: Not on file    Relationship status: Not on file  Other Topics Concern  . Not on file  Social History Narrative  . Not on file    Additional Social History:   Allergies:   Allergies  Allergen Reactions  . Amlodipine Itching  . Codeine Nausea And Vomiting  . Coreg [Carvedilol] Nausea And Vomiting  . Fentanyl   . Lacosamide      Other name is, VIMPAT  . Levetiracetam Other (See Comments)    Strange feelings in head  . Metformin And Related   . Metoprolol   . Morphine And Related Itching  . Oxybutynin Itching  . Sertraline Nausea Only and Other (See Comments)    Swelling in mouth  . Tessalon [Benzonatate]   . Other Rash    BETA BLOCKER    Metabolic Disorder Labs: Lab Results  Component Value Date   HGBA1C 6.2 (H) 09/25/2017   MPG 128 (H) 07/30/2010   No results found for: PROLACTIN Lab Results  Component Value Date   CHOL 186 09/25/2017   TRIG 124 09/25/2017   HDL 43 09/25/2017   CHOLHDL 4.3 09/25/2017   VLDL 25 05/03/2016   LDLCALC 118 (H) 09/25/2017   LDLCALC 59 05/03/2016     Current Medications: Current Outpatient Medications  Medication Sig Dispense Refill  . aspirin 81 MG tablet Take 81 mg by mouth daily.    . clopidogrel (PLAVIX) 75 MG tablet Take 1 tablet (75 mg total) by mouth daily. 90 tablet 3  . ferrous sulfate (SLOW FE) 160 (50 Fe) MG TBCR SR tablet Take 1 tablet (160 mg total) by mouth daily. 30 each 3  . glipiZIDE (GLUCOTROL) 5 MG tablet Take 0.5 tablets (2.5 mg total) by mouth daily before breakfast. 45 tablet 1  . lisinopril (PRINIVIL,ZESTRIL) 40 MG tablet TAKE 1 TABLET BY MOUTH DAILY 90 tablet 0  . traMADol (ULTRAM) 50 MG tablet Take 1-2 tablets (50-100 mg total) by mouth every 6 (six) hours as needed for moderate pain or severe pain. 20 tablet 0  . zolpidem (AMBIEN CR) 12.5 MG CR tablet Take 1 tablet (12.5 mg total) by mouth at bedtime as needed for sleep. 30  tablet 4   No current facility-administered medications for this visit.     Neurologic: Headache: No Seizure: No Paresthesias:NA  Musculoskeletal: Strength & Muscle Tone: Normal Gait & Station: Normal Patient leans: N/A  Psychiatric Specialty Exam: ROS  Blood pressure 136/88,  pulse 100, height 5\' 1"  (1.549 m), weight 125 lb 6.4 oz (56.9 kg).Body mass index is 23.69 kg/m.  General Appearance: Well Groomed  Eye Contact:  Good  Speech:  Clear and Coherent  Volume:  Normal  Mood:  Euthymic  Affect:  Appropriate  Thought Process:  Goal Directed  Orientation:  NA  Thought Content:  Logical  Suicidal Thoughts:  No  Homicidal Thoughts:  No  Memory:  NA  Judgement:  Good  Insight:  Good  Psychomotor Activity:  Normal  Concentration:  Concentration: Good  Recall:  Good  Fund of Knowledge:Good  Language: Negative  Akathisia:  No  Handed:  Right  AIMS (if indicated):    Assets:  Desire for Improvement  ADL's:  Intact  Cognition: WNL  Sleep:  Abnormal , problems maintaining     Treatment Plan Summary: Medication management At this time the patient will continue taking Ambien 12.5 CR medically she is very stable. She denies daily persistent depression. She still misses her husband and she's agreed to make contact with hospice to talk about. The patient is functioning very well. She'll return to see me in 4 months for just a 15 minute checking. At this time I only contribution history prescribed Ambien which she's been on for years. He gets her good night sleep.

## 2018-04-23 ENCOUNTER — Other Ambulatory Visit: Payer: Self-pay | Admitting: Family Medicine

## 2018-04-23 DIAGNOSIS — I10 Essential (primary) hypertension: Secondary | ICD-10-CM

## 2018-04-27 DIAGNOSIS — I10 Essential (primary) hypertension: Secondary | ICD-10-CM | POA: Diagnosis not present

## 2018-04-27 DIAGNOSIS — E7800 Pure hypercholesterolemia, unspecified: Secondary | ICD-10-CM | POA: Diagnosis not present

## 2018-04-27 DIAGNOSIS — R200 Anesthesia of skin: Secondary | ICD-10-CM | POA: Diagnosis not present

## 2018-04-27 DIAGNOSIS — N3281 Overactive bladder: Secondary | ICD-10-CM | POA: Diagnosis not present

## 2018-04-27 DIAGNOSIS — G4762 Sleep related leg cramps: Secondary | ICD-10-CM | POA: Diagnosis not present

## 2018-04-27 DIAGNOSIS — E119 Type 2 diabetes mellitus without complications: Secondary | ICD-10-CM | POA: Diagnosis not present

## 2018-05-23 ENCOUNTER — Encounter: Payer: Self-pay | Admitting: Family Medicine

## 2018-05-23 ENCOUNTER — Other Ambulatory Visit: Payer: Self-pay | Admitting: Family Medicine

## 2018-05-23 DIAGNOSIS — E1122 Type 2 diabetes mellitus with diabetic chronic kidney disease: Secondary | ICD-10-CM

## 2018-05-23 DIAGNOSIS — N184 Chronic kidney disease, stage 4 (severe): Principal | ICD-10-CM

## 2018-05-25 DIAGNOSIS — I10 Essential (primary) hypertension: Secondary | ICD-10-CM | POA: Diagnosis not present

## 2018-05-25 DIAGNOSIS — R4189 Other symptoms and signs involving cognitive functions and awareness: Secondary | ICD-10-CM | POA: Diagnosis not present

## 2018-06-01 DIAGNOSIS — I10 Essential (primary) hypertension: Secondary | ICD-10-CM | POA: Diagnosis not present

## 2018-06-01 DIAGNOSIS — Z639 Problem related to primary support group, unspecified: Secondary | ICD-10-CM | POA: Diagnosis not present

## 2018-06-01 DIAGNOSIS — F4325 Adjustment disorder with mixed disturbance of emotions and conduct: Secondary | ICD-10-CM | POA: Diagnosis not present

## 2018-06-12 DIAGNOSIS — Z7901 Long term (current) use of anticoagulants: Secondary | ICD-10-CM | POA: Diagnosis not present

## 2018-06-12 DIAGNOSIS — F4325 Adjustment disorder with mixed disturbance of emotions and conduct: Secondary | ICD-10-CM | POA: Diagnosis not present

## 2018-06-15 DIAGNOSIS — F331 Major depressive disorder, recurrent, moderate: Secondary | ICD-10-CM | POA: Diagnosis not present

## 2018-06-21 ENCOUNTER — Other Ambulatory Visit: Payer: Self-pay | Admitting: Family Medicine

## 2018-06-21 DIAGNOSIS — N184 Chronic kidney disease, stage 4 (severe): Principal | ICD-10-CM

## 2018-06-21 DIAGNOSIS — E1122 Type 2 diabetes mellitus with diabetic chronic kidney disease: Secondary | ICD-10-CM

## 2018-06-29 DIAGNOSIS — F4325 Adjustment disorder with mixed disturbance of emotions and conduct: Secondary | ICD-10-CM | POA: Diagnosis not present

## 2018-07-10 DIAGNOSIS — N179 Acute kidney failure, unspecified: Secondary | ICD-10-CM | POA: Diagnosis not present

## 2018-07-10 DIAGNOSIS — R252 Cramp and spasm: Secondary | ICD-10-CM | POA: Diagnosis not present

## 2018-07-10 DIAGNOSIS — R51 Headache: Secondary | ICD-10-CM | POA: Diagnosis not present

## 2018-07-10 DIAGNOSIS — N189 Chronic kidney disease, unspecified: Secondary | ICD-10-CM | POA: Diagnosis not present

## 2018-07-10 DIAGNOSIS — I214 Non-ST elevation (NSTEMI) myocardial infarction: Secondary | ICD-10-CM | POA: Diagnosis not present

## 2018-07-10 DIAGNOSIS — I129 Hypertensive chronic kidney disease with stage 1 through stage 4 chronic kidney disease, or unspecified chronic kidney disease: Secondary | ICD-10-CM | POA: Diagnosis not present

## 2018-07-10 DIAGNOSIS — E162 Hypoglycemia, unspecified: Secondary | ICD-10-CM | POA: Diagnosis not present

## 2018-07-10 DIAGNOSIS — R1110 Vomiting, unspecified: Secondary | ICD-10-CM | POA: Diagnosis not present

## 2018-07-10 DIAGNOSIS — Z7902 Long term (current) use of antithrombotics/antiplatelets: Secondary | ICD-10-CM | POA: Diagnosis not present

## 2018-07-10 DIAGNOSIS — R410 Disorientation, unspecified: Secondary | ICD-10-CM | POA: Diagnosis not present

## 2018-07-10 DIAGNOSIS — R531 Weakness: Secondary | ICD-10-CM | POA: Diagnosis not present

## 2018-07-10 DIAGNOSIS — R197 Diarrhea, unspecified: Secondary | ICD-10-CM | POA: Diagnosis not present

## 2018-07-10 DIAGNOSIS — Z8673 Personal history of transient ischemic attack (TIA), and cerebral infarction without residual deficits: Secondary | ICD-10-CM | POA: Diagnosis not present

## 2018-07-10 DIAGNOSIS — I700 Atherosclerosis of aorta: Secondary | ICD-10-CM | POA: Diagnosis not present

## 2018-07-10 DIAGNOSIS — Z885 Allergy status to narcotic agent status: Secondary | ICD-10-CM | POA: Diagnosis not present

## 2018-07-10 DIAGNOSIS — H9319 Tinnitus, unspecified ear: Secondary | ICD-10-CM | POA: Diagnosis not present

## 2018-07-10 DIAGNOSIS — R200 Anesthesia of skin: Secondary | ICD-10-CM | POA: Diagnosis not present

## 2018-07-10 DIAGNOSIS — Z87891 Personal history of nicotine dependence: Secondary | ICD-10-CM | POA: Diagnosis not present

## 2018-07-10 DIAGNOSIS — Z79899 Other long term (current) drug therapy: Secondary | ICD-10-CM | POA: Diagnosis not present

## 2018-07-11 ENCOUNTER — Inpatient Hospital Stay (HOSPITAL_COMMUNITY): Payer: Medicare Other

## 2018-07-11 ENCOUNTER — Inpatient Hospital Stay (HOSPITAL_COMMUNITY)
Admission: AD | Admit: 2018-07-11 | Discharge: 2018-07-19 | DRG: 246 | Disposition: A | Discharge status: Skilled Nursing Facility | Payer: Medicare Other | Source: Other Acute Inpatient Hospital | Attending: Internal Medicine | Admitting: Internal Medicine

## 2018-07-11 DIAGNOSIS — G4700 Insomnia, unspecified: Secondary | ICD-10-CM | POA: Diagnosis not present

## 2018-07-11 DIAGNOSIS — R0602 Shortness of breath: Secondary | ICD-10-CM | POA: Diagnosis not present

## 2018-07-11 DIAGNOSIS — Z85528 Personal history of other malignant neoplasm of kidney: Secondary | ICD-10-CM | POA: Diagnosis not present

## 2018-07-11 DIAGNOSIS — E860 Dehydration: Secondary | ICD-10-CM | POA: Diagnosis present

## 2018-07-11 DIAGNOSIS — E1122 Type 2 diabetes mellitus with diabetic chronic kidney disease: Secondary | ICD-10-CM | POA: Diagnosis present

## 2018-07-11 DIAGNOSIS — N184 Chronic kidney disease, stage 4 (severe): Secondary | ICD-10-CM | POA: Diagnosis not present

## 2018-07-11 DIAGNOSIS — N189 Chronic kidney disease, unspecified: Secondary | ICD-10-CM

## 2018-07-11 DIAGNOSIS — R197 Diarrhea, unspecified: Secondary | ICD-10-CM | POA: Diagnosis not present

## 2018-07-11 DIAGNOSIS — E11649 Type 2 diabetes mellitus with hypoglycemia without coma: Secondary | ICD-10-CM | POA: Diagnosis not present

## 2018-07-11 DIAGNOSIS — N179 Acute kidney failure, unspecified: Secondary | ICD-10-CM | POA: Diagnosis present

## 2018-07-11 DIAGNOSIS — Z66 Do not resuscitate: Secondary | ICD-10-CM | POA: Diagnosis present

## 2018-07-11 DIAGNOSIS — I639 Cerebral infarction, unspecified: Secondary | ICD-10-CM | POA: Diagnosis not present

## 2018-07-11 DIAGNOSIS — Z885 Allergy status to narcotic agent status: Secondary | ICD-10-CM | POA: Diagnosis not present

## 2018-07-11 DIAGNOSIS — N181 Chronic kidney disease, stage 1: Secondary | ICD-10-CM | POA: Diagnosis not present

## 2018-07-11 DIAGNOSIS — M5431 Sciatica, right side: Secondary | ICD-10-CM | POA: Diagnosis present

## 2018-07-11 DIAGNOSIS — Z7902 Long term (current) use of antithrombotics/antiplatelets: Secondary | ICD-10-CM | POA: Diagnosis not present

## 2018-07-11 DIAGNOSIS — Z7982 Long term (current) use of aspirin: Secondary | ICD-10-CM

## 2018-07-11 DIAGNOSIS — Z79899 Other long term (current) drug therapy: Secondary | ICD-10-CM

## 2018-07-11 DIAGNOSIS — I10 Essential (primary) hypertension: Secondary | ICD-10-CM | POA: Diagnosis not present

## 2018-07-11 DIAGNOSIS — E1151 Type 2 diabetes mellitus with diabetic peripheral angiopathy without gangrene: Secondary | ICD-10-CM | POA: Diagnosis present

## 2018-07-11 DIAGNOSIS — I25110 Atherosclerotic heart disease of native coronary artery with unstable angina pectoris: Secondary | ICD-10-CM | POA: Diagnosis not present

## 2018-07-11 DIAGNOSIS — Z888 Allergy status to other drugs, medicaments and biological substances status: Secondary | ICD-10-CM

## 2018-07-11 DIAGNOSIS — I130 Hypertensive heart and chronic kidney disease with heart failure and stage 1 through stage 4 chronic kidney disease, or unspecified chronic kidney disease: Secondary | ICD-10-CM | POA: Diagnosis present

## 2018-07-11 DIAGNOSIS — R079 Chest pain, unspecified: Secondary | ICD-10-CM

## 2018-07-11 DIAGNOSIS — M160 Bilateral primary osteoarthritis of hip: Secondary | ICD-10-CM | POA: Diagnosis not present

## 2018-07-11 DIAGNOSIS — I5030 Unspecified diastolic (congestive) heart failure: Secondary | ICD-10-CM | POA: Diagnosis not present

## 2018-07-11 DIAGNOSIS — F09 Unspecified mental disorder due to known physiological condition: Secondary | ICD-10-CM | POA: Diagnosis present

## 2018-07-11 DIAGNOSIS — I2510 Atherosclerotic heart disease of native coronary artery without angina pectoris: Secondary | ICD-10-CM | POA: Diagnosis not present

## 2018-07-11 DIAGNOSIS — Z87891 Personal history of nicotine dependence: Secondary | ICD-10-CM

## 2018-07-11 DIAGNOSIS — R112 Nausea with vomiting, unspecified: Secondary | ICD-10-CM | POA: Diagnosis not present

## 2018-07-11 DIAGNOSIS — Z905 Acquired absence of kidney: Secondary | ICD-10-CM | POA: Diagnosis not present

## 2018-07-11 DIAGNOSIS — D509 Iron deficiency anemia, unspecified: Secondary | ICD-10-CM | POA: Diagnosis present

## 2018-07-11 DIAGNOSIS — N170 Acute kidney failure with tubular necrosis: Secondary | ICD-10-CM | POA: Diagnosis not present

## 2018-07-11 DIAGNOSIS — D649 Anemia, unspecified: Secondary | ICD-10-CM | POA: Diagnosis not present

## 2018-07-11 DIAGNOSIS — R29701 NIHSS score 1: Secondary | ICD-10-CM | POA: Diagnosis not present

## 2018-07-11 DIAGNOSIS — R279 Unspecified lack of coordination: Secondary | ICD-10-CM | POA: Diagnosis not present

## 2018-07-11 DIAGNOSIS — F0390 Unspecified dementia without behavioral disturbance: Secondary | ICD-10-CM | POA: Diagnosis present

## 2018-07-11 DIAGNOSIS — I214 Non-ST elevation (NSTEMI) myocardial infarction: Secondary | ICD-10-CM | POA: Diagnosis present

## 2018-07-11 DIAGNOSIS — Z955 Presence of coronary angioplasty implant and graft: Secondary | ICD-10-CM

## 2018-07-11 DIAGNOSIS — I5032 Chronic diastolic (congestive) heart failure: Secondary | ICD-10-CM | POA: Diagnosis present

## 2018-07-11 DIAGNOSIS — I2584 Coronary atherosclerosis due to calcified coronary lesion: Secondary | ICD-10-CM | POA: Diagnosis present

## 2018-07-11 DIAGNOSIS — E785 Hyperlipidemia, unspecified: Secondary | ICD-10-CM | POA: Diagnosis present

## 2018-07-11 DIAGNOSIS — R52 Pain, unspecified: Secondary | ICD-10-CM | POA: Diagnosis not present

## 2018-07-11 DIAGNOSIS — I129 Hypertensive chronic kidney disease with stage 1 through stage 4 chronic kidney disease, or unspecified chronic kidney disease: Secondary | ICD-10-CM | POA: Diagnosis not present

## 2018-07-11 DIAGNOSIS — D631 Anemia in chronic kidney disease: Secondary | ICD-10-CM | POA: Diagnosis present

## 2018-07-11 DIAGNOSIS — I959 Hypotension, unspecified: Secondary | ICD-10-CM | POA: Diagnosis present

## 2018-07-11 DIAGNOSIS — Z111 Encounter for screening for respiratory tuberculosis: Secondary | ICD-10-CM | POA: Diagnosis not present

## 2018-07-11 DIAGNOSIS — N183 Chronic kidney disease, stage 3 (moderate): Secondary | ICD-10-CM | POA: Diagnosis not present

## 2018-07-11 DIAGNOSIS — I2119 ST elevation (STEMI) myocardial infarction involving other coronary artery of inferior wall: Principal | ICD-10-CM | POA: Diagnosis present

## 2018-07-11 DIAGNOSIS — M25559 Pain in unspecified hip: Secondary | ICD-10-CM

## 2018-07-11 DIAGNOSIS — K5900 Constipation, unspecified: Secondary | ICD-10-CM | POA: Diagnosis not present

## 2018-07-11 DIAGNOSIS — R531 Weakness: Secondary | ICD-10-CM | POA: Diagnosis not present

## 2018-07-11 DIAGNOSIS — Z743 Need for continuous supervision: Secondary | ICD-10-CM | POA: Diagnosis not present

## 2018-07-11 DIAGNOSIS — Z8673 Personal history of transient ischemic attack (TIA), and cerebral infarction without residual deficits: Secondary | ICD-10-CM

## 2018-07-11 HISTORY — DX: Atherosclerotic heart disease of native coronary artery without angina pectoris: I25.10

## 2018-07-11 HISTORY — DX: Migraine, unspecified, not intractable, without status migrainosus: G43.909

## 2018-07-11 HISTORY — DX: Anemia, unspecified: D64.9

## 2018-07-11 HISTORY — DX: Type 2 diabetes mellitus without complications: E11.9

## 2018-07-11 HISTORY — DX: Non-ST elevation (NSTEMI) myocardial infarction: I21.4

## 2018-07-11 LAB — CBC WITH DIFFERENTIAL/PLATELET
Abs Immature Granulocytes: 0.1 10*3/uL (ref 0.0–0.1)
Basophils Absolute: 0.1 10*3/uL (ref 0.0–0.1)
Basophils Relative: 1 %
Eosinophils Absolute: 0.1 10*3/uL (ref 0.0–0.7)
Eosinophils Relative: 2 %
HCT: 28.7 % — ABNORMAL LOW (ref 36.0–46.0)
Hemoglobin: 9.1 g/dL — ABNORMAL LOW (ref 12.0–15.0)
Immature Granulocytes: 1 %
Lymphocytes Relative: 12 %
Lymphs Abs: 1 10*3/uL (ref 0.7–4.0)
MCH: 26.8 pg (ref 26.0–34.0)
MCHC: 31.7 g/dL (ref 30.0–36.0)
MCV: 84.7 fL (ref 78.0–100.0)
Monocytes Absolute: 1.1 10*3/uL — ABNORMAL HIGH (ref 0.1–1.0)
Monocytes Relative: 14 %
Neutro Abs: 5.5 10*3/uL (ref 1.7–7.7)
Neutrophils Relative %: 70 %
Platelets: 365 10*3/uL (ref 150–400)
RBC: 3.39 MIL/uL — ABNORMAL LOW (ref 3.87–5.11)
RDW: 15.9 % — ABNORMAL HIGH (ref 11.5–15.5)
WBC: 7.8 10*3/uL (ref 4.0–10.5)

## 2018-07-11 LAB — COMPREHENSIVE METABOLIC PANEL
ALT: 14 U/L (ref 0–44)
AST: 21 U/L (ref 15–41)
Albumin: 3 g/dL — ABNORMAL LOW (ref 3.5–5.0)
Alkaline Phosphatase: 49 U/L (ref 38–126)
Anion gap: 13 (ref 5–15)
BUN: 58 mg/dL — ABNORMAL HIGH (ref 8–23)
CO2: 20 mmol/L — ABNORMAL LOW (ref 22–32)
Calcium: 8 mg/dL — ABNORMAL LOW (ref 8.9–10.3)
Chloride: 101 mmol/L (ref 98–111)
Creatinine, Ser: 2.77 mg/dL — ABNORMAL HIGH (ref 0.44–1.00)
GFR calc Af Amer: 18 mL/min — ABNORMAL LOW (ref >60)
GFR calc non Af Amer: 15 mL/min — ABNORMAL LOW (ref >60)
Glucose, Bld: 106 mg/dL — ABNORMAL HIGH (ref 70–99)
Potassium: 3.6 mmol/L (ref 3.5–5.1)
Sodium: 134 mmol/L — ABNORMAL LOW (ref 135–145)
Total Bilirubin: 0.5 mg/dL (ref 0.3–1.2)
Total Protein: 5.7 g/dL — ABNORMAL LOW (ref 6.5–8.1)

## 2018-07-11 LAB — ECHOCARDIOGRAM COMPLETE
Height: 61 in
Weight: 1975.32 oz

## 2018-07-11 LAB — TROPONIN I
Troponin I: 4.49 ng/mL (ref <0.03)
Troponin I: 4.36 ng/mL (ref <0.03)
Troponin I: 3.99 ng/mL (ref <0.03)

## 2018-07-11 LAB — GLUCOSE, CAPILLARY
Glucose-Capillary: 58 mg/dL — ABNORMAL LOW (ref 70–99)
Glucose-Capillary: 107 mg/dL — ABNORMAL HIGH (ref 70–99)
Glucose-Capillary: 111 mg/dL — ABNORMAL HIGH (ref 70–99)
Glucose-Capillary: 127 mg/dL — ABNORMAL HIGH (ref 70–99)
Glucose-Capillary: 184 mg/dL — ABNORMAL HIGH (ref 70–99)
Glucose-Capillary: 168 mg/dL — ABNORMAL HIGH (ref 70–99)

## 2018-07-11 LAB — HEPARIN LEVEL (UNFRACTIONATED)
Heparin Unfractionated: 0.6 IU/mL (ref 0.30–0.70)
Heparin Unfractionated: 0.51 IU/mL (ref 0.30–0.70)

## 2018-07-11 LAB — MRSA PCR SCREENING: MRSA by PCR: NEGATIVE

## 2018-07-11 MED ORDER — ASPIRIN EC 81 MG PO TBEC
81.0000 mg | DELAYED_RELEASE_TABLET | Freq: Every day | ORAL | Status: DC
  Administered 2018-07-11 – 2018-07-19 (×8): 81 mg via ORAL
  Filled 2018-07-11 (×9): qty 1

## 2018-07-11 MED ORDER — ATORVASTATIN CALCIUM 40 MG PO TABS
40.0000 mg | ORAL_TABLET | Freq: Every day | ORAL | Status: DC
  Administered 2018-07-11 – 2018-07-18 (×8): 40 mg via ORAL
  Filled 2018-07-11 (×9): qty 1

## 2018-07-11 MED ORDER — HEPARIN (PORCINE) IN NACL 100-0.45 UNIT/ML-% IJ SOLN
750.0000 [IU]/h | INTRAMUSCULAR | Status: DC
  Administered 2018-07-11 – 2018-07-12 (×2): 650 [IU]/h via INTRAVENOUS
  Filled 2018-07-11: qty 250

## 2018-07-11 MED ORDER — INSULIN ASPART 100 UNIT/ML ~~LOC~~ SOLN
0.0000 [IU] | Freq: Three times a day (TID) | SUBCUTANEOUS | Status: DC
  Administered 2018-07-11: 2 [IU] via SUBCUTANEOUS

## 2018-07-11 MED ORDER — HEPARIN SODIUM (PORCINE) 1000 UNIT/ML IJ SOLN
2000.00 | INTRAMUSCULAR | Status: DC
Start: ? — End: 2018-07-11

## 2018-07-11 MED ORDER — HYDROXYZINE HCL 25 MG PO TABS
25.0000 mg | ORAL_TABLET | Freq: Once | ORAL | Status: AC
  Administered 2018-07-11: 25 mg via ORAL
  Filled 2018-07-11: qty 1

## 2018-07-11 MED ORDER — DEXTROSE-NACL 5-0.45 % IV SOLN
INTRAVENOUS | Status: DC
  Administered 2018-07-11 – 2018-07-12 (×3): via INTRAVENOUS

## 2018-07-11 MED ORDER — ONDANSETRON HCL 4 MG/2ML IJ SOLN: 4.0000 mg | Freq: Four times a day (QID) | INTRAMUSCULAR | Status: DC | PRN

## 2018-07-11 MED ORDER — CARVEDILOL 3.125 MG PO TABS
3.1250 mg | ORAL_TABLET | Freq: Two times a day (BID) | ORAL | Status: DC
Start: 2018-07-11 — End: 2018-07-19
  Administered 2018-07-11 – 2018-07-19 (×16): 3.125 mg via ORAL
  Filled 2018-07-11 (×16): qty 1

## 2018-07-11 MED ORDER — HEPARIN (PORCINE) IN NACL 100-0.45 UNIT/ML-% IJ SOLN
12.00 | INTRAMUSCULAR | Status: DC
Start: ? — End: 2018-07-11

## 2018-07-11 MED ORDER — HYDROXYZINE HCL 50 MG/ML IM SOLN: 25.0000 mg | Freq: Four times a day (QID) | INTRAMUSCULAR | Status: DC | PRN

## 2018-07-11 MED ORDER — SODIUM CHLORIDE 0.9 % IV SOLN: INTRAVENOUS | Status: DC

## 2018-07-11 MED ORDER — NITROGLYCERIN 0.4 MG SL SUBL: 0.4000 mg | SUBLINGUAL_TABLET | SUBLINGUAL | Status: DC | PRN

## 2018-07-11 MED ORDER — CLOPIDOGREL BISULFATE 75 MG PO TABS
75.0000 mg | ORAL_TABLET | Freq: Every day | ORAL | Status: DC
  Administered 2018-07-11 – 2018-07-19 (×9): 75 mg via ORAL
  Filled 2018-07-11 (×10): qty 1

## 2018-07-11 MED ORDER — ACETAMINOPHEN 325 MG PO TABS: 650.0000 mg | ORAL_TABLET | ORAL | Status: DC | PRN

## 2018-07-11 NOTE — Progress Notes (Signed)
PROGRESS NOTE    Gabrielle [name]  DSK:[id number] DOB: August 08, 1935 DOA: 07/11/2018 PCP: Forrest Moron, MD    Brief Narrative:  83 year old female who presented with chest pressure.  She does have a significant past medical history for diastolic heart failure, renal cell carcinoma status post nephrectomy, chronic kidney disease and history of CVA.  3 days history of nausea, vomiting, decreased p.o. intake and 24 hours of diarrhea.  On the day of admission she developed chest pressure, moderate in intensity, lasting for about an hour with no radiation, associated with dyspnea.  She is able to do her routine activities without angina.  Had a stress test about a year ago, she has been of air conditioning for the last 3 days, she lost her husband about a year ago.  Initial physical examination blood pressure 144/52, heart rate 68, respirate 14, temperature 98.3, oxygen saturation 100%.  Lungs clear to auscultation bilaterally, heart S1-S2 present and rhythmic, abdomen soft nontender, no lower extremity edema.  Sodium 134, potassium 3.6, chloride 101, bicarb 20, glucose 106, BUN 58, creatinine 2.7, troponin 4.49, white count 7.8, hemoglobin 9.1, hematocrit 28.7, platelets 365.  Troponin I 4.49/ 4.36.  EKG with inferior lateral ST depressions.  Patient was admitted to the hospital with working diagnosis of non-ST elevation myocardial infarction.   Assessment & Plan:   Principal Problem:   NSTEMI (non-ST elevated myocardial infarction) ([place]) Active Problems:   Type 2 diabetes mellitus with hypoglycemia without coma (HCC)   Acute renal failure superimposed on chronic kidney disease ([place])   Anemia due to chronic kidney disease   Nausea, vomiting, and diarrhea  1.  Non-ST elevation myocardial infarction (inferior lateral ST depression). Will continue medical therapy with IV heparin, aspirin/ clopidogrel, will add low dose carvedilol and atorvastatin. Unable to use ace inh due to risk of worsening renal  function. No further chest pressure this am, will add as needed nitroglycerin. Will follow on echocardiogram looking for wall motion abnormalities. Will likely need cardiac catheterization and coronary angiography, pending improvement of renal function. Troponin trending down. Will follow EKG this am. Follow on cardiology recommendations.   2. Pre-renal AKI on CKD stage IV (sp nephrectomy). Will continue hydration with saline at 75 ml per hour, will follow on urine output, serum cr and electrolytes. Follow renal US. Avoid hypotension or nephrotoxic agents.  3. HTN. At home on lisinopril, currently on hold due to AKI, placed on low dose carvedilol, will continue blood pressure monitoring.   4. T2DM with hypoglycemia. Will continue glucose cover and monitor with insulin sliding scale, placed on IV dextrose due to hypoglycemia. Currently NPO, will change diet if no invasive procedures today. At home on glipizide.    DVT prophylaxis: IV heparin   Code Status:  full Family Communication: no family at the bedside  Disposition Plan/ discharge barriers: Pending cardiac workup.    Consultants:   Cardiology   Procedures:     Antimicrobials:       Subjective: Patient with no further chest pressure or dyspnea, feeling week and somnolent, no nausea or vomiting, no abdominal pain or further diarrhea.   Objective: Vitals:   07/11/18 0135 07/11/18 0443 07/11/18 0743  BP: (!) 144/52 114/67 (!) 131/46  Pulse: 68 66 69  Resp: 14 14   Temp: 98.3 F (36.8 C) 98.5 F (36.9 C)   TempSrc: Axillary Oral Oral  SpO2: 100% 100% 98%  Weight: 56 kg (123 lb 7.3 oz)    Height: 5\' 1"  (1.549 m)  No intake or output data in the 24 hours ending 07/11/18 0816 Filed Weights   07/11/18 0135  Weight: 56 kg (123 lb 7.3 oz)    Examination:   General: deconditioned  Neurology: Awake and alert, non focal  E ENT: mild pallor, no icterus, oral mucosa moist Cardiovascular: No JVD. S1-S2 present,  rhythmic, no gallops, rubs, or murmurs. No lower extremity edema. Pulmonary: decreased breath sounds bilaterally at bases, adequate air movement, no wheezing, rhonchi or rales. Gastrointestinal. Abdomen with no organomegaly, non tender, no rebound or guarding Skin. No rashes Musculoskeletal: no joint deformities     Data Reviewed: I have personally reviewed following labs and imaging studies  CBC: Recent Labs  Lab 07/11/18 0250  WBC 7.8  NEUTROABS 5.5  HGB 9.1*  HCT 28.7*  MCV 84.7  PLT 706   Basic Metabolic Panel: Recent Labs  Lab 07/11/18 0250  NA 134*  K 3.6  CL 101  CO2 20*  GLUCOSE 106*  BUN 58*  CREATININE 2.77*  CALCIUM 8.0*   GFR: Estimated Creatinine Clearance: 12.2 mL/min (A) (by C-G formula based on SCr of 2.77 mg/dL (H)). Liver Function Tests: Recent Labs  Lab 07/11/18 0250  AST 21  ALT 14  ALKPHOS 49  BILITOT 0.5  PROT 5.7*  ALBUMIN 3.0*   No results for input(s): LIPASE, AMYLASE in the last 168 hours. No results for input(s): AMMONIA in the last 168 hours. Coagulation Profile: No results for input(s): INR, PROTIME in the last 168 hours. Cardiac Enzymes: Recent Labs  Lab 07/11/18 0250 07/11/18 0543  TROPONINI 4.49* 4.36*   BNP (last 3 results) No results for input(s): PROBNP in the last 8760 hours. HbA1C: No results for input(s): HGBA1C in the last 72 hours. CBG: Recent Labs  Lab 07/11/18 0151 07/11/18 0741  GLUCAP 107* 111*   Lipid Profile: No results for input(s): CHOL, HDL, LDLCALC, TRIG, CHOLHDL, LDLDIRECT in the last 72 hours. Thyroid Function Tests: No results for input(s): TSH, T4TOTAL, FREET4, T3FREE, THYROIDAB in the last 72 hours. Anemia Panel: No results for input(s): VITAMINB12, FOLATE, FERRITIN, TIBC, IRON, RETICCTPCT in the last 72 hours.    Radiology Studies: I have reviewed all of the imaging during this hospital visit personally     Scheduled Meds: . aspirin EC  81 mg Oral Daily  . clopidogrel  75 mg  Oral Daily   Continuous Infusions: . dextrose 5 % and 0.45% NaCl 75 mL/hr at 07/11/18 0427  . heparin 650 Units/hr (07/11/18 0316)     LOS: 0 days        Tawni Millers, MD [facility] Pager [phone]

## 2018-07-11 NOTE — Progress Notes (Signed)
  Echocardiogram 2D Echocardiogram has been performed.  Gabrielle [name] 07/11/2018, 11:22 AM

## 2018-07-11 NOTE — Progress Notes (Signed)
ANTICOAGULATION CONSULT NOTE - Initial Consult  Pharmacy Consult for Heparin  Indication: chest pain/ACS  Patient Measurements: Height: 5\' 1"  (154.9 cm) Weight: 123 lb 7.3 oz (56 kg) IBW/kg (Calculated) : 47.8 Vital Signs: Temp: 98.5 F (36.9 C) (07/17 0443) Temp Source: Oral (07/17 0443) BP: 114/67 (07/17 0443) Pulse Rate: 66 (07/17 0443)  Labs: Transfer from outside hospital   Medical History: Past Medical History:  Diagnosis Date  . Anxiety   . Arthritis   . Azotemia   . Carotid artery disease ([place])   . Claudication ([place])   . CVA (cerebral vascular accident) ([place])    [place] DIZZINESS 2008  . Depression   . Diabetes mellitus    1990  . Essential hypertension, benign   . History of anemia   . History of gout   . History of kidney stones   . Hyperlipidemia   . Neck pain   . PAD (peripheral artery disease) (HCC)    hx. LCEA, hx Lt renal art. stenting, occl rt renal artery, occluded bil SFAs, moderatie iliac disease,    . Renal cell cancer ([place]) 2001  . Seizure [facility])    Assessment: 83 y/o F transfer from [facility] with elevated troponin. Heparin infusing at 650 units/hr. Hgb 10.3 per care everywhere.   Goal of Therapy:  Heparin level 0.3-0.7 units/ml Monitor platelets by anticoagulation protocol: Yes   Plan:  Cont heparin at 650 units/hr Check heparin level at 0500  Narda Bonds 07/11/2018,6:59 AM   =============================== Addendum 6:59 AM Heparin level therapeutic x 1 this AM -Cont heparin at 650 units/hr -Confirmatory heparin level at [place], PharmD, BCPS Clinical Pharmacist Phone: [phone] ================================

## 2018-07-11 NOTE — Progress Notes (Signed)
ANTICOAGULATION CONSULT NOTE - Initial Consult  Pharmacy Consult for Heparin  Indication: chest pain/ACS  Patient Measurements: Height: 5\' 1"  (154.9 cm) Weight: 123 lb 7.3 oz (56 kg) IBW/kg (Calculated) : 47.8 Vital Signs: Temp: 98.3 F (36.8 C) (07/17 0135) Temp Source: Axillary (07/17 0135) BP: 144/52 (07/17 0135) Pulse Rate: 68 (07/17 0135)  Labs: Transfer from outside hospital   Medical History: Past Medical History:  Diagnosis Date  . Anxiety   . Arthritis   . Azotemia   . Carotid artery disease ([place])   . Claudication ([place])   . CVA (cerebral vascular accident) ([place])    [place] DIZZINESS 2008  . Depression   . Diabetes mellitus    1990  . Essential hypertension, benign   . History of anemia   . History of gout   . History of kidney stones   . Hyperlipidemia   . Neck pain   . PAD (peripheral artery disease) (HCC)    hx. LCEA, hx Lt renal art. stenting, occl rt renal artery, occluded bil SFAs, moderatie iliac disease,    . Renal cell cancer ([place]) 2001  . [facility]izure [facility])    Assessment: 83 y/o F transfer [facility] with elevated troponin. Heparin infusing at 650 units/hr. Hgb 10.3 per care everywhere.   Goal of Therapy:  Heparin level 0.3-0.7 units/ml Monitor platelets by anticoagulation protocol: Yes   Plan:  Cont heparin at 650 units/hr Check heparin level at 0500  Narda Bonds 07/11/2018,2:44 AM

## 2018-07-11 NOTE — Progress Notes (Signed)
ANTICOAGULATION CONSULT NOTE - Follow Up Consult  Pharmacy Consult for Heparin Indication: chest pain/ACS  Allergies  Allergen Reactions  . Amlodipine Itching  . Codeine Nausea And Vomiting  . Coreg [Carvedilol] Nausea And Vomiting  . Fentanyl Other (See Comments)    unknown  . Lacosamide Other (See Comments)     Other name is, VIMPAT unknown  . Levetiracetam Other (See Comments)    Strange feelings in head  . Metformin And Related Other (See Comments)    unknown  . Metoprolol Other (See Comments)    unknown  . Morphine And Related Itching  . Oxybutynin Swelling    mouth  . Sertraline Nausea Only and Other (See Comments)    Swelling in mouth  . Tessalon [Benzonatate] Other (See Comments)    unknown  . Other Rash    BETA BLOCKER    Patient Measurements: Height: 5\' 1"  (154.9 cm) Weight: 123 lb 7.3 oz (56 kg) IBW/kg (Calculated) : 47.8 Heparin Dosing Weight: 56 kg  Vital Signs: Temp: 99.8 F (37.7 C) (07/17 1203) Temp Source: Oral (07/17 1203) BP: 109/43 (07/17 1203) Pulse Rate: 61 (07/17 1203)  Labs: Recent Labs    07/11/18 0250 07/11/18 0543 07/11/18 0842 07/11/18 1139  HGB 9.1*  --   --   --   HCT 28.7*  --   --   --   PLT 365  --   --   --   HEPARINUNFRC  --  0.60  --  0.51  CREATININE 2.77*  --   --   --   TROPONINI 4.49* 4.36* 3.99*  --     Estimated Creatinine Clearance: 12.2 mL/min (A) (by C-G formula based on SCr of 2.77 mg/dL (H)).   Medications:  Infusions:  . dextrose 5 % and 0.45% NaCl 75 mL/hr at 07/11/18 1200  . heparin 650 Units/hr (07/11/18 1200)    Assessment: 83 year old female who transferred from [place] with elevated troponin on IV Heparin infusion. Pharmacy was consulted to continue Heparin dosing for ACS.   Heparin level is therapeutic x2 on current rate of 650 units/hr.  Hgb 10.3 >>9.1. Platelets are within normal limits.   Goal of Therapy:  Heparin level 0.3-0.7 units/ml Monitor platelets by anticoagulation  protocol: Yes   Plan:  Continue Heparin at current rate of 650 units/hr.  Daily Heparin level and CBC while on therapy.  Sloan Leiter, PharmD, BCPS, BCCCP Clinical Pharmacist Clinical phone 07/11/2018 until 3:30PM- #[phone] Please refer to [facility] and [place] for clinical pharmacy numbers.  07/11/2018,1:30 PM

## 2018-07-11 NOTE — H&P (Signed)
History and Physical    Gabrielle [name] FGH:[id number] DOB: 08/28/1935 DOA: 07/11/2018  Referring MD/NP/PA: Dr. Noberto Retort PCP: Forrest Moron, MD  Patient coming from: Transfer from [facility]  Chief Complaint: Weakness  I have personally briefly reviewed patient's old medical records in [employer]   HPI: Gabrielle [name] is a 83 y.o. female with medical history significant of diastolic dysfunction last EF 65 - 70% with grade 1 diastolic, RCC s/p nephrectomy, CKD, and CVA, and mild cognitive disorder; who presented with complaints of generalized weakness.  Patient had reported not feeling well for several days and had had multiple episodes of nausea with nonbloody emesis for which she was unable to eat anything.  For the last 3 days the air conditioning  where she stays had been broken and it has been extremely warm in the house.  Yesterday morning patient complained of complained of sweating on her forehead, but nowhere else.  She also noted 3-4 episodes of diarrhea, feeling shortness of breath with exertion, and chest tightness/discomfort that started yesterday as well.  Other associated symptoms included chills, urinary frequency, right lower leg cramping, ringing her ears, and generalized malaise.  Denies any cough, wheezing, abdominal pain, leg swelling, or recent sick contacts.  No one in the household had similar symptoms.      Labs revealed WBC 10.9, hemoglobin 10.3, platelets 493, sodium 133, CO2 20, BUN 67, creatinine 2.6, glucose 65, TSH within normal limits, troponin I 5.54, proBNP 2620. Imaging studies included chest x-ray and CT scan of the brain which showed no acute abnormalities.  EKG did show new ST wave depressions.  Heparin drip was started and the patient was given something to eat to improve blood glucose.  Due to the lack of cardiology consultative services transfer was recommended.  ED Course: as seen above  Review of Systems  Constitutional: Positive for  chills, diaphoresis and malaise/fatigue. Negative for fever.  HENT: Positive for tinnitus. Negative for sinus pain.   Eyes: Negative for photophobia and pain.  Respiratory: Positive for shortness of breath. Negative for cough and wheezing.   Cardiovascular: Positive for chest pain. Negative for leg swelling.  Gastrointestinal: Positive for diarrhea, nausea and vomiting. Negative for abdominal pain.  Genitourinary: Positive for frequency and urgency.  Musculoskeletal: Positive for myalgias. Negative for falls.  Neurological: Negative for focal weakness and loss of consciousness.  Psychiatric/Behavioral: Negative for substance abuse. The patient is nervous/anxious.     Past Medical History:  Diagnosis Date  . Anxiety   . Arthritis   . Azotemia   . Carotid artery disease ([place])   . Claudication ([place])   . CVA (cerebral vascular accident) ([place])    [place] DIZZINESS 2008  . Depression   . Diabetes mellitus    1990  . Essential hypertension, benign   . History of anemia   . History of gout   . History of kidney stones   . Hyperlipidemia   . Neck pain   . PAD (peripheral artery disease) (HCC)    hx. LCEA, hx Lt renal art. stenting, occl rt renal artery, occluded bil SFAs, moderatie iliac disease,    . Renal cell cancer ([place]) 2001  . Seizure [facility])     Past Surgical History:  Procedure Laterality Date  . CAROTID ENDARTERECTOMY     LEFT  . INCISION AND DRAINAGE ABSCESS Right 10/26/2017   Procedure: INCISION AND DRAINAGE HEMATOMA RIGHT SHIN;  Surgeon: Georganna Skeans, MD;  Location: [place];  Service:  General;  Laterality: Right;  . NEPHRECTOMY     RIGHT  . NM MYOCAR PERF WALL MOTION  07/12/2011   normal  . PV angiogram  2004   Lt renal artery stent  . TONSILLECTOMY    . URETERAL STENT PLACEMENT     LEFT     reports that she quit smoking about 12 years ago. She has never used smokeless tobacco. She reports that she does not drink alcohol or use drugs.  Allergies  Allergen Reactions   . Amlodipine Itching  . Codeine Nausea And Vomiting  . Coreg [Carvedilol] Nausea And Vomiting  . Fentanyl   . Lacosamide      Other name is, VIMPAT  . Levetiracetam Other (See Comments)    Strange feelings in head  . Metformin And Related   . Metoprolol   . Morphine And Related Itching  . Oxybutynin Itching  . Sertraline Nausea Only and Other (See Comments)    Swelling in mouth  . Tessalon [Benzonatate]   . Other Rash    BETA BLOCKER    Family History  Problem Relation Age of Onset  . Cancer Mother   . Heart disease Father     Prior to Admission medications   Medication Sig Start Date End Date Taking? Authorizing Provider  aspirin 81 MG tablet Take 81 mg by mouth daily.    [provider]  clopidogrel (PLAVIX) 75 MG tablet Take 1 tablet (75 mg total) by mouth daily. 10/23/17   Forrest Moron, MD  ferrous sulfate (SLOW FE) 160 (50 Fe) MG TBCR SR tablet Take 1 tablet (160 mg total) by mouth daily. 10/23/17   Forrest Moron, MD  glipiZIDE (GLUCOTROL) 5 MG tablet TAKE 1/2 TABLET BY MOUTH DAILY BEFORE BREAKFAST 06/21/18   [name], MD  lisinopril (PRINIVIL,ZESTRIL) 40 MG tablet TAKE 1 TABLET BY MOUTH DAILY 04/24/18   [name], MD  traMADol (ULTRAM) 50 MG tablet Take 1-2 tablets (50-100 mg total) by mouth every 6 (six) hours as needed for moderate pain or severe pain. 10/26/17   Georganna Skeans, MD  zolpidem (AMBIEN CR) 12.5 MG CR tablet Take 1 tablet (12.5 mg total) by mouth at bedtime as needed for sleep. 03/30/18   Norma Fredrickson, MD    Physical Exam:  Constitutional: Elderly female who appears to be in NAD, calm, comfortable Vitals:   07/11/18 0135  BP: (!) 144/52  Pulse: 68  Resp: 14  Temp: 98.3 F (36.8 C)  TempSrc: Axillary  SpO2: 100%  Weight: 56 kg (123 lb 7.3 oz)  Height: 5\' 1"  (1.549 m)   Eyes: PERRL, lids and conjunctivae normal ENMT: Mucous membranes are dry posterior pharynx clear of any exudate or lesions.  Neck: normal,  supple, no masses, no thyromegaly Respiratory: clear to auscultation bilaterally, no wheezing, no crackles. Normal respiratory effort. No accessory muscle use.  Cardiovascular: Regular rate and rhythm, no murmurs / rubs / gallops. No extremity edema. 2+ pedal pulses. No carotid bruits.  Abdomen: no tenderness, no masses palpated. No hepatosplenomegaly. Bowel sounds positive.   Musculoskeletal: no clubbing / cyanosis. No joint deformity upper and lower extremities. Good ROM, no contractures. Normal muscle tone.  Skin: no rashes, lesions, ulcers. No induration Neurologic: CN 2-12 grossly intact. Sensation intact, DTR normal. Strength 5/5 in all 4.  Psychiatric: Normal judgment and insight. Alert and oriented x 3.  Anxious mood    Labs on Admission: I have personally reviewed following labs and imaging studies  CBC:  No results for input(s): WBC, NEUTROABS, HGB, HCT, MCV, PLT in the last 168 hours. Basic Metabolic Panel: No results for input(s): NA, K, CL, CO2, GLUCOSE, BUN, CREATININE, CALCIUM, MG, PHOS in the last 168 hours. GFR: CrCl cannot be calculated (Patient's most recent lab result is older than the maximum 21 days allowed.). Liver Function Tests: No results for input(s): AST, ALT, ALKPHOS, BILITOT, PROT, ALBUMIN in the last 168 hours. No results for input(s): LIPASE, AMYLASE in the last 168 hours. No results for input(s): AMMONIA in the last 168 hours. Coagulation Profile: No results for input(s): INR, PROTIME in the last 168 hours. Cardiac Enzymes: No results for input(s): CKTOTAL, CKMB, CKMBINDEX, TROPONINI in the last 168 hours. BNP (last 3 results) No results for input(s): PROBNP in the last 8760 hours. HbA1C: No results for input(s): HGBA1C in the last 72 hours. CBG: Recent Labs  Lab 07/11/18 0151  GLUCAP 107*   Lipid Profile: No results for input(s): CHOL, HDL, LDLCALC, TRIG, CHOLHDL, LDLDIRECT in the last 72 hours. Thyroid Function Tests: No results for input(s):  TSH, T4TOTAL, FREET4, T3FREE, THYROIDAB in the last 72 hours. Anemia Panel: No results for input(s): VITAMINB12, FOLATE, FERRITIN, TIBC, IRON, RETICCTPCT in the last 72 hours. Urine analysis:    Component Value Date/Time   COLORURINE YELLOW 10/13/2017 1445   APPEARANCEUR HAZY (A) 10/13/2017 1445   LABSPEC 1.019 10/13/2017 1445   PHURINE 5.0 10/13/2017 1445   GLUCOSEU 50 (A) 10/13/2017 1445   HGBUR NEGATIVE 10/13/2017 1445   BILIRUBINUR negative 10/23/2017 1110   BILIRUBINUR neg 04/10/2015 1406   KETONESUR negative 10/23/2017 1110   KETONESUR NEGATIVE 10/13/2017 1445   PROTEINUR =100 (A) 10/23/2017 1110   PROTEINUR 100 (A) 10/13/2017 1445   UROBILINOGEN 0.2 10/23/2017 1110   UROBILINOGEN 0.2 12/23/2014 1736   NITRITE Negative 10/23/2017 1110   NITRITE NEGATIVE 10/13/2017 1445   LEUKOCYTESUR Trace (A) 10/23/2017 1110   Sepsis Labs: No results found for this or any previous visit (from the past 240 hour(s)).   Radiological Exams on Admission: No results found.  EKG: Independently reviewed.  Sinus rhythm with ST depressions   Assessment/Plan NSTEMI: Acute.  Patient presents with complaints of generalized weakness with some chest discomfort found to have elevated troponin of  5.54. with new ST depressions. - Admit to stepdown bed - Repeat EKG - Trend cardiac troponin - Heparin drip per pharmacy - Continue Plavix and aspirin - Check echocardiogram  Acute renal failure on chronic kidney disease: Patient presents with acute bump in creatinine to 2.6 with BUN 67.  Baseline creatinine previously noted to be around 1.7.  Suspect prerenal cause related to nausea vomiting symptoms. -  IV fluids as tolerated overnight at 75 mL/h - Strict intake and output - Recheck BMP - Check urinalysis  - Hold nephrotoxic agents such as lisinopril  Nausea, Vomiting, Diarrhea: Now resolved.  Patient may have had some type of gastroenteritis as possible cause of symptoms. - Monitoring intake and  output   Hypoglycemia in  diabetes mellitus type 2: On admission to the outside facility patient's initial glucose was noted to be 65. Last hemoglobin A1c noted to be 5.3 on 04/27/2018. - Hypoglycemic protocol - Held glipizide - D5-0.45% NS at 75 ml/hr  Anemia of chronic kidney disease: Hemoglobin noted to be 10.3 at outside facility.  Patient reports no history of bleeding. - Recheck CBC  Essential hypertension - Hold lisinopril due to acute kidney injury  DVT prophylaxis: heparin Code Status: Patient would like to be  a full code at this time.  Previously noted to be DNR. Family Communication: No family present in bed Disposition Plan: To be determined Consults called: Cardiology Admission status: Inpatient  Norval Morton MD [facility] Pager [phone]   If 7PM-7AM, please contact night-coverage [email or web address] Password [facility]  07/11/2018, 2:27 AM

## 2018-07-11 NOTE — Consult Note (Addendum)
Cardiology Consultation:   Patient ID: Gabrielle [name]; [id number]; 11-Nov-1935   Admit date: 07/11/2018 Date of Consult: 07/11/2018  Primary Care Provider: Forrest Moron, MD Primary Cardiologist: Dr. Gwenlyn Found (not seen since 2015)  Patient Profile:   Gabrielle [name] is a 83 y.o. female with a hx of PVOD s/p left carotid endaterectomy, renal artery stenting for RAS, bilateral SFA disease, as well as HTN, HLD, DM, h/o CVA, CKD, RCC s/p nephrectomy and chronic diastolic HF who is being seen today for the evaluation of chest pain/ NSTEMI, at the request of Dr. Cathlean Sauer, Internal Medicine.  History of Present Illness:   Gabrielle [name] has a history of PVOD with known carotid artery disease status post left carotid endarterectomy back in 2004. She has renovascular disease as well as lower occlusive disease status post left renal artery stenting by Dr. Gwenlyn Found back in 2004 with a known occluded right renal artery. She has bilaterally occluded SFAs, as well as moderate iliac disease left greater than right. However she has no documented h/o CAD. In July 2012, she had a NST that was negative for ischemia. Additional PMH includes HTN, HLD, DM, CKD,  prior h/o CVA, RCC s/p nephrectomy and chronic diastolic HF.  She has not been seen since 2015. She was previously followed by Dr. Gwenlyn Found but lost to f/u.   She was admitted to [facility] after being transferred from [facility]. She initially presented with complaints of generalized weakness and chest pain. She was noted to have an elevated troponin I at 5.54 and abnormal proBNP at 2620. SCr 2.7 (baseline is 1.6-1.8). CT of head negative. EKG shows new ST segment depressions in II, V4-V6.  Previous labs reviewed. LDL in 09/2017 was elevated at 118 mg/dL. She was not on a statin prior to admit. Lipitor 40 mg has been initiated.   Pt states that she was feeling ok until yesterday when she started to feel dizzy with substernal chest tightness. Her daughter thought that she  was dehydrated as their home [place] unit recently went out and they felt overheated. Pt notes that her pain worsen with exertion yesterday, but she is now CP free. Troponins are trending downward, 4.49>>4.36>>3.99.   She is currently on IV heparin, ASA, Plavix, Coreg and Lipitor. 2D echo pending.   Past Medical History:  Diagnosis Date  . Anxiety   . Arthritis   . Azotemia   . Carotid artery disease ([place])   . Claudication ([place])   . CVA (cerebral vascular accident) ([place])    [place] DIZZINESS 2008  . Depression   . Diabetes mellitus    1990  . Essential hypertension, benign   . History of anemia   . History of gout   . History of kidney stones   . Hyperlipidemia   . Neck pain   . PAD (peripheral artery disease) (HCC)    hx. LCEA, hx Lt renal art. stenting, occl rt renal artery, occluded bil SFAs, moderatie iliac disease,    . Renal cell cancer ([place]) 2001  . Seizure [facility])     Past Surgical History:  Procedure Laterality Date  . CAROTID ENDARTERECTOMY     LEFT  . INCISION AND DRAINAGE ABSCESS Right 10/26/2017   Procedure: INCISION AND DRAINAGE HEMATOMA RIGHT SHIN;  Surgeon: Georganna Skeans, MD;  Location: [place];  Service: General;  Laterality: Right;  . NEPHRECTOMY     RIGHT  . NM MYOCAR PERF WALL MOTION  07/12/2011   normal  . PV angiogram  2004  Lt renal artery stent  . TONSILLECTOMY    . URETERAL STENT PLACEMENT     LEFT     Home Medications:  Prior to Admission medications   Medication Sig Start Date End Date Taking? Authorizing Provider  acetaminophen (TYLENOL) 500 MG tablet Take 1,000 mg by mouth every 6 (six) hours as needed.   Yes [provider]  amLODipine (NORVASC) 5 MG tablet Take 5 mg by mouth daily. 05/28/18  Yes [provider]  aspirin 81 MG tablet Take 81 mg by mouth daily.   Yes [provider]  clopidogrel (PLAVIX) 75 MG tablet Take 1 tablet (75 mg total) by mouth daily. 10/23/17  Yes Stallings, Zoe A, MD  ferrous sulfate (SLOW FE)  160 (50 Fe) MG TBCR SR tablet Take 1 tablet (160 mg total) by mouth daily. 10/23/17  Yes Delia Chimes A, MD  glipiZIDE (GLUCOTROL) 5 MG tablet TAKE 1/2 TABLET BY MOUTH DAILY BEFORE BREAKFAST 06/21/18  Yes [name], MD  lisinopril (PRINIVIL,ZESTRIL) 40 MG tablet TAKE 1 TABLET BY MOUTH DAILY 04/24/18  Yes [name], MD  zolpidem (AMBIEN CR) 12.5 MG CR tablet Take 1 tablet (12.5 mg total) by mouth at bedtime as needed for sleep. 03/30/18  Yes Plovsky, Berneta Sages, MD    Inpatient Medications: Scheduled Meds: . aspirin EC  81 mg Oral Daily  . atorvastatin  40 mg Oral q1800  . carvedilol  3.125 mg Oral BID WC  . clopidogrel  75 mg Oral Daily  . insulin aspart  0-9 Units Subcutaneous TID WC   Continuous Infusions: . dextrose 5 % and 0.45% NaCl 75 mL/hr at 07/11/18 1200  . heparin 650 Units/hr (07/11/18 1200)   PRN Meds: acetaminophen, nitroGLYCERIN, ondansetron (ZOFRAN) IV  Allergies:    Allergies  Allergen Reactions  . Amlodipine Itching  . Codeine Nausea And Vomiting  . Coreg [Carvedilol] Nausea And Vomiting  . Fentanyl Other (See Comments)    unknown  . Lacosamide Other (See Comments)     Other name is, VIMPAT unknown  . Levetiracetam Other (See Comments)    Strange feelings in head  . Metformin And Related Other (See Comments)    unknown  . Metoprolol Other (See Comments)    unknown  . Morphine And Related Itching  . Oxybutynin Swelling    mouth  . Sertraline Nausea Only and Other (See Comments)    Swelling in mouth  . Tessalon [Benzonatate] Other (See Comments)    unknown  . Other Rash    BETA BLOCKER    Social History:   Social History   Socioeconomic History  . Marital status: Married    Spouse name: Not on file  . Number of children: 2  . Years of education: Not on file  . Highest education level: Not on file  Occupational History  . Occupation: Retired    Fish farm manager: RETIRED  Social Needs  . Financial resource strain: Not on file  . Food  insecurity:    Worry: Not on file    Inability: Not on file  . Transportation needs:    Medical: Not on file    Non-medical: Not on file  Tobacco Use  . Smoking status: Former Smoker    Last attempt to quit: 12/26/2005    Years since quitting: 12.5  . Smokeless tobacco: Never Used  Substance and Sexual Activity  . Alcohol use: No  . Drug use: No  . Sexual activity: Never    Birth control/protection: Abstinence  Lifestyle  . Physical activity:    Days per week: Not on file    Minutes per session: Not on file  . Stress: Not on file  Relationships  . Social connections:    Talks on phone: Not on file    Gets together: Not on file    Attends religious service: Not on file    Active member of club or organization: Not on file    Attends meetings of clubs or organizations: Not on file    Relationship status: Not on file  . Intimate partner violence:    Fear of current or ex partner: Not on file    Emotionally abused: Not on file    Physically abused: Not on file    Forced sexual activity: Not on file  Other Topics Concern  . Not on file  Social History Narrative  . Not on file    Family History:    Family History  Problem Relation Age of Onset  . Cancer Mother   . Heart disease Father      ROS:  Please see the history of present illness.   All other ROS reviewed and negative.     Physical Exam/Data:   Vitals:   07/11/18 0743 07/11/18 0800 07/11/18 1200 07/11/18 1203  BP: (!) 131/46 (!) 131/46 (!) 109/43 (!) 109/43  Pulse: 69 73 69 61  Resp:   20   Temp:  98.5 F (36.9 C) 99.8 F (37.7 C) 99.8 F (37.7 C)  TempSrc: Oral Oral Oral Oral  SpO2: 98% 97% 100% 100%  Weight:      Height:        Intake/Output Summary (Last 24 hours) at 07/11/2018 1403 Last data filed at 07/11/2018 1300 Gross per 24 hour  Intake 846.79 ml  Output 600 ml  Net 246.79 ml   Filed Weights   07/11/18 0135  Weight: 123 lb 7.3 oz (56 kg)   Body mass index is 23.33 kg/m.  General:   Elderly WF. Well nourished, well developed, in no acute distress HEENT: normal Lymph: no adenopathy Neck: no JVD, + left carotid bruit Endocrine:  No thryomegaly Vascular: No carotid bruits; FA pulses 2+ bilaterally without bruits  Cardiac:  normal S1, S2; RRR; no murmur  Lungs:  clear to auscultation bilaterally, no wheezing, rhonchi or rales  Abd: soft, nontender, no hepatomegaly  Ext: no edema Musculoskeletal:  No deformities, BUE and BLE strength normal and equal Skin: warm and dry  Neuro:  CNs 2-12 intact, no focal abnormalities noted Psych:  Normal affect   EKG:  The EKG was personally reviewed and demonstrates:  NSR with new ST segment depressions in II, V4-V6 Telemetry:  Telemetry was personally reviewed and demonstrates:  NSR  Relevant CV Studies: 2D Echo pending   Laboratory Data:  Chemistry Recent Labs  Lab 07/11/18 0250  NA 134*  K 3.6  CL 101  CO2 20*  GLUCOSE 106*  BUN 58*  CREATININE 2.77*  CALCIUM 8.0*  GFRNONAA 15*  GFRAA 18*  ANIONGAP 13    Recent Labs  Lab 07/11/18 0250  PROT 5.7*  ALBUMIN 3.0*  AST 21  ALT 14  ALKPHOS 49  BILITOT 0.5   Hematology Recent Labs  Lab 07/11/18 0250  WBC 7.8  RBC 3.39*  HGB 9.1*  HCT 28.7*  MCV 84.7  MCH 26.8  MCHC 31.7  RDW 15.9*  PLT 365   Cardiac Enzymes Recent Labs  Lab 07/11/18 0250 07/11/18 0543 07/11/18 0842  TROPONINI  4.49* 4.36* 3.99*   No results for input(s): TROPIPOC in the last 168 hours.  BNPNo results for input(s): BNP, PROBNP in the last 168 hours.  DDimer No results for input(s): DDIMER in the last 168 hours.  Radiology/Studies:  No results found.  Assessment and Plan:   Gabrielle [name] is a 83 y.o. female with a hx of PVOD s/p left carotid endaterectomy, renal artery stenting for RAS, bilateral SFA disease, as well as HTN, HLD, DM, h/o CVA, CKD, RCC s/p nephrectomy and chronic diastolic HF who is being seen today for the evaluation of chest pain/ NSTEMI, at the request of  Dr. Cathlean Sauer, Internal Medicine.  1. NSTEMI: pt with new SSCP c/w unstable angina and troponin elevation c/w ACS. Troponin 4.49>>4.36>>3.99. She is currently CP free on IV heparin. 2D echo pending. Given her acute on chronic CKD, we will continue with medical management for now, but will try to pursue cardiac cath if renal function improves. Given her history, suspect she may have multivessel disease. Continue ASA, Plavix, high intensity statin, along with BB therapy. We will follow along with you.   2. Carotid Artery Disease: s/p left carotid endarterectomy in 2004. Physical exam notable for left sided bruit. Last carotid doppler study 09/2017 showed 40-59% right ICA disease and only 1-39% left ICA. She had some slight dizziness yesterday but now asymptomatic. No syncope/ near syncope. Continue ASA, Plavix and Statin + BP control. Recommend continued yearly assessment by carotid dopplers.  3. Acute on chronic CKD: s/p nephrectomy for RCC. SCr up to 2.6, above previous baseline of 1.6-1.8. ? Prerenal AKI given recent diarrhea, hot environment [facility] out). Renal US pending. Continue hydration. Avoid nephrotoxins. Monitor closely. Recommend nephrology consultation given worsening renal function and to provide recommendations, in the event that we may need to pursue cardiac cath.   4.DM: management per primary team.   5. HTN: BP controlled on current regimen.   6. HLD: LDL in 09/2017 was elevated at 118. Goal is < 70 mg/dl. She has been started on high potency statin, Lipitor 40 mg. Repeat FLP and HFTs in 6-8 weeks.    For questions or updates, please contact [place] Please consult [email or web address] for contact info under Cardiology/STEMI.   Signed, Lyda Jester, PA-C  07/11/2018 2:03 PM

## 2018-07-12 ENCOUNTER — Other Ambulatory Visit: Payer: Self-pay

## 2018-07-12 ENCOUNTER — Encounter (HOSPITAL_COMMUNITY): Payer: Self-pay | Admitting: General Practice

## 2018-07-12 DIAGNOSIS — N184 Chronic kidney disease, stage 4 (severe): Secondary | ICD-10-CM

## 2018-07-12 LAB — CBC WITH DIFFERENTIAL/PLATELET
Abs Immature Granulocytes: 0 10*3/uL (ref 0.0–0.1)
Basophils Absolute: 0 10*3/uL (ref 0.0–0.1)
Basophils Relative: 1 %
Eosinophils Absolute: 0.1 10*3/uL (ref 0.0–0.7)
Eosinophils Relative: 3 %
HCT: 28.7 % — ABNORMAL LOW (ref 36.0–46.0)
Hemoglobin: 8.9 g/dL — ABNORMAL LOW (ref 12.0–15.0)
Immature Granulocytes: 1 %
Lymphocytes Relative: 17 %
Lymphs Abs: 0.9 10*3/uL (ref 0.7–4.0)
MCH: 26.3 pg (ref 26.0–34.0)
MCHC: 31 g/dL (ref 30.0–36.0)
MCV: 84.7 fL (ref 78.0–100.0)
Monocytes Absolute: 0.8 10*3/uL (ref 0.1–1.0)
Monocytes Relative: 14 %
Neutro Abs: 3.5 10*3/uL (ref 1.7–7.7)
Neutrophils Relative %: 64 %
Platelets: 329 10*3/uL (ref 150–400)
RBC: 3.39 MIL/uL — ABNORMAL LOW (ref 3.87–5.11)
RDW: 15.9 % — ABNORMAL HIGH (ref 11.5–15.5)
WBC: 5.4 10*3/uL (ref 4.0–10.5)

## 2018-07-12 LAB — BASIC METABOLIC PANEL
Anion gap: 8 (ref 5–15)
BUN: 42 mg/dL — ABNORMAL HIGH (ref 8–23)
CO2: 21 mmol/L — ABNORMAL LOW (ref 22–32)
Calcium: 7.8 mg/dL — ABNORMAL LOW (ref 8.9–10.3)
Chloride: 107 mmol/L (ref 98–111)
Creatinine, Ser: 2.26 mg/dL — ABNORMAL HIGH (ref 0.44–1.00)
GFR calc Af Amer: 22 mL/min — ABNORMAL LOW (ref >60)
GFR calc non Af Amer: 19 mL/min — ABNORMAL LOW (ref >60)
Glucose, Bld: 125 mg/dL — ABNORMAL HIGH (ref 70–99)
Potassium: 4.1 mmol/L (ref 3.5–5.1)
Sodium: 136 mmol/L (ref 135–145)

## 2018-07-12 LAB — IRON AND TIBC
Iron: 22 ug/dL — ABNORMAL LOW (ref 28–170)
Saturation Ratios: 9 % — ABNORMAL LOW (ref 10.4–31.8)
TIBC: 252 ug/dL (ref 250–450)
UIBC: 230 ug/dL

## 2018-07-12 LAB — GLUCOSE, CAPILLARY
Glucose-Capillary: 106 mg/dL — ABNORMAL HIGH (ref 70–99)
Glucose-Capillary: 105 mg/dL — ABNORMAL HIGH (ref 70–99)
Glucose-Capillary: 126 mg/dL — ABNORMAL HIGH (ref 70–99)
Glucose-Capillary: 90 mg/dL (ref 70–99)

## 2018-07-12 LAB — TRANSFERRIN: Transferrin: 180 mg/dL — ABNORMAL LOW (ref 192–382)

## 2018-07-12 LAB — HEPARIN LEVEL (UNFRACTIONATED)
Heparin Unfractionated: 0.29 IU/mL — ABNORMAL LOW (ref 0.30–0.70)
Heparin Unfractionated: 0.46 IU/mL (ref 0.30–0.70)

## 2018-07-12 LAB — FERRITIN: Ferritin: 1 ng/mL — ABNORMAL LOW (ref 11–307)

## 2018-07-12 MED ORDER — SODIUM CHLORIDE 0.9 % IV SOLN
INTRAVENOUS | Status: DC
  Administered 2018-07-13: 100 mL/h via INTRAVENOUS

## 2018-07-12 MED ORDER — SODIUM CHLORIDE 0.9% FLUSH
3.0000 mL | Freq: Two times a day (BID) | INTRAVENOUS | Status: DC
  Administered 2018-07-13 (×2): 3 mL via INTRAVENOUS

## 2018-07-12 MED ORDER — ZOLPIDEM TARTRATE 5 MG PO TABS
5.0000 mg | ORAL_TABLET | Freq: Every evening | ORAL | Status: DC | PRN
  Administered 2018-07-12 – 2018-07-18 (×8): 5 mg via ORAL
  Filled 2018-07-12 (×8): qty 1

## 2018-07-12 MED ORDER — SODIUM CHLORIDE 0.9 % IV SOLN
INTRAVENOUS | Status: DC
  Administered 2018-07-12 (×2): via INTRAVENOUS

## 2018-07-12 MED ORDER — SODIUM CHLORIDE 0.9% FLUSH: 3.0000 mL | INTRAVENOUS | Status: DC | PRN

## 2018-07-12 MED ORDER — SODIUM CHLORIDE 0.9 % IV SOLN: 250.0000 mL | INTRAVENOUS | Status: DC | PRN

## 2018-07-12 NOTE — Progress Notes (Addendum)
ANTICOAGULATION CONSULT NOTE - Follow Up Consult  Pharmacy Consult for Heparin Indication: chest pain/ACS  Allergies  Allergen Reactions  . Amlodipine Itching  . Codeine Nausea And Vomiting  . Coreg [Carvedilol] Nausea And Vomiting  . Fentanyl Other (See Comments)    unknown  . Lacosamide Other (See Comments)     Other name is, VIMPAT unknown  . Levetiracetam Other (See Comments)    Strange feelings in head  . Metformin And Related Other (See Comments)    unknown  . Metoprolol Other (See Comments)    unknown  . Morphine And Related Itching  . Oxybutynin Swelling    mouth  . Sertraline Nausea Only and Other (See Comments)    Swelling in mouth  . Tessalon [Benzonatate] Other (See Comments)    unknown  . Other Rash    BETA BLOCKER    Patient Measurements: Height: 5\' 1"  (154.9 cm) Weight: 124 lb 4.8 oz (56.4 kg) IBW/kg (Calculated) : 47.8 Heparin Dosing Weight: 56 kg  Vital Signs: Temp: 98.7 F (37.1 C) (07/18 0755) Temp Source: Oral (07/18 0755) BP: 138/58 (07/18 0755) Pulse Rate: 62 (07/18 0755)  Labs: Recent Labs    07/11/18 0250 07/11/18 0543 07/11/18 0842 07/11/18 1139 07/12/18 0349 07/12/18 1017  HGB 9.1*  --   --   --  8.9*  --   HCT 28.7*  --   --   --  28.7*  --   PLT 365  --   --   --  329  --   HEPARINUNFRC  --  0.60  --  0.51  --  0.29*  CREATININE 2.77*  --   --   --  2.26*  --   TROPONINI 4.49* 4.36* 3.99*  --   --   --     Estimated Creatinine Clearance: 15 mL/min (A) (by C-G formula based on SCr of 2.26 mg/dL (H)).   Medications:  Infusions:  . sodium chloride 100 mL/hr at 07/12/18 0350  . heparin 650 Units/hr (07/12/18 0711)    Assessment: 83 year old female who transferred from [place] with elevated troponin on IV Heparin infusion. Pharmacy was consulted to continue Heparin dosing for ACS.  Hgb 10.3 >>9.1>>8.9. Platelets are within normal limits.  Heparin level is subtherapeutic, no bleeding or issues with infusion  reported.  Goal of Therapy:  Heparin level 0.3-0.7 units/ml Monitor platelets by anticoagulation protocol: Yes   Plan:  Increase Heparin at current rate of 750 units/hr.  Recheck heparin level in 8 hours due to age and CrCl  Daily Heparin level and CBC while on therapy.  Isaias Sakai, Sherian Rein D PGY1 Pharmacy Resident  Phone [phone] 07/12/2018      11:56 AM

## 2018-07-12 NOTE — Progress Notes (Signed)
ANTICOAGULATION CONSULT NOTE - Follow Up Consult  Pharmacy Consult for Heparin Indication: chest pain/ACS  Allergies  Allergen Reactions  . Amlodipine Itching  . Codeine Nausea And Vomiting  . Coreg [Carvedilol] Nausea And Vomiting  . Fentanyl Other (See Comments)    unknown  . Lacosamide Other (See Comments)     Other name is, VIMPAT unknown  . Levetiracetam Other (See Comments)    Strange feelings in head  . Metformin And Related Other (See Comments)    unknown  . Metoprolol Other (See Comments)    unknown  . Morphine And Related Itching  . Oxybutynin Swelling    mouth  . Sertraline Nausea Only and Other (See Comments)    Swelling in mouth  . Tessalon [Benzonatate] Other (See Comments)    unknown  . Other Rash    BETA BLOCKER    Patient Measurements: Height: 5\' 1"  (154.9 cm) Weight: 124 lb 4.8 oz (56.4 kg) IBW/kg (Calculated) : 47.8 Heparin Dosing Weight: 56 kg  Vital Signs: Temp: 98.4 F (36.9 C) (07/18 2036) Temp Source: Oral (07/18 2036) BP: 128/43 (07/18 2036) Pulse Rate: 66 (07/18 2036)  Labs: Recent Labs    07/11/18 0250  07/11/18 0543 07/11/18 0842 07/11/18 1139 07/12/18 0349 07/12/18 1017 07/12/18 2021  HGB 9.1*  --   --   --   --  8.9*  --   --   HCT 28.7*  --   --   --   --  28.7*  --   --   PLT 365  --   --   --   --  329  --   --   HEPARINUNFRC  --    < > 0.60  --  0.51  --  0.29* 0.46  CREATININE 2.77*  --   --   --   --  2.26*  --   --   TROPONINI 4.49*  --  4.36* 3.99*  --   --   --   --    < > = values in this interval not displayed.    Estimated Creatinine Clearance: 15 mL/min (A) (by C-G formula based on SCr of 2.26 mg/dL (H)).   Medications:  Infusions:  . sodium chloride 100 mL/hr at 07/12/18 1934  . heparin 750 Units/hr (07/12/18 1230)    Assessment: 83 year old female who transferred from [place] with elevated troponin on IV Heparin infusion. Pharmacy was consulted to continue Heparin dosing for ACS.  Hgb 10.3  >>9.1>>8.9. Platelets are within normal limits.   PM f/u > Heparin level is at goal, no bleeding or issues with infusion reported.  Goal of Therapy:  Heparin level 0.3-0.7 units/ml Monitor platelets by anticoagulation protocol: Yes   Plan:  Continue IV heparin at current rate. Daily Heparin level and CBC.Marguerite Olea, [address] Clinical Pharmacist Phone [phone]  07/12/2018 9:20 PM

## 2018-07-12 NOTE — H&P (View-Only) (Signed)
Progress Note  Patient Name: Gabrielle [name] Date of Encounter: 07/12/2018  Primary Cardiologist: Quay Burow, MD   Subjective   Patient reports feeling fatigued this morning. Has occasional SOB. She denies recurrence of chest pain/tightness.   Inpatient Medications    Scheduled Meds: . aspirin EC  81 mg Oral Daily  . atorvastatin  40 mg Oral q1800  . carvedilol  3.125 mg Oral BID WC  . clopidogrel  75 mg Oral Daily  . insulin aspart  0-9 Units Subcutaneous TID WC   Continuous Infusions: . sodium chloride 100 mL/hr at 07/12/18 0922  . heparin 650 Units/hr (07/12/18 0711)   PRN Meds: acetaminophen, nitroGLYCERIN, ondansetron (ZOFRAN) IV   Vital Signs    Vitals:   07/11/18 2011 07/11/18 2337 07/12/18 0401 07/12/18 0755  BP: (!) 114/48 (!) 134/51 (!) 128/47 (!) 138/58  Pulse: 82 70 69 62  Resp: 20 16 15 16   Temp: 99.5 F (37.5 C) 98.6 F (37 C) 98.8 F (37.1 C) 98.7 F (37.1 C)  TempSrc: Oral Oral Oral Oral  SpO2: 99% 100% 99% 98%  Weight:   124 lb 4.8 oz (56.4 kg)   Height:        Intake/Output Summary (Last 24 hours) at 07/12/2018 0954 Last data filed at 07/12/2018 9622 Gross per 24 hour  Intake 2964.49 ml  Output 750 ml  Net 2214.49 ml   Filed Weights   07/11/18 0135 07/12/18 0401  Weight: 123 lb 7.3 oz (56 kg) 124 lb 4.8 oz (56.4 kg)    Telemetry    NSR/sinus bradycardia - Personally Reviewed  Physical Exam   GEN: Sleeping but arouses easily. Laying in bed in no acute distress.  Neck: No JVD, no carotid bruits Cardiac: RRR, no murmurs, rubs, or gallops.  Respiratory: Clear to auscultation bilaterally, no wheezes/ rales/ rhonchi GI: NABS, Soft, nontender, non-distended  MS: No edema; No deformity. Neuro:  Nonfocal, moving all extremities spontaneously Psych: Normal affect   Labs    Chemistry Recent Labs  Lab 07/11/18 0250 07/12/18 0349  NA 134* 136  K 3.6 4.1  CL 101 107  CO2 20* 21*  GLUCOSE 106* 125*  BUN 58* 42*  CREATININE  2.77* 2.26*  CALCIUM 8.0* 7.8*  PROT 5.7*  --   ALBUMIN 3.0*  --   AST 21  --   ALT 14  --   ALKPHOS 49  --   BILITOT 0.5  --   GFRNONAA 15* 19*  GFRAA 18* 22*  ANIONGAP 13 8     Hematology Recent Labs  Lab 07/11/18 0250 07/12/18 0349  WBC 7.8 5.4  RBC 3.39* 3.39*  HGB 9.1* 8.9*  HCT 28.7* 28.7*  MCV 84.7 84.7  MCH 26.8 26.3  MCHC 31.7 31.0  RDW 15.9* 15.9*  PLT 365 329    Cardiac Enzymes Recent Labs  Lab 07/11/18 0250 07/11/18 0543 07/11/18 0842  TROPONINI 4.49* 4.36* 3.99*   No results for input(s): TROPIPOC in the last 168 hours.   BNPNo results for input(s): BNP, PROBNP in the last 168 hours.   DDimer No results for input(s): DDIMER in the last 168 hours.   Radiology    US Renal  Result Date: 07/11/2018 CLINICAL DATA:  Initial evaluation for acute renal injury. Status post right nephrectomy. EXAM: RENAL / URINARY TRACT ULTRASOUND COMPLETE COMPARISON:  None. FINDINGS: Right Kidney: Surgically absent. Left Kidney: Length: 10.4 cm. Echogenicity within normal limits. No hydronephrosis. 1.6 x 1.5 x 1.4 cm cyst present at  the upper pole. Additional 1.1 x 0.8 x 1.0 cyst at the upper pole. Bladder: Appears normal for degree of bladder distention. Left jet visualized. IMPRESSION: 1. Left kidney demonstrates no hydronephrosis or other acute abnormality. 2. Left renal cysts as above. 3. Prior right nephrectomy. Electronically Signed   By: Jeannine Boga M.D.   On: 07/11/2018 23:42    Cardiac Studies   Echocardiogram 07/11/18: Study Conclusions  - Left ventricle: The cavity size was normal. There was moderate   concentric hypertrophy. Systolic function was normal. The   estimated ejection fraction was in the range of 55% to 60%. Wall   motion was normal; there were no regional wall motion   abnormalities. Doppler parameters are consistent with abnormal   left ventricular relaxation (grade 1 diastolic dysfunction).   Doppler parameters are consistent with high  ventricular filling   pressure. - Aortic valve: Transvalvular velocity was within the normal range.   There was no stenosis. There was trivial regurgitation. - Mitral valve: Calcified annulus. Transvalvular velocity was   within the normal range. There was no evidence for stenosis.   There was mild regurgitation. - Right ventricle: The cavity size was normal. Wall thickness was   normal. Systolic function was normal. - Atrial septum: No defect or patent foramen ovale was identified   by color flow Doppler. - Tricuspid valve: There was trivial regurgitation. - Pulmonary arteries: Systolic pressure was within the normal   range. PA peak pressure: 29 mm Hg (S).  Patient Profile     83 y.o. female with PMH of PVOD s/p left carotid endaterectomy, renal artery stenting for RAS, bilateral SFA disease, as well as HTN, HLD, DM, h/o CVA, CKD, RCC s/p nephrectomy and chronic diastolic HF, who is being followed by cardiology for NSTEMI.  Assessment & Plan    1. NSTEMI: p/w SSCP c/f unstable angina. Trop trend 4.49>4.36>3.99. EKG with new inferolateral TWI. She was started on a heparin gtt for conservative management given acute on chronic renal insufficiency, with Cr 2.77 on admission, limiting ability to perform a LHC. Echo 7/17 with EF 55-60%, no wall motion abnormalities, and G1DD. Cr improved to 2.26 this morning (baseline 1.8). She continues to remain CP free. - Continue conservative management with heparin gtt, ASA, statin, BBlocker, and plavix - Continue prn nitro - Hopeful kidney function will return to baseline, at which time would consider LHC to further evaluate coronary anatomy   2. Acute on chronic CKD: s/p nephrectomy for RCC. Cr peaked at 2.77 (baseline appears to be 1.8). Thought to be pre-renal given recent diarrhea and hot environment in the setting of a broken AC unit. Renal US without hydronephrosis. She was recommended for a Nephrology consult yesterday. She has been on IVF and Cr  has improved to 2.26 today.  - Continue IVF resuscitation  - Monitor volume status closely with history of chronic diastolic CHF  3. HTN: BP stable. Home lisinopril on hold for acute on chronic renal insufficiency. - Continue current regimen  4. DM type 2: - Continue management per primary team  5. HLD: LDL 118 09/2017; goal of <70.  - Continue statin  For questions or updates, please contact [place] Please consult [email or web address] for contact info under Cardiology/STEMI.      Signed, Abigail Butts, PA-C  07/12/2018, 9:54 AM   [phone]

## 2018-07-12 NOTE — Consult Note (Addendum)
[facility]  Renal Consult Note   HPI: I was asked by Dr. Tamala Julian to see Gabrielle [name] who is a 83 y.o. female w/ a PMHx notable for G1DD w/ an LVEF of 55%, RCC s/p nephrectomy, CKD III, and prior CVA with mild baseline cognitive decline who presented to the ED for generalized weakness. She endorsed general unwellness for several days prior to admission.  Patient had troponin was elevated to greater than 4.0 with minimal inferolateral ST depression. Cardiology was consulted for an NSTEMI and recommended cardiac catheterization with staged PCI.   Nephrology was consulted to evaluate the patient's renal function in anticipation of IV dye load.  Patient said that she felt better on admission to the hospital.  She denies chest pain, nausea, vomiting, diarrhea, constipation, myalgias, headaches, visual changes, palpitations, abdominal pain, fever or chills.   She stated that she understands function was most likely worsened by combination of her medications (lisinopril)) in conjunction with dehydration and/or hypovolemia but is improving.  Past Medical History:  Diagnosis Date  . Anxiety   . Arthritis   . Azotemia   . Carotid artery disease ([place])   . Claudication ([place])   . CVA (cerebral vascular accident) ([place])    [place] DIZZINESS 2008  . Depression   . Diabetes mellitus    1990  . Essential hypertension, benign   . History of anemia   . History of gout   . History of kidney stones   . Hyperlipidemia   . Neck pain   . PAD (peripheral artery disease) (HCC)    hx. LCEA, hx Lt renal art. stenting, occl rt renal artery, occluded bil SFAs, moderatie iliac disease,    . Renal cell cancer ([place]) 2001  . Seizure [facility])    Past Surgical History:  Procedure Laterality Date  . CAROTID ENDARTERECTOMY     LEFT  . INCISION AND DRAINAGE ABSCESS Right 10/26/2017   Procedure: INCISION AND DRAINAGE HEMATOMA RIGHT SHIN;  Surgeon: Georganna Skeans, MD;  Location: [place];  Service: General;   Laterality: Right;  . NEPHRECTOMY     RIGHT  . NM MYOCAR PERF WALL MOTION  07/12/2011   normal  . PV angiogram  2004   Lt renal artery stent  . TONSILLECTOMY    . URETERAL STENT PLACEMENT     LEFT   Social History:  reports that she quit smoking about 12 years ago. She has never used smokeless tobacco. She reports that she does not drink alcohol or use drugs. Allergies:  Allergies  Allergen Reactions  . Amlodipine Itching  . Codeine Nausea And Vomiting  . Coreg [Carvedilol] Nausea And Vomiting  . Fentanyl Other (See Comments)    unknown  . Lacosamide Other (See Comments)     Other name is, VIMPAT unknown  . Levetiracetam Other (See Comments)    Strange feelings in head  . Metformin And Related Other (See Comments)    unknown  . Metoprolol Other (See Comments)    unknown  . Morphine And Related Itching  . Oxybutynin Swelling    mouth  . Sertraline Nausea Only and Other (See Comments)    Swelling in mouth  . Tessalon [Benzonatate] Other (See Comments)    unknown  . Other Rash    BETA BLOCKER   Family History  Problem Relation Age of Onset  . Cancer Mother   . Heart disease Father     Medications: I have reviewed the patient's current medications.   ROS: ROS  negative except as per HPI. Blood pressure (!) 138/58, pulse 62, temperature 98.7 F (37.1 C), temperature source Oral, resp. rate 16, height 5' 1" (1.549 m), weight 124 lb 4.8 oz (56.4 kg), SpO2 98 %.  General appearance: alert, cooperative, appears stated age and no distress Resp: clear to auscultation bilaterally Chest wall: no tenderness Cardio: regular rate and rhythm, S1, S2 normal, no murmur, click, rub or gallop GI: soft, non-tender; bowel sounds normal; no masses,  no organomegaly Extremities: extremities normal, atraumatic, no cyanosis or edema Skin: Skin color, texture, turgor normal. No rashes or lesions or normal Neurologic: Alert and oriented X 3, normal strength and tone. Normal symmetric  reflexes. Normal coordination and gait Results for orders placed or performed during the hospital encounter of 07/11/18 (from the past 48 hour(s))  Glucose, capillary     Status: Abnormal   Collection Time: 07/11/18  1:18 AM  Result Value Ref Range   Glucose-Capillary 58 (L) 70 - 99 mg/dL  MRSA PCR Screening     Status: None   Collection Time: 07/11/18  1:39 AM  Result Value Ref Range   MRSA by PCR NEGATIVE NEGATIVE    Comment:        The GeneXpert MRSA Assay (FDA approved for NASAL specimens only), is one component of a comprehensive MRSA colonization surveillance program. It is not intended to diagnose MRSA infection nor to guide or monitor treatment for MRSA infections. Performed at [facility], [place] [address], [place], [place] [address]   Glucose, capillary     Status: Abnormal   Collection Time: 07/11/18  1:51 AM  Result Value Ref Range   Glucose-Capillary 107 (H) 70 - 99 mg/dL  Troponin I-serum (0, 3, 6 hours)     Status: Abnormal   Collection Time: 07/11/18  2:50 AM  Result Value Ref Range   Troponin I 4.49 (HH) <0.03 ng/mL    Comment: CRITICAL RESULT CALLED TO, READ BACK BY AND VERIFIED WITH: M.MUZA,RN 0400 07/11/18 G.[email or web address] Performed at [facility], [place] [address], [place], [place] [address]   CBC with Differential/Platelet     Status: Abnormal   Collection Time: 07/11/18  2:50 AM  Result Value Ref Range   WBC 7.8 4.0 - 10.5 K/uL   RBC 3.39 (L) 3.87 - 5.11 MIL/uL   Hemoglobin 9.1 (L) 12.0 - 15.0 g/dL   HCT 28.7 (L) 36.0 - 46.0 %   MCV 84.7 78.0 - 100.0 fL   MCH 26.8 26.0 - 34.0 pg   MCHC 31.7 30.0 - 36.0 g/dL   RDW 15.9 (H) 11.5 - 15.5 %   Platelets 365 150 - 400 K/uL   Neutrophils Relative % 70 %   Neutro Abs 5.5 1.7 - 7.7 K/uL   Lymphocytes Relative 12 %   Lymphs Abs 1.0 0.7 - 4.0 K/uL   Monocytes Relative 14 %   Monocytes Absolute 1.1 (H) 0.1 - 1.0 K/uL   Eosinophils Relative 2 %   Eosinophils Absolute 0.1 0.0 - 0.7 K/uL   Basophils  Relative 1 %   Basophils Absolute 0.1 0.0 - 0.1 K/uL   Immature Granulocytes 1 %   Abs Immature Granulocytes 0.1 0.0 - 0.1 K/uL    Comment: Performed at [facility], [place] [address], [place], [place] [address]  Comprehensive metabolic panel     Status: Abnormal   Collection Time: 07/11/18  2:50 AM  Result Value Ref Range   Sodium 134 (L) 135 - 145 mmol/L  Potassium 3.6 3.5 - 5.1 mmol/L   Chloride 101 98 - 111 mmol/L    Comment: Please note change in reference range.   CO2 20 (L) 22 - 32 mmol/L   Glucose, Bld 106 (H) 70 - 99 mg/dL    Comment: Please note change in reference range.   BUN 58 (H) 8 - 23 mg/dL    Comment: Please note change in reference range.   Creatinine, Ser 2.77 (H) 0.44 - 1.00 mg/dL   Calcium 8.0 (L) 8.9 - 10.3 mg/dL   Total Protein 5.7 (L) 6.5 - 8.1 g/dL   Albumin 3.0 (L) 3.5 - 5.0 g/dL   AST 21 15 - 41 U/L   ALT 14 0 - 44 U/L    Comment: Please note change in reference range.   Alkaline Phosphatase 49 38 - 126 U/L   Total Bilirubin 0.5 0.3 - 1.2 mg/dL   GFR calc non Af Amer 15 (L) >60 mL/min   GFR calc Af Amer 18 (L) >60 mL/min    Comment: (NOTE) The eGFR has been calculated using the CKD EPI equation. This calculation has not been validated in all clinical situations. eGFR's persistently <60 mL/min signify possible Chronic Kidney Disease.    Anion gap 13 5 - 15    Comment: Performed at [facility] [address] [place], [place] [address]  Troponin I-serum (0, 3, 6 hours)     Status: Abnormal   Collection Time: 07/11/18  5:43 AM  Result Value Ref Range   Troponin I 4.36 (HH) <0.03 ng/mL    Comment: CRITICAL VALUE NOTED.  VALUE IS CONSISTENT WITH PREVIOUSLY REPORTED AND CALLED VALUE. Performed at [facility], [place] [address], [place], [place] [address]   Heparin level (unfractionated)     Status: None   Collection Time: 07/11/18  5:43 AM  Result Value Ref Range   Heparin Unfractionated 0.60 0.30 - 0.70 IU/mL    Comment:  (NOTE) If heparin results are below expected values, and patient dosage has  been confirmed, suggest follow up testing of antithrombin III levels. Performed at [facility], [place] [address], [place], [place] [address]   Glucose, capillary     Status: Abnormal   Collection Time: 07/11/18  7:41 AM  Result Value Ref Range   Glucose-Capillary 111 (H) 70 - 99 mg/dL  Troponin I-serum (0, 3, 6 hours)     Status: Abnormal   Collection Time: 07/11/18  8:42 AM  Result Value Ref Range   Troponin I 3.99 (HH) <0.03 ng/mL    Comment: CRITICAL VALUE NOTED.  VALUE IS CONSISTENT WITH PREVIOUSLY REPORTED AND CALLED VALUE. Performed at [facility], [place] [address], [place], [place] [address]   Heparin level (unfractionated)     Status: None   Collection Time: 07/11/18 11:39 AM  Result Value Ref Range   Heparin Unfractionated 0.51 0.30 - 0.70 IU/mL    Comment: (NOTE) If heparin results are below expected values, and patient dosage has  been confirmed, suggest follow up testing of antithrombin III levels. Performed at [facility], [place] [address] [place], [place] [address]   Glucose, capillary     Status: Abnormal   Collection Time: 07/11/18 11:41 AM  Result Value Ref Range   Glucose-Capillary 127 (H) 70 - 99 mg/dL  Glucose, capillary     Status: Abnormal   Collection Time: 07/11/18  3:41 PM  Result Value Ref Range   Glucose-Capillary 184 (H) 70 -  99 mg/dL  Glucose, capillary     Status: Abnormal   Collection Time: 07/11/18  9:17 PM  Result Value Ref Range   Glucose-Capillary 168 (H) 70 - 99 mg/dL  CBC with Differential/Platelet     Status: Abnormal   Collection Time: 07/12/18  3:49 AM  Result Value Ref Range   WBC 5.4 4.0 - 10.5 K/uL   RBC 3.39 (L) 3.87 - 5.11 MIL/uL   Hemoglobin 8.9 (L) 12.0 - 15.0 g/dL   HCT 28.7 (L) 36.0 - 46.0 %   MCV 84.7 78.0 - 100.0 fL   MCH 26.3 26.0 - 34.0 pg   MCHC 31.0 30.0 - 36.0 g/dL   RDW 15.9 (H) 11.5 - 15.5 %   Platelets 329 150 - 400  K/uL   Neutrophils Relative % 64 %   Neutro Abs 3.5 1.7 - 7.7 K/uL   Lymphocytes Relative 17 %   Lymphs Abs 0.9 0.7 - 4.0 K/uL   Monocytes Relative 14 %   Monocytes Absolute 0.8 0.1 - 1.0 K/uL   Eosinophils Relative 3 %   Eosinophils Absolute 0.1 0.0 - 0.7 K/uL   Basophils Relative 1 %   Basophils Absolute 0.0 0.0 - 0.1 K/uL   Immature Granulocytes 1 %   Abs Immature Granulocytes 0.0 0.0 - 0.1 K/uL    Comment: Performed at [facility], [address], [place], [place] [address]  Basic metabolic panel     Status: Abnormal   Collection Time: 07/12/18  3:49 AM  Result Value Ref Range   Sodium 136 135 - 145 mmol/L   Potassium 4.1 3.5 - 5.1 mmol/L   Chloride 107 98 - 111 mmol/L    Comment: Please note change in reference range.   CO2 21 (L) 22 - 32 mmol/L   Glucose, Bld 125 (H) 70 - 99 mg/dL    Comment: Please note change in reference range.   BUN 42 (H) 8 - 23 mg/dL    Comment: Please note change in reference range.   Creatinine, Ser 2.26 (H) 0.44 - 1.00 mg/dL   Calcium 7.8 (L) 8.9 - 10.3 mg/dL   GFR calc non Af Amer 19 (L) >60 mL/min   GFR calc Af Amer 22 (L) >60 mL/min    Comment: (NOTE) The eGFR has been calculated using the CKD EPI equation. This calculation has not been validated in all clinical situations. eGFR's persistently <60 mL/min signify possible Chronic Kidney Disease.    Anion gap 8 5 - 15    Comment: Performed at [facility] [address], [place], [place] [address]  Glucose, capillary     Status: Abnormal   Collection Time: 07/12/18  7:53 AM  Result Value Ref Range   Glucose-Capillary 106 (H) 70 - 99 mg/dL  Heparin level (unfractionated)     Status: Abnormal   Collection Time: 07/12/18 10:17 AM  Result Value Ref Range   Heparin Unfractionated 0.29 (L) 0.30 - 0.70 IU/mL    Comment: (NOTE) If heparin results are below expected values, and patient dosage has  been confirmed, suggest follow up testing of antithrombin III levels. Performed at  [facility], [place] [address], [place], [place] [address]   Ferritin     Status: Abnormal   Collection Time: 07/12/18 11:31 AM  Result Value Ref Range   Ferritin 1 (L) 11 - 307 ng/mL    Comment: Performed at [facility], [place] [address], [place], [place] [address]  Iron  and TIBC     Status: Abnormal   Collection Time: 07/12/18 11:31 AM  Result Value Ref Range   Iron 22 (L) 28 - 170 ug/dL   TIBC 252 250 - 450 ug/dL   Saturation Ratios 9 (L) 10.4 - 31.8 %   UIBC 230 ug/dL    Comment: Performed at [facility], [place] [address], [place], [place] [address]  Transferrin     Status: Abnormal   Collection Time: 07/12/18 11:31 AM  Result Value Ref Range   Transferrin 180 (L) 192 - 382 mg/dL    Comment: Performed at [facility] [address], [place], [place] [address]  Glucose, capillary     Status: Abnormal   Collection Time: 07/12/18 12:00 PM  Result Value Ref Range   Glucose-Capillary 105 (H) 70 - 99 mg/dL   US Renal  Result Date: 07/11/2018 CLINICAL DATA:  Initial evaluation for acute renal injury. Status post right nephrectomy. EXAM: RENAL / URINARY TRACT ULTRASOUND COMPLETE COMPARISON:  None. FINDINGS: Right Kidney: Surgically absent. Left Kidney: Length: 10.4 cm. Echogenicity within normal limits. No hydronephrosis. 1.6 x 1.5 x 1.4 cm cyst present at the upper pole. Additional 1.1 x 0.8 x 1.0 cyst at the upper pole. Bladder: Appears normal for degree of bladder distention. Left jet visualized. IMPRESSION: 1. Left kidney demonstrates no hydronephrosis or other acute abnormality. 2. Left renal cysts as above. 3. Prior right nephrectomy. Electronically Signed   By: Jeannine Boga M.D.   On: 07/11/2018 23:42   Assessment: Gabrielle [name] who is a 83 y.o. female w/ a PMHx notable for G1DD w/ an LVEF of 55%, RCC s/p nephrectomy, CKD III, and prior CVA with mild baseline cognitive decline who presented to the ED for generalized weakness. She endorsed general  unwellness for several days prior to admission.  Plan: 1.  Prerenal acute renal injury on CKD stage IV:  We feel this is most likely secondary to hypotension/dehydration with concurrent angiotensin-converting enzyme inhibitor use. Although this is improving with this morning's creatinine 2.26, down from 2.77, it is not near her baseline of ~1.7.  We feel that allowing time for kidneys to improve prior to the cardiac catheterization is preferred and would recommend avoiding nephrotoxic agents until after the weekend to allow the patient's renal function to return to baseline. -Agree with holding the patient's lisinopril -Agree with normal saline 75 mL's an hour -Recommend BMP's daily to monitor renal function  2.  NSTEMI: Patient troponin elevated greater than, EKG getting less than 1 mm of ST depression in inferior lateral leads.  Cardiology is planning for cardiac catheterization with staged PCI.  Echocardiogram with preserved LVEF noting grade 1 diastolic dysfunction.  There is no evidence of focal hypokinesis  3.  HTN: Patient's blood pressure stable 130/58.  We will continue to hold her lisinopril given her acute renal injury and monitor blood pressure has been giving her fluids.  4. Insulin independent DMII: As per primary team.   Kathi Ludwig, MD Internal Medicine PGY-2 Pager # [phone]  Please see attending note/attestation for current assessment and plan.  Patient seen and examined, agree with above note with above modifications. Pleasant 83 year old WF with HTN on ACE and with what appears to be baseline CKD- crt 1.7.  She presented with weakness- found to have elevated troponin but with preserved EF.  Initial creatinine was 2.7 but improved to 2.2 today with conservative management and holding of ACE-I. She is overall feeling better.  Cardiac cath is being contemplated and we were asked to see to assess for risk.    I think I would err on the side of caution and hold on  cardiac catheterization until Monday so as not to add another insult on to her most recent AKI.  Hopefully her renal function will be back to baseline by then  Corliss Parish, MD 07/12/2018

## 2018-07-12 NOTE — Progress Notes (Signed)
PROGRESS NOTE    Gabrielle [name]  BPZ:[id number] DOB: 1935-01-16 DOA: 07/11/2018 PCP: Forrest Moron, MD    Brief Narrative:  83 year old female who presented with chest pressure.  She does have a significant past medical history for diastolic heart failure, renal cell carcinoma status post nephrectomy, chronic kidney disease and history of CVA. Reported 3 days history of nausea, vomiting, decreased p.o. intake and 24 hours of diarrhea.  On the day of admission she developed chest pressure, moderate in intensity, lasting for about an hour with no radiation, associated with dyspnea.  She is able to do her routine activities without angina.  Had a stress test about a year ago, she has been without air conditioning for the last 3 days, she lost her husband about a year ago.  Initial physical examination blood pressure 144/52, heart rate 68, respiratory rate 14, temperature 98.3, oxygen saturation 100%.  Lungs clear to auscultation bilaterally, heart S1-S2 present and rhythmic, abdomen soft nontender, no lower extremity edema.  Sodium 134, potassium 3.6, chloride 101, bicarb 20, glucose 106, BUN 58, creatinine 2.7, troponin 4.49, white count 7.8, hemoglobin 9.1, hematocrit 28.7, platelets 365.  Troponin I 4.49/ 4.36.  EKG LVH and with inferior lateral ST depressions.  Patient was admitted to the hospital with working diagnosis of non-ST elevation myocardial infarction complicated with AKI on CKD stage IV.    Assessment & Plan:   Principal Problem:   NSTEMI (non-ST elevated myocardial infarction) ([place]) Active Problems:   Type 2 diabetes mellitus with hypoglycemia without coma (HCC)   Acute renal failure superimposed on chronic kidney disease ([place])   Anemia due to chronic kidney disease   Nausea, vomiting, and diarrhea  1.  Non-ST elevation myocardial infarction (inferior lateral ST depression). No further chest pain, patient tolerating well b blockade, continue to hold on ace inh due to AKI.  Continue medical therapy with IV heparin (for 48 hours), antiplatelet therapy ( asa and clopidogrel), and atorvastatin. Echocardiogram with preserved LV function with no wall motion abnormalities. Will follow EKG today. Troponin 3,99 yesterday. Patient with high pretest probability for CAD, will likely need ischemic workup once renal function improves.   2. Pre-renal AKI on CKD stage IV (sp nephrectomy). Renal US with no hydronephrosis or cortical atrophy, urine output over last 24 hours, 1,050 ml, serum cr down to 2,26 with K at 4,1 and serum bicarbonate at 21. Clinically still dry, will increase fluids rate to 100 ml per hour of normal saline. Patient may need contrast for possible coronary angiography.    3. HTN. Blood pressure stable with systolic 242 and 353, will continue close monitoring.   4. T2DM with hypoglycemia. Patient tolerating po well, capillary glucose 111, 127, 184, 168, 106, tolerating po well, will hold on IV dextrose for now.    DVT prophylaxis: IV heparin   Code Status:  full Family Communication: no family at the bedside  Disposition Plan/ discharge barriers: Pending cardiac workup.    Consultants:   Cardiology   Procedures:     Antimicrobials:       Subjective: Patient is feeling well, no dyspnea or chest pain, no nausea or vomiting. No further diarrhea.   Objective: Vitals:   07/11/18 2011 07/11/18 2337 07/12/18 0401 07/12/18 0755  BP: (!) 114/48 (!) 134/51 (!) 128/47 (!) 138/58  Pulse: 82 70 69 62  Resp: 20 16 15 16   Temp: 99.5 F (37.5 C) 98.6 F (37 C) 98.8 F (37.1 C) 98.7 F (37.1 C)  TempSrc:  Oral Oral Oral Oral  SpO2: 99% 100% 99% 98%  Weight:   56.4 kg (124 lb 4.8 oz)   Height:        Intake/Output Summary (Last 24 hours) at 07/12/2018 0824 Last data filed at 07/12/2018 0600 Gross per 24 hour  Intake 2729.38 ml  Output 750 ml  Net 1979.38 ml   Filed Weights   07/11/18 0135 07/12/18 0401  Weight: 56 kg (123 lb 7.3 oz)  56.4 kg (124 lb 4.8 oz)    Examination:   General: Not in pain or dyspnea, deconditioned  Neurology: Awake and alert, non focal  E ENT: mild pallor, no icterus, oral mucosa dry.  Cardiovascular: No JVD. S1-S2 present, no S3 or S4, rhythmic, no gallops, rubs, or murmurs. No lower extremity edema. Pulmonary: vesicular breath sounds bilaterally, adequate air movement, no wheezing, rhonchi or rales. Gastrointestinal. Abdomen with no organomegaly, non tender, no rebound or guarding Skin. No rashes Musculoskeletal: no joint deformities     Data Reviewed: I have personally reviewed following labs and imaging studies  CBC: Recent Labs  Lab 07/11/18 0250 07/12/18 0349  WBC 7.8 5.4  NEUTROABS 5.5 3.5  HGB 9.1* 8.9*  HCT 28.7* 28.7*  MCV 84.7 84.7  PLT 365 672   Basic Metabolic Panel: Recent Labs  Lab 07/11/18 0250 07/12/18 0349  NA 134* 136  K 3.6 4.1  CL 101 107  CO2 20* 21*  GLUCOSE 106* 125*  BUN 58* 42*  CREATININE 2.77* 2.26*  CALCIUM 8.0* 7.8*   GFR: Estimated Creatinine Clearance: 15 mL/min (A) (by C-G formula based on SCr of 2.26 mg/dL (H)). Liver Function Tests: Recent Labs  Lab 07/11/18 0250  AST 21  ALT 14  ALKPHOS 49  BILITOT 0.5  PROT 5.7*  ALBUMIN 3.0*   No results for input(s): LIPASE, AMYLASE in the last 168 hours. No results for input(s): AMMONIA in the last 168 hours. Coagulation Profile: No results for input(s): INR, PROTIME in the last 168 hours. Cardiac Enzymes: Recent Labs  Lab 07/11/18 0250 07/11/18 0543 07/11/18 0842  TROPONINI 4.49* 4.36* 3.99*   BNP (last 3 results) No results for input(s): PROBNP in the last 8760 hours. HbA1C: No results for input(s): HGBA1C in the last 72 hours. CBG: Recent Labs  Lab 07/11/18 0151 07/11/18 0741 07/11/18 1141 07/11/18 1541 07/11/18 2117  GLUCAP 107* 111* 127* 184* 168*   Lipid Profile: No results for input(s): CHOL, HDL, LDLCALC, TRIG, CHOLHDL, LDLDIRECT in the last 72  hours. Thyroid Function Tests: No results for input(s): TSH, T4TOTAL, FREET4, T3FREE, THYROIDAB in the last 72 hours. Anemia Panel: No results for input(s): VITAMINB12, FOLATE, FERRITIN, TIBC, IRON, RETICCTPCT in the last 72 hours.    Radiology Studies: I have reviewed all of the imaging during this hospital visit personally     Scheduled Meds: . aspirin EC  81 mg Oral Daily  . atorvastatin  40 mg Oral q1800  . carvedilol  3.125 mg Oral BID WC  . clopidogrel  75 mg Oral Daily  . insulin aspart  0-9 Units Subcutaneous TID WC   Continuous Infusions: . dextrose 5 % and 0.45% NaCl 75 mL/hr at 07/12/18 0709  . heparin 650 Units/hr (07/12/18 0711)     LOS: 1 day        Gabrielle Giannotti Gerome Apley, MD [facility] Pager [phone]

## 2018-07-12 NOTE — Progress Notes (Signed)
Progress Note  Patient Name: Gabrielle [name] Date of Encounter: 07/12/2018  Primary Cardiologist: Quay Burow, MD   Subjective   Patient reports feeling fatigued this morning. Has occasional SOB. She denies recurrence of chest pain/tightness.   Inpatient Medications    Scheduled Meds: . aspirin EC  81 mg Oral Daily  . atorvastatin  40 mg Oral q1800  . carvedilol  3.125 mg Oral BID WC  . clopidogrel  75 mg Oral Daily  . insulin aspart  0-9 Units Subcutaneous TID WC   Continuous Infusions: . sodium chloride 100 mL/hr at 07/12/18 0922  . heparin 650 Units/hr (07/12/18 0711)   PRN Meds: acetaminophen, nitroGLYCERIN, ondansetron (ZOFRAN) IV   Vital Signs    Vitals:   07/11/18 2011 07/11/18 2337 07/12/18 0401 07/12/18 0755  BP: (!) 114/48 (!) 134/51 (!) 128/47 (!) 138/58  Pulse: 82 70 69 62  Resp: 20 16 15 16   Temp: 99.5 F (37.5 C) 98.6 F (37 C) 98.8 F (37.1 C) 98.7 F (37.1 C)  TempSrc: Oral Oral Oral Oral  SpO2: 99% 100% 99% 98%  Weight:   124 lb 4.8 oz (56.4 kg)   Height:        Intake/Output Summary (Last 24 hours) at 07/12/2018 0954 Last data filed at 07/12/2018 2094 Gross per 24 hour  Intake 2964.49 ml  Output 750 ml  Net 2214.49 ml   Filed Weights   07/11/18 0135 07/12/18 0401  Weight: 123 lb 7.3 oz (56 kg) 124 lb 4.8 oz (56.4 kg)    Telemetry    NSR/sinus bradycardia - Personally Reviewed  Physical Exam   GEN: Sleeping but arouses easily. Laying in bed in no acute distress.  Neck: No JVD, no carotid bruits Cardiac: RRR, no murmurs, rubs, or gallops.  Respiratory: Clear to auscultation bilaterally, no wheezes/ rales/ rhonchi GI: NABS, Soft, nontender, non-distended  MS: No edema; No deformity. Neuro:  Nonfocal, moving all extremities spontaneously Psych: Normal affect   Labs    Chemistry Recent Labs  Lab 07/11/18 0250 07/12/18 0349  NA 134* 136  K 3.6 4.1  CL 101 107  CO2 20* 21*  GLUCOSE 106* 125*  BUN 58* 42*  CREATININE  2.77* 2.26*  CALCIUM 8.0* 7.8*  PROT 5.7*  --   ALBUMIN 3.0*  --   AST 21  --   ALT 14  --   ALKPHOS 49  --   BILITOT 0.5  --   GFRNONAA 15* 19*  GFRAA 18* 22*  ANIONGAP 13 8     Hematology Recent Labs  Lab 07/11/18 0250 07/12/18 0349  WBC 7.8 5.4  RBC 3.39* 3.39*  HGB 9.1* 8.9*  HCT 28.7* 28.7*  MCV 84.7 84.7  MCH 26.8 26.3  MCHC 31.7 31.0  RDW 15.9* 15.9*  PLT 365 329    Cardiac Enzymes Recent Labs  Lab 07/11/18 0250 07/11/18 0543 07/11/18 0842  TROPONINI 4.49* 4.36* 3.99*   No results for input(s): TROPIPOC in the last 168 hours.   BNPNo results for input(s): BNP, PROBNP in the last 168 hours.   DDimer No results for input(s): DDIMER in the last 168 hours.   Radiology    US Renal  Result Date: 07/11/2018 CLINICAL DATA:  Initial evaluation for acute renal injury. Status post right nephrectomy. EXAM: RENAL / URINARY TRACT ULTRASOUND COMPLETE COMPARISON:  None. FINDINGS: Right Kidney: Surgically absent. Left Kidney: Length: 10.4 cm. Echogenicity within normal limits. No hydronephrosis. 1.6 x 1.5 x 1.4 cm cyst present at  the upper pole. Additional 1.1 x 0.8 x 1.0 cyst at the upper pole. Bladder: Appears normal for degree of bladder distention. Left jet visualized. IMPRESSION: 1. Left kidney demonstrates no hydronephrosis or other acute abnormality. 2. Left renal cysts as above. 3. Prior right nephrectomy. Electronically Signed   By: Jeannine Boga M.D.   On: 07/11/2018 23:42    Cardiac Studies   Echocardiogram 07/11/18: Study Conclusions  - Left ventricle: The cavity size was normal. There was moderate   concentric hypertrophy. Systolic function was normal. The   estimated ejection fraction was in the range of 55% to 60%. Wall   motion was normal; there were no regional wall motion   abnormalities. Doppler parameters are consistent with abnormal   left ventricular relaxation (grade 1 diastolic dysfunction).   Doppler parameters are consistent with high  ventricular filling   pressure. - Aortic valve: Transvalvular velocity was within the normal range.   There was no stenosis. There was trivial regurgitation. - Mitral valve: Calcified annulus. Transvalvular velocity was   within the normal range. There was no evidence for stenosis.   There was mild regurgitation. - Right ventricle: The cavity size was normal. Wall thickness was   normal. Systolic function was normal. - Atrial septum: No defect or patent foramen ovale was identified   by color flow Doppler. - Tricuspid valve: There was trivial regurgitation. - Pulmonary arteries: Systolic pressure was within the normal   range. PA peak pressure: 29 mm Hg (S).  Patient Profile     83 y.o. female with PMH of PVOD s/p left carotid endaterectomy, renal artery stenting for RAS, bilateral SFA disease, as well as HTN, HLD, DM, h/o CVA, CKD, RCC s/p nephrectomy and chronic diastolic HF, who is being followed by cardiology for NSTEMI.  Assessment & Plan    1. NSTEMI: p/w SSCP c/f unstable angina. Trop trend 4.49>4.36>3.99. EKG with new inferolateral TWI. She was started on a heparin gtt for conservative management given acute on chronic renal insufficiency, with Cr 2.77 on admission, limiting ability to perform a LHC. Echo 7/17 with EF 55-60%, no wall motion abnormalities, and G1DD. Cr improved to 2.26 this morning (baseline 1.8). She continues to remain CP free. - Continue conservative management with heparin gtt, ASA, statin, BBlocker, and plavix - Continue prn nitro - Hopeful kidney function will return to baseline, at which time would consider LHC to further evaluate coronary anatomy   2. Acute on chronic CKD: s/p nephrectomy for RCC. Cr peaked at 2.77 (baseline appears to be 1.8). Thought to be pre-renal given recent diarrhea and hot environment in the setting of a broken AC unit. Renal US without hydronephrosis. She was recommended for a Nephrology consult yesterday. She has been on IVF and Cr  has improved to 2.26 today.  - Continue IVF resuscitation  - Monitor volume status closely with history of chronic diastolic CHF  3. HTN: BP stable. Home lisinopril on hold for acute on chronic renal insufficiency. - Continue current regimen  4. DM type 2: - Continue management per primary team  5. HLD: LDL 118 09/2017; goal of <70.  - Continue statin  For questions or updates, please contact [place] Please consult [email or web address] for contact info under Cardiology/STEMI.      Signed, Abigail Butts, PA-C  07/12/2018, 9:54 AM   [phone]

## 2018-07-13 ENCOUNTER — Encounter (HOSPITAL_COMMUNITY)
Admission: AD | Disposition: A | Discharge status: Skilled Nursing Facility | Payer: Self-pay | Source: Other Acute Inpatient Hospital | Attending: Internal Medicine

## 2018-07-13 ENCOUNTER — Other Ambulatory Visit: Payer: Self-pay

## 2018-07-13 DIAGNOSIS — N179 Acute kidney failure, unspecified: Secondary | ICD-10-CM

## 2018-07-13 DIAGNOSIS — D509 Iron deficiency anemia, unspecified: Secondary | ICD-10-CM

## 2018-07-13 DIAGNOSIS — I25119 Atherosclerotic heart disease of native coronary artery with unspecified angina pectoris: Secondary | ICD-10-CM

## 2018-07-13 DIAGNOSIS — I2510 Atherosclerotic heart disease of native coronary artery without angina pectoris: Secondary | ICD-10-CM

## 2018-07-13 HISTORY — PX: LEFT HEART CATH AND CORONARY ANGIOGRAPHY: CATH118249

## 2018-07-13 LAB — CBC
HCT: 27 % — ABNORMAL LOW (ref 36.0–46.0)
Hemoglobin: 8.5 g/dL — ABNORMAL LOW (ref 12.0–15.0)
MCH: 26.8 pg (ref 26.0–34.0)
MCHC: 31.5 g/dL (ref 30.0–36.0)
MCV: 85.2 fL (ref 78.0–100.0)
Platelets: 277 10*3/uL (ref 150–400)
RBC: 3.17 MIL/uL — ABNORMAL LOW (ref 3.87–5.11)
RDW: 15.8 % — ABNORMAL HIGH (ref 11.5–15.5)
WBC: 4.2 10*3/uL (ref 4.0–10.5)

## 2018-07-13 LAB — BASIC METABOLIC PANEL
Anion gap: 6 (ref 5–15)
BUN: 31 mg/dL — ABNORMAL HIGH (ref 8–23)
CO2: 20 mmol/L — ABNORMAL LOW (ref 22–32)
Calcium: 7.8 mg/dL — ABNORMAL LOW (ref 8.9–10.3)
Chloride: 112 mmol/L — ABNORMAL HIGH (ref 98–111)
Creatinine, Ser: 1.81 mg/dL — ABNORMAL HIGH (ref 0.44–1.00)
GFR calc Af Amer: 29 mL/min — ABNORMAL LOW (ref >60)
GFR calc non Af Amer: 25 mL/min — ABNORMAL LOW (ref >60)
Glucose, Bld: 87 mg/dL (ref 70–99)
Potassium: 4.4 mmol/L (ref 3.5–5.1)
Sodium: 138 mmol/L (ref 135–145)

## 2018-07-13 LAB — GLUCOSE, CAPILLARY
Glucose-Capillary: 84 mg/dL (ref 70–99)
Glucose-Capillary: 90 mg/dL (ref 70–99)
Glucose-Capillary: 107 mg/dL — ABNORMAL HIGH (ref 70–99)
Glucose-Capillary: 150 mg/dL — ABNORMAL HIGH (ref 70–99)

## 2018-07-13 LAB — HEPARIN LEVEL (UNFRACTIONATED): Heparin Unfractionated: 0.47 IU/mL (ref 0.30–0.70)

## 2018-07-13 SURGERY — LEFT HEART CATH AND CORONARY ANGIOGRAPHY
Anesthesia: LOCAL

## 2018-07-13 MED ORDER — SODIUM CHLORIDE 0.9 % IV SOLN
INTRAVENOUS | Status: AC
  Administered 2018-07-13: 15:00:00 via INTRAVENOUS

## 2018-07-13 MED ORDER — SODIUM CHLORIDE 0.9% FLUSH: 3.0000 mL | Freq: Two times a day (BID) | INTRAVENOUS | Status: DC

## 2018-07-13 MED ORDER — HYDROMORPHONE HCL 1 MG/ML IJ SOLN
INTRAMUSCULAR | Status: DC | PRN
  Administered 2018-07-13: 0.5 mg via INTRAVENOUS

## 2018-07-13 MED ORDER — AMLODIPINE BESYLATE 5 MG PO TABS
5.0000 mg | ORAL_TABLET | Freq: Every day | ORAL | Status: DC
  Administered 2018-07-13 – 2018-07-19 (×7): 5 mg via ORAL
  Filled 2018-07-13 (×7): qty 1

## 2018-07-13 MED ORDER — MIDAZOLAM HCL 2 MG/2ML IJ SOLN
INTRAMUSCULAR | Status: AC
  Filled 2018-07-13: qty 2

## 2018-07-13 MED ORDER — HEPARIN (PORCINE) IN NACL 1000-0.9 UT/500ML-% IV SOLN
INTRAVENOUS | Status: DC | PRN
  Administered 2018-07-13 (×2): 500 mL

## 2018-07-13 MED ORDER — ASPIRIN 81 MG PO CHEW: 81.0000 mg | CHEWABLE_TABLET | Freq: Every day | ORAL | Status: DC

## 2018-07-13 MED ORDER — SODIUM CHLORIDE 0.9% FLUSH: 3.0000 mL | INTRAVENOUS | Status: DC | PRN

## 2018-07-13 MED ORDER — HEPARIN (PORCINE) IN NACL 2-0.9 UNITS/ML
INTRAMUSCULAR | Status: DC | PRN
  Administered 2018-07-13: 10 mL via INTRA_ARTERIAL

## 2018-07-13 MED ORDER — DEXTROSE-NACL 5-0.9 % IV SOLN
INTRAVENOUS | Status: DC
  Administered 2018-07-13 – 2018-07-16 (×4): via INTRAVENOUS

## 2018-07-13 MED ORDER — HEPARIN SODIUM (PORCINE) 1000 UNIT/ML IJ SOLN
INTRAMUSCULAR | Status: DC | PRN
  Administered 2018-07-13: 3000 [IU] via INTRAVENOUS

## 2018-07-13 MED ORDER — ONDANSETRON HCL 4 MG/2ML IJ SOLN: 4.0000 mg | Freq: Four times a day (QID) | INTRAMUSCULAR | Status: DC | PRN

## 2018-07-13 MED ORDER — LABETALOL HCL 5 MG/ML IV SOLN: 10.0000 mg | INTRAVENOUS | Status: AC | PRN

## 2018-07-13 MED ORDER — HEPARIN (PORCINE) IN NACL 1000-0.9 UT/500ML-% IV SOLN
INTRAVENOUS | Status: AC
  Filled 2018-07-13: qty 1000

## 2018-07-13 MED ORDER — VERAPAMIL HCL 2.5 MG/ML IV SOLN
INTRAVENOUS | Status: AC
  Filled 2018-07-13: qty 2

## 2018-07-13 MED ORDER — ASPIRIN 81 MG PO CHEW
81.0000 mg | CHEWABLE_TABLET | ORAL | Status: AC
  Administered 2018-07-13: 81 mg via ORAL
  Filled 2018-07-13: qty 1

## 2018-07-13 MED ORDER — IOHEXOL 350 MG/ML SOLN
INTRAVENOUS | Status: DC | PRN
  Administered 2018-07-13: 50 mL

## 2018-07-13 MED ORDER — HYDRALAZINE HCL 20 MG/ML IJ SOLN: 5.0000 mg | INTRAMUSCULAR | Status: AC | PRN

## 2018-07-13 MED ORDER — LIDOCAINE HCL (PF) 1 % IJ SOLN
INTRAMUSCULAR | Status: AC
  Filled 2018-07-13: qty 30

## 2018-07-13 MED ORDER — HEPARIN SODIUM (PORCINE) 1000 UNIT/ML IJ SOLN
INTRAMUSCULAR | Status: AC
  Filled 2018-07-13: qty 1

## 2018-07-13 MED ORDER — HEPARIN (PORCINE) IN NACL 100-0.45 UNIT/ML-% IJ SOLN
800.0000 [IU]/h | INTRAMUSCULAR | Status: DC
  Administered 2018-07-13: 750 [IU]/h via INTRAVENOUS
  Administered 2018-07-15: 800 [IU]/h via INTRAVENOUS
  Filled 2018-07-13 (×2): qty 250

## 2018-07-13 MED ORDER — SODIUM CHLORIDE 0.9 % IV SOLN: 250.0000 mL | INTRAVENOUS | Status: DC | PRN

## 2018-07-13 MED ORDER — SODIUM CHLORIDE 0.9% FLUSH
3.0000 mL | Freq: Two times a day (BID) | INTRAVENOUS | Status: DC
  Administered 2018-07-13 – 2018-07-19 (×9): 3 mL via INTRAVENOUS

## 2018-07-13 MED ORDER — HYDROMORPHONE HCL 1 MG/ML IJ SOLN
INTRAMUSCULAR | Status: AC
  Filled 2018-07-13: qty 0.5

## 2018-07-13 MED ORDER — SODIUM CHLORIDE 0.9 % WEIGHT BASED INFUSION: 3.0000 mL/kg/h | INTRAVENOUS | Status: DC

## 2018-07-13 MED ORDER — ACETAMINOPHEN 325 MG PO TABS
650.0000 mg | ORAL_TABLET | ORAL | Status: DC | PRN
  Administered 2018-07-18 – 2018-07-19 (×2): 650 mg via ORAL

## 2018-07-13 MED ORDER — SODIUM CHLORIDE 0.9 % WEIGHT BASED INFUSION: 1.0000 mL/kg/h | INTRAVENOUS | Status: DC

## 2018-07-13 MED ORDER — MIDAZOLAM HCL 2 MG/2ML IJ SOLN
INTRAMUSCULAR | Status: DC | PRN
  Administered 2018-07-13: 1 mg via INTRAVENOUS

## 2018-07-13 MED ORDER — LIDOCAINE HCL (PF) 1 % IJ SOLN
INTRAMUSCULAR | Status: DC | PRN
  Administered 2018-07-13: 5 mL

## 2018-07-13 SURGICAL SUPPLY — 10 items
CATH 5FR JL3.5 JR4 ANG PIG MP (CATHETERS) ×2 IMPLANT
CATH INFINITI 5 FR 3DRC (CATHETERS) ×2 IMPLANT
DEVICE RAD COMP TR BAND LRG (VASCULAR PRODUCTS) ×2 IMPLANT
GLIDESHEATH SLEND SS 6F .021 (SHEATH) ×2 IMPLANT
GUIDEWIRE INQWIRE 1.5J.035X260 (WIRE) ×1 IMPLANT
INQWIRE 1.5J .035X260CM (WIRE) ×2
KIT HEART LEFT (KITS) ×2 IMPLANT
PACK CARDIAC CATHETERIZATION (CUSTOM PROCEDURE TRAY) ×2 IMPLANT
TRANSDUCER W/STOPCOCK (MISCELLANEOUS) ×2 IMPLANT
TUBING CIL FLEX 10 FLL-RA (TUBING) ×2 IMPLANT

## 2018-07-13 NOTE — Progress Notes (Signed)
ANTICOAGULATION CONSULT NOTE - Follow Up Consult  Pharmacy Consult for Heparin Indication: chest pain/ACS  Allergies  Allergen Reactions  . Amlodipine Itching  . Codeine Nausea And Vomiting  . Coreg [Carvedilol] Nausea And Vomiting  . Fentanyl Other (See Comments)    unknown  . Lacosamide Other (See Comments)     Other name is, VIMPAT unknown  . Levetiracetam Other (See Comments)    Strange feelings in head  . Metformin And Related Other (See Comments)    unknown  . Metoprolol Other (See Comments)    unknown  . Morphine And Related Itching  . Oxybutynin Swelling    mouth  . Sertraline Nausea Only and Other (See Comments)    Swelling in mouth  . Tessalon [Benzonatate] Other (See Comments)    unknown  . Other Rash    BETA BLOCKER    Patient Measurements: Height: 5\' 1"  (154.9 cm) Weight: 126 lb 14.4 oz (57.6 kg) IBW/kg (Calculated) : 47.8 Heparin Dosing Weight: 56 kg  Vital Signs: Temp: 99.3 F (37.4 C) (07/19 0755) Temp Source: Oral (07/19 0755) BP: 138/44 (07/19 0755) Pulse Rate: 61 (07/19 0755)  Labs: Recent Labs    07/11/18 0250 07/11/18 0543 07/11/18 1505  07/12/18 0349 07/12/18 1017 07/12/18 2021 07/13/18 0513  HGB 9.1*  --   --   --  8.9*  --   --  8.5*  HCT 28.7*  --   --   --  28.7*  --   --  27.0*  PLT 365  --   --   --  329  --   --  277  HEPARINUNFRC  --  0.60  --    < >  --  0.29* 0.46 0.47  CREATININE 2.77*  --   --   --  2.26*  --   --  1.81*  TROPONINI 4.49* 4.36* 3.99*  --   --   --   --   --    < > = values in this interval not displayed.    Estimated Creatinine Clearance: 20.2 mL/min (A) (by C-G formula based on SCr of 1.81 mg/dL (H)).   Medications:  Infusions:  . sodium chloride    . dextrose 5 % and 0.9% NaCl    . heparin 750 Units/hr (07/12/18 1230)    Assessment: 83 year old female who transferred from [place] with elevated troponin on IV Heparin infusion. Pharmacy was consulted to continue Heparin dosing for  ACS. Pt not on anticoagulation PTA. Scheduled for LHC today at 1130.   Heparin level today at goal, CBC stable, no s/sx of bleeding noted.  Goal of Therapy:  Heparin level 0.3-0.7 units/ml Monitor platelets by anticoagulation protocol: Yes   Plan:  Continue heparin at 750 units/hr Monitor daily Heparin level and CBC.  Thank you for involving pharmacy in this patient's care.  Janae Bridgeman, PharmD PGY1 Pharmacy Resident Phone: [phone] 07/13/2018 10:27 AM

## 2018-07-13 NOTE — Progress Notes (Signed)
Progress Note  Patient Name: Gabrielle [name] Date of Encounter: 07/13/2018  Primary Cardiologist: Quay Burow, MD   Subjective   Feeling tired this morning. Reports not sleeping well. Denies CP or SOB.   Inpatient Medications    Scheduled Meds: . aspirin EC  81 mg Oral Daily  . atorvastatin  40 mg Oral q1800  . carvedilol  3.125 mg Oral BID WC  . clopidogrel  75 mg Oral Daily  . sodium chloride flush  3 mL Intravenous Q12H   Continuous Infusions: . sodium chloride    . dextrose 5 % and 0.9% NaCl    . heparin 750 Units/hr (07/12/18 1230)   PRN Meds: sodium chloride, acetaminophen, nitroGLYCERIN, ondansetron (ZOFRAN) IV, sodium chloride flush, zolpidem   Vital Signs    Vitals:   07/12/18 2036 07/13/18 0442 07/13/18 0500 07/13/18 0755  BP: (!) 128/43 (!) 152/55  (!) 138/44  Pulse: 66 68  61  Resp: 12 16  13   Temp: 98.4 F (36.9 C) 98.5 F (36.9 C)  99.3 F (37.4 C)  TempSrc: Oral Oral  Oral  SpO2: 100% 97%  100%  Weight:   126 lb 14.4 oz (57.6 kg)   Height:        Intake/Output Summary (Last 24 hours) at 07/13/2018 0928 Last data filed at 07/13/2018 0655 Gross per 24 hour  Intake 2757.44 ml  Output 750 ml  Net 2007.44 ml   Filed Weights   07/11/18 0135 07/12/18 0401 07/13/18 0500  Weight: 123 lb 7.3 oz (56 kg) 124 lb 4.8 oz (56.4 kg) 126 lb 14.4 oz (57.6 kg)    Telemetry    NSR with brief episode of SVT - Personally Reviewed  Physical Exam   GEN: Laying in bed in no acute distress.   Neck: No JVD, no carotid bruits Cardiac: RRR, no murmurs, rubs, or gallops.  Respiratory: Clear to auscultation bilaterally, no wheezes/ rales/ rhonchi GI: NABS, Soft, nontender, non-distended  MS: No edema; No deformity. Neuro:  Nonfocal, moving all extremities spontaneously Psych: Normal affect   Labs    Chemistry Recent Labs  Lab 07/11/18 0250 07/12/18 0349 07/13/18 0513  NA 134* 136 138  K 3.6 4.1 4.4  CL 101 107 112*  CO2 20* 21* 20*  GLUCOSE 106*  125* 87  BUN 58* 42* 31*  CREATININE 2.77* 2.26* 1.81*  CALCIUM 8.0* 7.8* 7.8*  PROT 5.7*  --   --   ALBUMIN 3.0*  --   --   AST 21  --   --   ALT 14  --   --   ALKPHOS 49  --   --   BILITOT 0.5  --   --   GFRNONAA 15* 19* 25*  GFRAA 18* 22* 29*  ANIONGAP 13 8 6      Hematology Recent Labs  Lab 07/11/18 0250 07/12/18 0349 07/13/18 0513  WBC 7.8 5.4 4.2  RBC 3.39* 3.39* 3.17*  HGB 9.1* 8.9* 8.5*  HCT 28.7* 28.7* 27.0*  MCV 84.7 84.7 85.2  MCH 26.8 26.3 26.8  MCHC 31.7 31.0 31.5  RDW 15.9* 15.9* 15.8*  PLT 365 329 277    Cardiac Enzymes Recent Labs  Lab 07/11/18 0250 07/11/18 0543 07/11/18 0842  TROPONINI 4.49* 4.36* 3.99*   No results for input(s): TROPIPOC in the last 168 hours.   BNPNo results for input(s): BNP, PROBNP in the last 168 hours.   DDimer No results for input(s): DDIMER in the last 168 hours.   Radiology  US Renal  Result Date: 07/11/2018 CLINICAL DATA:  Initial evaluation for acute renal injury. Status post right nephrectomy. EXAM: RENAL / URINARY TRACT ULTRASOUND COMPLETE COMPARISON:  None. FINDINGS: Right Kidney: Surgically absent. Left Kidney: Length: 10.4 cm. Echogenicity within normal limits. No hydronephrosis. 1.6 x 1.5 x 1.4 cm cyst present at the upper pole. Additional 1.1 x 0.8 x 1.0 cyst at the upper pole. Bladder: Appears normal for degree of bladder distention. Left jet visualized. IMPRESSION: 1. Left kidney demonstrates no hydronephrosis or other acute abnormality. 2. Left renal cysts as above. 3. Prior right nephrectomy. Electronically Signed   By: Jeannine Boga M.D.   On: 07/11/2018 23:42    Cardiac Studies   Echocardiogram 07/11/18: Study Conclusions  - Left ventricle: The cavity size was normal. There was moderate concentric hypertrophy. Systolic function was normal. The estimated ejection fraction was in the range of 55% to 60%. Wall motion was normal; there were no regional wall motion abnormalities. Doppler  parameters are consistent with abnormal left ventricular relaxation (grade 1 diastolic dysfunction). Doppler parameters are consistent with high ventricular filling pressure. - Aortic valve: Transvalvular velocity was within the normal range. There was no stenosis. There was trivial regurgitation. - Mitral valve: Calcified annulus. Transvalvular velocity was within the normal range. There was no evidence for stenosis. There was mild regurgitation. - Right ventricle: The cavity size was normal. Wall thickness was normal. Systolic function was normal. - Atrial septum: No defect or patent foramen ovale was identified by color flow Doppler. - Tricuspid valve: There was trivial regurgitation. - Pulmonary arteries: Systolic pressure was within the normal range. PA peak pressure: 29 mm Hg (S).    Patient Profile     83 y.o. female with PMH of PVOD s/pleftcarotid endaterectomy, renal artery stentingfor RAS, bilateral SFA disease, as well as HTN, HLD, DM, h/o CVA, CKD, RCC s/p nephrectomy and chronic diastolic HF, who is being followed by cardiology for NSTEMI.   Assessment & Plan    1. NSTEMI: p/w SSCP c/f unstable angina. Trop trend 4.49>4.36>3.99. EKG with new inferolateral TWI. She was started on a heparin gtt for conservative management given acute on chronic renal insufficiency, with Cr 2.77 on admission, limiting ability to perform a LHC. Echo 7/17 with EF 55-60%, no wall motion abnormalities, and G1DD. Cr improved to about baseline this morning - 1.81. She continues to remain CP free. Risks and benefits of LHC discussed. The patient understands that risks included but are not limited to stroke (1 in 1000), death (1 in 34), kidney failure [usually temporary] (1 in 500), bleeding (1 in 200), allergic reaction [possibly serious] (1 in 200).  The patient understands and agrees to proceed.  - Will plan for LHC today to further evaluate coronary anatomy - Continue heparin  gtt, ASA, statin, BBlocker, and plavix  2. Acute on chronic CKD: Cr peaked at 2.77 and improved to about baseline today at 1.81. Likely pre-renal. She has been receiving IVF and Nephrology is following. Okay for LHC today.  - Continue IVF pre/post-cath to minimize contrast nephropathy - Continue to monitor volume status closely with history of chronic diastolic CHF  3. HTN: BP stable. Home lisinopril on hold.  - Continue current regimen  4. Iron deficiency anemia: Hgb 8.5 this morning. Iron panel c/f iron deficiency anemia. Primary team planning for IV iron this admission.  - Continue management per primary team  5. DM type 2: - Continue management per primary team  6. HLD: LDL 118 09/2017; goal <70 -  Continue statin   For questions or updates, please contact [place] Please consult [email or web address] for contact info under Cardiology/STEMI.      Signed, Abigail Butts, PA-C  07/13/2018, 9:28 AM   [phone]

## 2018-07-13 NOTE — Progress Notes (Signed)
PROGRESS NOTE    Gabrielle [name]  KYH:[id number] DOB: November 05, 1935 DOA: 07/11/2018 PCP: Forrest Moron, MD    Brief Narrative:  83 year old female who presented with chest pressure. She does have a significant past medical history for diastolic heart failure, renal cell carcinoma status post nephrectomy, chronic kidney disease and history of CVA. Reported 3 days history of nausea, vomiting, decreased p.o. intake and 24 hours of diarrhea. On the day of admission she developed chest pressure, moderate in intensity, lasting for about an hour with no radiation, associated with dyspnea. She is able to do her routine activities without angina. Had a stress test about a year ago, she has been without air conditioning for the last 3 days,she lost her husband about a year ago.Initial physical examination blood pressure 144/52, heart rate 68, respiratory rate 14, temperature 98.3, oxygen saturation 100%. Lungs clear to auscultation bilaterally, heart S1-S2 present and rhythmic, abdomen soft nontender, no lower extremity edema. Sodium 134, potassium 3.6, chloride 101, bicarb 20, glucose 106, BUN 58, creatinine 2.7, troponin 4.49,white count 7.8, hemoglobin 9.1, hematocrit 28.7, platelets 365.Troponin I4.49/4.36.EKG LVH and with inferior lateral ST depressions.  Patient was admitted to the hospital with working diagnosis of non-ST elevation myocardial infarction complicated with AKI on CKD stage IV   Assessment & Plan:   Principal Problem:   NSTEMI (non-ST elevated myocardial infarction) ([place]) Active Problems:   Type 2 diabetes mellitus with hypoglycemia without coma ([place])   Acute renal failure superimposed on chronic kidney disease ([place])   Anemia due to chronic kidney disease   Nausea, vomiting, and diarrhea   CKD (chronic kidney disease), stage IV ([place])  1.Non-ST elevation myocardial infarction (inferior lateral ST depression). Patient has remained chest pain free, follow up ekg  personally reviewed noted persistent st depressions on the inferior lateral leads. Blood pressure has remained stable, no arrhythmias on telemetry, preserved LV systolic function per echocardiography. Continue medical therapy with heparin IV, aspirin, statin and b blockade. No ace inh due to AKI. Will need coronary angiography on this admission.   2. Pre-renal AKI on CKD stage IV (sp nephrectomy).  Serum cr down to 1,8, at baseline, will continue normal saline at 100 ml per hour as prophylaxis for contrast induced nephropathy. K at 4,4 and serum cl at 112 with bicarb at 20. Urine output over last 24 hours 750 ml, patient clinically with improve volume status, continue to follow renal function and electrolytes.    3. HTN. Blood pressure has remained stable with systolic 151 to 761, no signs of volume overlaod.  4. T2DM with hypoglycemia. Patient tolerating po well but poor appetiite, capillary glucose trending down 106, 105, 126, 90, 84. Patient NPO for procedure this am, will add dextrose to IV fluids.   5. Iron deficiency anemia. Iron panel with transferrin saturation of 9, ferritin 1, serum iron 22, and TIBC 252, cw severe iron deficiency, will plan for IV iron on this admission. Hb at 8,5 and hct 27, no current indication for prbc transfusion. Will target hb above 8 to 9 in the setting of ACS.    DVT prophylaxis:IV heparin Code Status:full Family Communication:no family at the bedside Disposition Plan/ discharge barriers:Pending cardiac workup.   Consultants:  Cardiology  Procedures:    Antimicrobials:      Subjective: No chest pain or dyspnea, no nausea or vomiting, no further diarrhea, complains of being thirsty. Has been npo after midnight.   Objective: Vitals:   07/12/18 2036 07/13/18 0442 07/13/18 0500 07/13/18 6073  BP: (!) 128/43 (!) 152/55  (!) 138/44  Pulse: 66 68  61  Resp: 12 16  13   Temp: 98.4 F (36.9 C) 98.5 F (36.9 C)  99.3 F (37.4  C)  TempSrc: Oral Oral  Oral  SpO2: 100% 97%  100%  Weight:   57.6 kg (126 lb 14.4 oz)   Height:        Intake/Output Summary (Last 24 hours) at 07/13/2018 0756 Last data filed at 07/13/2018 0655 Gross per 24 hour  Intake 3352.55 ml  Output 750 ml  Net 2602.55 ml   Filed Weights   07/11/18 0135 07/12/18 0401 07/13/18 0500  Weight: 56 kg (123 lb 7.3 oz) 56.4 kg (124 lb 4.8 oz) 57.6 kg (126 lb 14.4 oz)    Examination:   General: Not in pain or dyspnea, deconditioned  Neurology: Awake and alert, non focal  E ENT: mild pallor, no icterus, oral mucosa moist Cardiovascular: No JVD. S1-S2 present, rhythmic, no gallops, rubs, or murmurs. No lower extremity edema. Pulmonary: positive breath sounds bilaterally, adequate air movement, no wheezing, rhonchi or rales. Gastrointestinal. Abdomen with no organomegaly, non tender, no rebound or guarding Skin. No rashes Musculoskeletal: no joint deformities     Data Reviewed: I have personally reviewed following labs and imaging studies  CBC: Recent Labs  Lab 07/11/18 0250 07/12/18 0349 07/13/18 0513  WBC 7.8 5.4 4.2  NEUTROABS 5.5 3.5  --   HGB 9.1* 8.9* 8.5*  HCT 28.7* 28.7* 27.0*  MCV 84.7 84.7 85.2  PLT 365 329 194   Basic Metabolic Panel: Recent Labs  Lab 07/11/18 0250 07/12/18 0349 07/13/18 0513  NA 134* 136 138  K 3.6 4.1 4.4  CL 101 107 112*  CO2 20* 21* 20*  GLUCOSE 106* 125* 87  BUN 58* 42* 31*  CREATININE 2.77* 2.26* 1.81*  CALCIUM 8.0* 7.8* 7.8*   GFR: Estimated Creatinine Clearance: 20.2 mL/min (A) (by C-G formula based on SCr of 1.81 mg/dL (H)). Liver Function Tests: Recent Labs  Lab 07/11/18 0250  AST 21  ALT 14  ALKPHOS 49  BILITOT 0.5  PROT 5.7*  ALBUMIN 3.0*   No results for input(s): LIPASE, AMYLASE in the last 168 hours. No results for input(s): AMMONIA in the last 168 hours. Coagulation Profile: No results for input(s): INR, PROTIME in the last 168 hours. Cardiac Enzymes: Recent Labs    Lab 07/11/18 0250 07/11/18 0543 07/11/18 0842  TROPONINI 4.49* 4.36* 3.99*   BNP (last 3 results) No results for input(s): PROBNP in the last 8760 hours. HbA1C: No results for input(s): HGBA1C in the last 72 hours. CBG: Recent Labs  Lab 07/11/18 2117 07/12/18 0753 07/12/18 1200 07/12/18 1701 07/12/18 2115  GLUCAP 168* 106* 105* 126* 90   Lipid Profile: No results for input(s): CHOL, HDL, LDLCALC, TRIG, CHOLHDL, LDLDIRECT in the last 72 hours. Thyroid Function Tests: No results for input(s): TSH, T4TOTAL, FREET4, T3FREE, THYROIDAB in the last 72 hours. Anemia Panel: Recent Labs    07/12/18 1131  FERRITIN 1*  TIBC 252  IRON 22*      Radiology Studies: I have reviewed all of the imaging during this hospital visit personally     Scheduled Meds: . aspirin EC  81 mg Oral Daily  . atorvastatin  40 mg Oral q1800  . carvedilol  3.125 mg Oral BID WC  . clopidogrel  75 mg Oral Daily  . insulin aspart  0-9 Units Subcutaneous TID WC  . sodium chloride flush  3 mL  Intravenous Q12H   Continuous Infusions: . sodium chloride 100 mL/hr at 07/12/18 1934  . sodium chloride    . sodium chloride 100 mL/hr (07/13/18 0218)  . heparin 750 Units/hr (07/12/18 1230)     LOS: 2 days        Joycelin Radloff Gerome Apley, MD [facility] Pager [phone]

## 2018-07-13 NOTE — Progress Notes (Signed)
[facility] ROUNDING NOTE   Subjective:   Interval History: has complaints of not sleeping well. This is not new and was not prominently more pronounced than at home. She denied pain and is in agreement with the cardiac cath today but would like to speak with.  Objective:  Vital signs in last 24 hours:  Temp:  [98.4 F (36.9 C)-98.7 F (37.1 C)] 98.5 F (36.9 C) (07/19 0442) Pulse Rate:  [62-68] 68 (07/19 0442) Resp:  [10-16] 16 (07/19 0442) BP: (115-152)/(42-58) 152/55 (07/19 0442) SpO2:  [97 %-100 %] 97 % (07/19 0442) Weight:  [126 lb 14.4 oz (57.6 kg)] 126 lb 14.4 oz (57.6 kg) (07/19 0500)  Weight change: 2 lb 9.6 oz (1.179 kg) Filed Weights   07/11/18 0135 07/12/18 0401 07/13/18 0500  Weight: 123 lb 7.3 oz (56 kg) 124 lb 4.8 oz (56.4 kg) 126 lb 14.4 oz (57.6 kg)    Intake/Output: I/O last 3 completed shifts: In: 5054.8 [P.O.:960; I.V.:4094.8] Out: 1050 [Urine:1050]   Intake/Output this shift:  No intake/output data recorded.  Physical Exam: General: A/O x4, in no acute distress, afebrile, nondiaphoretic  CVS- RRR, no murmurs rubs or gallops  RS- CTA bilaterally  ABD- BS present soft non-distended EXT- no edema bilaterally   Basic Metabolic Panel: Recent Labs  Lab 07/11/18 0250 07/12/18 0349 07/13/18 0513  NA 134* 136 138  K 3.6 4.1 4.4  CL 101 107 112*  CO2 20* 21* 20*  GLUCOSE 106* 125* 87  BUN 58* 42* 31*  CREATININE 2.77* 2.26* 1.81*  CALCIUM 8.0* 7.8* 7.8*    Liver Function Tests: Recent Labs  Lab 07/11/18 0250  AST 21  ALT 14  ALKPHOS 49  BILITOT 0.5  PROT 5.7*  ALBUMIN 3.0*   No results for input(s): LIPASE, AMYLASE in the last 168 hours. No results for input(s): AMMONIA in the last 168 hours.  CBC: Recent Labs  Lab 07/11/18 0250 07/12/18 0349 07/13/18 0513  WBC 7.8 5.4 4.2  NEUTROABS 5.5 3.5  --   HGB 9.1* 8.9* 8.5*  HCT 28.7* 28.7* 27.0*  MCV 84.7 84.7 85.2  PLT 365 329 277    Cardiac Enzymes: Recent Labs   Lab 07/11/18 0250 07/11/18 0543 07/11/18 0842  TROPONINI 4.49* 4.36* 3.99*    BNP: Invalid input(s): POCBNP  CBG: Recent Labs  Lab 07/11/18 2117 07/12/18 0753 07/12/18 1200 07/12/18 1701 07/12/18 2115  GLUCAP 168* 106* 105* 126* 90    Microbiology: Results for orders placed or performed during the hospital encounter of 07/11/18  MRSA PCR Screening     Status: None   Collection Time: 07/11/18  1:39 AM  Result Value Ref Range Status   MRSA by PCR NEGATIVE NEGATIVE Final    Comment:        The GeneXpert MRSA Assay (FDA approved for NASAL specimens only), is one component of a comprehensive MRSA colonization surveillance program. It is not intended to diagnose MRSA infection nor to guide or monitor treatment for MRSA infections. Performed at [facility], [address] [place], [place] [address]     Coagulation Studies: No results for input(s): LABPROT, INR in the last 72 hours.  Urinalysis: No results for input(s): COLORURINE, LABSPEC, PHURINE, GLUCOSEU, HGBUR, BILIRUBINUR, KETONESUR, PROTEINUR, UROBILINOGEN, NITRITE, LEUKOCYTESUR in the last 72 hours.  Invalid input(s): APPERANCEUR   Imaging: US Renal  Result Date: 07/11/2018 CLINICAL DATA:  Initial evaluation for acute renal injury. Status post right nephrectomy. EXAM: RENAL / URINARY TRACT ULTRASOUND COMPLETE  COMPARISON:  None. FINDINGS: Right Kidney: Surgically absent. Left Kidney: Length: 10.4 cm. Echogenicity within normal limits. No hydronephrosis. 1.6 x 1.5 x 1.4 cm cyst present at the upper pole. Additional 1.1 x 0.8 x 1.0 cyst at the upper pole. Bladder: Appears normal for degree of bladder distention. Left jet visualized. IMPRESSION: 1. Left kidney demonstrates no hydronephrosis or other acute abnormality. 2. Left renal cysts as above. 3. Prior right nephrectomy. Electronically Signed   By: Jeannine Boga M.D.   On: 07/11/2018 23:42   Medications:   . sodium chloride 100 mL/hr at  07/12/18 1934  . sodium chloride    . sodium chloride 100 mL/hr (07/13/18 0218)  . heparin 750 Units/hr (07/12/18 1230)   . aspirin EC  81 mg Oral Daily  . atorvastatin  40 mg Oral q1800  . carvedilol  3.125 mg Oral BID WC  . clopidogrel  75 mg Oral Daily  . insulin aspart  0-9 Units Subcutaneous TID WC  . sodium chloride flush  3 mL Intravenous Q12H   sodium chloride, acetaminophen, nitroGLYCERIN, ondansetron (ZOFRAN) IV, sodium chloride flush, zolpidem  Assessment/ Plan:  Assessment: Gabrielle [name] is a 83 y.o.femalew/ a PMHx notable for G1DD w/ an LVEF of 55%, RCC s/pnephrectomy, CKD III, and prior CVA with mild baseline cognitive decline who presented to the ED for generalized weakness. She endorsed general unwellness for several days prior to admission.  Plan: 1.  Prerenal acute renal injury on CKD stage IV:  We feel this is most likely secondary to hypotension/dehydration with concurrent angiotensin-converting enzyme inhibitor use. Cr down to 1.81.  -Agree with holding the patient's lisinopril -Agree with normal saline 75 mL's an hour -Recommend BMP's daily to monitor renal function --Would avoid nephrotoxic agents --Would permit renal function to return to baseline with adequate rehydration   2.  NSTEMI: Patient troponin elevated greater than 4.0, EKG with less than 1 mm of ST depression in inferiolateral leads.  Cardiology is planning for cardiac catheterization on 07/19 with staged PCI.  Echocardiogram with preserved LVEF noting grade 1 diastolic dysfunction. There is no evidence of focal hypokinesis.   3.  HTN: Patient's blood pressure stable 130/58.  We will continue to hold her lisinopril given her acute renal injury and monitor blood pressure has been giving her fluids.  4. Insulin independent DMII: As per primary team.    LOS: 2 Kathi Ludwig, MD Internal Medicine PGY-2

## 2018-07-13 NOTE — Interval H&P Note (Signed)
Cath Lab Visit (complete for each Cath Lab visit)  Clinical Evaluation Leading to the Procedure:   ACS: Yes.    Non-ACS:    Anginal Classification: CCS IV  Anti-ischemic medical therapy: Minimal Therapy (1 class of medications)  Non-Invasive Test Results: No non-invasive testing performed  Prior CABG: No previous CABG      History and Physical Interval Note:  07/13/2018 2:02 PM  Gabrielle [name]  has presented today for surgery, with the diagnosis of nstemi  The various methods of treatment have been discussed with the patient and family. After consideration of risks, benefits and other options for treatment, the patient has consented to  Procedure(s): LEFT HEART CATH AND CORONARY ANGIOGRAPHY (N/A) as a surgical intervention .  The patient's history has been reviewed, patient examined, no change in status, stable for surgery.  I have reviewed the patient's chart and labs.  Questions were answered to the patient's satisfaction.     Larae Grooms

## 2018-07-13 NOTE — Progress Notes (Signed)
Visited with Gabrielle [name] today and introduced myself to her.  She has experienced great loss of her husband and son both in 2018.  She has a daughter, 5 grandchildren and 5 great grandchildren that she looks forward to seeing.  She asked that we pray about her family that they be okay.  She says her health is so so but she is doing about all she can without her husband.  We prayed together about all she requested.  Grief support and compassionate presence was provided.      07/13/18 1218  Clinical Encounter Type  Visited With Patient  Visit Type Initial;Spiritual support;Pre-op  Spiritual Encounters  Spiritual Needs Prayer;Grief support

## 2018-07-13 NOTE — Progress Notes (Addendum)
ANTICOAGULATION CONSULT NOTE - Follow Up Consult  Pharmacy Consult for Heparin Indication: chest pain/ACS, multivessel CAD  Allergies  Allergen Reactions  . Amlodipine Itching  . Codeine Nausea And Vomiting  . Coreg [Carvedilol] Nausea And Vomiting  . Fentanyl Other (See Comments)    unknown  . Lacosamide Other (See Comments)     Other name is, VIMPAT unknown  . Levetiracetam Other (See Comments)    Strange feelings in head  . Metformin And Related Other (See Comments)    unknown  . Metoprolol Other (See Comments)    unknown  . Morphine And Related Itching  . Oxybutynin Swelling    mouth  . Sertraline Nausea Only and Other (See Comments)    Swelling in mouth  . Tessalon [Benzonatate] Other (See Comments)    unknown  . Other Rash    BETA BLOCKER    Patient Measurements: Height: 5\' 1"  (154.9 cm) Weight: 126 lb 14.4 oz (57.6 kg) IBW/kg (Calculated) : 47.8 Heparin Dosing Weight: 57 kg  Vital Signs: Temp: 98.7 F (37.1 C) (07/19 1619) Temp Source: Oral (07/19 1619) BP: 134/41 (07/19 1712) Pulse Rate: 58 (07/19 1712)  Labs: Recent Labs    07/11/18 0250 07/11/18 0543 07/11/18 0842  07/12/18 0349 07/12/18 1017 07/12/18 2021 07/13/18 0513  HGB 9.1*  --   --   --  8.9*  --   --  8.5*  HCT 28.7*  --   --   --  28.7*  --   --  27.0*  PLT 365  --   --   --  329  --   --  277  HEPARINUNFRC  --  0.60  --    < >  --  0.29* 0.46 0.47  CREATININE 2.77*  --   --   --  2.26*  --   --  1.81*  TROPONINI 4.49* 4.36* 3.99*  --   --   --   --   --    < > = values in this interval not displayed.    Estimated Creatinine Clearance: 20.2 mL/min (A) (by C-G formula based on SCr of 1.81 mg/dL (H)).  Assessment:  83 year old female who transferred from [place] with elevated troponin on IV Heparin infusion. Pharmacy was consulted to continue Heparin dosing for ACS. Pt not on anticoagulation PTA.     S/p cardiac cath today, multivessel calcific CAD. TCTS consulted.   Heparin to resume 8 hrs after sheath out.  Radial sheath out ~2:30pm.  No bleeding or hematoma noted.    Last heparin level was therapeutic (0.47) on 750 units/hr.  Goal of Therapy:  Heparin level 0.3-0.7 units/ml Monitor platelets by anticoagulation protocol: Yes   Plan:   Heparin drip to resume ~2230 at 750 units/hr  Next heparin level and CBC ~7am, ~8 hrs after heparin resumed  Follow up TCTS consult and plans.  Arty Baumgartner, [place] Pager: [phone] 07/13/2018,6:00 PM

## 2018-07-14 ENCOUNTER — Inpatient Hospital Stay (HOSPITAL_COMMUNITY): Payer: Medicare Other

## 2018-07-14 DIAGNOSIS — I2510 Atherosclerotic heart disease of native coronary artery without angina pectoris: Secondary | ICD-10-CM

## 2018-07-14 LAB — CBC
HCT: 26.2 % — ABNORMAL LOW (ref 36.0–46.0)
Hemoglobin: 8.3 g/dL — ABNORMAL LOW (ref 12.0–15.0)
MCH: 26.7 pg (ref 26.0–34.0)
MCHC: 31.7 g/dL (ref 30.0–36.0)
MCV: 84.2 fL (ref 78.0–100.0)
Platelets: 294 10*3/uL (ref 150–400)
RBC: 3.11 MIL/uL — ABNORMAL LOW (ref 3.87–5.11)
RDW: 16 % — ABNORMAL HIGH (ref 11.5–15.5)
WBC: 7.7 10*3/uL (ref 4.0–10.5)

## 2018-07-14 LAB — BASIC METABOLIC PANEL
Anion gap: 5 (ref 5–15)
BUN: 24 mg/dL — ABNORMAL HIGH (ref 8–23)
CO2: 20 mmol/L — ABNORMAL LOW (ref 22–32)
Calcium: 8 mg/dL — ABNORMAL LOW (ref 8.9–10.3)
Chloride: 113 mmol/L — ABNORMAL HIGH (ref 98–111)
Creatinine, Ser: 1.62 mg/dL — ABNORMAL HIGH (ref 0.44–1.00)
GFR calc Af Amer: 33 mL/min — ABNORMAL LOW (ref >60)
GFR calc non Af Amer: 29 mL/min — ABNORMAL LOW (ref >60)
Glucose, Bld: 96 mg/dL (ref 70–99)
Potassium: 4.3 mmol/L (ref 3.5–5.1)
Sodium: 138 mmol/L (ref 135–145)

## 2018-07-14 LAB — GLUCOSE, CAPILLARY
Glucose-Capillary: 81 mg/dL (ref 70–99)
Glucose-Capillary: 116 mg/dL — ABNORMAL HIGH (ref 70–99)
Glucose-Capillary: 128 mg/dL — ABNORMAL HIGH (ref 70–99)
Glucose-Capillary: 149 mg/dL — ABNORMAL HIGH (ref 70–99)

## 2018-07-14 LAB — PROTIME-INR
INR: 1.11
Prothrombin Time: 14.3 seconds (ref 11.4–15.2)

## 2018-07-14 LAB — HEPARIN LEVEL (UNFRACTIONATED): Heparin Unfractionated: 0.3 IU/mL (ref 0.30–0.70)

## 2018-07-14 LAB — TSH: TSH: 2.332 u[IU]/mL (ref 0.350–4.500)

## 2018-07-14 LAB — HEMOGLOBIN A1C
Hgb A1c MFr Bld: 5.6 % (ref 4.8–5.6)
Mean Plasma Glucose: 114.02 mg/dL

## 2018-07-14 LAB — SURGICAL PCR SCREEN
MRSA, PCR: NEGATIVE
Staphylococcus aureus: NEGATIVE

## 2018-07-14 LAB — PLATELET INHIBITION P2Y12: Platelet Function  P2Y12: 80 [PRU] — ABNORMAL LOW (ref 194–418)

## 2018-07-14 MED ORDER — SODIUM CHLORIDE 0.9 % IV SOLN
510.0000 mg | Freq: Once | INTRAVENOUS | Status: AC
  Administered 2018-07-14: 510 mg via INTRAVENOUS
  Filled 2018-07-14: qty 17

## 2018-07-14 NOTE — Progress Notes (Signed)
Subjective:  Tolerated catheterization well previously.  Seen by Dr. Nils Pyle who feels patient is not a good candidate for surgery. No current complaints of chest pain or shortness of breath.  Renal function has improved today and close to baseline.  Objective:  Vital Signs in the last 24 hours: BP (!) 133/48 (BP Location: Left Arm)   Pulse 60   Temp 99 F (37.2 C) (Oral)   Resp 14   Ht 5\' 1"  (1.549 m)   Wt 57.2 kg (126 lb)   SpO2 98%   BMI 23.81 kg/m   Physical Exam: Elderly female in no acute distress Lungs:  Clear Cardiac:  Regular rhythm, normal S1 and S2, no S3 Abdomen:  Soft, nontender, no masses Extremities:  No edema present  Intake/Output from previous day: 07/19 0701 - 07/20 0700 In: 1985.1 [P.O.:775; I.V.:1210.1] Out: 700 [Urine:700]  Weight Filed Weights   07/12/18 0401 07/13/18 0500 07/14/18 0519  Weight: 56.4 kg (124 lb 4.8 oz) 57.6 kg (126 lb 14.4 oz) 57.2 kg (126 lb)    Lab Results: Basic Metabolic Panel: Recent Labs    07/13/18 0513 07/14/18 0444  NA 138 138  K 4.4 4.3  CL 112* 113*  CO2 20* 20*  GLUCOSE 87 96  BUN 31* 24*  CREATININE 1.81* 1.62*   CBC: Recent Labs    07/12/18 0349 07/13/18 0513 07/14/18 0444  WBC 5.4 4.2 7.7  NEUTROABS 3.5  --   --   HGB 8.9* 8.5* 8.3*  HCT 28.7* 27.0* 26.2*  MCV 84.7 85.2 84.2  PLT 329 277 294   Telemetry: Sinus rhythm personally reviewed  Assessment/Plan:  1.  Non-STEMI with severe 2 vessel diseasenot good surgical candidate 2.  Acute on chronic kidney disease with creatinine stable today following catheterization yesterday with minimal dye load 3.  Hypertensive heart disease 4.  Solitary kidney  Recommendations:  Continue hydration over the weekend and may consider PCI on Monday if creatinine stable tomorrow.      Kerry Hough  MD [facility]  07/14/2018, 12:39 PM

## 2018-07-14 NOTE — Progress Notes (Signed)
PROGRESS NOTE    Gabrielle [name]  TWS:[id number] DOB: 1935/11/05 DOA: 07/11/2018 PCP: Forrest Moron, MD    Brief Narrative:  83 year old female who presented with chest pressure. She does have a significant past medical history for diastolic heart failure, renal cell carcinoma status post nephrectomy, chronic kidney disease and history of CVA.Reported3 days history of nausea, vomiting, decreased p.o. intake and 24 hours of diarrhea. On the day of admission she developed chest pressure, moderate in intensity, lasting for about an hour with no radiation, associated with dyspnea. She is able to do her routine activities without angina. Had a stress test about a year ago, she has beenwithoutair conditioning for the last 3 days,she lost her husband about a year ago.Initial physical examination blood pressure 144/52, heart rate 68, respiratory rate14, temperature 98.3, oxygen saturation 100%. Lungs clear to auscultation bilaterally, heart S1-S2 present and rhythmic, abdomen soft nontender, no lower extremity edema. Sodium 134, potassium 3.6, chloride 101, bicarb 20, glucose 106, BUN 58, creatinine 2.7, troponin 4.49,white count 7.8, hemoglobin 9.1, hematocrit 28.7, platelets 365.Troponin I4.49/4.36.[facility] andwith inferior lateral ST depressions.  Patient was admitted to the hospital with working diagnosis of non-ST elevation myocardial infarctioncomplicated with AKI on CKD stage IV   Assessment & Plan:   Principal Problem:   NSTEMI (non-ST elevated myocardial infarction) ([place]) Active Problems:   Type 2 diabetes mellitus with hypoglycemia without coma ([place])   Acute renal failure superimposed on chronic kidney disease ([place])   Anemia due to chronic kidney disease   Nausea, vomiting, and diarrhea   CKD (chronic kidney disease), stage IV ([place])   AKI (acute kidney injury) ([place])   1.Non-ST elevation myocardial infarction (inferior lateral ST depression). Coronary  angiography with multivessel disease, consulted CT surgery for bypass surgery. No chest pain or dyspnea, continue current medical therapy for ACS including heparin, aspirin/ clopidogrel, coreg, and statin, holding on ace inh due to AKI.    2. Pre-renal AKI on CKD stage IV (sp nephrectomy). Improved renal function with serum cr down to 1.62 with K at 4,3 and serum bicarbonate at 20 with chloride at 113. Will decrease rate of IV fluids to 50 ml per hour to avoid volume overload, follow renal panel in am, avoid hypotension or nephrotoxic medications.   3. HTN.Tolerating well carvedilol and amlodipine with blood pressure 001 systolic and HR 60.   4. V4BS with hypoglycemia.Will continue IV dextrose to avoid hypoglycemia, capillary glucose 107, 150, 81, 116. Holding on insulin for now. Tolerating po well, but poor appetite.    5. Iron deficiency anemia. (transferrin saturation of 9, ferritin 1, serum iron 22, and TIBC 252). Will order one dose of IV iron today, follow iron panel as outpatient. Target Hb above 8 to 9 in the setting of critical coronary artery disease.   DVT prophylaxis:IV heparin Code Status:full Family Communication:no family at the bedside Disposition Plan/ discharge barriers:Pending CT surgery evaluation.    Consultants:  Cardiology  Procedures:    Antimicrobials:     Subjective: Patient is feeling well this, no further chest pain or dyspnea, no nausea or vomiting.   Objective: Vitals:   07/14/18 0509 07/14/18 0519 07/14/18 0831 07/14/18 0832  BP: (!) 151/53  (!) 157/61   Pulse: 67     Resp: 14     Temp: 98.5 F (36.9 C)   99.2 F (37.3 C)  TempSrc: Oral   Oral  SpO2: 98%   99%  Weight:  57.2 kg (126 lb)    Height:  Intake/Output Summary (Last 24 hours) at 07/14/2018 0843 Last data filed at 07/13/2018 2130 Gross per 24 hour  Intake 1985.11 ml  Output 700 ml  Net 1285.11 ml   Filed Weights   07/12/18 0401 07/13/18 0500  07/14/18 0519  Weight: 56.4 kg (124 lb 4.8 oz) 57.6 kg (126 lb 14.4 oz) 57.2 kg (126 lb)    Examination:   General: Not in pain or dyspnea, mild deconditioned  Neurology: Awake and alert, non focal  E ENT: mild pallor, no icterus, oral mucosa moist Cardiovascular: No JVD. S1-S2 present, rhythmic, no gallops, rubs, or murmurs. No lower extremity edema. Pulmonary: vesicular breath sounds bilaterally, adequate air movement, no wheezing, rhonchi or rales. Gastrointestinal. Abdomen with no organomegaly, non tender, no rebound or guarding Skin. No rashes Musculoskeletal: no joint deformities     Data Reviewed: I have personally reviewed following labs and imaging studies  CBC: Recent Labs  Lab 07/11/18 0250 07/12/18 0349 07/13/18 0513 07/14/18 0444  WBC 7.8 5.4 4.2 7.7  NEUTROABS 5.5 3.5  --   --   HGB 9.1* 8.9* 8.5* 8.3*  HCT 28.7* 28.7* 27.0* 26.2*  MCV 84.7 84.7 85.2 84.2  PLT 365 329 277 017   Basic Metabolic Panel: Recent Labs  Lab 07/11/18 0250 07/12/18 0349 07/13/18 0513 07/14/18 0444  NA 134* 136 138 138  K 3.6 4.1 4.4 4.3  CL 101 107 112* 113*  CO2 20* 21* 20* 20*  GLUCOSE 106* 125* 87 96  BUN 58* 42* 31* 24*  CREATININE 2.77* 2.26* 1.81* 1.62*  CALCIUM 8.0* 7.8* 7.8* 8.0*   GFR: Estimated Creatinine Clearance: 20.9 mL/min (A) (by C-G formula based on SCr of 1.62 mg/dL (H)). Liver Function Tests: Recent Labs  Lab 07/11/18 0250  AST 21  ALT 14  ALKPHOS 49  BILITOT 0.5  PROT 5.7*  ALBUMIN 3.0*   No results for input(s): LIPASE, AMYLASE in the last 168 hours. No results for input(s): AMMONIA in the last 168 hours. Coagulation Profile: Recent Labs  Lab 07/14/18 0444  INR 1.11   Cardiac Enzymes: Recent Labs  Lab 07/11/18 0250 07/11/18 0543 07/11/18 0842  TROPONINI 4.49* 4.36* 3.99*   BNP (last 3 results) No results for input(s): PROBNP in the last 8760 hours. HbA1C: Recent Labs    07/14/18 0444  HGBA1C 5.6   CBG: Recent Labs  Lab  07/12/18 2115 07/13/18 0754 07/13/18 1055 07/13/18 1617 07/13/18 2125  GLUCAP 90 84 90 107* 150*   Lipid Profile: No results for input(s): CHOL, HDL, LDLCALC, TRIG, CHOLHDL, LDLDIRECT in the last 72 hours. Thyroid Function Tests: Recent Labs    07/14/18 0444  TSH 2.332   Anemia Panel: Recent Labs    07/12/18 1131  FERRITIN 1*  TIBC 252  IRON 22*      Radiology Studies: I have reviewed all of the imaging during this hospital visit personally     Scheduled Meds: . amLODipine  5 mg Oral Daily  . aspirin EC  81 mg Oral Daily  . atorvastatin  40 mg Oral q1800  . carvedilol  3.125 mg Oral BID WC  . clopidogrel  75 mg Oral Daily  . sodium chloride flush  3 mL Intravenous Q12H   Continuous Infusions: . sodium chloride    . dextrose 5 % and 0.9% NaCl 100 mL/hr at 07/13/18 1338  . heparin 750 Units/hr (07/13/18 2317)     LOS: 3 days        Mauricio Gerome Apley, MD [facility]  Pager [phone]

## 2018-07-14 NOTE — Progress Notes (Signed)
ANTICOAGULATION CONSULT NOTE - Follow Up Consult  Pharmacy Consult for Heparin Indication: chest pain/ACS, multivessel CAD  Allergies  Allergen Reactions  . Amlodipine Itching    Med resumed 05/28/18  . Codeine Nausea And Vomiting  . Coreg [Carvedilol] Nausea And Vomiting  . Fentanyl Other (See Comments)    unknown  . Lacosamide Other (See Comments)     Other name is, VIMPAT unknown  . Levetiracetam Other (See Comments)    Strange feelings in head  . Metformin And Related Other (See Comments)    unknown  . Metoprolol Other (See Comments)    unknown  . Morphine And Related Itching  . Oxybutynin Swelling    mouth  . Sertraline Nausea Only and Other (See Comments)    Swelling in mouth  . Tessalon [Benzonatate] Other (See Comments)    unknown  . Other Rash    BETA BLOCKER    Patient Measurements: Height: 5\' 1"  (154.9 cm) Weight: 126 lb (57.2 kg) IBW/kg (Calculated) : 47.8 Heparin Dosing Weight: 57 kg  Vital Signs: Temp: 99.2 F (37.3 C) (07/20 0832) Temp Source: Oral (07/20 0832) BP: 157/61 (07/20 0831) Pulse Rate: 67 (07/20 0509)  Labs: Recent Labs    07/12/18 0349  07/12/18 2021 07/13/18 0513 07/14/18 0444  HGB 8.9*  --   --  8.5* 8.3*  HCT 28.7*  --   --  27.0* 26.2*  PLT 329  --   --  277 294  LABPROT  --   --   --   --  14.3  INR  --   --   --   --  1.11  HEPARINUNFRC  --    < > 0.46 0.47 0.30  CREATININE 2.26*  --   --  1.81* 1.62*   < > = values in this interval not displayed.    Estimated Creatinine Clearance: 20.9 mL/min (A) (by C-G formula based on SCr of 1.62 mg/dL (H)).  Assessment: 83 year old female who transferred from [place] with elevated troponin on IV Heparin infusion. Pharmacy was consulted to continue Heparin dosing for ACS. Pt not on anticoagulation PTA. Patient is s/p cardiac cath 7/19, multivessel calcific CAD. TCTS consulted: Pt is a poor candidate for surgery,LAD and RCA appear adequate for PCI which would be best option  for this patient.  Heparin gtt resumed 8 hrs after sheath out.    Heparin level drawn on 7/20 6-hours post-resuming heparin. HL at the lower end of therapeutic.  CBC stable, no s/sx of bleeding noted.  Goal of Therapy:  Heparin level 0.3-0.7 units/ml Monitor platelets by anticoagulation protocol: Yes   Plan:  Will slightly increase heparin to 800 units/hr Monitor daily heparin level, CBC, s/sx of bleeding  Janae Bridgeman, PharmD PGY1 Pharmacy Resident Phone: [phone] 07/14/2018 10:31 AM

## 2018-07-14 NOTE — Consult Note (Addendum)
[address]       [place] [address]             [phone]        Kawena C Kinyon Argo Medical Record #[id number] Date of Birth: 10-26-1935  Referring: No ref. provider found Primary Care: Forrest Moron, MD Primary Cardiologist:Jonathan Gwenlyn Found, MD  Chief Complaint- chest tightness  83 year old frail History of Present Illness:     Patient examined, coronary angiogram 2 D echocardiogram images personally reviewed and discussed with patient.  83 year old admitted with chest discomfort, EKG changes, and positive cardiac enzymes.  She has history of vascular disease status post renal stent by Dr. Gwenlyn Found.  She has had a previous nephrectomy for renal cell carcinoma.  Hx left carotid endarterectomy. The ascending aorta isheavily  calcified. History of chronic anemia.  Patient is on chronic Plavix  Baseline creat is 1.8, post cath creatinine 2.8 She has history of dementia, previous stroke, and fairly low functional status lives with her daughter.  Decreased exercise tolerance from spinal stenosis-arthritis  Cardiac catheterization LAD stenosis demonstrated high-grade proximal and high-grade RCA stenosis.  LVAD 20.  Echocardiogram shows LV systolic function.   Current Activity/ Functional Status: Functional status poor    Zubrod Score: At the time of surgery this patient's most appropriate activity status/level should be described as: []     0    Normal activity, no symptoms []     1    Restricted in physical strenuous activity but ambulatory, able to do out light work []     2    Ambulatory and capable of self care, unable to do work activities, up and about                 more than 50%  Of the time                            [x]     3    Only limited self care, in bed greater than 50% of waking hours []     4    Completely disabled, no self care, confined to bed or chair []     5    Moribund  Past Medical History:  Diagnosis Date  . Anemia    hx  .  Anxiety   . Arthritis   . Azotemia   . Carotid artery disease ([facility])   . Claudication ([facility])   . CVA (cerebral vascular accident) ([facility]) 2008   LOC DIZZINESS ; "mini stroke" (07/12/2018)  . Depression   . Essential hypertension, benign   . History of gout   . History of kidney stones   . Hyperlipidemia   . Migraine    "years ago; due to vision issues; got glasses & they went away" (07/12/2018)  . Neck pain   . NSTEMI (non-ST elevated myocardial infarction) ([facility]) 07/11/2018  . PAD (peripheral artery disease) ([facility])    hx. LCEA, hx Lt renal art. stenting, occl rt renal artery, occluded bil SFAs, moderatie iliac disease,    . Renal cell cancer ([facility]) 2001   "left"  . Seizure ([facility])    pt does not recall this hx on 07/12/2018  . Type II diabetes mellitus ([facility]) 1990    Past Surgical History:  Procedure Laterality Date  . ABDOMINAL HYSTERECTOMY    . BACK SURGERY    . CAROTID ENDARTERECTOMY Left   . CATARACT EXTRACTION W/ INTRAOCULAR LENS  IMPLANT, BILATERAL Bilateral   . INCISION AND DRAINAGE ABSCESS Right 10/26/2017   Procedure: INCISION AND DRAINAGE HEMATOMA RIGHT SHIN;  Surgeon: Georganna Skeans, MD;  Location: [facility];  Service: General;  Laterality: Right;  . LUMBAR DISC SURGERY    . NEPHRECTOMY Right   . NM MYOCAR PERF WALL MOTION  07/12/2011   normal  . PV angiogram  2004   Lt renal artery stent  . TONSILLECTOMY    . URETERAL STENT PLACEMENT Left     Social History   Tobacco Use  Smoking Status Former Smoker  . Packs/day: 1.00  . Years: 50.00  . Pack years: 50.00  . Types: Cigarettes  . Last attempt to quit: 12/26/2005  . Years since quitting: 12.5  Smokeless Tobacco Never Used    Social History   Substance and Sexual Activity  Alcohol Use Never  . Frequency: Never     Allergies  Allergen Reactions  . Amlodipine Itching    Med resumed 05/28/18  . Codeine Nausea And Vomiting  . Coreg [Carvedilol] Nausea And Vomiting  . Fentanyl Other (See Comments)    unknown  .  Lacosamide Other (See Comments)     Other name is, VIMPAT unknown  . Levetiracetam Other (See Comments)    Strange feelings in head  . Metformin And Related Other (See Comments)    unknown  . Metoprolol Other (See Comments)    unknown  . Morphine And Related Itching  . Oxybutynin Swelling    mouth  . Sertraline Nausea Only and Other (See Comments)    Swelling in mouth  . Tessalon [Benzonatate] Other (See Comments)    unknown  . Other Rash    BETA BLOCKER    Current Facility-Administered Medications  Medication Dose Route Frequency Provider Last Rate Last Dose  . 0.9 %  sodium chloride infusion  250 mL Intravenous PRN Jettie Booze, MD      . acetaminophen (TYLENOL) tablet 650 mg  650 mg Oral Q4H PRN Jettie Booze, MD      . amLODipine (NORVASC) tablet 5 mg  5 mg Oral Daily Jettie Booze, MD   5 mg at 07/14/18 0831  . aspirin EC tablet 81 mg  81 mg Oral Daily Jettie Booze, MD   81 mg at 07/14/18 0831  . atorvastatin (LIPITOR) tablet 40 mg  40 mg Oral q1800 Jettie Booze, MD   40 mg at 07/13/18 1718  . carvedilol (COREG) tablet 3.125 mg  3.125 mg Oral BID WC Jettie Booze, MD   3.125 mg at 07/14/18 0831  . clopidogrel (PLAVIX) tablet 75 mg  75 mg Oral Daily Jettie Booze, MD   75 mg at 07/13/18 1038  . dextrose 5 %-0.9 % sodium chloride infusion   Intravenous Continuous Arrien, Jimmy Picket, MD 100 mL/hr at 07/13/18 1338    . ferumoxytol (FERAHEME) 510 mg in sodium chloride 0.9 % 100 mL IVPB  510 mg Intravenous Once Arrien, Jimmy Picket, MD      . heparin ADULT infusion 100 units/mL (25000 units/292mL sodium chloride 0.45%)  750 Units/hr Intravenous Continuous Larae Grooms S, MD 7.5 mL/hr at 07/13/18 2317 750 Units/hr at 07/13/18 2317  . nitroGLYCERIN (NITROSTAT) SL tablet 0.4 mg  0.4 mg Sublingual Q5 min PRN Jettie Booze, MD      . ondansetron [facility]) injection 4 mg  4 mg Intravenous Q6H PRN Jettie Booze,  MD      . sodium chloride flush (NS)  0.9 % injection 3 mL  3 mL Intravenous Q12H Jettie Booze, MD   3 mL at 07/14/18 0831  . sodium chloride flush (NS) 0.9 % injection 3 mL  3 mL Intravenous PRN Jettie Booze, MD      . zolpidem [facility]) tablet 5 mg  5 mg Oral QHS PRN,MR X 1 Jettie Booze, MD   5 mg at 07/13/18 2155    Medications Prior to Admission  Medication Sig Dispense Refill Last Dose  . acetaminophen (TYLENOL) 500 MG tablet Take 1,000 mg by mouth every 6 (six) hours as needed.   07/09/2018 at prn  . amLODipine (NORVASC) 5 MG tablet Take 5 mg by mouth daily.  3 07/10/2018 at Unknown time  . aspirin 81 MG tablet Take 81 mg by mouth daily.   07/10/2018 at Unknown time  . clopidogrel (PLAVIX) 75 MG tablet Take 1 tablet (75 mg total) by mouth daily. 90 tablet 3 07/10/2018 at Unknown time  . ferrous sulfate (SLOW FE) 160 (50 Fe) MG TBCR SR tablet Take 1 tablet (160 mg total) by mouth daily. 30 each 3 Past Week at Unknown time  . glipiZIDE (GLUCOTROL) 5 MG tablet TAKE 1/2 TABLET BY MOUTH DAILY BEFORE BREAKFAST 15 tablet 0 07/10/2018 at Unknown time  . lisinopril (PRINIVIL,ZESTRIL) 40 MG tablet TAKE 1 TABLET BY MOUTH DAILY 90 tablet 0 07/10/2018 at Unknown time  . zolpidem (AMBIEN CR) 12.5 MG CR tablet Take 1 tablet (12.5 mg total) by mouth at bedtime as needed for sleep. 30 tablet 4 07/09/2018 at prn    Family History  Problem Relation Age of Onset  . Cancer Mother   . Heart disease Father      Review of Systems:   ROS      Cardiac Review of Systems: Y or  [    ]= no  Chest Pain Blue.Reese    ]  Resting SOB [   ] Exertional SOB  Blue.Reese  ]  Pontianus.Latina [  ]   Pedal Edema Blue.Reese   ]    Palpitations [  ] Syncope  [  ]   Presyncope [   ]  General Review of Systems: [Y] = yes [  ]=no Constitional: recent weight change [  ]; anorexia [  ]; fatigue [ y ]; nausea [  ]; night sweats [  ]; fever [  ]; or chills [  ]                                                               Dental: Last  Dentist visit:1 yr, poor dental hygiene   Eye : blurred vision [  ]; diplopia [   ]; vision changes [  ];  Amaurosis fugax[  ]; Resp: cough [  ];  wheezing[  ];  hemoptysis[  ]; shortness of breath[y  ]; paroxysmal nocturnal dyspnea[  ]; dyspnea on exertion[  ]; or orthopnea[  ];  GI:  gallstones[  ], vomiting[  ];  dysphagia[  ]; melena[  ];  hematochezia [  ]; heartburn[ y ];   Hx of  Colonoscopy[  ]; GU: kidney stones [  ]; hematuria[  ];   dysuria [  ];  nocturia[  ];  history of  obstruction [  ]; urinary frequency [  ]             Skin: rash, swelling[  ];, hair loss[  ];  peripheral edema[  ];  or itching[  ]; Musculosketetal: myalgias[  ];  joint swelling[  ];  joint erythema[  ];  joint pain[y  ];  back pain[y  ];  Heme/Lymph: bruising[ y ];  bleeding[  ];  anemia[  ];  Neuro: TIA[  ];  headaches[  ];  stroke[  ];  vertigo[  ];  seizures[  ];   paresthesias[  ];  difficulty walking[y  ];  Psych:depression[  ]; anxiety[  ];  Endocrine: diabetes[  ];  thyroid dysfunction[  ];                  Physical Exam: BP (!) 157/61   Pulse 67   Temp 99.2 F (37.3 C) (Oral)   Resp 14   Ht 5\' 1"  (1.549 m)   Wt 126 lb (57.2 kg)   SpO2 99%   BMI 23.81 kg/m         Exam    General- alert and comfortable, confused about her situation    neck- no JVD, no cervical adenopathy palpable, no carotid bruit   Lungs- clear without rales, wheezes   Cor- regular rate and rhythm, no murmur , gallop.  Nonpalpable pedal pulse   Abdomen- soft, non-tender   Extremities - warm, non-tender, minimal edema   Neuro- oriented, appropriate, no focal weakness But  Diagnostic Studies & Laboratory data:     Recent Radiology Findings:   No results found.   I have independently reviewed the above radiologic studies and discussed with the patient   Recent Lab Findings: Lab Results  Component Value Date   WBC 7.7 07/14/2018   HGB 8.3 (L) 07/14/2018   HCT 26.2 (L) 07/14/2018   PLT 294 07/14/2018    GLUCOSE 96 07/14/2018   CHOL 186 09/25/2017   TRIG 124 09/25/2017   HDL 43 09/25/2017   LDLCALC 118 (H) 09/25/2017   ALT 14 07/11/2018   AST 21 07/11/2018   NA 138 07/14/2018   K 4.3 07/14/2018   CL 113 (H) 07/14/2018   CREATININE 1.62 (H) 07/14/2018   BUN 24 (H) 07/14/2018   CO2 20 (L) 07/14/2018   TSH 2.332 07/14/2018   INR 1.11 07/14/2018   HGBA1C 5.6 07/14/2018      Assessment / Plan:   Severe two-vessel coronary artery disease in a fragile 83 year old female with multiple significant medical comorbidities including acute on chronic renal insufficiency,prior nephrectomy, peripheral vessel disease, chronic Plavix, poor mobility from spinal arthritis, previous stroke and dementia.    She is a poor candidate for surgery with likelihood of postop renal failure, HD and death.She should not have CABG. LAD and RCA appear adequate for PCI which would be best option for this patient  Ivin Poot, MD          @ME1 @ 07/14/2018 9:24 AM

## 2018-07-15 DIAGNOSIS — I2510 Atherosclerotic heart disease of native coronary artery without angina pectoris: Secondary | ICD-10-CM

## 2018-07-15 LAB — BASIC METABOLIC PANEL
Anion gap: 7 (ref 5–15)
BUN: 16 mg/dL (ref 8–23)
CO2: 21 mmol/L — ABNORMAL LOW (ref 22–32)
Calcium: 8.3 mg/dL — ABNORMAL LOW (ref 8.9–10.3)
Chloride: 112 mmol/L — ABNORMAL HIGH (ref 98–111)
Creatinine, Ser: 1.61 mg/dL — ABNORMAL HIGH (ref 0.44–1.00)
GFR calc Af Amer: 34 mL/min — ABNORMAL LOW (ref >60)
GFR calc non Af Amer: 29 mL/min — ABNORMAL LOW (ref >60)
Glucose, Bld: 100 mg/dL — ABNORMAL HIGH (ref 70–99)
Potassium: 4.3 mmol/L (ref 3.5–5.1)
Sodium: 140 mmol/L (ref 135–145)

## 2018-07-15 LAB — CBC
HCT: 25.5 % — ABNORMAL LOW (ref 36.0–46.0)
Hemoglobin: 8.1 g/dL — ABNORMAL LOW (ref 12.0–15.0)
MCH: 27.1 pg (ref 26.0–34.0)
MCHC: 31.8 g/dL (ref 30.0–36.0)
MCV: 85.3 fL (ref 78.0–100.0)
Platelets: 280 10*3/uL (ref 150–400)
RBC: 2.99 MIL/uL — ABNORMAL LOW (ref 3.87–5.11)
RDW: 15.9 % — ABNORMAL HIGH (ref 11.5–15.5)
WBC: 5.8 10*3/uL (ref 4.0–10.5)

## 2018-07-15 LAB — GLUCOSE, CAPILLARY
Glucose-Capillary: 97 mg/dL (ref 70–99)
Glucose-Capillary: 111 mg/dL — ABNORMAL HIGH (ref 70–99)
Glucose-Capillary: 150 mg/dL — ABNORMAL HIGH (ref 70–99)
Glucose-Capillary: 165 mg/dL — ABNORMAL HIGH (ref 70–99)

## 2018-07-15 LAB — HEPARIN LEVEL (UNFRACTIONATED): Heparin Unfractionated: 0.5 IU/mL (ref 0.30–0.70)

## 2018-07-15 MED ORDER — ASPIRIN 81 MG PO CHEW
81.0000 mg | CHEWABLE_TABLET | ORAL | Status: AC
  Administered 2018-07-16: 81 mg via ORAL
  Filled 2018-07-15: qty 1

## 2018-07-15 MED ORDER — SODIUM CHLORIDE 0.9 % IV SOLN: 250.0000 mL | INTRAVENOUS | Status: DC | PRN

## 2018-07-15 MED ORDER — SODIUM CHLORIDE 0.9% FLUSH: 3.0000 mL | INTRAVENOUS | Status: DC | PRN

## 2018-07-15 MED ORDER — SODIUM CHLORIDE 0.9 % IV SOLN: INTRAVENOUS | Status: DC

## 2018-07-15 MED ORDER — SODIUM CHLORIDE 0.9% FLUSH
3.0000 mL | Freq: Two times a day (BID) | INTRAVENOUS | Status: DC
  Administered 2018-07-15: 3 mL via INTRAVENOUS

## 2018-07-15 NOTE — Progress Notes (Signed)
Subjective:  No chest discomfort overnight.  Her creatinine is stable today and at baseline.  Objective:  Vital Signs in the last 24 hours: BP (!) 152/48 (BP Location: Left Arm)   Pulse 62   Temp 98.9 F (37.2 C) (Oral)   Resp 17   Ht 5\' 1"  (1.549 m)   Wt 57.7 kg (127 lb 1.6 oz)   SpO2 99%   BMI 24.02 kg/m   Physical Exam: Elderly female in no acute distress Lungs:  Clear Cardiac:  Regular rhythm, normal S1 and S2, no S3 Extremities:  No edema present  Intake/Output from previous day: 07/20 0701 - 07/21 0700 In: 609.8 [P.O.:240; I.V.:252.8; IV Piggyback:117] Out: 600 [Urine:600]  Weight Filed Weights   07/13/18 0500 07/14/18 0519 07/15/18 0452  Weight: 57.6 kg (126 lb 14.4 oz) 57.2 kg (126 lb) 57.7 kg (127 lb 1.6 oz)    Lab Results: Basic Metabolic Panel: Recent Labs    07/14/18 0444 07/15/18 0411  NA 138 140  K 4.3 4.3  CL 113* 112*  CO2 20* 21*  GLUCOSE 96 100*  BUN 24* 16  CREATININE 1.62* 1.61*   CBC: Recent Labs    07/14/18 0444 07/15/18 0411  WBC 7.7 5.8  HGB 8.3* 8.1*  HCT 26.2* 25.5*  MCV 84.2 85.3  PLT 294 280   Telemetry: Sinus rhythm personally reviewed  Assessment/Plan:  1.  Non-STEMI with severe 2 vessel disease not good surgical candidate 2.  Acute on chronic kidney disease with creatinine stable 2 days following catheterization  3.  Hypertensive heart disease 4.  Solitary kidney  Recommendations:  Discussed PCI with patient including risk of death, MI, worsening renal insufficiency.  She asks whether both vessels will be done and I told her it dependent on the dye load given.  We will hydrate today.      Kerry Hough  MD [facility] Cardiology  07/15/2018, 10:51 AM

## 2018-07-15 NOTE — Progress Notes (Addendum)
Progress Note  Patient Name: Gabrielle [name] Date of Encounter: 07/15/2018  Primary Cardiologist: Quay Burow, MD   Subjective   No chest pain and no SOB, eating BK   Inpatient Medications    Scheduled Meds: . amLODipine  5 mg Oral Daily  . aspirin EC  81 mg Oral Daily  . atorvastatin  40 mg Oral q1800  . carvedilol  3.125 mg Oral BID WC  . clopidogrel  75 mg Oral Daily  . sodium chloride flush  3 mL Intravenous Q12H   Continuous Infusions: . sodium chloride    . dextrose 5 % and 0.9% NaCl 50 mL/hr at 07/15/18 0517  . heparin 800 Units/hr (07/15/18 0516)   PRN Meds: sodium chloride, acetaminophen, nitroGLYCERIN, ondansetron (ZOFRAN) IV, sodium chloride flush, zolpidem   Vital Signs    Vitals:   07/14/18 2017 07/15/18 0001 07/15/18 0452 07/15/18 0819  BP: (!) 137/47 (!) 141/52 (!) 142/49 (!) 152/48  Pulse: (!) 58 72 61 62  Resp: 13 13 17    Temp: 98.7 F (37.1 C) 98.7 F (37.1 C) 98.8 F (37.1 C) 98.9 F (37.2 C)  TempSrc: Oral Oral Oral Oral  SpO2: 98% 98% 97% 99%  Weight:   127 lb 1.6 oz (57.7 kg)   Height:        Intake/Output Summary (Last 24 hours) at 07/15/2018 0831 Last data filed at 07/15/2018 0600 Gross per 24 hour  Intake 609.79 ml  Output 600 ml  Net 9.79 ml   Filed Weights   07/13/18 0500 07/14/18 0519 07/15/18 0452  Weight: 126 lb 14.4 oz (57.6 kg) 126 lb (57.2 kg) 127 lb 1.6 oz (57.7 kg)    Telemetry    short burst SVT on 07/13/18 otherwise SR and last night a 2.8 sec pause,   - Personally Reviewed  ECG    No new - Personally Reviewed  Physical Exam   GEN: No acute distress.   Neck: No JVD Cardiac: RRR, soft systolic murmur, no rubs, or gallops.  Respiratory: Clear to auscultation bilaterally. GI: Soft, nontender, non-distended  MS: No edema; No deformity. Neuro:  Nonfocal  Psych: Normal affect   Labs    Chemistry Recent Labs  Lab 07/11/18 0250  07/13/18 0513 07/14/18 0444 07/15/18 0411  NA 134*   < > 138 138 140    K 3.6   < > 4.4 4.3 4.3  CL 101   < > 112* 113* 112*  CO2 20*   < > 20* 20* 21*  GLUCOSE 106*   < > 87 96 100*  BUN 58*   < > 31* 24* 16  CREATININE 2.77*   < > 1.81* 1.62* 1.61*  CALCIUM 8.0*   < > 7.8* 8.0* 8.3*  PROT 5.7*  --   --   --   --   ALBUMIN 3.0*  --   --   --   --   AST 21  --   --   --   --   ALT 14  --   --   --   --   ALKPHOS 49  --   --   --   --   BILITOT 0.5  --   --   --   --   GFRNONAA 15*   < > 25* 29* 29*  GFRAA 18*   < > 29* 33* 34*  ANIONGAP 13   < > 6 5 7    < > = values in this interval  not displayed.     Hematology Recent Labs  Lab 07/13/18 0513 07/14/18 0444 07/15/18 0411  WBC 4.2 7.7 5.8  RBC 3.17* 3.11* 2.99*  HGB 8.5* 8.3* 8.1*  HCT 27.0* 26.2* 25.5*  MCV 85.2 84.2 85.3  MCH 26.8 26.7 27.1  MCHC 31.5 31.7 31.8  RDW 15.8* 16.0* 15.9*  PLT 277 294 280    Cardiac Enzymes Recent Labs  Lab 07/11/18 0250 07/11/18 0543 07/11/18 0842  TROPONINI 4.49* 4.36* 3.99*   No results for input(s): TROPIPOC in the last 168 hours.   BNPNo results for input(s): BNP, PROBNP in the last 168 hours.   DDimer No results for input(s): DDIMER in the last 168 hours.   Radiology    Dg Chest 2 View  Result Date: 07/14/2018 CLINICAL DATA:  Chest pain and shortness of breath. EXAM: CHEST - 2 VIEW COMPARISON:  07/10/2017 FINDINGS: The cardiac silhouette, mediastinal and hilar contours are within normal limits and stable. There is tortuosity and dense calcification of the thoracic aorta. The lungs are clear. No pleural effusions. The bony thorax is intact. IMPRESSION: No acute cardiopulmonary findings. Electronically Signed   By: Marijo Sanes M.D.   On: 07/14/2018 09:23    Cardiac Studies   Echocardiogram 07/11/18: Study Conclusions  - Left ventricle: The cavity size was normal. There was moderate concentric hypertrophy. Systolic function was normal. The estimated ejection fraction was in the range of 55% to 60%. Wall motion was normal; there were  no regional wall motion abnormalities. Doppler parameters are consistent with abnormal left ventricular relaxation (grade 1 diastolic dysfunction). Doppler parameters are consistent with high ventricular filling pressure. - Aortic valve: Transvalvular velocity was within the normal range. There was no stenosis. There was trivial regurgitation. - Mitral valve: Calcified annulus. Transvalvular velocity was within the normal range. There was no evidence for stenosis. There was mild regurgitation. - Right ventricle: The cavity size was normal. Wall thickness was normal. Systolic function was normal. - Atrial septum: No defect or patent foramen ovale was identified by color flow Doppler. - Tricuspid valve: There was trivial regurgitation. - Pulmonary arteries: Systolic pressure was within the normal range. PA peak pressure: 29 mm Hg (S).  Cardiac cath 07/13/18   Dist LM lesion is 25% stenosed.  Ost LAD lesion is 50% stenosed.  Prox LAD lesion is 75% stenosed.  Ost 1st Diag lesion is 99% stenosed.  Prox RCA lesion is 99% stenosed.  There is no aortic valve stenosis.  LV end diastolic pressure is mildly elevated.  Tortuosity in the right subclavian makes torquing catheter for the RCA somewhat difficult.   COntinue aspirin.  Get CVTS consult for severe multivessel calcific CAD.   High dose lipid lowering therapy as well.   Resume heparin after 8 hours.    IV hydration.    Contrast minimized.  Diagnostic Diagram         Patient Profile     83 y.o. female with PMH ofPVOD s/pleftcarotid endaterectomy, renal artery stentingfor RAS, bilateral SFA disease, as well as HTN, HLD, DM, h/o CVA, CKD, RCC s/p nephrectomy and chronic diastolic HF, who is being followed by cardiology for NSTEMI and had cath 07/13/18.      Assessment & Plan    NSTEMI with  Troponin pk 4.49, EKG with new inferolateral TWI.  Cardiac cath as above.     Acute on chronic  CKD  Cr today 1.61 stable after minimal dye load.  Continue hydration.   Will to defer to MD  to proceed with PCI for Monday.    Solitary Kidney chronic  HTN  BP 152/43        For questions or updates, please contact [place] Please consult [email or web address] for contact info under Cardiology/STEMI.      Signed, Cecilie Kicks, NP  07/15/2018, 8:31 AM     Patient seen and examined. Agree with assessment and plan.  No recurrent chest pain.  Patient was evaluated by Dr. Prescott Gum for surgical consultation.  He was felt to be too high risk for surgery due to her cardiovascular comorbidities, solitary kidney, and renal insufficiency.  Creatinine was 2.77 on July 17 and this has improved to 1.62 today.  We will continue with hydration with normal saline at 50 cc/h.  We will tentatively scheduled for possible prevention tomorrow with Dr. Irish Lack  With renal insufficiency PCI of her calcified LAD and RCA may need to be staged to reduce contrast burden.   Troy Sine, MD, [facility] 07/15/2018 10:35 AM

## 2018-07-15 NOTE — Progress Notes (Signed)
PROGRESS NOTE    Gabrielle [name]  OYD:[id number] DOB: 12-15-1935 DOA: 07/11/2018 PCP: Gabrielle Moron, MD    Brief Narrative:  83 year old female who presented with chest pressure. She does have a significant past medical history for diastolic heart failure, renal cell carcinoma status post nephrectomy, chronic kidney disease and history of CVA.Reported3 days history of nausea, vomiting, decreased p.o. intake and 24 hours of diarrhea. On the day of admission she developed chest pressure, moderate in intensity, lasting for about an hour with no radiation, associated with dyspnea. She is able to do her routine activities without angina. Had a stress test about a year ago, she has beenwithoutair conditioning for the last 3 days,she lost her husband about a year ago.Initial physical examination blood pressure 144/52, heart rate 68, respiratory rate14, temperature 98.3, oxygen saturation 100%. Lungs clear to auscultation bilaterally, heart S1-S2 present and rhythmic, abdomen soft nontender, no lower extremity edema. Sodium 134, potassium 3.6, chloride 101, bicarb 20, glucose 106, BUN 58, creatinine 2.7, troponin 4.49,white count 7.8, hemoglobin 9.1, hematocrit 28.7, platelets 365.Troponin I4.49/4.36.Gabrielle [name] andwith inferior lateral ST depressions.  Patient was admitted to the hospital with working diagnosis of non-ST elevation myocardial infarctioncomplicated with AKI on CKD stage IV  Assessment & Plan:   Principal Problem:   NSTEMI (non-ST elevated myocardial infarction) (Gabrielle [name]) Active Problems:   Type 2 diabetes mellitus with hypoglycemia without coma (Gabrielle [name])   Acute renal failure superimposed on chronic kidney disease (Gabrielle [name])   Anemia due to chronic kidney disease   Nausea, vomiting, and diarrhea   CKD (chronic kidney disease), stage IV (Gabrielle [name])   AKI (acute kidney injury) (Gabrielle [name])   1.Non-ST elevation myocardial infarction (inferior lateral ST depression)/ severe two vessel  disease (LAD and RCA). Patient not candidate for bypass grafting, will continue medical therapy with asa/ clopidogrel, atorvastatin and coreg. Patient has remained chest pain free. Plan for PCI in am.   2. Pre-renal AKI on CKD stage IV (sp nephrectomy).Renal function with serum cr down to 1.61 with K at 4,3 and serum bicarbonate at 21 with chloride at 112. Will continue saline hydration at 75 ml per hour for contrast induced nephropathy prophylaxis. Follow renal panel in am. Documented urine output 600 cc over last 24 hours with net positive fluid balance +5,872.6 ml.   3. HTN.Continue carvedilol and amlodipine for blood pressure control.  4. T2DM with hypoglycemia.Capillary glucose 116, 128, 149, 97, 111. Will continue IV dextrose to prevent hypoglycemia, patient will be NPO after midnight for PCI.  5. Iron deficiency anemia. (transferrin saturation of 9, ferritin 1, serum iron 22, and TIBC 252). Sp IV Iron. Hb and hct continue to be stable, follow iron panel as outpatient.   DVT prophylaxis:IV heparin Code Status:full Family Communication:no family at the bedside Disposition Plan/ discharge barriers:Pending PCI 07/22.    Consultants:  Cardiology  Procedures:     Subjective: Patient is feeling well, no chest pain or dyspnea, no nausea or vomiting.   Objective: Vitals:   07/14/18 2017 07/15/18 0001 07/15/18 0452 07/15/18 0819  BP: (!) 137/47 (!) 141/52 (!) 142/49 (!) 152/48  Pulse: (!) 58 72 61 62  Resp: 13 13 17    Temp: 98.7 F (37.1 C) 98.7 F (37.1 C) 98.8 F (37.1 C) 98.9 F (37.2 C)  TempSrc: Oral Oral Oral Oral  SpO2: 98% 98% 97% 99%  Weight:   57.7 kg (127 lb 1.6 oz)   Height:        Intake/Output Summary (Last 24 hours) at 07/15/2018 6720  Last data filed at 07/15/2018 0600 Gross per 24 hour  Intake 609.79 ml  Output 600 ml  Net 9.79 ml   Filed Weights   07/13/18 0500 07/14/18 0519 07/15/18 0452  Weight: 57.6 kg (126 lb 14.4 oz) 57.2  kg (126 lb) 57.7 kg (127 lb 1.6 oz)    Examination:   General: Not in pain or dyspnea, deconditioned  Neurology: Awake and alert, non focal  Gabrielle [name]:WYOV pallor, no icterus, oral mucosa moist Cardiovascular: No JVD. S1-S2 present, rhythmic, no gallops, rubs, or murmurs. No lower extremity edema. Pulmonary: positive breath sounds bilaterally, adequate air movement, no wheezing, rhonchi or rales. Gastrointestinal. Abdomen with no organomegaly, non tender, no rebound or guarding Skin. No rashes Musculoskeletal: no joint deformities     Data Reviewed: I have personally reviewed following labs and imaging studies  CBC: Recent Labs  Lab 07/11/18 0250 07/12/18 0349 07/13/18 0513 07/14/18 0444 07/15/18 0411  WBC 7.8 5.4 4.2 7.7 5.8  NEUTROABS 5.5 3.5  --   --   --   HGB 9.1* 8.9* 8.5* 8.3* 8.1*  HCT 28.7* 28.7* 27.0* 26.2* 25.5*  MCV 84.7 84.7 85.2 84.2 85.3  PLT 365 329 277 294 785   Basic Metabolic Panel: Recent Labs  Lab 07/11/18 0250 07/12/18 0349 07/13/18 0513 07/14/18 0444 07/15/18 0411  NA 134* 136 138 138 140  K 3.6 4.1 4.4 4.3 4.3  CL 101 107 112* 113* 112*  CO2 20* 21* 20* 20* 21*  GLUCOSE 106* 125* 87 96 100*  BUN 58* 42* 31* 24* 16  CREATININE 2.77* 2.26* 1.81* 1.62* 1.61*  CALCIUM 8.0* 7.8* 7.8* 8.0* 8.3*   GFR: Estimated Creatinine Clearance: 22.8 mL/min (A) (by C-G formula based on SCr of 1.61 mg/dL (H)). Liver Function Tests: Recent Labs  Lab 07/11/18 0250  AST 21  ALT 14  ALKPHOS 49  BILITOT 0.5  PROT 5.7*  ALBUMIN 3.0*   No results for input(s): LIPASE, AMYLASE in the last 168 hours. No results for input(s): AMMONIA in the last 168 hours. Coagulation Profile: Recent Labs  Lab 07/14/18 0444  INR 1.11   Cardiac Enzymes: Recent Labs  Lab 07/11/18 0250 07/11/18 0543 07/11/18 0842  TROPONINI 4.49* 4.36* 3.99*   BNP (last 3 results) No results for input(s): PROBNP in the last 8760 hours. HbA1C: Recent Labs    07/14/18 0444    HGBA1C 5.6   CBG: Recent Labs  Lab 07/14/18 0731 07/14/18 1116 07/14/18 1614 07/14/18 2111 07/15/18 0724  GLUCAP 81 116* 128* 149* 97   Lipid Profile: No results for input(s): CHOL, HDL, LDLCALC, TRIG, CHOLHDL, LDLDIRECT in the last 72 hours. Thyroid Function Tests: Recent Labs    07/14/18 0444  TSH 2.332   Anemia Panel: Recent Labs    07/12/18 1131  FERRITIN 1*  TIBC 252  IRON 22*      Radiology Studies: I have reviewed all of the imaging during this hospital visit personally     Scheduled Meds: . amLODipine  5 mg Oral Daily  . aspirin EC  81 mg Oral Daily  . atorvastatin  40 mg Oral q1800  . carvedilol  3.125 mg Oral BID WC  . clopidogrel  75 mg Oral Daily  . sodium chloride flush  3 mL Intravenous Q12H   Continuous Infusions: . sodium chloride    . dextrose 5 % and 0.9% NaCl 50 mL/hr at 07/15/18 0517  . heparin 800 Units/hr (07/15/18 0516)     LOS: 4 days  Gabrielle [name] Gerome Apley, MD [facility] Pager [phone]

## 2018-07-15 NOTE — Progress Notes (Signed)
ANTICOAGULATION CONSULT NOTE - Follow Up Consult  Pharmacy Consult for Heparin Indication: chest pain/ACS, multivessel CAD  Allergies  Allergen Reactions  . Amlodipine Itching    Med resumed 05/28/18  . Codeine Nausea And Vomiting  . Coreg [Carvedilol] Nausea And Vomiting  . Fentanyl Other (See Comments)    unknown  . Lacosamide Other (See Comments)     Other name is, VIMPAT unknown  . Levetiracetam Other (See Comments)    Strange feelings in head  . Metformin And Related Other (See Comments)    unknown  . Metoprolol Other (See Comments)    unknown  . Morphine And Related Itching  . Oxybutynin Swelling    mouth  . Sertraline Nausea Only and Other (See Comments)    Swelling in mouth  . Tessalon [Benzonatate] Other (See Comments)    unknown  . Other Rash    BETA BLOCKER    Patient Measurements: Height: 5\' 1"  (154.9 cm) Weight: 127 lb 1.6 oz (57.7 kg) IBW/kg (Calculated) : 47.8 Heparin Dosing Weight: 57 kg  Vital Signs: Temp: 98.9 F (37.2 C) (07/21 0819) Temp Source: Oral (07/21 0819) BP: 152/48 (07/21 0819) Pulse Rate: 62 (07/21 0819)  Labs: Recent Labs    07/13/18 0513 07/14/18 0444 07/15/18 0411  HGB 8.5* 8.3* 8.1*  HCT 27.0* 26.2* 25.5*  PLT 277 294 280  LABPROT  --  14.3  --   INR  --  1.11  --   HEPARINUNFRC 0.47 0.30 0.50  CREATININE 1.81* 1.62* 1.61*    Estimated Creatinine Clearance: 22.8 mL/min (A) (by C-G formula based on SCr of 1.61 mg/dL (H)).  Assessment: 83 year old female who transferred from [place] with elevated troponin on IV Heparin infusion. Pharmacy was consulted to continue Heparin dosing for ACS. Pt not on anticoagulation PTA. Patient is s/p cardiac cath 7/19, multivessel calcific CAD. TCTS consulted: Pt is a poor candidate for surgery. Possible PCI Monday  Heparin level today therapeutic.  CBC stable, no s/sx of bleeding noted.  Goal of Therapy:  Heparin level 0.3-0.7 units/ml Monitor platelets by anticoagulation  protocol: Yes   Plan:  Continue heparin infusion at 800 units/hr Monitor daily heparin level, CBC, s/sx of bleeding  Thank you for involving pharmacy in this patient's care.  Janae Bridgeman, PharmD PGY1 Pharmacy Resident Phone: [phone] 07/15/2018 10:57 AM

## 2018-07-16 ENCOUNTER — Encounter (HOSPITAL_COMMUNITY): Payer: Self-pay | Admitting: Interventional Cardiology

## 2018-07-16 ENCOUNTER — Inpatient Hospital Stay (HOSPITAL_COMMUNITY)
Admission: AD | Disposition: A | Discharge status: Skilled Nursing Facility | Payer: Self-pay | Source: Other Acute Inpatient Hospital | Attending: Internal Medicine

## 2018-07-16 DIAGNOSIS — I25110 Atherosclerotic heart disease of native coronary artery with unstable angina pectoris: Secondary | ICD-10-CM

## 2018-07-16 DIAGNOSIS — N170 Acute kidney failure with tubular necrosis: Secondary | ICD-10-CM

## 2018-07-16 DIAGNOSIS — N181 Chronic kidney disease, stage 1: Secondary | ICD-10-CM

## 2018-07-16 HISTORY — PX: LEFT HEART CATH AND CORONARY ANGIOGRAPHY: CATH118249

## 2018-07-16 HISTORY — PX: CORONARY ATHERECTOMY: CATH118238

## 2018-07-16 HISTORY — PX: TEMPORARY PACEMAKER: CATH118268

## 2018-07-16 HISTORY — PX: CORONARY STENT INTERVENTION: CATH118234

## 2018-07-16 LAB — ABO/RH: ABO/RH(D): B POS

## 2018-07-16 LAB — BASIC METABOLIC PANEL
Anion gap: 9 (ref 5–15)
BUN: 13 mg/dL (ref 8–23)
CO2: 20 mmol/L — ABNORMAL LOW (ref 22–32)
Calcium: 8.3 mg/dL — ABNORMAL LOW (ref 8.9–10.3)
Chloride: 112 mmol/L — ABNORMAL HIGH (ref 98–111)
Creatinine, Ser: 1.54 mg/dL — ABNORMAL HIGH (ref 0.44–1.00)
GFR calc Af Amer: 36 mL/min — ABNORMAL LOW (ref >60)
GFR calc non Af Amer: 31 mL/min — ABNORMAL LOW (ref >60)
Glucose, Bld: 95 mg/dL (ref 70–99)
Potassium: 4.2 mmol/L (ref 3.5–5.1)
Sodium: 141 mmol/L (ref 135–145)

## 2018-07-16 LAB — POCT ACTIVATED CLOTTING TIME
Activated Clotting Time: 252 seconds
Activated Clotting Time: 257 seconds
Activated Clotting Time: 323 seconds
Activated Clotting Time: 307 seconds
Activated Clotting Time: 180 seconds
Activated Clotting Time: 169 seconds

## 2018-07-16 LAB — CBC
HCT: 27.1 % — ABNORMAL LOW (ref 36.0–46.0)
Hemoglobin: 8.4 g/dL — ABNORMAL LOW (ref 12.0–15.0)
MCH: 26.8 pg (ref 26.0–34.0)
MCHC: 31 g/dL (ref 30.0–36.0)
MCV: 86.3 fL (ref 78.0–100.0)
Platelets: 257 10*3/uL (ref 150–400)
RBC: 3.14 MIL/uL — ABNORMAL LOW (ref 3.87–5.11)
RDW: 16 % — ABNORMAL HIGH (ref 11.5–15.5)
WBC: 6.1 10*3/uL (ref 4.0–10.5)

## 2018-07-16 LAB — TYPE AND SCREEN
ABO/RH(D): B POS
Antibody Screen: NEGATIVE

## 2018-07-16 LAB — GLUCOSE, CAPILLARY
Glucose-Capillary: 102 mg/dL — ABNORMAL HIGH (ref 70–99)
Glucose-Capillary: 93 mg/dL (ref 70–99)
Glucose-Capillary: 91 mg/dL (ref 70–99)
Glucose-Capillary: 87 mg/dL (ref 70–99)

## 2018-07-16 LAB — HEPARIN LEVEL (UNFRACTIONATED): Heparin Unfractionated: 0.56 IU/mL (ref 0.30–0.70)

## 2018-07-16 SURGERY — CORONARY STENT INTERVENTION
Anesthesia: LOCAL

## 2018-07-16 MED ORDER — HEPARIN (PORCINE) IN NACL 1000-0.9 UT/500ML-% IV SOLN
INTRAVENOUS | Status: AC
  Filled 2018-07-16: qty 1000

## 2018-07-16 MED ORDER — LIDOCAINE HCL (PF) 1 % IJ SOLN
INTRAMUSCULAR | Status: AC
  Filled 2018-07-16: qty 30

## 2018-07-16 MED ORDER — ONDANSETRON HCL 4 MG/2ML IJ SOLN
4.0000 mg | Freq: Four times a day (QID) | INTRAMUSCULAR | Status: DC | PRN
  Filled 2018-07-16: qty 2

## 2018-07-16 MED ORDER — CLOPIDOGREL BISULFATE 75 MG PO TABS: 75.0000 mg | ORAL_TABLET | Freq: Every day | ORAL | Status: DC

## 2018-07-16 MED ORDER — SODIUM CHLORIDE 0.9 % WEIGHT BASED INFUSION: 1.0000 mL/kg/h | INTRAVENOUS | Status: DC

## 2018-07-16 MED ORDER — HEPARIN SODIUM (PORCINE) 1000 UNIT/ML IJ SOLN
INTRAMUSCULAR | Status: AC
  Filled 2018-07-16: qty 1

## 2018-07-16 MED ORDER — SODIUM CHLORIDE 0.9 % WEIGHT BASED INFUSION
3.0000 mL/kg/h | INTRAVENOUS | Status: DC
  Administered 2018-07-16: 3 mL/kg/h via INTRAVENOUS

## 2018-07-16 MED ORDER — VIPERSLIDE LUBRICANT OPTIME
TOPICAL | Status: DC | PRN
  Administered 2018-07-16: 13:00:00 via SURGICAL_CAVITY

## 2018-07-16 MED ORDER — SODIUM CHLORIDE 0.9 % IV SOLN
INTRAVENOUS | Status: AC
  Administered 2018-07-16: 17:00:00 via INTRAVENOUS

## 2018-07-16 MED ORDER — LABETALOL HCL 5 MG/ML IV SOLN: 10.0000 mg | INTRAVENOUS | Status: AC | PRN

## 2018-07-16 MED ORDER — ACETAMINOPHEN 325 MG PO TABS
650.0000 mg | ORAL_TABLET | ORAL | Status: DC | PRN
  Administered 2018-07-16 – 2018-07-17 (×3): 650 mg via ORAL
  Filled 2018-07-16 (×5): qty 2

## 2018-07-16 MED ORDER — IOPAMIDOL (ISOVUE-370) INJECTION 76%
INTRAVENOUS | Status: DC | PRN
  Administered 2018-07-16: 85 mL via INTRA_ARTERIAL

## 2018-07-16 MED ORDER — SODIUM CHLORIDE 0.9 % IV SOLN: 250.0000 mL | INTRAVENOUS | Status: DC | PRN

## 2018-07-16 MED ORDER — ASPIRIN 81 MG PO CHEW: 81.0000 mg | CHEWABLE_TABLET | Freq: Every day | ORAL | Status: DC

## 2018-07-16 MED ORDER — MIDAZOLAM HCL 2 MG/2ML IJ SOLN
INTRAMUSCULAR | Status: AC
  Filled 2018-07-16: qty 2

## 2018-07-16 MED ORDER — HEPARIN (PORCINE) IN NACL 1000-0.9 UT/500ML-% IV SOLN
INTRAVENOUS | Status: DC | PRN
  Administered 2018-07-16 (×2): 500 mL

## 2018-07-16 MED ORDER — HYDRALAZINE HCL 20 MG/ML IJ SOLN: 5.0000 mg | INTRAMUSCULAR | Status: AC | PRN

## 2018-07-16 MED ORDER — ANGIOPLASTY BOOK
Freq: Once | Status: AC
  Administered 2018-07-16
  Filled 2018-07-16: qty 1

## 2018-07-16 MED ORDER — HEART ATTACK BOUNCING BOOK
Freq: Once | Status: AC
  Administered 2018-07-16
  Filled 2018-07-16: qty 1

## 2018-07-16 MED ORDER — CLOPIDOGREL BISULFATE 300 MG PO TABS
ORAL_TABLET | ORAL | Status: AC
  Filled 2018-07-16: qty 1

## 2018-07-16 MED ORDER — HEPARIN SODIUM (PORCINE) 1000 UNIT/ML IJ SOLN
INTRAMUSCULAR | Status: DC | PRN
  Administered 2018-07-16: 4000 [IU] via INTRAVENOUS
  Administered 2018-07-16: 3000 [IU] via INTRAVENOUS
  Administered 2018-07-16: 6000 [IU] via INTRAVENOUS

## 2018-07-16 MED ORDER — MIDAZOLAM HCL 2 MG/2ML IJ SOLN
INTRAMUSCULAR | Status: DC | PRN
  Administered 2018-07-16 (×3): 1 mg via INTRAVENOUS

## 2018-07-16 MED ORDER — SODIUM CHLORIDE 0.9% FLUSH: 3.0000 mL | INTRAVENOUS | Status: DC | PRN

## 2018-07-16 MED ORDER — SODIUM CHLORIDE 0.9% FLUSH
3.0000 mL | Freq: Two times a day (BID) | INTRAVENOUS | Status: DC
  Administered 2018-07-16 – 2018-07-19 (×3): 3 mL via INTRAVENOUS

## 2018-07-16 MED ORDER — CLOPIDOGREL BISULFATE 300 MG PO TABS
ORAL_TABLET | ORAL | Status: DC | PRN
  Administered 2018-07-16: 600 mg via ORAL

## 2018-07-16 MED ORDER — NITROGLYCERIN 1 MG/10 ML FOR IR/CATH LAB
INTRA_ARTERIAL | Status: AC
  Filled 2018-07-16: qty 10

## 2018-07-16 MED ORDER — MIDAZOLAM HCL 2 MG/2ML IJ SOLN
INTRAMUSCULAR | Status: AC
Start: 2018-07-16 — End: ?
  Filled 2018-07-16: qty 2

## 2018-07-16 MED ORDER — IOPAMIDOL (ISOVUE-370) INJECTION 76%
INTRAVENOUS | Status: AC
  Filled 2018-07-16: qty 125

## 2018-07-16 MED ORDER — LIDOCAINE HCL (PF) 1 % IJ SOLN
INTRAMUSCULAR | Status: DC | PRN
  Administered 2018-07-16: 20 mL
  Administered 2018-07-16: 10 mL

## 2018-07-16 SURGICAL SUPPLY — 30 items
BALLN MINITREK OTW 1.5X6 (BALLOONS) ×2
BALLN SAPPHIRE 2.5X15 (BALLOONS) ×2
BALLN SAPPHIRE 3.0X15 (BALLOONS) ×2
BALLN SAPPHIRE ~~LOC~~ 3.0X12 (BALLOONS) ×2 IMPLANT
BALLN SAPPHIRE ~~LOC~~ 3.25X12 (BALLOONS) ×2 IMPLANT
BALLOON MINITREK OTW 1.5X6 (BALLOONS) ×1 IMPLANT
BALLOON SAPPHIRE 2.5X15 (BALLOONS) ×1 IMPLANT
BALLOON SAPPHIRE 3.0X15 (BALLOONS) ×1 IMPLANT
CABLE ADAPT CONN TEMP 6FT (ADAPTER) ×2 IMPLANT
CATH LAUNCHER 6FR AL.75 (CATHETERS) ×2 IMPLANT
CATH LAUNCHER 6FR EBU3.5 (CATHETERS) ×2 IMPLANT
CATH LAUNCHER 6FR JR4 (CATHETERS) ×2 IMPLANT
CATH S G BIP PACING (SET/KITS/TRAYS/PACK) ×2 IMPLANT
CROWN DIAMONDBACK CLASSIC 1.25 (BURR) ×2 IMPLANT
ELECT DEFIB PAD ADLT CADENCE (PAD) ×2 IMPLANT
KIT ENCORE 26 ADVANTAGE (KITS) ×2 IMPLANT
KIT HEART LEFT (KITS) ×2 IMPLANT
KIT HEMO VALVE WATCHDOG (MISCELLANEOUS) ×2 IMPLANT
LUBRICANT VIPERSLIDE CORONARY (MISCELLANEOUS) ×2 IMPLANT
PACK CARDIAC CATHETERIZATION (CUSTOM PROCEDURE TRAY) ×2 IMPLANT
SHEATH PINNACLE 6F 10CM (SHEATH) ×4 IMPLANT
SHEATH PROBE COVER 6X72 (BAG) ×2 IMPLANT
SHIELD RADPAD SCOOP 12X17 (MISCELLANEOUS) ×2 IMPLANT
STENT SIERRA 2.75 X 18 MM (Permanent Stent) ×2 IMPLANT
STENT SIERRA 3.00 X 18 MM (Permanent Stent) ×2 IMPLANT
TRANSDUCER W/STOPCOCK (MISCELLANEOUS) ×2 IMPLANT
TUBING CIL FLEX 10 FLL-RA (TUBING) ×2 IMPLANT
WIRE ASAHI PROWATER 180CM (WIRE) ×2 IMPLANT
WIRE EMERALD 3MM-J .035X150CM (WIRE) ×2 IMPLANT
WIRE VIPER ADVANCE COR .012TIP (WIRE) ×2 IMPLANT

## 2018-07-16 NOTE — Interval H&P Note (Signed)
Cath Lab Visit (complete for each Cath Lab visit)  Clinical Evaluation Leading to the Procedure:   ACS: Yes.    Non-ACS:    Anginal Classification: CCS IV  Anti-ischemic medical therapy: Minimal Therapy (1 class of medications)  Non-Invasive Test Results: No non-invasive testing performed  Prior CABG: No previous CABG   Plan for possible orbital atherectomy of the RCA.  Will treat LAD first as this appears to be giving some collaterals to the right system.  If we can fix the left with minimal dye, will consider PCI of the RCA today as well.    History and Physical Interval Note:  07/16/2018 11:40 AM  Gabrielle [name]  has presented today for surgery, with the diagnosis of CAD  The various methods of treatment have been discussed with the patient and family. After consideration of risks, benefits and other options for treatment, the patient has consented to  Procedure(s): CORONARY STENT INTERVENTION (N/A) as a surgical intervention .  The patient's history has been reviewed, patient examined, no change in status, stable for surgery.  I have reviewed the patient's chart and labs.  Questions were answered to the patient's satisfaction.     Larae Grooms

## 2018-07-16 NOTE — Progress Notes (Signed)
PROGRESS NOTE    Gabrielle [name]  DEY:[id number] DOB: [birth year] DOA: 07/11/2018 PCP: Forrest Moron, MD    Brief Narrative:  83 year old female who presented with chest pressure. She does have a significant past medical history for diastolic heart failure, renal cell carcinoma status post nephrectomy, chronic kidney disease and history of CVA.Reported3 days history of nausea, vomiting, decreased p.o. intake and 24 hours of diarrhea. On the day of admission she developed chest pressure, moderate in intensity, lasting for about an hour with no radiation, associated with dyspnea. She is able to do her routine activities without angina. Had a stress test about a year ago, she has beenwithoutair conditioning for the last 3 days,she lost her husband about a year ago.Initial physical examination blood pressure 144/52, heart rate 68, respiratory rate14, temperature 98.3, oxygen saturation 100%. Lungs clear to auscultation bilaterally, heart S1-S2 present and rhythmic, abdomen soft nontender, no lower extremity edema. Sodium 134, potassium 3.6, chloride 101, bicarb 20, glucose 106, BUN 58, creatinine 2.7, troponin 4.49,white count 7.8, hemoglobin 9.1, hematocrit 28.7, platelets 365.Troponin I4.49/4.36.[facility] andwith inferior lateral ST depressions.  Patient was admitted to the hospital with working diagnosis of non-ST elevation myocardial infarctioncomplicated with AKI on CKD stage IV   Assessment & Plan:   Principal Problem:   NSTEMI (non-ST elevated myocardial infarction) ([place]) Active Problems:   Type 2 diabetes mellitus with hypoglycemia without coma ([place])   Anemia due to chronic kidney disease   CKD (chronic kidney disease), stage IV ([facility])   AKI (acute kidney injury) ([place])   CAD (coronary artery disease), native coronary artery   1.Non-ST elevation myocardial infarction (inferior lateral ST depression)/ severe two vessel disease (LAD and RCA). Continue medical  therapy with asa/ clopidogrel, atorvastatin and coreg. Current anticoagulation with heparin, plan for coronary angiography and PCI today. Patient continue to be chest pain free.   2. Pre-renal AKI on CKD stage IV (sp nephrectomy).Serum cr down to 1.54 with K at 4,2 and serum bicarbonate at 20 with chloride at 112. Has been on normal saline at 75 ml per hour, will continue hydration post coronary angiography per protocol at 100 ml per hour, and will follow on renal panel in am. Patient today with no signs of volume overload.   3. HTN.On carvedilol and amlodipine with adequate blood pressure control.   4. T2DM with hypoglycemia.persistent low capillary glucose 111, 150, 165, 102, 93, despite IV dextrose, will continue glucose moniotirng. Patient with poor oral intake, hold on insulin coverage for now.  Will hold on dextrose IV to avoid volume overload, continue to encourage po intake. Resume diet post cath.   5. Iron deficiency anemia.(transferrin saturation of 9, ferritin 1, serum iron 22, and TIBC 252). Sp IV Iron. Hb and hct continue to be stable, continue heparin per acs protocol for now.    DVT prophylaxis:IV heparin Code Status:full Family Communication:no family at the bedside Disposition Plan/ discharge barriers:Pending PCI 07/22.   Consultants:  Cardiology  Procedures:  Cardiac craterization   Subjective: No chest pain during her hospitalization, denies and dyspnea or cough. No edema. Poor oral intake, no nausea or vomiting.   Objective: Vitals:   07/15/18 2000 07/16/18 0022 07/16/18 0506 07/16/18 0746  BP: (!) 140/39 (!) 140/45 (!) 138/49 (!) 160/45  Pulse: 64 67 62 64  Resp: 12 17 13 10   Temp: 98.9 F (37.2 C) 98.7 F (37.1 C) 98.9 F (37.2 C) 98.3 F (36.8 C)  TempSrc: Oral Oral Oral Oral  SpO2: 100%  100%  97%  Weight:   57.6 kg (127 lb)   Height:        Intake/Output Summary (Last 24 hours) at 07/16/2018 0846 Last data filed at  07/16/2018 0843 Gross per 24 hour  Intake 0 ml  Output 1300 ml  Net -1300 ml   Filed Weights   07/14/18 0519 07/15/18 0452 07/16/18 0506  Weight: 57.2 kg (126 lb) 57.7 kg (127 lb 1.6 oz) 57.6 kg (127 lb)    Examination:   General: Not in pain or dyspnea, deconditioned  Neurology: Awake and alert, non focal  E ENT: mild pallor, no icterus, oral mucosa moist Cardiovascular: No JVD. S1-S2 present, rhythmic, no gallops, rubs, or murmurs. No lower extremity edema. Pulmonary: positive breath sounds bilaterally, adequate air movement, no wheezing, rhonchi or rales. Gastrointestinal. Abdomen with no organomegaly, non tender, no rebound or guarding Skin. No rashes Musculoskeletal: no joint deformities     Data Reviewed: I have personally reviewed following labs and imaging studies  CBC: Recent Labs  Lab 07/11/18 0250 07/12/18 0349 07/13/18 0513 07/14/18 0444 07/15/18 0411 07/16/18 0646  WBC 7.8 5.4 4.2 7.7 5.8 6.1  NEUTROABS 5.5 3.5  --   --   --   --   HGB 9.1* 8.9* 8.5* 8.3* 8.1* 8.4*  HCT 28.7* 28.7* 27.0* 26.2* 25.5* 27.1*  MCV 84.7 84.7 85.2 84.2 85.3 86.3  PLT 365 329 277 294 280 992   Basic Metabolic Panel: Recent Labs  Lab 07/12/18 0349 07/13/18 0513 07/14/18 0444 07/15/18 0411 07/16/18 0646  NA 136 138 138 140 141  K 4.1 4.4 4.3 4.3 4.2  CL 107 112* 113* 112* 112*  CO2 21* 20* 20* 21* 20*  GLUCOSE 125* 87 96 100* 95  BUN 42* 31* 24* 16 13  CREATININE 2.26* 1.81* 1.62* 1.61* 1.54*  CALCIUM 7.8* 7.8* 8.0* 8.3* 8.3*   GFR: Estimated Creatinine Clearance: 23.8 mL/min (A) (by C-G formula based on SCr of 1.54 mg/dL (H)). Liver Function Tests: Recent Labs  Lab 07/11/18 0250  AST 21  ALT 14  ALKPHOS 49  BILITOT 0.5  PROT 5.7*  ALBUMIN 3.0*   No results for input(s): LIPASE, AMYLASE in the last 168 hours. No results for input(s): AMMONIA in the last 168 hours. Coagulation Profile: Recent Labs  Lab 07/14/18 0444  INR 1.11   Cardiac  Enzymes: Recent Labs  Lab 07/11/18 0250 07/11/18 0543 07/11/18 0842  TROPONINI 4.49* 4.36* 3.99*   BNP (last 3 results) No results for input(s): PROBNP in the last 8760 hours. HbA1C: Recent Labs    07/14/18 0444  HGBA1C 5.6   CBG: Recent Labs  Lab 07/15/18 0724 07/15/18 1122 07/15/18 1655 07/15/18 2139 07/16/18 0744  GLUCAP 97 111* 150* 165* 102*   Lipid Profile: No results for input(s): CHOL, HDL, LDLCALC, TRIG, CHOLHDL, LDLDIRECT in the last 72 hours. Thyroid Function Tests: Recent Labs    07/14/18 0444  TSH 2.332   Anemia Panel: No results for input(s): VITAMINB12, FOLATE, FERRITIN, TIBC, IRON, RETICCTPCT in the last 72 hours.    Radiology Studies: I have reviewed all of the imaging during this hospital visit personally     Scheduled Meds: . amLODipine  5 mg Oral Daily  . aspirin EC  81 mg Oral Daily  . atorvastatin  40 mg Oral q1800  . carvedilol  3.125 mg Oral BID WC  . clopidogrel  75 mg Oral Daily  . sodium chloride flush  3 mL Intravenous Q12H  . sodium chloride flush  3 mL Intravenous Q12H   Continuous Infusions: . sodium chloride    . sodium chloride    . dextrose 5 % and 0.9% NaCl 75 mL/hr at 07/16/18 0559  . heparin 800 Units/hr (07/15/18 0516)     LOS: 5 days        Ronesha Heenan Gerome Apley, MD [facility] Pager [phone]

## 2018-07-16 NOTE — Progress Notes (Signed)
Progress Note  Patient Name: Gabrielle [name] Date of Encounter: 07/16/2018  Primary Cardiologist: Quay Burow, MD   Subjective   [name] was admitted on 07/11/2018 with a "non-STEMI".  Her troponins peaked in the 4 range.  She had cardiac catheterization performed by Dr. Irish Lack 07/13/2018 revealing a moderately severe calcified 75% proximal LAD stenosis and a 95 to 99% calcified proximal RCA stenosis.  She has normal LV function by 2D echo.  She is pain-free.  Plan for PCI by Dr. Irish Lack today.  Inpatient Medications    Scheduled Meds: . amLODipine  5 mg Oral Daily  . aspirin EC  81 mg Oral Daily  . atorvastatin  40 mg Oral q1800  . carvedilol  3.125 mg Oral BID WC  . clopidogrel  75 mg Oral Daily  . sodium chloride flush  3 mL Intravenous Q12H  . sodium chloride flush  3 mL Intravenous Q12H   Continuous Infusions: . sodium chloride    . sodium chloride    . dextrose 5 % and 0.9% NaCl 75 mL/hr at 07/16/18 0559  . heparin 800 Units/hr (07/15/18 0516)   PRN Meds: sodium chloride, sodium chloride, acetaminophen, nitroGLYCERIN, ondansetron (ZOFRAN) IV, sodium chloride flush, sodium chloride flush, zolpidem   Vital Signs    Vitals:   07/15/18 2000 07/16/18 0022 07/16/18 0506 07/16/18 0746  BP: (!) 140/39 (!) 140/45 (!) 138/49 (!) 160/45  Pulse: 64 67 62 64  Resp: 12 17 13 10   Temp: 98.9 F (37.2 C) 98.7 F (37.1 C) 98.9 F (37.2 C) 98.3 F (36.8 C)  TempSrc: Oral Oral Oral Oral  SpO2: 100%  100% 97%  Weight:   127 lb (57.6 kg)   Height:        Intake/Output Summary (Last 24 hours) at 07/16/2018 0913 Last data filed at 07/16/2018 0843 Gross per 24 hour  Intake 0 ml  Output 1300 ml  Net -1300 ml   Filed Weights   07/14/18 0519 07/15/18 0452 07/16/18 0506  Weight: 126 lb (57.2 kg) 127 lb 1.6 oz (57.7 kg) 127 lb (57.6 kg)    Telemetry    Sinus rhythm- Personally Reviewed  ECG    Not performed today- Personally Reviewed  Physical Exam   GEN: No  acute distress.   Neck: No JVD Cardiac: RRR, no murmurs, rubs, or gallops.  Respiratory: Clear to auscultation bilaterally. GI: Soft, nontender, non-distended  MS: No edema; No deformity. Neuro:  Nonfocal  Psych: Normal affect   Labs    Chemistry Recent Labs  Lab 07/11/18 0250  07/14/18 0444 07/15/18 0411 07/16/18 0646  NA 134*   < > 138 140 141  K 3.6   < > 4.3 4.3 4.2  CL 101   < > 113* 112* 112*  CO2 20*   < > 20* 21* 20*  GLUCOSE 106*   < > 96 100* 95  BUN 58*   < > 24* 16 13  CREATININE 2.77*   < > 1.62* 1.61* 1.54*  CALCIUM 8.0*   < > 8.0* 8.3* 8.3*  PROT 5.7*  --   --   --   --   ALBUMIN 3.0*  --   --   --   --   AST 21  --   --   --   --   ALT 14  --   --   --   --   ALKPHOS 49  --   --   --   --  BILITOT 0.5  --   --   --   --   GFRNONAA 15*   < > 29* 29* 31*  GFRAA 18*   < > 33* 34* 36*  ANIONGAP 13   < > 5 7 9    < > = values in this interval not displayed.     Hematology Recent Labs  Lab 07/14/18 0444 07/15/18 0411 07/16/18 0646  WBC 7.7 5.8 6.1  RBC 3.11* 2.99* 3.14*  HGB 8.3* 8.1* 8.4*  HCT 26.2* 25.5* 27.1*  MCV 84.2 85.3 86.3  MCH 26.7 27.1 26.8  MCHC 31.7 31.8 31.0  RDW 16.0* 15.9* 16.0*  PLT 294 280 257    Cardiac Enzymes Recent Labs  Lab 07/11/18 0250 07/11/18 0543 07/11/18 0842  TROPONINI 4.49* 4.36* 3.99*   No results for input(s): TROPIPOC in the last 168 hours.   BNPNo results for input(s): BNP, PROBNP in the last 168 hours.   DDimer No results for input(s): DDIMER in the last 168 hours.   Radiology    No results found.  Cardiac Studies   2D echocardiogram (07/11/2018)  Study Conclusions  - Left ventricle: The cavity size was normal. There was moderate   concentric hypertrophy. Systolic function was normal. The   estimated ejection fraction was in the range of 55% to 60%. Wall   motion was normal; there were no regional wall motion   abnormalities. Doppler parameters are consistent with abnormal   left  ventricular relaxation (grade 1 diastolic dysfunction).   Doppler parameters are consistent with high ventricular filling   pressure. - Aortic valve: Transvalvular velocity was within the normal range.   There was no stenosis. There was trivial regurgitation. - Mitral valve: Calcified annulus. Transvalvular velocity was   within the normal range. There was no evidence for stenosis.   There was mild regurgitation. - Right ventricle: The cavity size was normal. Wall thickness was   normal. Systolic function was normal. - Atrial septum: No defect or patent foramen ovale was identified   by color flow Doppler. - Tricuspid valve: There was trivial regurgitation. - Pulmonary arteries: Systolic pressure was within the normal   range. PA peak pressure: 29 mm Hg (S).  Cardiac catheterization (07/13/2018)   Conclusion     Dist LM lesion is 25% stenosed.  Ost LAD lesion is 50% stenosed.  Prox LAD lesion is 75% stenosed.  Ost 1st Diag lesion is 99% stenosed.  Prox RCA lesion is 99% stenosed.  There is no aortic valve stenosis.  LV end diastolic pressure is mildly elevated.  Tortuosity in the right subclavian makes torquing catheter for the RCA somewhat difficult.   COntinue aspirin.  Get CVTS consult for severe multivessel calcific CAD.      Patient Profile     83 y.o. female admitted on 07/11/2018 with a non-STEMI and a troponin that peaked in the 4 range.  She does have a history of PAD status post left carotid endarterectomy remotely, bilateral renal artery disease with an occluded right renal artery status post left renal artery stenting.  She has bilateral iliac disease left greater than right and occluded SFAs bilaterally.  Assessment & Plan    1: Non-STEMI- patient admitted with chest pressure and positive enzymes with a troponin in the 4 range.  Cardiac catheterization performed on 07/13/2018 revealed two-vessel disease with 75% calcified proximal LAD and 95 to 90%  calcified proximal dominant RCA.  She does have chronic renal insufficiency and was hydrated over the weekend with  creatinines in the 1 5 range today.  She is scheduled for LAD plus or minus RCA intervention today with no contrast.  She will be done through the right femoral approach given the tortuosity of her innominate system.  2: Chronic renal insufficiency- serum creatinine 1.54 down from 1.81 with hydration.  She does have a solitary kidney with a known occluded right renal artery status post left renal artery stenting by myself remotely.     For questions or updates, please contact [place] Please consult [email or web address] for contact info under Cardiology/STEMI.      Signed, Quay Burow, MD  07/16/2018, 9:13 AM

## 2018-07-16 NOTE — H&P (View-Only) (Signed)
Progress Note  Patient Name: Gabrielle [name] Date of Encounter: 07/16/2018  Primary Cardiologist: Quay Burow, MD   Subjective   [name] was admitted on 07/11/2018 with a "non-STEMI".  Her troponins peaked in the 4 range.  She had cardiac catheterization performed by Dr. Irish Lack 07/13/2018 revealing a moderately severe calcified 75% proximal LAD stenosis and a 95 to 99% calcified proximal RCA stenosis.  She has normal LV function by 2D echo.  She is pain-free.  Plan for PCI by Dr. Irish Lack today.  Inpatient Medications    Scheduled Meds: . amLODipine  5 mg Oral Daily  . aspirin EC  81 mg Oral Daily  . atorvastatin  40 mg Oral q1800  . carvedilol  3.125 mg Oral BID WC  . clopidogrel  75 mg Oral Daily  . sodium chloride flush  3 mL Intravenous Q12H  . sodium chloride flush  3 mL Intravenous Q12H   Continuous Infusions: . sodium chloride    . sodium chloride    . dextrose 5 % and 0.9% NaCl 75 mL/hr at 07/16/18 0559  . heparin 800 Units/hr (07/15/18 0516)   PRN Meds: sodium chloride, sodium chloride, acetaminophen, nitroGLYCERIN, ondansetron (ZOFRAN) IV, sodium chloride flush, sodium chloride flush, zolpidem   Vital Signs    Vitals:   07/15/18 2000 07/16/18 0022 07/16/18 0506 07/16/18 0746  BP: (!) 140/39 (!) 140/45 (!) 138/49 (!) 160/45  Pulse: 64 67 62 64  Resp: 12 17 13 10   Temp: 98.9 F (37.2 C) 98.7 F (37.1 C) 98.9 F (37.2 C) 98.3 F (36.8 C)  TempSrc: Oral Oral Oral Oral  SpO2: 100%  100% 97%  Weight:   127 lb (57.6 kg)   Height:        Intake/Output Summary (Last 24 hours) at 07/16/2018 0913 Last data filed at 07/16/2018 0843 Gross per 24 hour  Intake 0 ml  Output 1300 ml  Net -1300 ml   Filed Weights   07/14/18 0519 07/15/18 0452 07/16/18 0506  Weight: 126 lb (57.2 kg) 127 lb 1.6 oz (57.7 kg) 127 lb (57.6 kg)    Telemetry    Sinus rhythm- Personally Reviewed  ECG    Not performed today- Personally Reviewed  Physical Exam   GEN: No  acute distress.   Neck: No JVD Cardiac: RRR, no murmurs, rubs, or gallops.  Respiratory: Clear to auscultation bilaterally. GI: Soft, nontender, non-distended  MS: No edema; No deformity. Neuro:  Nonfocal  Psych: Normal affect   Labs    Chemistry Recent Labs  Lab 07/11/18 0250  07/14/18 0444 07/15/18 0411 07/16/18 0646  NA 134*   < > 138 140 141  K 3.6   < > 4.3 4.3 4.2  CL 101   < > 113* 112* 112*  CO2 20*   < > 20* 21* 20*  GLUCOSE 106*   < > 96 100* 95  BUN 58*   < > 24* 16 13  CREATININE 2.77*   < > 1.62* 1.61* 1.54*  CALCIUM 8.0*   < > 8.0* 8.3* 8.3*  PROT 5.7*  --   --   --   --   ALBUMIN 3.0*  --   --   --   --   AST 21  --   --   --   --   ALT 14  --   --   --   --   ALKPHOS 49  --   --   --   --  BILITOT 0.5  --   --   --   --   GFRNONAA 15*   < > 29* 29* 31*  GFRAA 18*   < > 33* 34* 36*  ANIONGAP 13   < > 5 7 9    < > = values in this interval not displayed.     Hematology Recent Labs  Lab 07/14/18 0444 07/15/18 0411 07/16/18 0646  WBC 7.7 5.8 6.1  RBC 3.11* 2.99* 3.14*  HGB 8.3* 8.1* 8.4*  HCT 26.2* 25.5* 27.1*  MCV 84.2 85.3 86.3  MCH 26.7 27.1 26.8  MCHC 31.7 31.8 31.0  RDW 16.0* 15.9* 16.0*  PLT 294 280 257    Cardiac Enzymes Recent Labs  Lab 07/11/18 0250 07/11/18 0543 07/11/18 0842  TROPONINI 4.49* 4.36* 3.99*   No results for input(s): TROPIPOC in the last 168 hours.   BNPNo results for input(s): BNP, PROBNP in the last 168 hours.   DDimer No results for input(s): DDIMER in the last 168 hours.   Radiology    No results found.  Cardiac Studies   2D echocardiogram (07/11/2018)  Study Conclusions  - Left ventricle: The cavity size was normal. There was moderate   concentric hypertrophy. Systolic function was normal. The   estimated ejection fraction was in the range of 55% to 60%. Wall   motion was normal; there were no regional wall motion   abnormalities. Doppler parameters are consistent with abnormal   left  ventricular relaxation (grade 1 diastolic dysfunction).   Doppler parameters are consistent with high ventricular filling   pressure. - Aortic valve: Transvalvular velocity was within the normal range.   There was no stenosis. There was trivial regurgitation. - Mitral valve: Calcified annulus. Transvalvular velocity was   within the normal range. There was no evidence for stenosis.   There was mild regurgitation. - Right ventricle: The cavity size was normal. Wall thickness was   normal. Systolic function was normal. - Atrial septum: No defect or patent foramen ovale was identified   by color flow Doppler. - Tricuspid valve: There was trivial regurgitation. - Pulmonary arteries: Systolic pressure was within the normal   range. PA peak pressure: 29 mm Hg (S).  Cardiac catheterization (07/13/2018)   Conclusion     Dist LM lesion is 25% stenosed.  Ost LAD lesion is 50% stenosed.  Prox LAD lesion is 75% stenosed.  Ost 1st Diag lesion is 99% stenosed.  Prox RCA lesion is 99% stenosed.  There is no aortic valve stenosis.  LV end diastolic pressure is mildly elevated.  Tortuosity in the right subclavian makes torquing catheter for the RCA somewhat difficult.   COntinue aspirin.  Get CVTS consult for severe multivessel calcific CAD.      Patient Profile     83 y.o. female admitted on 07/11/2018 with a non-STEMI and a troponin that peaked in the 4 range.  She does have a history of PAD status post left carotid endarterectomy remotely, bilateral renal artery disease with an occluded right renal artery status post left renal artery stenting.  She has bilateral iliac disease left greater than right and occluded SFAs bilaterally.  Assessment & Plan    1: Non-STEMI- patient admitted with chest pressure and positive enzymes with a troponin in the 4 range.  Cardiac catheterization performed on 07/13/2018 revealed two-vessel disease with 75% calcified proximal LAD and 95 to 90%  calcified proximal dominant RCA.  She does have chronic renal insufficiency and was hydrated over the weekend with  creatinines in the 1 5 range today.  She is scheduled for LAD plus or minus RCA intervention today with no contrast.  She will be done through the right femoral approach given the tortuosity of her innominate system.  2: Chronic renal insufficiency- serum creatinine 1.54 down from 1.81 with hydration.  She does have a solitary kidney with a known occluded right renal artery status post left renal artery stenting by myself remotely.     For questions or updates, please contact [place] Please consult [email or web address] for contact info under Cardiology/STEMI.      Signed, Quay Burow, MD  07/16/2018, 9:13 AM

## 2018-07-17 ENCOUNTER — Encounter (HOSPITAL_COMMUNITY): Payer: Self-pay | Admitting: Interventional Cardiology

## 2018-07-17 ENCOUNTER — Encounter (HOSPITAL_COMMUNITY): Payer: Self-pay

## 2018-07-17 ENCOUNTER — Telehealth: Payer: Self-pay | Admitting: Cardiovascular Disease

## 2018-07-17 LAB — BASIC METABOLIC PANEL
Anion gap: 6 (ref 5–15)
BUN: 14 mg/dL (ref 8–23)
CO2: 21 mmol/L — ABNORMAL LOW (ref 22–32)
Calcium: 8.3 mg/dL — ABNORMAL LOW (ref 8.9–10.3)
Chloride: 109 mmol/L (ref 98–111)
Creatinine, Ser: 1.35 mg/dL — ABNORMAL HIGH (ref 0.44–1.00)
GFR calc Af Amer: 42 mL/min — ABNORMAL LOW (ref >60)
GFR calc non Af Amer: 36 mL/min — ABNORMAL LOW (ref >60)
Glucose, Bld: 86 mg/dL (ref 70–99)
Potassium: 3.9 mmol/L (ref 3.5–5.1)
Sodium: 136 mmol/L (ref 135–145)

## 2018-07-17 LAB — CBC
HCT: 24.7 % — ABNORMAL LOW (ref 36.0–46.0)
Hemoglobin: 7.8 g/dL — ABNORMAL LOW (ref 12.0–15.0)
MCH: 26.8 pg (ref 26.0–34.0)
MCHC: 31.6 g/dL (ref 30.0–36.0)
MCV: 84.9 fL (ref 78.0–100.0)
Platelets: 272 10*3/uL (ref 150–400)
RBC: 2.91 MIL/uL — ABNORMAL LOW (ref 3.87–5.11)
RDW: 16.1 % — ABNORMAL HIGH (ref 11.5–15.5)
WBC: 7.1 10*3/uL (ref 4.0–10.5)

## 2018-07-17 LAB — GLUCOSE, CAPILLARY
Glucose-Capillary: 74 mg/dL (ref 70–99)
Glucose-Capillary: 93 mg/dL (ref 70–99)
Glucose-Capillary: 177 mg/dL — ABNORMAL HIGH (ref 70–99)

## 2018-07-17 MED ORDER — WHITE PETROLATUM EX OINT
TOPICAL_OINTMENT | CUTANEOUS | Status: AC
  Filled 2018-07-17: qty 28.35

## 2018-07-17 MED ORDER — LIP MEDEX EX OINT
TOPICAL_OINTMENT | CUTANEOUS | Status: DC | PRN
  Filled 2018-07-17: qty 7

## 2018-07-17 MED FILL — Nitroglycerin IV Soln 100 MCG/ML in D5W: INTRA_ARTERIAL | Qty: 10 | Status: AC

## 2018-07-17 NOTE — Evaluation (Addendum)
Physical Therapy Evaluation Patient Details Name: Gabrielle [name] MRN: [id number] DOB: 11-22-1935 Today's Date: 07/17/2018   History of Present Illness  Pt is an 83 y/o female admitted secondary to chest pressure. Found to have an NSTEMI. Pt is s/p L heart cath and PCI placement. PMH includes DM, CKD, CAD, HTN, CVA, dCHF, and renal cell carcinoma s/p nephrectomy.   Clinical Impression  Pt admitted secondary to problem above with deficits below. Pt very limited secondary to reports of dizziness and shakiness in BLE. Unable to tolerate full set of orthostatic vitals, however, did demonstrate drop in SBP from supine to sitting; see below. Pt requiring min A to stand at EOB, and only able to tolerate short period in standing, as pt's legs very fatigued and shaky. Pt reports daughter works during the day and does not have assist during that time. Feel pt is currently a high fall risk and would benefit from short term SNF prior to d/c home. Will continue to follow acutely to maximize functional mobility independence and safety.   Orthostatics: (RN aware of lower DBP and cleared for participation) Supine: 158/38 mmHg Seated: 138/41 mmHg Standing: 131/58mmHg (unable to stand for complete BP measurement)    Follow Up Recommendations SNF;Supervision/Assistance - 24 hour    Equipment Recommendations  None recommended by PT    Recommendations for Other Services OT consult     Precautions / Restrictions Precautions Precautions: Fall;Other (comment) Precaution Comments: Watch BP Restrictions Weight Bearing Restrictions: No      Mobility  Bed Mobility Overal bed mobility: Needs Assistance Bed Mobility: Supine to Sit     Supine to sit: Min guard     General bed mobility comments: Min guard for safety. Increased time required. Increased dizziness, and noted orthostatic SBP.   Transfers Overall transfer level: Needs assistance Equipment used: 1 person hand held assist Transfers: Sit  to/from Stand Sit to Stand: Min assist         General transfer comment: Min A for lift assist and steadying. Attempted to take BP in standing to check orthostatics, however, pt unable to tolerate standing for length of BP reading. Pt reported increased dizziness and had increased LE shaking in standing, therefore required seated rest.   Ambulation/Gait             General Gait Details: Deferred.   Stairs            Wheelchair Mobility    Modified Rankin (Stroke Patients Only)       Balance Overall balance assessment: Needs assistance Sitting-balance support: No upper extremity supported;Feet supported Sitting balance-Leahy Scale: Fair     Standing balance support: Bilateral upper extremity supported;During functional activity Standing balance-Leahy Scale: Poor Standing balance comment: Reliant on UE support                              Pertinent Vitals/Pain Pain Assessment: Faces Faces Pain Scale: Hurts a little bit Pain Location: R hip  Pain Descriptors / Indicators: Grimacing;Guarding Pain Intervention(s): Limited activity within patient's tolerance;Monitored during session;Repositioned    Home Living Family/patient expects to be discharged to:: Private residence Living Arrangements: Children Available Help at Discharge: Family;Available PRN/intermittently Type of Home: House Home Access: Stairs to enter Entrance Stairs-Rails: None Entrance Stairs-Number of Steps: 2 Home Layout: One level Home Equipment: Walker - 2 wheels;Cane - single point;Wheelchair - manual      Prior Function Level of Independence: Independent  Hand Dominance   Dominant Hand: Right    Extremity/Trunk Assessment   Upper Extremity Assessment Upper Extremity Assessment: Defer to OT evaluation    Lower Extremity Assessment Lower Extremity Assessment: RLE deficits/detail RLE Deficits / Details: R hip pain at baseline  RLE Sensation:  decreased light touch(in foot )    Cervical / Trunk Assessment Cervical / Trunk Assessment: Normal  Communication   Communication: No difficulties  Cognition Arousal/Alertness: Awake/alert Behavior During Therapy: WFL for tasks assessed/performed Overall Cognitive Status: Within Functional Limits for tasks assessed                                        General Comments General comments (skin integrity, edema, etc.): Noted some saccades during horizontal gaze testing, especially with R lateral gaze.     Exercises     Assessment/Plan    PT Assessment Patient needs continued PT services  PT Problem List Cardiopulmonary status limiting activity;Decreased strength;Decreased balance;Decreased activity tolerance;Decreased mobility;Decreased knowledge of use of DME       PT Treatment Interventions DME instruction;Gait training;Stair training;Functional mobility training;Therapeutic activities;Therapeutic exercise;Balance training;Patient/family education    PT Goals (Current goals can be found in the Care Plan section)  Acute Rehab PT Goals Patient Stated Goal: "to feel better"  PT Goal Formulation: With patient Time For Goal Achievement: 07/31/18 Potential to Achieve Goals: Fair    Frequency Min 3X/week   Barriers to discharge Decreased caregiver support      Co-evaluation               AM-PAC PT "6 Clicks" Daily Activity  Outcome Measure Difficulty turning over in bed (including adjusting bedclothes, sheets and blankets)?: A Little Difficulty moving from lying on back to sitting on the side of the bed? : Unable Difficulty sitting down on and standing up from a chair with arms (e.g., wheelchair, bedside commode, etc,.)?: Unable Help needed moving to and from a bed to chair (including a wheelchair)?: A Little Help needed walking in hospital room?: A Lot Help needed climbing 3-5 steps with a railing? : A Lot 6 Click Score: 12    End of Session  Equipment Utilized During Treatment: Gait belt Activity Tolerance: Treatment limited secondary to medical complications (Comment)(Dizziness, orthostatics ) Patient left: in bed;with call bell/phone within reach;with bed alarm set;with nursing/sitter in room Nurse Communication: Mobility status;Other (comment)(BP and dizziness ) PT Visit Diagnosis: Unsteadiness on feet (R26.81);Muscle weakness (generalized) (M62.81)    Time: 1950-9326 PT Time Calculation (min) (ACUTE ONLY): 18 min   Charges:   PT Evaluation $PT Eval Moderate Complexity: 1 Mod     PT G Codes:        Leighton Ruff, PT, DPT  Acute Rehabilitation Services  Pager: [phone]   Rudean Hitt 07/17/2018, 1:11 PM

## 2018-07-17 NOTE — Progress Notes (Signed)
0850 Pt c/o ringing in ears and head. Stated this is her baseline but informed MD it is worse today. Stated she saw an ENT but they can't do anything for her. MD keeping the pt one more day and consulted physical therapy. Pt informed, resting in bed. WCTM.

## 2018-07-17 NOTE — Evaluation (Addendum)
Occupational Therapy Evaluation Patient Details Name: Gabrielle [name] MRN: [id number] DOB: 1935/12/18 Today's Date: 07/17/2018    History of Present Illness Pt is an 83 y/o female admitted secondary to chest pressure. Found to have an NSTEMI. Pt is s/p L heart cath and PCI placement. PMH includes DM, CKD, CAD, HTN, CVA, dCHF, and renal cell carcinoma s/p nephrectomy.    Clinical Impression   PTA patient independent with ADL and mobility, limited IADLs.  She currently requires setup assist for UB ADL seated, min guard to min assist for LB ADL, min guard for grooming standing at sink and min guard for transfers.  She presents with generalized weakness, B LE buckling/shakiness intermittently (improved with distraction), and decreased safety awareness.  Initially reports ringing in her ears and continuously reports numbness around her mouth, asymptomatic with BP during session, RN aware.  Educated on safety, precautions, mobility and ADLs. Based on performance today, believe patient will benefit from short stay SNF rehab in order to maximize independence and safety prior to dc home as patients daughter works and she will be alone during the day.  Will continue to follow.     Follow Up Recommendations  SNF;Supervision/Assistance - 24 hour    Equipment Recommendations  Other (comment)(TBD at next venue of care)    Recommendations for Other Services       Precautions / Restrictions Precautions Precautions: Fall;Other (comment) Precaution Comments: Watch BP Restrictions Weight Bearing Restrictions: No      Mobility Bed Mobility Overal bed mobility: Needs Assistance Bed Mobility: Supine to Sit;Sit to Supine     Supine to sit: Supervision;HOB elevated Sit to supine: Supervision   General bed mobility comments: increased time and effort, to/from L side of bed  Transfers Overall transfer level: Needs assistance Equipment used: Rolling walker (2 wheeled) Transfers: Sit to/from  Stand Sit to Stand: Min guard         General transfer comment: min guard for transfers, cueing for hand placement and safety; intermittent B knee buckling and shakyness which decreased with distractions    Balance Overall balance assessment: Needs assistance Sitting-balance support: No upper extremity supported;Feet supported Sitting balance-Leahy Scale: Fair     Standing balance support: Bilateral upper extremity supported;During functional activity Standing balance-Leahy Scale: Poor Standing balance comment: Reliant on UE support                            ADL either performed or assessed with clinical judgement   ADL Overall ADL's : Needs assistance/impaired Eating/Feeding: Set up;Sitting   Grooming: Min guard;Standing   Upper Body Bathing: Set up;Sitting   Lower Body Bathing: Min guard;Sit to/from stand   Upper Body Dressing : Set up;Sitting   Lower Body Dressing: Sit to/from stand;Minimal assistance   Toilet Transfer: Min guard;RW;Ambulation(simulated to/from EOB) Toilet Transfer Details (indicate cue type and reason): cueing for hand placement and safety  Toileting- Clothing Manipulation and Hygiene: Min guard;Sit to/from stand       Functional mobility during ADLs: Min guard;Rolling walker;Cueing for safety General ADL Comments: Patient requires increased time for all activities.  Intermittent B LE buckling d/t weakness     Vision Baseline Vision/History: No visual deficits Patient Visual Report: No change from baseline Vision Assessment?: Yes Tracking/Visual Pursuits: Able to track stimulus in all quads without difficulty     Perception     Praxis      Pertinent Vitals/Pain Pain Assessment: No/denies pain     Hand  Dominance Right   Extremity/Trunk Assessment Upper Extremity Assessment Upper Extremity Assessment: Generalized weakness   Lower Extremity Assessment Lower Extremity Assessment: Defer to PT evaluation   Cervical / Trunk  Assessment Cervical / Trunk Assessment: Normal   Communication Communication Communication: No difficulties   Cognition Arousal/Alertness: Awake/alert Behavior During Therapy: WFL for tasks assessed/performed Overall Cognitive Status: Impaired/Different from baseline Area of Impairment: Problem solving;Following commands                       Following Commands: Follows multi-step commands with increased time     Problem Solving: Decreased initiation;Slow processing;Requires verbal cues General Comments: Patient requires increased time for processing and intation.   General Comments  complaints of face numbness around mouth     Exercises     Shoulder Instructions      Home Living Family/patient expects to be discharged to:: Private residence Living Arrangements: Children Available Help at Discharge: Family;Available PRN/intermittently Type of Home: House Home Access: Stairs to enter [employer] of Steps: 2 Entrance Stairs-Rails: None Home Layout: One level     Bathroom Shower/Tub: Teacher, early years/pre: Standard     Home Equipment: Environmental consultant - 2 wheels;Cane - single point;Wheelchair - manual   Additional Comments: stood to shower      Prior Functioning/Environment Level of Independence: Independent        Comments: independent ADLs, limited IADLs        OT Problem List: Decreased strength;Impaired balance (sitting and/or standing);Decreased activity tolerance;Decreased safety awareness;Decreased knowledge of use of DME or AE;Decreased knowledge of precautions      OT Treatment/Interventions: Self-care/ADL training;Therapeutic exercise;Energy conservation;Balance training;Patient/family education;Therapeutic activities    OT Goals(Current goals can be found in the care plan section) Acute Rehab OT Goals Patient Stated Goal: "to feel better"  OT Goal Formulation: With patient Time For Goal Achievement: 07/31/18 Potential to  Achieve Goals: Good  OT Frequency: Min 2X/week   Barriers to D/C:            Co-evaluation              AM-PAC PT "6 Clicks" Daily Activity     Outcome Measure Help from another person eating meals?: None Help from another person taking care of personal grooming?: A Little Help from another person toileting, which includes using toliet, bedpan, or urinal?: A Little Help from another person bathing (including washing, rinsing, drying)?: A Little Help from another person to put on and taking off regular upper body clothing?: None Help from another person to put on and taking off regular lower body clothing?: A Little 6 Click Score: 20   End of Session Equipment Utilized During Treatment: Gait belt;Rolling walker Nurse Communication: Mobility status  Activity Tolerance: Patient tolerated treatment well Patient left: in bed;with call bell/phone within reach;with bed alarm set  OT Visit Diagnosis: Other abnormalities of gait and mobility (R26.89);Muscle weakness (generalized) (M62.81)                Time: 6063-0160 OT Time Calculation (min): 24 min Charges:  OT General Charges $OT Visit: 1 Visit OT Evaluation $OT Eval Moderate Complexity: 1 Mod OT Treatments $Self Care/Home Management : 8-22 mins G-Codes:     Delight Stare, OTR/L  Pager [phone]   Delight Stare 07/17/2018, 3:50 PM

## 2018-07-17 NOTE — Progress Notes (Signed)
PROGRESS NOTE    NATEISHA MOYD  EXB:[id number] DOB: Apr 12, 1935 DOA: 07/11/2018 PCP: Forrest Moron, MD    Brief Narrative:  83 year old female who presented with chest pressure. She does have a significant past medical history for diastolic heart failure, renal cell carcinoma status post nephrectomy, chronic kidney disease and history of CVA.Reported3 days history of nausea, vomiting, decreased p.o. intake and 24 hours of diarrhea. On the day of admission she developed chest pressure, moderate in intensity, lasting for about an hour with no radiation, associated with dyspnea. She is able to do her routine activities without angina. Had a stress test about a year ago, she has beenwithoutair conditioning for the last 3 days,she lost her husband about a year ago.Initial physical examination blood pressure 144/52, heart rate 68, respiratory rate14, temperature 98.3, oxygen saturation 100%. Lungs clear to auscultation bilaterally, heart S1-S2 present and rhythmic, abdomen soft nontender, no lower extremity edema. Sodium 134, potassium 3.6, chloride 101, bicarb 20, glucose 106, BUN 58, creatinine 2.7, troponin 4.49,white count 7.8, hemoglobin 9.1, hematocrit 28.7, platelets 365.Troponin I4.49/4.36.[facility] andwith inferior lateral ST depressions.  Patient was admitted to the hospital with working diagnosis of non-ST elevation myocardial infarctioncomplicated with AKI on CKD stage IV  Patient was found to have two-vessel disease, CT surgery concluded hat  patient was not candidate for bypass grafting due to advance comorbidities, chronic kidney disease and deconditioning.  Once renal function improved she underwent a second cardiac catheterization for PCI to the LAD and RCA.  Assessment & Plan:   Principal Problem:   NSTEMI (non-ST elevated myocardial infarction) ([place]) Active Problems:   Type 2 diabetes mellitus with hypoglycemia without coma (HCC)   Anemia due to chronic  kidney disease   CKD (chronic kidney disease), stage IV (HCC)   AKI (acute kidney injury) ([place])   CAD (coronary artery disease), native coronary artery    1.Non-ST elevation myocardial infarction (inferior lateral ST depression)/ severe twovessel disease(LAD and RCA). Patient had successful PCI to LAD and RCA, will continue dual antiplatelet therapy with asa/ clopidogrel, continue atorvastatin and coreg. Holding ace inh due to risk of worsening renal function.  2. Pre-renal AKI on CKD stage IV (sp nephrectomy).Renal function has been stable with serum cr down to 1,35. Will hold on further IV fluids for now, will continue to avoid nephrotoxic medications, follow renal panel in am. Patient tolerating po well.   3. HTN.Tolerating well  carvedilol and amlodipine, blood pressure systolic 102'V today. No signs of volume overload.   4. T2DM with hypoglycemia.Low capillary glucose 102, 93, 91, 87, 74. Patient with very poor oral intake, continue to hold on insulin therapy. Will hold on IV dextrose for now to prevent volume overload, continue to encourage po intake, will consult nutrition.   5. Iron deficiency anemia.(transferrin saturation of 9, ferritin 1, serum iron 22, and TIBC 252). Sp IV Iron. Follow iron panel as outpatient.  6. Severe deconditioning and calorie protein malnutrition. Will consult nutrition for evaluation. Physical therapy recommends SNF at discharge.    DVT prophylaxis:heparin  Code Status:full Family Communication:no family at the bedside Disposition Plan/ discharge barriers:Pending SNF   Consultants:  Cardiology  Procedures:  Cardiac craterization     Subjective: Patient is feeling very weak and deconditioned, no chest pain or dyspnea, positive tinnitus, acute on chronic.   Objective: Vitals:   07/17/18 0733 07/17/18 0814 07/17/18 0827 07/17/18 1107  BP: (!) 134/37 (!) 156/30 (!) 147/26 (!) 135/44  Pulse: 64 66 69 61  Resp:  10 13 15 12   Temp: 98.3 F (36.8 C)   98.6 F (37 C)  TempSrc: Oral     SpO2: 100% 100% 100% 100%  Weight:      Height:        Intake/Output Summary (Last 24 hours) at 07/17/2018 1137 Last data filed at 07/17/2018 0700 Gross per 24 hour  Intake 547.95 ml  Output 500 ml  Net 47.95 ml   Filed Weights   07/15/18 0452 07/16/18 0506 07/17/18 0649  Weight: 57.7 kg (127 lb 1.6 oz) 57.6 kg (127 lb) 57.1 kg (125 lb 12.8 oz)    Examination:   General: Not in pain or dyspnea, deconditioned and ill looking appearing.  Neurology: Awake and alert, non focal  E ENT: mild pallor, no icterus, oral mucosa moist Cardiovascular: No JVD. S1-S2 present, rhythmic, no gallops, rubs, or murmurs. No lower extremity edema. Pulmonary: decreased breath sounds bilaterally due to poor inspiratory effort, adequate air movement, no wheezing, rhonchi or rales. Gastrointestinal. Abdomen with no organomegaly, non tender, no rebound or guarding Skin. No rashes Musculoskeletal: no joint deformities     Data Reviewed: I have personally reviewed following labs and imaging studies  CBC: Recent Labs  Lab 07/11/18 0250 07/12/18 0349 07/13/18 0513 07/14/18 0444 07/15/18 0411 07/16/18 0646 07/17/18 0343  WBC 7.8 5.4 4.2 7.7 5.8 6.1 7.1  NEUTROABS 5.5 3.5  --   --   --   --   --   HGB 9.1* 8.9* 8.5* 8.3* 8.1* 8.4* 7.8*  HCT 28.7* 28.7* 27.0* 26.2* 25.5* 27.1* 24.7*  MCV 84.7 84.7 85.2 84.2 85.3 86.3 84.9  PLT 365 329 277 294 280 257 573   Basic Metabolic Panel: Recent Labs  Lab 07/13/18 0513 07/14/18 0444 07/15/18 0411 07/16/18 0646 07/17/18 0343  NA 138 138 140 141 136  K 4.4 4.3 4.3 4.2 3.9  CL 112* 113* 112* 112* 109  CO2 20* 20* 21* 20* 21*  GLUCOSE 87 96 100* 95 86  BUN 31* 24* 16 13 14   CREATININE 1.81* 1.62* 1.61* 1.54* 1.35*  CALCIUM 7.8* 8.0* 8.3* 8.3* 8.3*   GFR: Estimated Creatinine Clearance: 25.1 mL/min (A) (by C-G formula based on SCr of 1.35 mg/dL (H)). Liver Function  Tests: Recent Labs  Lab 07/11/18 0250  AST 21  ALT 14  ALKPHOS 49  BILITOT 0.5  PROT 5.7*  ALBUMIN 3.0*   No results for input(s): LIPASE, AMYLASE in the last 168 hours. No results for input(s): AMMONIA in the last 168 hours. Coagulation Profile: Recent Labs  Lab 07/14/18 0444  INR 1.11   Cardiac Enzymes: Recent Labs  Lab 07/11/18 0250 07/11/18 0543 07/11/18 0842  TROPONINI 4.49* 4.36* 3.99*   BNP (last 3 results) No results for input(s): PROBNP in the last 8760 hours. HbA1C: No results for input(s): HGBA1C in the last 72 hours. CBG: Recent Labs  Lab 07/16/18 0744 07/16/18 1123 07/16/18 1741 07/16/18 2154 07/17/18 0705  GLUCAP 102* 93 91 87 74   Lipid Profile: No results for input(s): CHOL, HDL, LDLCALC, TRIG, CHOLHDL, LDLDIRECT in the last 72 hours. Thyroid Function Tests: No results for input(s): TSH, T4TOTAL, FREET4, T3FREE, THYROIDAB in the last 72 hours. Anemia Panel: No results for input(s): VITAMINB12, FOLATE, FERRITIN, TIBC, IRON, RETICCTPCT in the last 72 hours.    Radiology Studies: I have reviewed all of the imaging during this hospital visit personally     Scheduled Meds: . amLODipine  5 mg Oral Daily  . aspirin EC  81  mg Oral Daily  . atorvastatin  40 mg Oral q1800  . carvedilol  3.125 mg Oral BID WC  . clopidogrel  75 mg Oral Daily  . sodium chloride flush  3 mL Intravenous Q12H  . sodium chloride flush  3 mL Intravenous Q12H   Continuous Infusions: . sodium chloride    . sodium chloride       LOS: 6 days        Mauricio Gerome Apley, MD [facility] Pager [phone]

## 2018-07-17 NOTE — Telephone Encounter (Signed)
New Message:    FYI:   TOC appt on 07/24/18 at 9:00 with Barrett per Daleen Snook

## 2018-07-17 NOTE — Plan of Care (Signed)
Pt unable to walk with cardiac rehab because pt is complaining of ringing in her ears and head

## 2018-07-17 NOTE — Progress Notes (Signed)
1540 Reported called to Levada Dy, RN on 3E.

## 2018-07-17 NOTE — Plan of Care (Signed)
   Problem: Education: Goal: Understanding of cardiac disease, CV risk reduction, and recovery process will improve Outcome: Progressing   Problem: Activity: Goal: Ability to tolerate increased activity will improve Outcome: Progressing

## 2018-07-17 NOTE — Care Management Important Message (Signed)
Important Message  Patient Details  Name: Gabrielle [name] MRN: [id number] Date of Birth: Sep 30, 1935   Medicare Important Message Given:  Yes    Lucine Bilski P Tessy Pawelski 07/17/2018, 3:19 PM

## 2018-07-17 NOTE — Progress Notes (Signed)
Progress Note  Patient Name: Gabrielle [name] Date of Encounter: 07/17/2018  Primary Cardiologist: Quay Burow, MD   Subjective   Gabrielle [name] was admitted on 07/11/2018 with a "non-STEMI".  Her troponins peaked in the 4 range.  She had cardiac catheterization performed by Dr. Irish Lack 07/13/2018 revealing a moderately severe calcified 75% proximal LAD stenosis and a 95 to 99% calcified proximal RCA stenosis.  She has normal LV function by 2D echo.  She underwent PCI and drug-eluting stenting of the proximal LAD and orbital atherectomy, PCI and drug-eluting stenting of the proximal RCA with excellent angiographic results.  Her only complaints this morning are of weakness and hip pain but denies chest pain or shortness of breath.  Inpatient Medications    Scheduled Meds: . amLODipine  5 mg Oral Daily  . aspirin EC  81 mg Oral Daily  . atorvastatin  40 mg Oral q1800  . carvedilol  3.125 mg Oral BID WC  . clopidogrel  75 mg Oral Daily  . sodium chloride flush  3 mL Intravenous Q12H  . sodium chloride flush  3 mL Intravenous Q12H   Continuous Infusions: . sodium chloride    . sodium chloride     PRN Meds: sodium chloride, sodium chloride, acetaminophen, acetaminophen, nitroGLYCERIN, ondansetron (ZOFRAN) IV, sodium chloride flush, sodium chloride flush, zolpidem   Vital Signs    Vitals:   07/17/18 0642 07/17/18 0643 07/17/18 0649 07/17/18 0733  BP: (!) 171/39   (!) 134/37  Pulse: 70 72  64  Resp: 13 11 16 10   Temp:    98.3 F (36.8 C)  TempSrc:      SpO2: 99% 100%  100%  Weight:   125 lb 12.8 oz (57.1 kg)   Height:        Intake/Output Summary (Last 24 hours) at 07/17/2018 0737 Last data filed at 07/17/2018 0500 Gross per 24 hour  Intake 906.99 ml  Output 500 ml  Net 406.99 ml   Filed Weights   07/15/18 0452 07/16/18 0506 07/17/18 0649  Weight: 127 lb 1.6 oz (57.7 kg) 127 lb (57.6 kg) 125 lb 12.8 oz (57.1 kg)    Telemetry    Sinus rhythm- Personally  Reviewed  ECG    Not performed today- Personally Reviewed  Physical Exam   GEN: No acute distress.   Neck: No JVD Cardiac: RRR, no murmurs, rubs, or gallops.  Respiratory: Clear to auscultation bilaterally. GI: Soft, nontender, non-distended  MS: No edema; No deformity. Neuro:  Nonfocal  Psych: Normal affect   Labs    Chemistry Recent Labs  Lab 07/11/18 0250  07/15/18 0411 07/16/18 0646 07/17/18 0343  NA 134*   < > 140 141 136  K 3.6   < > 4.3 4.2 3.9  CL 101   < > 112* 112* 109  CO2 20*   < > 21* 20* 21*  GLUCOSE 106*   < > 100* 95 86  BUN 58*   < > 16 13 14   CREATININE 2.77*   < > 1.61* 1.54* 1.35*  CALCIUM 8.0*   < > 8.3* 8.3* 8.3*  PROT 5.7*  --   --   --   --   ALBUMIN 3.0*  --   --   --   --   AST 21  --   --   --   --   ALT 14  --   --   --   --   ALKPHOS 49  --   --   --   --  BILITOT 0.5  --   --   --   --   GFRNONAA 15*   < > 29* 31* 36*  GFRAA 18*   < > 34* 36* 42*  ANIONGAP 13   < > 7 9 6    < > = values in this interval not displayed.     Hematology Recent Labs  Lab 07/15/18 0411 07/16/18 0646 07/17/18 0343  WBC 5.8 6.1 7.1  RBC 2.99* 3.14* 2.91*  HGB 8.1* 8.4* 7.8*  HCT 25.5* 27.1* 24.7*  MCV 85.3 86.3 84.9  MCH 27.1 26.8 26.8  MCHC 31.8 31.0 31.6  RDW 15.9* 16.0* 16.1*  PLT 280 257 272    Cardiac Enzymes Recent Labs  Lab 07/11/18 0250 07/11/18 0543 07/11/18 0842  TROPONINI 4.49* 4.36* 3.99*   No results for input(s): TROPIPOC in the last 168 hours.   BNPNo results for input(s): BNP, PROBNP in the last 168 hours.   DDimer No results for input(s): DDIMER in the last 168 hours.   Radiology    No results found.  Cardiac Studies   2D echocardiogram (07/11/2018)  Study Conclusions  - Left ventricle: The cavity size was normal. There was moderate   concentric hypertrophy. Systolic function was normal. The   estimated ejection fraction was in the range of 55% to 60%. Wall   motion was normal; there were no regional wall  motion   abnormalities. Doppler parameters are consistent with abnormal   left ventricular relaxation (grade 1 diastolic dysfunction).   Doppler parameters are consistent with high ventricular filling   pressure. - Aortic valve: Transvalvular velocity was within the normal range.   There was no stenosis. There was trivial regurgitation. - Mitral valve: Calcified annulus. Transvalvular velocity was   within the normal range. There was no evidence for stenosis.   There was mild regurgitation. - Right ventricle: The cavity size was normal. Wall thickness was   normal. Systolic function was normal. - Atrial septum: No defect or patent foramen ovale was identified   by color flow Doppler. - Tricuspid valve: There was trivial regurgitation. - Pulmonary arteries: Systolic pressure was within the normal   range. PA peak pressure: 29 mm Hg (S).  Cardiac catheterization (07/13/2018)   Conclusion     Dist LM lesion is 25% stenosed.  Ost LAD lesion is 50% stenosed.  Prox LAD lesion is 75% stenosed.  Ost 1st Diag lesion is 99% stenosed.  Prox RCA lesion is 99% stenosed.  There is no aortic valve stenosis.  LV end diastolic pressure is mildly elevated.  Tortuosity in the right subclavian makes torquing catheter for the RCA somewhat difficult.   COntinue aspirin.  Get CVTS consult for severe multivessel calcific CAD.    Coronary intervention procedure (07/16/18)  Conclusion     Dist LM lesion is 25% stenosed.  Ost LAD lesion is 50% stenosed.  Prox LAD lesion is 75% stenosed.  A drug-eluting stent was successfully placed using a STENT SIERRA 2.75 X 18 MM.  Ost 1st Diag lesion is 99% stenosed. THis was jailed by the stent.  Post intervention, there is a 0% residual stenosis.  Prox RCA lesion is 99% stenosed.  A drug-eluting stent was successfully placed after orbital atherectomy, using a STENT SIERRA 3.00 X 18 MM.  Post intervention, there is a 0% residual  stenosis.  LV end diastolic pressure is normal. LVEDP 10 mm Hg.  There is no aortic valve stenosis.     Recommend uninterrupted dual antiplatelet therapy with  Aspirin 81mg  daily and Clopidogrel 75mg  daily for a minimum of 12 months (ACS - Class I recommendation).   Continue aggressive secondary prevention.      Patient Profile     83 y.o. female admitted on 07/11/2018 with a non-STEMI and a troponin that peaked in the 4 range.  She does have a history of PAD status post left carotid endarterectomy remotely, bilateral renal artery disease with an occluded right renal artery status post left renal artery stenting.  She has bilateral iliac disease left greater than right and occluded SFAs bilaterally.  She underwent staged proximal LAD drug-eluting stenting and RCA orbital atherectomy and drug-eluting stenting with excellent angiographic and clinical result.  Assessment & Plan    1: Non-STEMI- patient admitted with chest pressure and positive enzymes with a troponin in the 4 range.  Cardiac catheterization performed on 07/13/2018 revealed two-vessel disease with 75% calcified proximal LAD and 95 to 90% calcified proximal dominant RCA.  She does have chronic renal insufficiency and was hydrated over the weekend with creatinines in the 1. 5 range which have fallen to 1.35 today.  She underwent proximal LAD PCI and drug-eluting stenting as well as proximal RCA orbital atherectomy, PCI drug-eluting stenting yesterday by Dr. Irish Lack with an excellent result.  2: Chronic renal insufficiency- serum creatinine 1.54 down from 1.54 with hydration.  She does have a solitary kidney with a known occluded right renal artery status post left renal artery stenting by myself remotely.  3: Essential hypertension- her blood pressure is elevated this morning.  Can titrate the amlodipine and carvedilol.  Ms. Juanito Doom is postop day 1 LAD and RCA percutaneous intervention.  She has not ambulated yet and does complain  of some right hip pain although her groin appears unremarkable.  I would recommend addressing her hypertension today, ambulation with cardiac rehab most likely discharge home tomorrow.  [facility] will sign off.   Medication Recommendations: Adjust amlodipine and carvedilol for optimal blood pressure control Other recommendations (labs, testing, etc): 12 months of uninterrupted dual antiplatelet therapy Follow up as an outpatient: Dr. Quay Burow within the next 2 to 3 weeks.   For questions or updates, please contact [place] Please consult [email or web address] for contact info under Cardiology/STEMI.      Signed, Quay Burow, MD  07/17/2018, 7:37 AM

## 2018-07-17 NOTE — Progress Notes (Signed)
CARDIAC REHAB PHASE I   PRE:  Rate/Rhythm: 27 SR  BP:  Sitting: 156/30      SaO2: 100 RA  MODE:  Ambulation: 10 ft in room   POST:  Rate/Rhythm: 77 SR  BP:  Sitting: 147/26    SaO2: 100 RA   Pt ambulated 25ft in room when she c/o her head  "feeling funny". Pt assisted to BR and back to bed. VSS. RN made aware. Pt doesn't feel like she can tolerate education at this time. Pt not for d/c today. Will continue to follow.  [id number] Gabrielle Falco, RN BSN 07/17/2018 8:43 AM

## 2018-07-18 ENCOUNTER — Inpatient Hospital Stay (HOSPITAL_COMMUNITY): Payer: Medicare Other

## 2018-07-18 DIAGNOSIS — R531 Weakness: Secondary | ICD-10-CM

## 2018-07-18 LAB — BASIC METABOLIC PANEL
Anion gap: 8 (ref 5–15)
BUN: 14 mg/dL (ref 8–23)
CO2: 21 mmol/L — ABNORMAL LOW (ref 22–32)
Calcium: 8.4 mg/dL — ABNORMAL LOW (ref 8.9–10.3)
Chloride: 109 mmol/L (ref 98–111)
Creatinine, Ser: 1.48 mg/dL — ABNORMAL HIGH (ref 0.44–1.00)
GFR calc Af Amer: 37 mL/min — ABNORMAL LOW (ref >60)
GFR calc non Af Amer: 32 mL/min — ABNORMAL LOW (ref >60)
Glucose, Bld: 89 mg/dL (ref 70–99)
Potassium: 3.6 mmol/L (ref 3.5–5.1)
Sodium: 138 mmol/L (ref 135–145)

## 2018-07-18 LAB — GLUCOSE, CAPILLARY
Glucose-Capillary: 88 mg/dL (ref 70–99)
Glucose-Capillary: 82 mg/dL (ref 70–99)
Glucose-Capillary: 133 mg/dL — ABNORMAL HIGH (ref 70–99)
Glucose-Capillary: 211 mg/dL — ABNORMAL HIGH (ref 70–99)
Glucose-Capillary: 145 mg/dL — ABNORMAL HIGH (ref 70–99)

## 2018-07-18 MED ORDER — ENSURE ENLIVE PO LIQD: 237.0000 mL | Freq: Two times a day (BID) | ORAL | Status: DC

## 2018-07-18 MED ORDER — ENSURE ENLIVE PO LIQD
237.0000 mL | Freq: Three times a day (TID) | ORAL | Status: DC
  Administered 2018-07-18 – 2018-07-19 (×4): 237 mL via ORAL

## 2018-07-18 MED ORDER — ADULT MULTIVITAMIN W/MINERALS CH
1.0000 | ORAL_TABLET | Freq: Every day | ORAL | Status: DC
  Administered 2018-07-18 – 2018-07-19 (×2): 1 via ORAL
  Filled 2018-07-18 (×2): qty 1

## 2018-07-18 NOTE — Progress Notes (Signed)
Patient sleeping during shift report.      

## 2018-07-18 NOTE — Progress Notes (Signed)
Physical Therapy Treatment Patient Details Name: Gabrielle [name] MRN: [id number] DOB: 08/16/1935 Today's Date: 07/18/2018    History of Present Illness Pt is an 83 y/o female admitted secondary to chest pressure. Found to have an NSTEMI. Pt is s/p L heart cath and PCI placement. PMH includes DM, CKD, CAD, HTN, CVA, dCHF, and renal cell carcinoma s/p nephrectomy.     PT Comments    Pt was seen for evaluation of mobility with increased tolerance for gait today.  Pt is also performing bed ex's to loosen discomfort of R thigh from old spine symptoms.  Follow acutely for progression of mobility, and transition to SNF when medically ready.   Follow Up Recommendations  SNF;Supervision/Assistance - 24 hour     Equipment Recommendations  None recommended by PT    Recommendations for Other Services OT consult     Precautions / Restrictions Precautions Precautions: Fall;Other (comment)(pain on lateral R leg) Precaution Comments: Watch BP Restrictions Weight Bearing Restrictions: No    Mobility  Bed Mobility Overal bed mobility: Needs Assistance Bed Mobility: Supine to Sit;Sit to Supine     Supine to sit: Supervision;HOB elevated Sit to supine: Supervision   General bed mobility comments: increased time and effort, to/from R side of bed  Transfers Overall transfer level: Needs assistance Equipment used: 1 person hand held assist;Rolling walker (2 wheeled) Transfers: Sit to/from Stand Sit to Stand: Min guard         General transfer comment: to avoid unsteady balance upon initial standing with R leg pain  Ambulation/Gait Ambulation/Gait assistance: Min guard Gait Distance (Feet): 40 Feet Assistive device: Rolling walker (2 wheeled);1 person hand held assist Gait Pattern/deviations: Step-to pattern;Step-through pattern;Decreased stride length;Wide base of support;Trunk flexed Gait velocity: reduced Gait velocity interpretation: <1.8 ft/sec, indicate of risk for recurrent  falls General Gait Details: turns are slow   Chief Strategy Officer    Modified Rankin (Stroke Patients Only)       Balance Overall balance assessment: Needs assistance Sitting-balance support: Feet supported Sitting balance-Leahy Scale: Good     Standing balance support: Bilateral upper extremity supported;During functional activity Standing balance-Leahy Scale: Fair                              Cognition Arousal/Alertness: Awake/alert Behavior During Therapy: WFL for tasks assessed/performed Overall Cognitive Status: Within Functional Limits for tasks assessed                                        Exercises General Exercises - Lower Extremity Ankle Circles/Pumps: AROM;Both;5 reps Quad Sets: AROM;Both;5 reps Heel Slides: AROM;Both;10 reps Hip ABduction/ADduction: AROM;Both;10 reps    General Comments General comments (skin integrity, edema, etc.): pt had controlled pulses and O2 sats with effort      Pertinent Vitals/Pain Pain Assessment: Faces Pain Score: 7  Pain Location: R hip  Pain Descriptors / Indicators: Grimacing;Guarding Pain Intervention(s): Limited activity within patient's tolerance;Repositioned;Premedicated before session;Monitored during session    Home Living                      Prior Function            PT Goals (current goals can now be found in the care plan section) Acute Rehab PT Goals Patient  Stated Goal: get home and stronger Progress towards PT goals: Progressing toward goals    Frequency    Min 3X/week      PT Plan Current plan remains appropriate    Co-evaluation              AM-PAC PT "6 Clicks" Daily Activity  Outcome Measure  Difficulty turning over in bed (including adjusting bedclothes, sheets and blankets)?: A Little Difficulty moving from lying on back to sitting on the side of the bed? : Unable Difficulty sitting down on and standing up from  a chair with arms (e.g., wheelchair, bedside commode, etc,.)?: Unable Help needed moving to and from a bed to chair (including a wheelchair)?: A Little Help needed walking in hospital room?: A Little Help needed climbing 3-5 steps with a railing? : A Lot 6 Click Score: 13    End of Session Equipment Utilized During Treatment: Gait belt Activity Tolerance: Patient tolerated treatment well;Patient limited by pain Patient left: in bed;with call bell/phone within reach;with family/visitor present Nurse Communication: Mobility status PT Visit Diagnosis: Unsteadiness on feet (R26.81);Muscle weakness (generalized) (M62.81)     Time: 5009-3818 PT Time Calculation (min) (ACUTE ONLY): 25 min  Charges:  $Gait Training: 8-22 mins $Therapeutic Exercise: 8-22 mins                    G Codes:  Functional Assessment Tool Used: AM-PAC 6 Clicks Basic Mobility    Ramond Dial 07/18/2018, 4:05 PM   Mee Hives, PT MS Acute Rehab Dept. Number: [place] [facility] [place]

## 2018-07-18 NOTE — Telephone Encounter (Signed)
Pt currently admitted. TOC call for 7/25

## 2018-07-18 NOTE — Progress Notes (Signed)
PROGRESS NOTE  Attending MD note  Patient was seen, examined,treatment plan was discussed with the PA-S.  I have personally reviewed the clinical findings, lab, imaging studies and management of this patient in detail. I agree with the documentation, as recorded by the PA-S  Patient is 83 yo F with history of diastolic CHF, renal cell carcinoma status post nephrectomy, chronic kidney disease stage IV, hypertension, diabetes, who was admitted to the hospital with chest pain.  Cardiology was consulted, her troponin was elevated and was diagnosed with an NSTEMI, now status post cardiac catheterization with drug-eluting stents x2 on 7/22.  BP (!) 130/45 (BP Location: Left Arm)   Pulse (!) 59   Temp 98.3 F (36.8 C) (Oral)   Resp 18   Ht 5\' 1"  (1.549 m)   Wt 56.1 kg (123 lb 11.2 oz)   SpO2 97%   BMI 23.37 kg/m  On Exam: Gen. exam: Awake, alert, not in any distress Chest: Good air entry bilaterally, no rhonchi or rales CVS: S1-S2 regular, no murmurs Abdomen: Soft, nontender and nondistended Neurology: Non-focal Skin: No rash or lesions  Plan  NSTEMI -Cardiology consulted, currently on Plavix, aspirin, status post PCI on 7/22 with DES to LAD and RCA -She is chest pain-free  Right-sided weakness/facial numbness -Patient has been complaining of perioral numbness since yesterday, also has been having right-sided weakness but she is unsure as to how long that is been going on, sometimes tells me for days sometimes a long time -Obtain MRI to rule out CVA  Acute kidney injury on chronic kidney disease stage IV/solitary kidney -Renal function stable  Hypertension -blood pressure controlled, continue current regimen with Coreg and Norvasc  Type 2 diabetes mellitus -CBGs stable  Iron deficiency anemia -Got IV iron 7/20, will need iron on discharge  Severe deconditioning and calorie protein malnutrition -SNF on discharge  Rest as below  Devine Dant M. Cruzita Lederer, MD [facility] [phone]  If 7PM-7AM, please contact night-coverage [email or web address] Password [email or web address]    Gabrielle [name]  AST:[id number] DOB: 01/17/1935 DOA: 07/11/2018 PCP: Forrest Moron, MD   Brief Narrative:  Gabrielle [name] is a 83 y.o. female with a past medical history significant for diastolic heart failure EF 65-70%, renal cell carcinoma s/p nephrectomy, CKD stage IV, hypertension, Type 2 Diabetes Mellitus and a history of a CVA. She presented to the emergency department [facility]) on 7/17 complaining of a 3 day history of nausea, vomiting, decreased PO intake and 1 day history of diarrhea. She also complained of moderate non-radiating chest pain that lasted for about 1 hour earlier that day with associated dyspnea. In the emergency department, she was found to have a troponin of 4.49, BNP 2620, sCr 2.6. EKG showed LVH with new inferior alteral ST depressions. CXR and head CT showed no acute abnormalities. Heparin drip was started and she was transferred to [facility] and admitted with the working diagnosis of NSTEMI complicated with AKI on CKD stage IV.   Subjective: Patient continues with bilateral lower extremity weakness. She denies any further CP, SOB, nausea, vomiting, fevers or chills. She reports that her appetite is improving.  She later complained of peri-oral numbness/tingling x 1 day. She also has had some bleeding from lip. Complains of right sided weakness - she cannot elaborate as to whether this is a new or chronic issue.   Assessment & Plan:  Non-ST elevation myocardial infarction (inferior lateral ST depression)/ severe twovessel disease(LAD and RCA) s/p PCI - She was started on  medical therapy of Plavix, aspirin, atorvastatin and carvedilol.  - Echo: LVEF 55-60% with no wall motion abnormalities. Cardiac catheterization showed multivessel disease. CT surgery indicated she is not a good candidate for a CABG. She underwent coronary angiogram 7/22 with stent placement of LAD  and RCA.  - Patient will continue dual antiplatelet therapy with Aspirin and Plavix. Continue atorvastatin and carvedilol.  - Continue to hold ACEI due to risk of worsening renal function. - lip numbness/tingling over the past day with right sided weakness - MRI brain ordered to rule out ischemia. - Follow-up with cardiology out patient.  Pre-renal AKI on CKD stage IV s/p nephrectomy - Renal function has been stable with sCr at 1.48 today. Renal US snows no acute abnormality in the left kidney. - Avoid nephrotoxic medications. Follow with daily BMP  Hypertension - Continue carvedilol and amlodipine - Blood pressure have remained stable at ~130/45  Type 2 Diabetes Mellitus with hypoglycemia - Patient's oral intake is improving. BGs are improving from 80s to 100s. - Continue to hold on insulin therapy and IV dextrose to prevent volume overload - Encouraged good oral intake  - Nutrition consult  Iron deficiency anemia s/p IV Iron 7/20 - Transferrin 180, Iron 22, TIBC 252, Ferritin 1 - Follow iron panel as outpatient  Severe deconditioning and calorie protein malnutrition - Nutrition consult. PT and OT recommend SNF at discharge.    DVT prophylaxis: Heparin Code Status: FULL Family Communication: Discussed with patient, no family at bedside  Disposition Plan: Pending MRI. If normal, will be discharged to SNF  Consultants:   Cardiology   Procedures:   Cardiac catheterization  Objective: Vitals:   07/18/18 0422 07/18/18 0427 07/18/18 0827 07/18/18 1007  BP:  (!) 135/41 (!) 130/45 (!) 132/47  Pulse:  68 70   Resp:  18    Temp:  98.1 F (36.7 C)    TempSrc:  Oral    SpO2:  99%    Weight: 56.1 kg (123 lb 11.2 oz)     Height:        Intake/Output Summary (Last 24 hours) at 07/18/2018 1211 Last data filed at 07/18/2018 1018 Gross per 24 hour  Intake 865 ml  Output 1800 ml  Net -935 ml   Filed Weights   07/16/18 0506 07/17/18 0649 07/18/18 0422  Weight: 57.6 kg  (127 lb) 57.1 kg (125 lb 12.8 oz) 56.1 kg (123 lb 11.2 oz)    Examination: General appearance: deconditioned adult female, awake and alert. NAD.   HEENT: Anicteric, conjunctiva pink, lids and lashes normal. No nasal deformity, discharge, epistaxis.  Skin: Warm and dry. No suspicious rashes or lesions. Cardiac: RRR, nl S1-S2, no murmurs appreciated. No LE edema. No cyanosis. Distal pulses are intact bilaterally. Respiratory: Normal respiratory rate and rhythm. CTAB without wheezes, crackles or rales. Abdomen: Abdomen soft and non-distended. No TTP. No ascites, masses, hepatosplenomegaly appreciated. Positive bowel sounds throughout. Neuro: AOx3. Moves all extremities. Speech fluent. Decreased strength on RUE and RLE.  Data Reviewed: I have personally reviewed following labs and imaging studies:  CBC: Recent Labs  Lab 07/12/18 0349 07/13/18 0513 07/14/18 0444 07/15/18 0411 07/16/18 0646 07/17/18 0343  WBC 5.4 4.2 7.7 5.8 6.1 7.1  NEUTROABS 3.5  --   --   --   --   --   HGB 8.9* 8.5* 8.3* 8.1* 8.4* 7.8*  HCT 28.7* 27.0* 26.2* 25.5* 27.1* 24.7*  MCV 84.7 85.2 84.2 85.3 86.3 84.9  PLT 329 277 294 280 257 272  Basic Metabolic Panel: Recent Labs  Lab 07/14/18 0444 07/15/18 0411 07/16/18 0646 07/17/18 0343 07/18/18 0412  NA 138 140 141 136 138  K 4.3 4.3 4.2 3.9 3.6  CL 113* 112* 112* 109 109  CO2 20* 21* 20* 21* 21*  GLUCOSE 96 100* 95 86 89  BUN 24* 16 13 14 14   CREATININE 1.62* 1.61* 1.54* 1.35* 1.48*  CALCIUM 8.0* 8.3* 8.3* 8.3* 8.4*   GFR: Estimated Creatinine Clearance: 22.9 mL/min (A) (by C-G formula based on SCr of 1.48 mg/dL (H)).  Coagulation Profile: Recent Labs  Lab 07/14/18 0444  INR 1.11   CBG: Recent Labs  Lab 07/17/18 0705 07/17/18 1136 07/17/18 1648 07/17/18 2130 07/18/18 0732  GLUCAP 74 88 93 177* 82   Urine analysis:    Component Value Date/Time   COLORURINE YELLOW 10/13/2017 1445   APPEARANCEUR HAZY (A) 10/13/2017 1445   LABSPEC  1.019 10/13/2017 1445   PHURINE 5.0 10/13/2017 1445   GLUCOSEU 50 (A) 10/13/2017 1445   HGBUR NEGATIVE 10/13/2017 1445   BILIRUBINUR negative 10/23/2017 1110   BILIRUBINUR neg 04/10/2015 1406   KETONESUR negative 10/23/2017 1110   KETONESUR NEGATIVE 10/13/2017 1445   PROTEINUR =100 (A) 10/23/2017 1110   PROTEINUR 100 (A) 10/13/2017 1445   UROBILINOGEN 0.2 10/23/2017 1110   UROBILINOGEN 0.2 12/23/2014 1736   NITRITE Negative 10/23/2017 1110   NITRITE NEGATIVE 10/13/2017 1445   LEUKOCYTESUR Trace (A) 10/23/2017 1110   Recent Results (from the past 240 hour(s))  MRSA PCR Screening     Status: None   Collection Time: 07/11/18  1:39 AM  Result Value Ref Range Status   MRSA by PCR NEGATIVE NEGATIVE Final    Comment:        The GeneXpert MRSA Assay (FDA approved for NASAL specimens only), is one component of a comprehensive MRSA colonization surveillance program. It is not intended to diagnose MRSA infection nor to guide or monitor treatment for MRSA infections. Performed at [facility], [place] [address], [place], [place] [address]   Surgical pcr screen     Status: None   Collection Time: 07/14/18  5:03 AM  Result Value Ref Range Status   MRSA, PCR NEGATIVE NEGATIVE Final   Staphylococcus aureus NEGATIVE NEGATIVE Final    Comment: (NOTE) The Xpert SA Assay (FDA approved for NASAL specimens in patients 22 years of age and older), is one component of a comprehensive surveillance program. It is not intended to diagnose infection nor to guide or monitor treatment. Performed at [facility], [place] [address] [place], [place] [address]     Radiology Studies: No results found.  Scheduled Meds: . amLODipine  5 mg Oral Daily  . aspirin EC  81 mg Oral Daily  . atorvastatin  40 mg Oral q1800  . carvedilol  3.125 mg Oral BID WC  . clopidogrel  75 mg Oral Daily  . sodium chloride flush  3 mL Intravenous Q12H  . sodium chloride flush  3 mL Intravenous Q12H   Continuous  Infusions: . sodium chloride    . sodium chloride      LOS: 7 days   Samantha Couillard, PA-S [facility] 07/18/2018, 12:11 PM   [email or web address] Password [email or web address] If 7PM-7AM, please contact night-coverage

## 2018-07-18 NOTE — Progress Notes (Signed)
Pt reports the lower half of her face. No change since reporting to staff yesterday.   Sticky note on chart to tell MD.

## 2018-07-18 NOTE — Clinical Social Work Note (Signed)
Clinical Social Work Assessment  Patient Details  Name: Gabrielle [name] MRN: [id number] Date of Birth: 18-Dec-1935  Date of referral:  07/18/18               Reason for consult:  Facility Placement, Discharge Planning                Permission sought to share information with:  Chartered certified accountant granted to share information::  Yes, Verbal Permission Granted  Name::        Agency::  SNF's  Relationship::     Contact Information:     Housing/Transportation Living arrangements for the past 2 months:  Single Family Home Source of Information:  Patient, Medical Team Patient Interpreter Needed:  None Criminal Activity/Legal Involvement Pertinent to Current Situation/Hospitalization:  No - Comment as needed Significant Relationships:  Adult Children Lives with:  Adult Children Do you feel safe going back to the place where you live?  Yes Need for family participation in patient care:  Yes (Comment)  Care giving concerns:  PT recommending SNF once medically stable for discharge.   Social Worker assessment / plan:  CSW met with patient. No supports at bedside. CSW introduced role and explained that PT recommendations would be discussed. Patient agreeable to SNF. No facility preference but does not want [place] because her husband passed away there. No further concerns. CSW encouraged patient to contact CSW as needed. CSW will continue to follow patient for support and facilitate discharge to SNF once medically stable.   Employment status:  Retired Forensic scientist:  Medicare PT Recommendations:  [place] / Referral to community resources:  [place]  Patient/Family's Response to care:  Patient agreeable to SNF placement. Patient's daughter supportive and involved in patient's care. Patient appreciated social work intervention.  Patient/Family's Understanding of and Emotional Response to Diagnosis, Current  Treatment, and Prognosis:  Patient has a good understanding of the reason for admission and her need for rehab prior to returning home. Patient appears happy with hospital care.  Emotional Assessment Appearance:  Appears stated age Attitude/Demeanor/Rapport:  Engaged, Gracious Affect (typically observed):  Accepting, Appropriate, Calm, Pleasant Orientation:  Oriented to Self, Oriented to Place, Oriented to  Time, Oriented to Situation Alcohol / Substance use:  Never Used Psych involvement (Current and /or in the community):  No (Comment)  Discharge Needs  Concerns to be addressed:  Care Coordination Readmission within the last 30 days:  No Current discharge risk:  Dependent with Mobility Barriers to Discharge:  Continued Medical Work up   Candie Chroman, LCSW 07/18/2018, 12:06 PM

## 2018-07-18 NOTE — Progress Notes (Signed)
Patient's evening CBG was checked after consumption of ensure shake.

## 2018-07-18 NOTE — NC FL2 (Signed)
[place] LEVEL OF CARE SCREENING TOOL     IDENTIFICATION  Patient Name: Gabrielle [name] Birthdate: November 06, 1935 Sex: female Admission Date (Current Location): 07/11/2018  [facility] and [place] Number:  Herbalist and Address:  [facility] [place]. [facility], [place] [address] [place], [place] [address]      Provider Number: [id number]  Attending Physician Name and Address:  Caren Griffins, MD  Relative Name and Phone Number:       Current Level of Care: Hospital Recommended Level of Care: [place] Prior Approval Number:    Date Approved/Denied:   PASRR Number: [id number]  Discharge Plan: SNF    Current Diagnoses: Patient Active Problem List   Diagnosis Date Noted  . CAD (coronary artery disease), native coronary artery 07/15/2018  . AKI (acute kidney injury) ([place])   . CKD (chronic kidney disease), stage IV ([place])   . NSTEMI (non-ST elevated myocardial infarction) ([place]) 07/11/2018  . Anemia due to chronic kidney disease 07/11/2018  . Iron deficiency anemia 07/08/2016  . Chronic insomnia 12/10/2015  . Spinal stenosis of lumbar region 05/21/2014  . Carotid artery disease ([place]) 02/26/2014  . PAD (peripheral artery disease) ([place])   . Renal cell cancer ([place])   . Type 2 diabetes mellitus with hypoglycemia without coma ([place])   . Hypertensive heart disease without CHF   . Hyperlipidemia   . History of anemia   . CVA (cerebral vascular accident) ([place])     Orientation RESPIRATION BLADDER Height & Weight     Self, Time, Situation, Place  Normal Continent Weight: 123 lb 11.2 oz (56.1 kg) Height:  5\' 1"  (154.9 cm)  BEHAVIORAL SYMPTOMS/MOOD NEUROLOGICAL BOWEL NUTRITION STATUS  (None) (None) Continent Diet(Heart healthy)  AMBULATORY STATUS COMMUNICATION OF NEEDS Skin   Extensive Assist Verbally Normal                       Personal Care Assistance Level of Assistance  Bathing, Feeding, Dressing Bathing Assistance: Limited  assistance Feeding assistance: Limited assistance Dressing Assistance: Limited assistance     Functional Limitations Info  Sight, Hearing, Speech Sight Info: Adequate Hearing Info: Adequate Speech Info: Adequate    SPECIAL CARE FACTORS FREQUENCY  PT (By licensed PT), OT (By licensed OT)     PT Frequency: 5 x week OT Frequency: 5 x week            Contractures Contractures Info: Not present    Additional Factors Info  Code Status, Allergies Code Status Info: Full Allergies Info: Amlodipine, Codeine, Coreg (Carvedilol), Fentanyl, Lacosamide, Levetiracetam, Metformin And Related, Metoprolol, Morphine And Related, Oxybutynin, Sertraline, Tessalon (Benzonatate), Other           Current Medications (07/18/2018):  This is the current hospital active medication list Current Facility-Administered Medications  Medication Dose Route Frequency Provider Last Rate Last Dose  . 0.9 %  sodium chloride infusion  250 mL Intravenous PRN Larae Grooms S, MD      . 0.9 %  sodium chloride infusion  250 mL Intravenous PRN Jettie Booze, MD      . acetaminophen (TYLENOL) tablet 650 mg  650 mg Oral Q4H PRN Jettie Booze, MD      . acetaminophen (TYLENOL) tablet 650 mg  650 mg Oral Q4H PRN Jettie Booze, MD   650 mg at 07/17/18 1742  . amLODipine (NORVASC) tablet 5 mg  5 mg Oral Daily Jettie Booze, MD   5  mg at 07/18/18 1007  . aspirin EC tablet 81 mg  81 mg Oral Daily Jettie Booze, MD   81 mg at 07/18/18 1007  . atorvastatin (LIPITOR) tablet 40 mg  40 mg Oral q1800 Jettie Booze, MD   40 mg at 07/17/18 1741  . carvedilol (COREG) tablet 3.125 mg  3.125 mg Oral BID WC Jettie Booze, MD   3.125 mg at 07/18/18 0827  . clopidogrel (PLAVIX) tablet 75 mg  75 mg Oral Daily Jettie Booze, MD   75 mg at 07/18/18 1007  . lip balm (CARMEX) ointment   Topical PRN Arrien, Jimmy Picket, MD      . nitroGLYCERIN (NITROSTAT) SL tablet 0.4 mg  0.4 mg  Sublingual Q5 min PRN Jettie Booze, MD      . ondansetron [facility]) injection 4 mg  4 mg Intravenous Q6H PRN Larae Grooms S, MD      . sodium chloride flush (NS) 0.9 % injection 3 mL  3 mL Intravenous Q12H Jettie Booze, MD   3 mL at 07/18/18 1008  . sodium chloride flush (NS) 0.9 % injection 3 mL  3 mL Intravenous PRN Larae Grooms S, MD      . sodium chloride flush (NS) 0.9 % injection 3 mL  3 mL Intravenous Q12H Jettie Booze, MD   3 mL at 07/17/18 2212  . sodium chloride flush (NS) 0.9 % injection 3 mL  3 mL Intravenous PRN Jettie Booze, MD      . zolpidem [facility]) tablet 5 mg  5 mg Oral QHS PRN,MR X 1 Jettie Booze, MD   5 mg at 07/18/18 0217     Discharge Medications: Please see discharge summary for a list of discharge medications.  Relevant Imaging Results:  Relevant Lab Results:   Additional Information SS#: [id number]  Candie Chroman, LCSW

## 2018-07-18 NOTE — Clinical Social Work Note (Addendum)
After discussing SNF options with daughter, first preference is [facility]. They have made a bed offer. CSW left message for admissions coordinator to check on bed availability.  Dayton Scrape, [place]  2:25 pm [facility] has a bed available for patient. She would have to be there by 5:00 today and her MRI has not been completed yet. CSW paged MD to see if discharge is more likely for tomorrow.  Dayton Scrape, [place]

## 2018-07-18 NOTE — Clinical Social Work Placement (Signed)
   CLINICAL SOCIAL WORK PLACEMENT  NOTE  Date:  07/18/2018  Patient Details  Name: Gabrielle [name] MRN: [id number] Date of Birth: 22-Jan-1935  Clinical Social Work is seeking post-discharge placement for this patient at the [place] level of care (*CSW will initial, date and re-position this form in  chart as items are completed):  Yes   Patient/family provided with [place] Work Department's list of facilities offering this level of care within the geographic area requested by the patient (or if unable, by the patient's family).  Yes   Patient/family informed of their freedom to choose among providers that offer the needed level of care, that participate in Medicare, Medicaid or managed care program needed by the patient, have an available bed and are willing to accept the patient.  Yes   Patient/family informed of [facility]'s ownership interest in [facility] and [facility], as well as of the fact that they are under no obligation to receive care at these facilities.  PASRR submitted to EDS on 07/18/18     PASRR number received on 07/18/18     Existing PASRR number confirmed on       FL2 transmitted to all facilities in geographic area requested by pt/family on 07/18/18     FL2 transmitted to all facilities within larger geographic area on       Patient informed that his/her managed care company has contracts with or will negotiate with certain facilities, including the following:            Patient/family informed of bed offers received.  Patient chooses bed at       Physician recommends and patient chooses bed at      Patient to be transferred to   on  .  Patient to be transferred to facility by       Patient family notified on   of transfer.  Name of family member notified:        PHYSICIAN Please sign FL2     Additional Comment:    _______________________________________________ Candie Chroman, LCSW 07/18/2018,  12:08 PM

## 2018-07-18 NOTE — Progress Notes (Addendum)
Patient awaiting MRI of brain wo contrast per Dr Cruzita Lederer d/t c/o odd facial sensations.

## 2018-07-18 NOTE — Progress Notes (Signed)
Pt ambulated to the restroom with use of walker, independently. Observed by RN on standby.

## 2018-07-18 NOTE — Progress Notes (Addendum)
Initial Nutrition Assessment  DOCUMENTATION CODES:   Not applicable  INTERVENTION:   -Ensure Enlive po TID, each supplement provides 350 kcal and 20 grams of protein -MVI with minerals daily  NUTRITION DIAGNOSIS:   Inadequate oral intake related to decreased appetite as evidenced by meal completion < 25%, per patient/family report.  GOAL:   Patient will meet greater than or equal to 90% of their needs  MONITOR:   PO intake, Supplement acceptance, Labs, Weight trends, Skin, I & O's  REASON FOR ASSESSMENT:   Consult Assessment of nutrition requirement/status  ASSESSMENT:   Gabrielle [name] is a 83 y.o. female with medical history significant of diastolic dysfunction last EF 65 - 70% with grade 1 diastolic, RCC s/p nephrectomy, CKD, and CVA, and mild cognitive disorder; who presented with complaints of generalized weakness.  Pt admitted with acute NSTEMI.   7/19- s/p lt heart cath and coronary angiography  Spoke with pt at bedside, who reports a very poor appetite since hospitalization. She shares that she "is not much of a big eater" at baseline, however, has eating very little (mainly bites and sips) throughout hospitalization. Per pt, she is not fond of the food served but has also had minimal desire to eat ("I'll take a bite and just won't want any more- I'll mainly just sips on some juice"). Noted documented meal intake 10% of meals.   PTA pt consumes 3 meals per day- Breakfast: toast and coffee; Lunch: sandwich or soup; Dinner: meat, starch, and vegetable. Per pt, daughter assists with meal preparation at home.   Pt reports UBW is around 120-125#. She denies any weight loss "even though I haven't been eating". She reports weakness during hospitalization ("I now need help to get up").   Discussed with pt importance of good meal and supplement intake to promote healing. Pt has not tried Ensure supplements in the past, however, is willing to try. Pt with poor oral intake and  would benefit from nutrient dense supplement. One Ensure Enlive supplement provides 350 kcals, 20 grams protein, and 44-45 grams of carbohydrate vs one Glucerna shake supplement, which provides 220 kcals, 10 grams of protein, and 26 grams of carbohydrate. Given pt's hx of DM, RD will continue to monitor PO intake, CBGS, and adjust supplement regimen as appropriate.   Last Hgb A1c: 5.6 (07/14/18), which indicates good control. PTA DM medications 5 mg glipizide daily.   Labs reviewed: CBGS: 93-177.   NUTRITION - FOCUSED PHYSICAL EXAM:    Most Recent Value  Orbital Region  No depletion  Upper Arm Region  No depletion  Thoracic and Lumbar Region  No depletion  Buccal Region  No depletion  Temple Region  No depletion  Clavicle Bone Region  No depletion  Clavicle and Acromion Bone Region  No depletion  Scapular Bone Region  No depletion  Dorsal Hand  Mild depletion  Patellar Region  No depletion  Anterior Thigh Region  No depletion  Posterior Calf Region  Mild depletion  Edema (RD Assessment)  None  Hair  Reviewed  Eyes  Reviewed  Mouth  Reviewed  Skin  Reviewed  Nails  Reviewed       Diet Order:   Diet Order           Diet Heart Room service appropriate? Yes; Fluid consistency: Thin  Diet effective now          EDUCATION NEEDS:   Education needs have been addressed  Skin:  Skin Assessment: Reviewed RN Assessment  Last BM:  07/17/18  Height:   Ht Readings from Last 1 Encounters:  07/11/18 5\' 1"  (1.549 m)    Weight:   Wt Readings from Last 1 Encounters:  07/18/18 123 lb 11.2 oz (56.1 kg)    Ideal Body Weight:  47.7 kg  BMI:  Body mass index is 23.37 kg/m.  Estimated Nutritional Needs:   Kcal:  1400-1600  Protein:  60-75 grams  Fluid:  1.4-1.6 L    Iya Hamed A. Jimmye Norman, RD, LDN, CDE Pager: [phone] After hours Pager: [phone]

## 2018-07-18 NOTE — Progress Notes (Signed)
CARDIAC REHAB PHASE I   PRE:  Rate/Rhythm: 76 SR  BP:  Sitting: 137/49      SaO2: 100 RA  MODE:  Ambulation: 50 ft   POST:  Rate/Rhythm: 67 SR  BP:  Sitting: 156/46    SaO2: 100 RA   Pt ambulated 106ft in hallway assist of 1 with gait belt and front wheel walker. Pt with short, very slow gait. C/o tiredness and slight SOB. Pt educated on importance of Plavix, ASA, and NTG. MI booklet reviewed with pt. Discussed restrictions and exercise guidelines. Heart healthy and diabetic diets given. Will send referral for CRP II to [place].  [id number] Rufina Falco, RN BSN 07/18/2018 11:46 AM

## 2018-07-18 NOTE — Progress Notes (Signed)
Pt provided neck strap for tele-box.

## 2018-07-18 NOTE — Progress Notes (Signed)
Requested CSW return to room to speak to patient and her daughter today regarding facilities.

## 2018-07-19 ENCOUNTER — Inpatient Hospital Stay (HOSPITAL_COMMUNITY): Payer: Medicare Other

## 2018-07-19 ENCOUNTER — Ambulatory Visit (HOSPITAL_COMMUNITY): Payer: Self-pay | Admitting: Psychiatry

## 2018-07-19 ENCOUNTER — Ambulatory Visit (HOSPITAL_COMMUNITY): Payer: Medicare Other

## 2018-07-19 DIAGNOSIS — Z111 Encounter for screening for respiratory tuberculosis: Secondary | ICD-10-CM | POA: Diagnosis not present

## 2018-07-19 DIAGNOSIS — R52 Pain, unspecified: Secondary | ICD-10-CM | POA: Diagnosis not present

## 2018-07-19 DIAGNOSIS — Z87442 Personal history of urinary calculi: Secondary | ICD-10-CM | POA: Diagnosis not present

## 2018-07-19 DIAGNOSIS — M1990 Unspecified osteoarthritis, unspecified site: Secondary | ICD-10-CM | POA: Diagnosis present

## 2018-07-19 DIAGNOSIS — R531 Weakness: Secondary | ICD-10-CM | POA: Diagnosis not present

## 2018-07-19 DIAGNOSIS — R1084 Generalized abdominal pain: Secondary | ICD-10-CM | POA: Diagnosis not present

## 2018-07-19 DIAGNOSIS — N184 Chronic kidney disease, stage 4 (severe): Secondary | ICD-10-CM | POA: Diagnosis not present

## 2018-07-19 DIAGNOSIS — Z743 Need for continuous supervision: Secondary | ICD-10-CM | POA: Diagnosis not present

## 2018-07-19 DIAGNOSIS — R110 Nausea: Secondary | ICD-10-CM | POA: Diagnosis not present

## 2018-07-19 DIAGNOSIS — Z9842 Cataract extraction status, left eye: Secondary | ICD-10-CM | POA: Diagnosis not present

## 2018-07-19 DIAGNOSIS — E785 Hyperlipidemia, unspecified: Secondary | ICD-10-CM | POA: Diagnosis not present

## 2018-07-19 DIAGNOSIS — Z9841 Cataract extraction status, right eye: Secondary | ICD-10-CM | POA: Diagnosis not present

## 2018-07-19 DIAGNOSIS — N179 Acute kidney failure, unspecified: Secondary | ICD-10-CM | POA: Diagnosis not present

## 2018-07-19 DIAGNOSIS — J449 Chronic obstructive pulmonary disease, unspecified: Secondary | ICD-10-CM | POA: Diagnosis not present

## 2018-07-19 DIAGNOSIS — R112 Nausea with vomiting, unspecified: Secondary | ICD-10-CM | POA: Diagnosis not present

## 2018-07-19 DIAGNOSIS — Z7984 Long term (current) use of oral hypoglycemic drugs: Secondary | ICD-10-CM | POA: Diagnosis not present

## 2018-07-19 DIAGNOSIS — E1122 Type 2 diabetes mellitus with diabetic chronic kidney disease: Secondary | ICD-10-CM | POA: Diagnosis present

## 2018-07-19 DIAGNOSIS — R0789 Other chest pain: Secondary | ICD-10-CM | POA: Diagnosis not present

## 2018-07-19 DIAGNOSIS — I1310 Hypertensive heart and chronic kidney disease without heart failure, with stage 1 through stage 4 chronic kidney disease, or unspecified chronic kidney disease: Secondary | ICD-10-CM | POA: Diagnosis present

## 2018-07-19 DIAGNOSIS — I2510 Atherosclerotic heart disease of native coronary artery without angina pectoris: Secondary | ICD-10-CM | POA: Diagnosis not present

## 2018-07-19 DIAGNOSIS — Z8673 Personal history of transient ischemic attack (TIA), and cerebral infarction without residual deficits: Secondary | ICD-10-CM | POA: Diagnosis not present

## 2018-07-19 DIAGNOSIS — I10 Essential (primary) hypertension: Secondary | ICD-10-CM | POA: Diagnosis not present

## 2018-07-19 DIAGNOSIS — N183 Chronic kidney disease, stage 3 (moderate): Secondary | ICD-10-CM | POA: Diagnosis not present

## 2018-07-19 DIAGNOSIS — K029 Dental caries, unspecified: Secondary | ICD-10-CM | POA: Diagnosis present

## 2018-07-19 DIAGNOSIS — M160 Bilateral primary osteoarthritis of hip: Secondary | ICD-10-CM | POA: Diagnosis not present

## 2018-07-19 DIAGNOSIS — Z905 Acquired absence of kidney: Secondary | ICD-10-CM | POA: Diagnosis not present

## 2018-07-19 DIAGNOSIS — I214 Non-ST elevation (NSTEMI) myocardial infarction: Secondary | ICD-10-CM

## 2018-07-19 DIAGNOSIS — D649 Anemia, unspecified: Secondary | ICD-10-CM | POA: Diagnosis not present

## 2018-07-19 DIAGNOSIS — M109 Gout, unspecified: Secondary | ICD-10-CM | POA: Diagnosis present

## 2018-07-19 DIAGNOSIS — G4700 Insomnia, unspecified: Secondary | ICD-10-CM | POA: Diagnosis not present

## 2018-07-19 DIAGNOSIS — E1151 Type 2 diabetes mellitus with diabetic peripheral angiopathy without gangrene: Secondary | ICD-10-CM | POA: Diagnosis present

## 2018-07-19 DIAGNOSIS — I119 Hypertensive heart disease without heart failure: Secondary | ICD-10-CM | POA: Diagnosis not present

## 2018-07-19 DIAGNOSIS — Z90710 Acquired absence of both cervix and uterus: Secondary | ICD-10-CM | POA: Diagnosis not present

## 2018-07-19 DIAGNOSIS — Z955 Presence of coronary angioplasty implant and graft: Secondary | ICD-10-CM

## 2018-07-19 DIAGNOSIS — Z7902 Long term (current) use of antithrombotics/antiplatelets: Secondary | ICD-10-CM | POA: Diagnosis not present

## 2018-07-19 DIAGNOSIS — R079 Chest pain, unspecified: Secondary | ICD-10-CM | POA: Diagnosis not present

## 2018-07-19 DIAGNOSIS — E11649 Type 2 diabetes mellitus with hypoglycemia without coma: Secondary | ICD-10-CM | POA: Diagnosis not present

## 2018-07-19 DIAGNOSIS — R270 Ataxia, unspecified: Secondary | ICD-10-CM | POA: Diagnosis not present

## 2018-07-19 DIAGNOSIS — R279 Unspecified lack of coordination: Secondary | ICD-10-CM | POA: Diagnosis not present

## 2018-07-19 DIAGNOSIS — I959 Hypotension, unspecified: Secondary | ICD-10-CM | POA: Diagnosis not present

## 2018-07-19 DIAGNOSIS — K5900 Constipation, unspecified: Secondary | ICD-10-CM | POA: Diagnosis not present

## 2018-07-19 DIAGNOSIS — I200 Unstable angina: Secondary | ICD-10-CM

## 2018-07-19 DIAGNOSIS — Z961 Presence of intraocular lens: Secondary | ICD-10-CM | POA: Diagnosis present

## 2018-07-19 DIAGNOSIS — Z85528 Personal history of other malignant neoplasm of kidney: Secondary | ICD-10-CM | POA: Diagnosis not present

## 2018-07-19 DIAGNOSIS — D631 Anemia in chronic kidney disease: Secondary | ICD-10-CM | POA: Diagnosis not present

## 2018-07-19 LAB — GLUCOSE, CAPILLARY
Glucose-Capillary: 71 mg/dL (ref 70–99)
Glucose-Capillary: 145 mg/dL — ABNORMAL HIGH (ref 70–99)

## 2018-07-19 LAB — SEDIMENTATION RATE: Sed Rate: 40 mm/hr — ABNORMAL HIGH (ref 0–22)

## 2018-07-19 LAB — C-REACTIVE PROTEIN: CRP: <0.8 mg/dL (ref <1.0)

## 2018-07-19 MED ORDER — CLOPIDOGREL BISULFATE 75 MG PO TABS: 75.0000 mg | ORAL_TABLET | Freq: Every day | ORAL | Status: AC

## 2018-07-19 MED ORDER — NITROGLYCERIN 0.4 MG SL SUBL: 0.4000 mg | SUBLINGUAL_TABLET | SUBLINGUAL | 12 refills | Status: AC | PRN

## 2018-07-19 MED ORDER — ATORVASTATIN CALCIUM 40 MG PO TABS: 40.0000 mg | ORAL_TABLET | Freq: Every day | ORAL | Status: AC

## 2018-07-19 MED ORDER — CARVEDILOL 3.125 MG PO TABS: 3.1250 mg | ORAL_TABLET | Freq: Two times a day (BID) | ORAL | Status: AC

## 2018-07-19 MED ORDER — FERROUS GLUCONATE 324 (38 FE) MG PO TABS: 324.0000 mg | ORAL_TABLET | Freq: Every day | ORAL | 3 refills | Status: AC

## 2018-07-19 NOTE — Progress Notes (Signed)
Baltazar Najjar notified last night of MRI results in Epic.

## 2018-07-19 NOTE — Discharge Summary (Addendum)
Physician Discharge Summary  Gabrielle [name] VPX:[id number] DOB: [birth year] DOA: 07/11/2018  PCP: Forrest Moron, MD  Admit date: 07/11/2018 Discharge date: 07/19/2018  Admitted From: home Disposition:  SNF  Recommendations for Outpatient Follow-up:  1. Follow up with PCP in 1-2 weeks 2. Follow-up with neurology as an outpatient in 4 to 6 weeks 3. Follow-up with cardiology as an outpatient in 2 weeks 4. Follow up on ESR and CRP per neurology recommendations 5. Continue dual antiplatelet therapy aspirin and Plavix, patient had drug-eluting stents placed during this hospital stay  Home Health: none Equipment/Devices: none  Discharge Condition: stable CODE STATUS: Full code Diet recommendation: heart healthy  HPI: Per Dr. Aubery [name] is a 83 y.o. female with medical history significant of diastolic dysfunction last EF 65 - 70% with grade 1 diastolic, RCC s/p nephrectomy, CKD, and CVA, and mild cognitive disorder; who presented with complaints of generalized weakness.  Patient had reported not feeling well for several days and had had multiple episodes of nausea with nonbloody emesis for which she was unable to eat anything.  For the last 3 days the air conditioning  where she stays had been broken and it has been extremely warm in the house.  Yesterday morning patient complained of complained of sweating on her forehead, but nowhere else.  She also noted 3-4 episodes of diarrhea, feeling shortness of breath with exertion, and chest tightness/discomfort that started yesterday as well.  Other associated symptoms included chills, urinary frequency, right lower leg cramping, ringing her ears, and generalized malaise.  Denies any cough, wheezing, abdominal pain, leg swelling, or recent sick contacts.  No one in the household had similar symptoms.  Labs revealed WBC 10.9, hemoglobin 10.3, platelets 493, sodium 133, CO2 20, BUN 67, creatinine 2.6, glucose 65, TSH within normal limits,  troponin I 5.54, proBNP 2620. Imaging studies included chest x-ray and CT scan of the brain which showed no acute abnormalities.  EKG did show new ST wave depressions.  Heparin drip was started and the patient was given something to eat to improve blood glucose.  Due to the lack of cardiology consultative services transfer was recommended.  Hospital Course: NSTEMI -Cardiology consulted, currently on Plavix, aspirin, status post PCI on 7/22 with DES to LAD and RCA, she improved well post cardiac catheterization, she is chest pain-free, continue with medications as outlined below. Acute CVA-Patient has been complaining of perioral numbness since 7/23, she underwent an MRI on 7/24 which was positive for acute small right cerebral and right cerebellar nonhemorrhagic infarcts.  Neurology was consulted and evaluated patient.  She already underwent a 2D echo which showed normal EF 55 to 46%, grade 1 diastolic dysfunction.  Hemoglobin A1c was 5.6.  Carotid duplex showed 40-59% stenosis on the right and no significant stenosis on the left.  This needs to be followed up as an outpatient.  She is already on dual antiplatelet therapy as well as statin. Acute kidney injury on chronic kidney disease stage IV/solitary kidney -Renal function stable Hypertension -blood pressure controlled, continue current regimen  Type 2 diabetes mellitus -CBGs stable, continue home medications Iron deficiency anemia -Got IV iron 7/20, will need iron on discharge Right hip pain - XR shows degenerative changes, recommend outpatient follow up Severe deconditioning -SNF on discharge   Discharge Diagnoses:  Principal Problem:   NSTEMI (non-ST elevated myocardial infarction) (Gabrielle [name]) Active Problems:   Type 2 diabetes mellitus with hypoglycemia without coma (Gabrielle [name])   Anemia due to chronic  kidney disease   CKD (chronic kidney disease), stage IV (HCC)   AKI (acute kidney injury) (Gabrielle [name])   CAD (coronary artery disease), native coronary  artery   Chest pain   Status post coronary artery stent placement   Discharge Instructions  Discharge Instructions    AMB Referral to Cardiac Rehabilitation - Phase II   Complete by:  As directed    Diagnosis:   Coronary Stents NSTEMI     Amb Referral to Cardiac Rehabilitation   Complete by:  As directed    Diagnosis:   NSTEMI Coronary Stents       Allergies as of 07/19/2018      Reactions   Amlodipine Itching   Med resumed 05/28/18   Codeine Nausea And Vomiting   Coreg [carvedilol] Nausea And Vomiting   Fentanyl Other (See Comments)   unknown   Lacosamide Other (See Comments)    Other name is, VIMPAT unknown   Levetiracetam Other (See Comments)   Strange feelings in head   Metformin And Related Other (See Comments)   unknown   Metoprolol Other (See Comments)   unknown   Morphine And Related Itching   Oxybutynin Swelling   mouth   Sertraline Nausea Only, Other (See Comments)   Swelling in mouth   Tessalon [benzonatate] Other (See Comments)   unknown   Other Rash   BETA BLOCKER      Medication List    STOP taking these medications   lisinopril 40 MG tablet Commonly known as:  PRINIVIL,ZESTRIL   zolpidem 12.5 MG CR tablet Commonly known as:  AMBIEN CR     TAKE these medications   acetaminophen 500 MG tablet Commonly known as:  TYLENOL Take 1,000 mg by mouth every 6 (six) hours as needed.   amLODipine 5 MG tablet Commonly known as:  NORVASC Take 5 mg by mouth daily.   aspirin 81 MG tablet Take 81 mg by mouth daily.   atorvastatin 40 MG tablet Commonly known as:  LIPITOR Take 1 tablet (40 mg total) by mouth daily at 6 PM.   carvedilol 3.125 MG tablet Commonly known as:  COREG Take 1 tablet (3.125 mg total) by mouth 2 (two) times daily with a meal.   clopidogrel 75 MG tablet Commonly known as:  PLAVIX Take 1 tablet (75 mg total) by mouth daily. Start taking on:  07/20/2018   ferrous gluconate 324 MG tablet Commonly known as:  FERGON Take 1  tablet (324 mg total) by mouth daily with breakfast.   ferrous sulfate 160 (50 Fe) MG Tbcr SR tablet Commonly known as:  SLOW FE Take 1 tablet (160 mg total) by mouth daily.   glipiZIDE 5 MG tablet Commonly known as:  GLUCOTROL TAKE 1/2 TABLET BY MOUTH DAILY BEFORE BREAKFAST   nitroGLYCERIN 0.4 MG SL tablet Commonly known as:  NITROSTAT Place 1 tablet (0.4 mg total) under the tongue every 5 (five) minutes as needed for chest pain.       Contact information for follow-up providers    Barrett, Evelene Croon, PA-C On 07/24/2018.   Specialties:  Cardiology, Radiology Why:  Please arrive 15 minutes early for your 9:00am post hospital cardiology follow-up appointment Contact information: [address] [place] [address] [phone]            Contact information for after-discharge care    Destination    [facility].   Service:  Skilled Nursing Contact information: [address] [name] [phone]  Consultations:  Cardiology  Neurology  Procedures/Studies:  2D echo  Study Conclusions - Left ventricle: The cavity size was normal. There was moderate concentric hypertrophy. Systolic function was normal. The estimated ejection fraction was in the range of 55% to 60%. Wall motion was normal; there were no regional wall motion abnormalities. Doppler parameters are consistent with abnormal left ventricular relaxation (grade 1 diastolic dysfunction). Doppler parameters are consistent with Gabrielle ventricular filling pressure. - Aortic valve: Transvalvular velocity was within the normal range. There was no stenosis. There was trivial regurgitation. - Mitral valve: Calcified annulus. Transvalvular velocity was within the normal range. There was no evidence for stenosis. There was mild regurgitation. - Right ventricle: The cavity size was normal. Wall thickness was normal. Systolic function was normal. -  Atrial septum: No defect or patent foramen ovale was identified by color flow Doppler. - Tricuspid valve: There was trivial regurgitation. - Pulmonary arteries: Systolic pressure was within the normal range. PA peak pressure: 29 mm Hg (S).  Dg Chest 2 View  Result Date: 07/14/2018 CLINICAL DATA:  Chest pain and shortness of breath. EXAM: CHEST - 2 VIEW COMPARISON:  07/10/2017 FINDINGS: The cardiac silhouette, mediastinal and hilar contours are within normal limits and stable. There is tortuosity and dense calcification of the thoracic aorta. The lungs are clear. No pleural effusions. The bony thorax is intact. IMPRESSION: No acute cardiopulmonary findings. Electronically Signed   By: Marijo Sanes M.D.   On: 07/14/2018 09:23   Mr Brain Wo Contrast  Result Date: 07/18/2018 CLINICAL DATA:  Perioral numbness since yesterday, RIGHT-sided weakness. History of renal cell cancer, carotid artery disease, hypertension, hyperlipidemia, seizure. EXAM: MRI HEAD WITHOUT CONTRAST TECHNIQUE: Multiplanar, multiecho pulse sequences of the brain and surrounding structures were obtained without intravenous contrast. COMPARISON:  CT HEAD July 10, 2018 MRI head October 14, 2017 FINDINGS: INTRACRANIAL CONTENTS: Subcentimeter reduced diffusion RIGHT cerebellum, additional subcentimeter foci reduced diffusion RIGHT frontal and RIGHT occipital lobes. Identifiable lesions demonstrate low ADC values. Confluent supratentorial and pontine white matter FLAIR T2 hyperintensities. Prominent basal ganglia and thalami perivascular spaces associated chronic small vessel ischemic changes. No parenchymal brain volume loss for age. LEFT anterior temporal pole encephalomalacia. No hydrocephalus. No midline shift, mass effect or masses. No abnormal extra-axial fluid collections. VASCULAR: Normal major intracranial vascular flow voids present at skull base. SKULL AND UPPER CERVICAL SPINE: No abnormal sellar expansion. No suspicious calvarial bone  marrow signal. Craniocervical junction maintained. SINUSES/ORBITS: Chronic RIGHT sphenoid sinusitis. Included ocular globes and orbital contents are non-suspicious. OTHER: None. IMPRESSION: 1. Multiple acute small RIGHT cerebrum and RIGHT cerebellar nonhemorrhagic infarcts. 2. Severe white matter changes compatible with chronic small vessel ischemic changes/chronic hypertensive encephalopathy. 3. LEFT temporal lobe encephalomalacia suggesting TBI. Electronically Signed   By: Elon Alas M.D.   On: 07/18/2018 19:59   US Renal  Result Date: 07/11/2018 CLINICAL DATA:  Initial evaluation for acute renal injury. Status post right nephrectomy. EXAM: RENAL / URINARY TRACT ULTRASOUND COMPLETE COMPARISON:  None. FINDINGS: Right Kidney: Surgically absent. Left Kidney: Length: 10.4 cm. Echogenicity within normal limits. No hydronephrosis. 1.6 x 1.5 x 1.4 cm cyst present at the upper pole. Additional 1.1 x 0.8 x 1.0 cyst at the upper pole. Bladder: Appears normal for degree of bladder distention. Left jet visualized. IMPRESSION: 1. Left kidney demonstrates no hydronephrosis or other acute abnormality. 2. Left renal cysts as above. 3. Prior right nephrectomy. Electronically Signed   By: Jeannine Boga M.D.   On: 07/11/2018 23:42   Dg Hip  Unilat With Pelvis 2-3 Views Right  Result Date: 07/19/2018 CLINICAL DATA:  Chronic right hip pain. Pain radiates down right leg. EXAM: DG HIP (WITH OR WITHOUT PELVIS) 2-3V RIGHT COMPARISON:  02/07/2018. FINDINGS: Degenerative changes lumbar spine and both hips. No acute bony abnormality identified. No evidence of fracture. Aortoiliac atherosclerotic vascular calcification. IMPRESSION: 1. Degenerative changes lumbar spine and both hips. No acute abnormality. 2.  Aortoiliac atherosclerotic vascular disease. Electronically Signed   By: Marcello Moores  Register   On: 07/19/2018 12:30    Subjective: - no chest pain, shortness of breath, no abdominal pain, nausea or vomiting.   Complains of chronic right hip pain  Discharge Exam: Vitals:   07/19/18 0936 07/19/18 1221  BP: (!) 143/46 (!) 137/38  Pulse: 74 62  Resp: 16 18  Temp:  98.8 F (37.1 C)  SpO2: 99% 100%    General: Pt is alert, awake, not in acute distress Cardiovascular: RRR, S1/S2 +, no rubs, no gallops Respiratory: CTA bilaterally, no wheezing, no rhonchi Abdominal: Soft, NT, ND, bowel sounds + Extremities: no edema, no cyanosis    The results of significant diagnostics from this hospitalization (including imaging, microbiology, ancillary and laboratory) are listed below for reference.     Microbiology: Recent Results (from the past 240 hour(s))  MRSA PCR Screening     Status: None   Collection Time: 07/11/18  1:39 AM  Result Value Ref Range Status   MRSA by PCR NEGATIVE NEGATIVE Final    Comment:        The GeneXpert MRSA Assay (FDA approved for NASAL specimens only), is one component of a comprehensive MRSA colonization surveillance program. It is not intended to diagnose MRSA infection nor to guide or monitor treatment for MRSA infections. Performed at [facility], [place] [address], [place], [place] [address]   Surgical pcr screen     Status: None   Collection Time: 07/14/18  5:03 AM  Result Value Ref Range Status   MRSA, PCR NEGATIVE NEGATIVE Final   Staphylococcus aureus NEGATIVE NEGATIVE Final    Comment: (NOTE) The Xpert SA Assay (FDA approved for NASAL specimens in patients 41 years of age and older), is one component of a comprehensive surveillance program. It is not intended to diagnose infection nor to guide or monitor treatment. Performed at [facility], [place] [address] [place], [place] [address]      Labs: BNP (last 3 results) No results for input(s): BNP in the last 8760 hours. Basic Metabolic Panel: Recent Labs  Lab 07/14/18 0444 07/15/18 0411 07/16/18 0646 07/17/18 0343 07/18/18 0412  NA 138 140 141 136 138  K 4.3 4.3 4.2 3.9 3.6  CL  113* 112* 112* 109 109  CO2 20* 21* 20* 21* 21*  GLUCOSE 96 100* 95 86 89  BUN 24* '16 13 14 14  ' CREATININE 1.62* 1.61* 1.54* 1.35* 1.48*  CALCIUM 8.0* 8.3* 8.3* 8.3* 8.4*   Liver Function Tests: No results for input(s): AST, ALT, ALKPHOS, BILITOT, PROT, ALBUMIN in the last 168 hours. No results for input(s): LIPASE, AMYLASE in the last 168 hours. No results for input(s): AMMONIA in the last 168 hours. CBC: Recent Labs  Lab 07/13/18 0513 07/14/18 0444 07/15/18 0411 07/16/18 0646 07/17/18 0343  WBC 4.2 7.7 5.8 6.1 7.1  HGB 8.5* 8.3* 8.1* 8.4* 7.8*  HCT 27.0* 26.2* 25.5* 27.1* 24.7*  MCV 85.2 84.2 85.3 86.3 84.9  PLT 277 294 280 257 272   Cardiac Enzymes: No results for input(s):  CKTOTAL, CKMB, CKMBINDEX, TROPONINI in the last 168 hours. BNP: Invalid input(s): POCBNP CBG: Recent Labs  Lab 07/18/18 1227 07/18/18 1626 07/18/18 2113 07/19/18 0814 07/19/18 1223  GLUCAP 133* 211* 145* 71 145*   D-Dimer No results for input(s): DDIMER in the last 72 hours. Hgb A1c No results for input(s): HGBA1C in the last 72 hours. Lipid Profile No results for input(s): CHOL, HDL, LDLCALC, TRIG, CHOLHDL, LDLDIRECT in the last 72 hours. Thyroid function studies No results for input(s): TSH, T4TOTAL, T3FREE, THYROIDAB in the last 72 hours.  Invalid input(s): FREET3 Anemia work up No results for input(s): VITAMINB12, FOLATE, FERRITIN, TIBC, IRON, RETICCTPCT in the last 72 hours. Urinalysis    Component Value Date/Time   COLORURINE YELLOW 10/13/2017 1445   APPEARANCEUR HAZY (A) 10/13/2017 1445   LABSPEC 1.019 10/13/2017 1445   PHURINE 5.0 10/13/2017 1445   GLUCOSEU 50 (A) 10/13/2017 1445   HGBUR NEGATIVE 10/13/2017 1445   BILIRUBINUR negative 10/23/2017 1110   BILIRUBINUR neg 04/10/2015 1406   KETONESUR negative 10/23/2017 1110   KETONESUR NEGATIVE 10/13/2017 1445   PROTEINUR =100 (A) 10/23/2017 1110   PROTEINUR 100 (A) 10/13/2017 1445   UROBILINOGEN 0.2 10/23/2017 1110    UROBILINOGEN 0.2 12/23/2014 1736   NITRITE Negative 10/23/2017 1110   NITRITE NEGATIVE 10/13/2017 1445   LEUKOCYTESUR Trace (A) 10/23/2017 1110   Sepsis Labs Invalid input(s): PROCALCITONIN,  WBC,  LACTICIDVEN   Time coordinating discharge: 40 minutes  SIGNED:  Marzetta Board, MD  [facility] 07/19/2018, 2:14 PM Pager [phone]  If 7PM-7AM, please contact night-coverage [email or web address] Password [email or web address]

## 2018-07-19 NOTE — Plan of Care (Signed)
  Problem: Health Behavior/Discharge Planning: Goal: Ability to manage health-related needs will improve Outcome: Progressing   Problem: Activity: Goal: Ability to tolerate increased activity will improve Outcome: Progressing

## 2018-07-19 NOTE — Clinical Social Work Note (Signed)
CSW facilitated patient discharge including contacting patient family (Left voicemail for daughter Candi Leash at [phone]) and facility to confirm patient discharge plans. Clinical information faxed to facility and family agreeable with plan. CSW arranged ambulance transport via [facility] to [facility]. RN to call report prior to discharge [phone]).  CSW will sign off for now as social work intervention is no longer needed. Please consult [place] again if new needs arise.  Dayton Scrape, [place]

## 2018-07-19 NOTE — Progress Notes (Signed)
CARDIAC REHAB PHASE I   Helped pt ambulate from bathroom back to bed. Pt with shakier gait than yesterday. Pt c/o increased numbness in R foot and pain in her R arm. Reviewed MI education with pt. Reinforcing importance of Plavix, ASA and NTG. Referral for CRP II sent to [facility]. Will continue to follow.  [id number] Rufina Falco, RN BSN 07/19/2018 10:07 AM

## 2018-07-19 NOTE — Telephone Encounter (Signed)
Currently admitted 07/19/18

## 2018-07-19 NOTE — Plan of Care (Signed)
  Problem: Education: Goal: Understanding of cardiac disease, CV risk reduction, and recovery process will improve Outcome: Completed/Met

## 2018-07-19 NOTE — Progress Notes (Signed)
07/19/18  1535  Called left a brief report (pts name, DOB, v/s, pain, EMS called for transport already, etc.) voicemail with Claiborne Billings the Multimedia programmer. Left number [phone] for her to call back for more detailed report.

## 2018-07-19 NOTE — Clinical Social Work Placement (Signed)
   CLINICAL SOCIAL WORK PLACEMENT  NOTE  Date:  07/19/2018  Patient Details  Name: Gabrielle [name] MRN: [id number] Date of Birth: Sep 05, 1935  Clinical Social Work is seeking post-discharge placement for this patient at the [place] level of care (*CSW will initial, date and re-position this form in  chart as items are completed):  Yes   Patient/family provided with [facility] Work Department's list of facilities offering this level of care within the geographic area requested by the patient (or if unable, by the patient's family).  Yes   Patient/family informed of their freedom to choose among providers that offer the needed level of care, that participate in Medicare, Medicaid or managed care program needed by the patient, have an available bed and are willing to accept the patient.  Yes   Patient/family informed of [facility]'s ownership interest in [facility] and [facility], as well as of the fact that they are under no obligation to receive care at these facilities.  PASRR submitted to EDS on 07/18/18     PASRR number received on 07/18/18     Existing PASRR number confirmed on       FL2 transmitted to all facilities in geographic area requested by pt/family on 07/18/18     FL2 transmitted to all facilities within larger geographic area on       Patient informed that his/her managed care company has contracts with or will negotiate with certain facilities, including the following:        Yes   Patient/family informed of bed offers received.  Patient chooses bed at [facility]     Physician recommends and patient chooses bed at      Patient to be transferred to [facility] on 07/19/18.  Patient to be transferred to facility by [facility]     Patient family notified on 07/19/18 of transfer.  Name of family member notified:  Candi Leash (Left voicemail)     PHYSICIAN Please prepare prescriptions     Additional Comment:     _______________________________________________ Candie Chroman, LCSW 07/19/2018, 2:32 PM

## 2018-07-19 NOTE — Consult Note (Addendum)
Referring Physician: Caren Griffins, MD    Chief Complaint: Acute ischemic stroke post PCI procedure  HPI: Gabrielle [name] is an 83 y.o. female with a past medical history of diastolic heart failure EF 65-70%, renal cell cancer status post right  nephrectomy, CKD stage IV, hypertension, diabetes mellitus, previous CVA , deconditioning, and mild cognitive disorder who presents the ED with generalized weakness with episodes of diarrhea dyspnea on exertion and chest tightness and discomfort.. On admission patient was noted to have an acute non-STEMI with troponin  0.54 with new ST depressions.   She was started on heparin drip with Plavix aspirin. CT surgery concluded that  patient was not candidate for bypass grafting due to advance comorbidities, chronic kidney disease and deconditioning.  On 07/13/2018, patient underwent a cardiac cath which showed 99% stenosis in the first diagonal and 99% stenosis in the proximal RCA.  One renal function improved, on 07/16/2018, patient underwent PCI with drug-eluting stent to proximal LAD and RCA.  Post op, patient complained of right circumoral numbness as well as her persistent baseline right-sided weakness in her right foot numbness.  Due to the new findings MRI was obtained to rule out CVA.  MRI showed multiple acute small right cerebrum and right cerebellar nonhemorrhagic infarcts as well as severe white matter changes compatible with chronic small vessel ischemic changes.  Neurology was consulted for evaluation  Assessment patient is no acute distress, able to follow commands equally noted to have 4 out of 5 strength on the right side with right foot numbness as well as circumoral numbness but not decreased sensation.   LSN: 07/18/18 - pre op tPA Given: No: out of the time frame window- no LVO NIHSS: 1  Baseline Premorbid modified Rankin scale (mRS): 2   Past Medical History:  Diagnosis Date  . Anemia    hx  . Anxiety   . Arthritis   . Azotemia   .  Carotid artery disease ([place])   . Claudication ([place])   . Coronary artery disease   . CVA (cerebral vascular accident) ([facility]) 2008   LOC DIZZINESS ; "mini stroke" (07/12/2018)  . Depression   . Essential hypertension, benign   . History of gout   . History of kidney stones   . Hyperlipidemia   . Migraine    "years ago; due to vision issues; got glasses & they went away" (07/12/2018)  . Neck pain   . NSTEMI (non-ST elevated myocardial infarction) ([place]) 07/11/2018  . PAD (peripheral artery disease) ([facility])    hx. LCEA, hx Lt renal art. stenting, occl rt renal artery, occluded bil SFAs, moderatie iliac disease,    . Renal cell cancer ([place]) 2001   "left"  . Seizure ([place])    pt does not recall this hx on 07/12/2018  . Type II diabetes mellitus ([place]) 1990    Past Surgical History:  Procedure Laterality Date  . ABDOMINAL HYSTERECTOMY    . BACK SURGERY    . CAROTID ENDARTERECTOMY Left   . CATARACT EXTRACTION W/ INTRAOCULAR LENS  IMPLANT, BILATERAL Bilateral   . CORONARY ATHERECTOMY N/A 07/16/2018   Procedure: CORONARY ATHERECTOMY;  Surgeon: Jettie Booze, MD;  Location: [facility];  Service: Cardiovascular;  Laterality: N/A;  . CORONARY STENT INTERVENTION  07/16/2018  . CORONARY STENT INTERVENTION N/A 07/16/2018   Procedure: CORONARY STENT INTERVENTION;  Surgeon: Jettie Booze, MD;  Location: [facility];  Service: Cardiovascular;  Laterality: N/A;  . INCISION AND DRAINAGE ABSCESS  Right 10/26/2017   Procedure: INCISION AND DRAINAGE HEMATOMA RIGHT SHIN;  Surgeon: Georganna Skeans, MD;  Location: [facility];  Service: General;  Laterality: Right;  . LEFT HEART CATH AND CORONARY ANGIOGRAPHY N/A 07/13/2018   Procedure: LEFT HEART CATH AND CORONARY ANGIOGRAPHY;  Surgeon: Jettie Booze, MD;  Location: [facility];  Service: Cardiovascular;  Laterality: N/A;  . LEFT HEART CATH AND CORONARY ANGIOGRAPHY N/A 07/16/2018   Procedure: LEFT HEART CATH AND CORONARY  ANGIOGRAPHY;  Surgeon: Jettie Booze, MD;  Location: [facility];  Service: Cardiovascular;  Laterality: N/A;  . LUMBAR [place]    . NEPHRECTOMY Right   . NM MYOCAR PERF WALL MOTION  07/12/2011   normal  . PV angiogram  2004   Lt renal artery stent  . TEMPORARY PACEMAKER N/A 07/16/2018   Procedure: TEMPORARY PACEMAKER;  Surgeon: Jettie Booze, MD;  Location: [facility];  Service: Cardiovascular;  Laterality: N/A;  . TONSILLECTOMY    . URETERAL STENT PLACEMENT Left     Family History  Problem Relation Age of Onset  . Cancer Mother   . Heart disease Father    Social History:  reports that she quit smoking about 12 years ago. Her smoking use included cigarettes. She has a 50.00 pack-year smoking history. She has never used smokeless tobacco. She reports that she does not drink alcohol or use drugs.  Allergies:  Allergies  Allergen Reactions  . Amlodipine Itching    Med resumed 05/28/18  . Codeine Nausea And Vomiting  . Coreg [Carvedilol] Nausea And Vomiting  . Fentanyl Other (See Comments)    unknown  . Lacosamide Other (See Comments)     Other name is, VIMPAT unknown  . Levetiracetam Other (See Comments)    Strange feelings in head  . Metformin And Related Other (See Comments)    unknown  . Metoprolol Other (See Comments)    unknown  . Morphine And Related Itching  . Oxybutynin Swelling    mouth  . Sertraline Nausea Only and Other (See Comments)    Swelling in mouth  . Tessalon [Benzonatate] Other (See Comments)    unknown  . Other Rash    BETA BLOCKER    Medications:  . amLODipine  5 mg Oral Daily  . aspirin EC  81 mg Oral Daily  . atorvastatin  40 mg Oral q1800  . carvedilol  3.125 mg Oral BID WC  . clopidogrel  75 mg Oral Daily  . feeding supplement (ENSURE ENLIVE)  237 mL Oral TID BM  . multivitamin with minerals  1 tablet Oral Daily  . sodium chloride flush  3 mL Intravenous Q12H  . sodium chloride flush  3 mL Intravenous Q12H     ROS: General: negative for - chills, fatigue, fever, night sweats, weight gain or weight loss Psychological  negative for - behavioral disorder, Ophthalmic : negative for - blurry vision, double vision, eye pain or loss of vision ENT: negative for - epistaxis, nasal discharge, vertigo, tinnitus Respiratory: negative for - cough, hemoptysis, shortness of breath or wheezing Cardiovascular: negative for - chest pain, dyspnea on exertion, edema or irregular heartbeat Gastrointestinal: negative for - admits to abdominal pain, diarrhea prior to hospitalization Musculoskeletal: negative for - joint swelling but admits to baseline right sided weakness and right foto numbness.  Neurological: as noted in HPI  Physical Examination: Blood pressure (!) 143/46, pulse 74, temperature 98.1 F (36.7 C), temperature source Oral, resp. rate 16, height  5' 1" (1.549 m), weight 56.2 kg (123 lb 14.4 oz), SpO2 99 %. HEENT-  Normocephalic, no lesions, without obvious abnormality.  Normal external eye and conjunctiva.   Cardiovascular- S1-S2 audible, pulses palpable throughout   Lungs-no rhonchi or wheezing noted, no excessive working breathing.  Saturations within normal limits Abdomen- All 4 quadrants palpated and nontender Musculoskeletal-right hip pain with effort related right LE weakness.  Skin-warm and dry,  Neurological Examination Mental Status: Alert, oriented, thought content appropriate.  Speech fluent without evidence of aphasia.  Able to follow 3 step commands without difficulty. Cranial Nerves: II: Visual fields grossly normal,  III,IV, VI: ptosis not present, extra-ocular motions intact bilaterally pupils equal, round, reactive to light  V,VII: smile symmetric, facial light touch sensation normal bilaterally VIII: Hearing intact to voice IX,X: uvula rises symmetrically XI: bilateral shoulder shrug XII: midline tongue extension Motor: Right : Upper extremity   4/5    Left:     Upper  extremity   5/5  Lower extremity   4/5     Lower extremity   5/5 Tone and bulk:normal tone throughout; no atrophy noted Sensory: Pinprick and light touch intact throughout, bilaterally but patient states internally, it feels numb Deep Tendon Reflexes: 2+ and symmetric throughout Plantars: Right: downgoing   Left: downgoing Cerebellar: normal finger-to-nose,  Impaired  heel-to-shin test with right leg due to pain - no ataxia Gait: not tested   Results for orders placed or performed during the hospital encounter of 07/11/18 (from the past 48 hour(s))  Glucose, capillary     Status: None   Collection Time: 07/17/18  4:48 PM  Result Value Ref Range   Glucose-Capillary 93 70 - 99 mg/dL  Glucose, capillary     Status: Abnormal   Collection Time: 07/17/18  9:30 PM  Result Value Ref Range   Glucose-Capillary 177 (H) 70 - 99 mg/dL  Basic metabolic panel     Status: Abnormal   Collection Time: 07/18/18  4:12 AM  Result Value Ref Range   Sodium 138 135 - 145 mmol/L   Potassium 3.6 3.5 - 5.1 mmol/L   Chloride 109 98 - 111 mmol/L   CO2 21 (L) 22 - 32 mmol/L   Glucose, Bld 89 70 - 99 mg/dL   BUN 14 8 - 23 mg/dL   Creatinine, Ser 1.48 (H) 0.44 - 1.00 mg/dL   Calcium 8.4 (L) 8.9 - 10.3 mg/dL   GFR calc non Af Amer 32 (L) >60 mL/min   GFR calc Af Amer 37 (L) >60 mL/min    Comment: (NOTE) The eGFR has been calculated using the CKD EPI equation. This calculation has not been validated in all clinical situations. eGFR's persistently <60 mL/min signify possible Chronic Kidney Disease.    Anion gap 8 5 - 15    Comment: Performed at [facility] [address] [place], [place] [address]  Glucose, capillary     Status: None   Collection Time: 07/18/18  7:32 AM  Result Value Ref Range   Glucose-Capillary 82 70 - 99 mg/dL  Glucose, capillary     Status: Abnormal   Collection Time: 07/18/18 12:27 PM  Result Value Ref Range   Glucose-Capillary 133 (H) 70 - 99 mg/dL  Glucose, capillary      Status: Abnormal   Collection Time: 07/18/18  4:26 PM  Result Value Ref Range   Glucose-Capillary 211 (H) 70 - 99 mg/dL  Glucose, capillary     Status: Abnormal   Collection Time:  07/18/18  9:13 PM  Result Value Ref Range   Glucose-Capillary 145 (H) 70 - 99 mg/dL  Glucose, capillary     Status: None   Collection Time: 07/19/18  8:14 AM  Result Value Ref Range   Glucose-Capillary 71 70 - 99 mg/dL   Comment 1 Notify RN    Comment 2 Document in Chart    Mr Brain Wo Contrast  07/18/2018 IMPRESSION:  1. Multiple acute small RIGHT cerebrum and RIGHT cerebellar nonhemorrhagic infarcts.  2. Severe white matter changes compatible with chronic small vessel ischemic changes/chronic hypertensive encephalopathy.  3. LEFT temporal lobe encephalomalacia suggesting TBI.   Assessment: 83 y.o. female with a past medical history of diastolic heart failure EF 65-70%, renal cell cancer status post right  nephrectomy, CKD stage IV, hypertension, diabetes mellitus, previous CVA , deconditioning, and mild cognitive disorder who was diagnosed with NSTEMI, s/p PCI to RCA and proximal LAD.  Patient complains of numbness post PCI and MRI brain revealed multiple acute small right cerebrum and right cerebellar nonhemorrhagic infarcts as well as severe white matter changes with chronic small vessel ischemic changes secondary to chronic hypertensive encephalopathy.  1. Acute right cerebrum and right cerebellar ischemic stroke postop likely secondary to plaque emboli versus uncontrolled hypertension -Complete stroke work-up with carotid Dopplers which revealed 40 to 59% stenosis in the right ICA  And left ICA 1 to 39% stenosis.  We will continue aspirin and atorvastatin for secondary stroke prevention.  But per cardiology patient status post PCI patient currently on dual antiplatelet aspirin and Plavix.  Continue physical therapy, occupational therapy and speech therapy for deconditioning.  Continue permissive hypertension  the next 24 to 48 hours for continued cerebral perfusion.  Continue to manage aggressively other comorbidities and small vessel disease risk factors such as diabetes mellitus and hypertension.  2.  Non-STEMI status post PCI to LAD and RCA 3.  CKD stage IV patient with history of right renal cancer status post right nephrectomy 4.  Hypertension 5.  Diabetes mellitus  Stroke Risk Factors - diabetes mellitus, hyperlipidemia and hypertension, tobacco abuse hx  Plan: - Carotid dopplers - PT consult, OT consult, Speech consult - Carotid dopplers - Continue Dual antiplatelet per Cards Aspirin 81,g and Plavix 53m - Continue Atorvastatin 468m- Allow for permissive hypertension for the first 24-48h - only treat PRN if SBP >220 mmHg. Blood pressures can be gradually normalized to SBP<140 upon discharge - Risk factor modification - Telemetry monitoring -  Frequent neuro checks   PeLetha CapeNP Neuro-hospitalist Team  NEUROHOSPITALIST ADDENDUM Seen and examined the patient today. I have reviewed the contents of history and physical exam as documented by PA/ARNP/Resident and agree with above documentation.  I have discussed and formulated the above plan as documented. Edits to the note have been made as needed.   Patient has small embolic infarcts likely secondary to cardiac catheterization.    Echocardiogram was performed showed no thrombus ejection fraction 65 to 70%.  Recommend to continue dual antiplatelets.  Carotid Doppler shows right internal carotid arteries are consistent with a 40-59% stenosis. Left internal carotid arteries are consistent with a 1-39% stenosis.   To need dual antiplatelets and statin.  Follow-up with stroke clinic in 2 to 4 weeks.  Patient also complains of right leg sciatica history of spinal stenosis.  She also complains of cervicalgia.  This is been going on for long period of time and can be addressed during outpatient neurology follow-up.   SuKarena Addisonroor  MD  [facility] [phone]   If 7pm to 7am, please call on call as listed on AMION. [phone] 07/19/2018, 11:56 AM

## 2018-07-19 NOTE — Progress Notes (Signed)
*  Preliminary Results* Carotid artery duplex has been completed. Right internal carotid arteries are consistent with a 40-59% stenosis. Left internal carotid arteries are consistent with a 1-39% stenosis.Vertebral arteries are patent with antegrade flow.  07/19/2018 11:05 AM  Gabrielle [name]

## 2018-07-19 NOTE — Progress Notes (Signed)
07/19/18  1625  Ann from [facility] called back to get report. Report was given and She is aware that EMS is here now to transport patient to [facility].

## 2018-07-19 NOTE — Consult Note (Signed)
[name] CM Primary Care Navigator  07/19/2018  Gabrielle [name] [birth year] [id number]   Met with patient at the bedsidetoidentify possible discharge needs.  Patient states having "face numbness"; and per chart, patient presented with generalized weakness, episodes of diarrhea, dyspnea on exertion and chest tightness/ discomfort thathad led to this admission. (NSTEMI- non-ST elevated myocardial infarction; acute right cerebrum and right cerebellar ischemic stroke)   Patient endorsesDr.Jeffrey Carlota Raspberry with [facility] at [facility] as the primary care provider (confirmed with office that patient is an active and established patient of Dr. Carlota Raspberry).   [facility].  Patientstates managing her own medications at homeusing "pillbox"systemfilledonce a week.  Patient's daughter Gabrielle [name]) has been driving and providing transportationtoher doctors' appointments.  Patientstayswith daughter [name]) who serves as her primary caregiver at home.   Anticipated plan for [facility]killed [facility] facility [facility]SNF[facility] for rehabilitation per therapy recommendation.   Patientvoiced understandingto callprimarycare provider'soffice once she returns backhome,for a post discharge follow-upvisitwithin1- 2 weeksor sooner if needs arise.Patient letter (with PCP's contact number) was provided asareminder. Patient had mentioned that she can follow-[facility] with a provider in [name] if needed after discharge (if stays with daughter).  Explained topatient about [facility] CM services available for health management and resourcesat homeand has indicated interest for it. Patient hadverbalizedunderstandingto seekreferral from primary care provider to [facility] care management ifdeemed necessary and appropriatefor anyservicesin the near future-once she isdischarge home.   [facility] care management  information was provided for futureneeds thatpatientmay have.   For additional questions please contact:  [name], BSN, RN-BC [facility] PRIMARY CARE Navigator Cell: [phone]

## 2018-07-19 NOTE — Progress Notes (Signed)
07/19/18  1615  Reviewed discharge instructions with patient. Patient verbalized understanding of discharge instructions. No prescriptions for patient in chart. Copy of discharge instructions sent with patient in the EMS packet for [facility].

## 2018-07-20 ENCOUNTER — Telehealth (HOSPITAL_COMMUNITY): Payer: Self-pay

## 2018-07-20 NOTE — Telephone Encounter (Signed)
Called patient to see if she was interested in participating in the Cardiac Rehab Program. Patient stated not at this time, she is not up to it.  Closed referral

## 2018-07-20 NOTE — Telephone Encounter (Signed)
LM2CB Discharged from [facility] 07/11/2018 - 07/19/2018 (8 days) Follow up APPT Barrett on 07-24-18 at 9am at [facility].

## 2018-07-24 ENCOUNTER — Emergency Department (HOSPITAL_COMMUNITY): Payer: Medicare Other

## 2018-07-24 ENCOUNTER — Inpatient Hospital Stay (HOSPITAL_COMMUNITY)
Admission: EM | Admit: 2018-07-24 | Discharge: 2018-07-26 | DRG: 392 | Disposition: A | Discharge status: Skilled Nursing Facility | Payer: Medicare Other | Attending: Family Medicine | Admitting: Family Medicine

## 2018-07-24 ENCOUNTER — Other Ambulatory Visit: Payer: Self-pay

## 2018-07-24 ENCOUNTER — Encounter: Payer: Self-pay | Admitting: Physician Assistant

## 2018-07-24 ENCOUNTER — Encounter (HOSPITAL_COMMUNITY): Payer: Self-pay

## 2018-07-24 ENCOUNTER — Ambulatory Visit (INDEPENDENT_AMBULATORY_CARE_PROVIDER_SITE_OTHER): Payer: Medicare Other | Admitting: Physician Assistant

## 2018-07-24 VITALS — BP 122/60 | HR 72 | Ht 61.0 in | Wt 124.0 lb

## 2018-07-24 DIAGNOSIS — N184 Chronic kidney disease, stage 4 (severe): Secondary | ICD-10-CM | POA: Diagnosis not present

## 2018-07-24 DIAGNOSIS — Z955 Presence of coronary angioplasty implant and graft: Secondary | ICD-10-CM

## 2018-07-24 DIAGNOSIS — E785 Hyperlipidemia, unspecified: Secondary | ICD-10-CM | POA: Diagnosis present

## 2018-07-24 DIAGNOSIS — N179 Acute kidney failure, unspecified: Secondary | ICD-10-CM | POA: Diagnosis not present

## 2018-07-24 DIAGNOSIS — Z85528 Personal history of other malignant neoplasm of kidney: Secondary | ICD-10-CM

## 2018-07-24 DIAGNOSIS — Z7982 Long term (current) use of aspirin: Secondary | ICD-10-CM

## 2018-07-24 DIAGNOSIS — Z8249 Family history of ischemic heart disease and other diseases of the circulatory system: Secondary | ICD-10-CM

## 2018-07-24 DIAGNOSIS — E11649 Type 2 diabetes mellitus with hypoglycemia without coma: Secondary | ICD-10-CM | POA: Diagnosis present

## 2018-07-24 DIAGNOSIS — Z809 Family history of malignant neoplasm, unspecified: Secondary | ICD-10-CM

## 2018-07-24 DIAGNOSIS — K029 Dental caries, unspecified: Secondary | ICD-10-CM | POA: Diagnosis present

## 2018-07-24 DIAGNOSIS — R112 Nausea with vomiting, unspecified: Principal | ICD-10-CM | POA: Diagnosis present

## 2018-07-24 DIAGNOSIS — R1110 Vomiting, unspecified: Secondary | ICD-10-CM

## 2018-07-24 DIAGNOSIS — Z905 Acquired absence of kidney: Secondary | ICD-10-CM

## 2018-07-24 DIAGNOSIS — I214 Non-ST elevation (NSTEMI) myocardial infarction: Secondary | ICD-10-CM | POA: Diagnosis not present

## 2018-07-24 DIAGNOSIS — Z90710 Acquired absence of both cervix and uterus: Secondary | ICD-10-CM

## 2018-07-24 DIAGNOSIS — Z9842 Cataract extraction status, left eye: Secondary | ICD-10-CM

## 2018-07-24 DIAGNOSIS — Z87442 Personal history of urinary calculi: Secondary | ICD-10-CM

## 2018-07-24 DIAGNOSIS — R0789 Other chest pain: Secondary | ICD-10-CM | POA: Diagnosis not present

## 2018-07-24 DIAGNOSIS — Z87891 Personal history of nicotine dependence: Secondary | ICD-10-CM

## 2018-07-24 DIAGNOSIS — Z7902 Long term (current) use of antithrombotics/antiplatelets: Secondary | ICD-10-CM

## 2018-07-24 DIAGNOSIS — D631 Anemia in chronic kidney disease: Secondary | ICD-10-CM | POA: Diagnosis not present

## 2018-07-24 DIAGNOSIS — M1990 Unspecified osteoarthritis, unspecified site: Secondary | ICD-10-CM | POA: Diagnosis present

## 2018-07-24 DIAGNOSIS — E1151 Type 2 diabetes mellitus with diabetic peripheral angiopathy without gangrene: Secondary | ICD-10-CM | POA: Diagnosis present

## 2018-07-24 DIAGNOSIS — J449 Chronic obstructive pulmonary disease, unspecified: Secondary | ICD-10-CM | POA: Diagnosis not present

## 2018-07-24 DIAGNOSIS — Z8673 Personal history of transient ischemic attack (TIA), and cerebral infarction without residual deficits: Secondary | ICD-10-CM

## 2018-07-24 DIAGNOSIS — I119 Hypertensive heart disease without heart failure: Secondary | ICD-10-CM | POA: Diagnosis present

## 2018-07-24 DIAGNOSIS — I1310 Hypertensive heart and chronic kidney disease without heart failure, with stage 1 through stage 4 chronic kidney disease, or unspecified chronic kidney disease: Secondary | ICD-10-CM | POA: Diagnosis present

## 2018-07-24 DIAGNOSIS — Z888 Allergy status to other drugs, medicaments and biological substances status: Secondary | ICD-10-CM

## 2018-07-24 DIAGNOSIS — M109 Gout, unspecified: Secondary | ICD-10-CM | POA: Diagnosis present

## 2018-07-24 DIAGNOSIS — I10 Essential (primary) hypertension: Secondary | ICD-10-CM | POA: Diagnosis not present

## 2018-07-24 DIAGNOSIS — N189 Chronic kidney disease, unspecified: Secondary | ICD-10-CM

## 2018-07-24 DIAGNOSIS — Z7984 Long term (current) use of oral hypoglycemic drugs: Secondary | ICD-10-CM

## 2018-07-24 DIAGNOSIS — I2510 Atherosclerotic heart disease of native coronary artery without angina pectoris: Secondary | ICD-10-CM | POA: Diagnosis present

## 2018-07-24 DIAGNOSIS — R270 Ataxia, unspecified: Secondary | ICD-10-CM | POA: Diagnosis not present

## 2018-07-24 DIAGNOSIS — Z9841 Cataract extraction status, right eye: Secondary | ICD-10-CM

## 2018-07-24 DIAGNOSIS — Z961 Presence of intraocular lens: Secondary | ICD-10-CM | POA: Diagnosis present

## 2018-07-24 DIAGNOSIS — Z885 Allergy status to narcotic agent status: Secondary | ICD-10-CM

## 2018-07-24 DIAGNOSIS — N183 Chronic kidney disease, stage 3 (moderate): Secondary | ICD-10-CM

## 2018-07-24 DIAGNOSIS — E1122 Type 2 diabetes mellitus with diabetic chronic kidney disease: Secondary | ICD-10-CM | POA: Diagnosis present

## 2018-07-24 LAB — CBC WITH DIFFERENTIAL/PLATELET
Basophils Absolute: 0 10*3/uL (ref 0.0–0.1)
Basophils Relative: 0 %
Eosinophils Absolute: 0.1 10*3/uL (ref 0.0–0.7)
Eosinophils Relative: 1 %
HCT: 28.2 % — ABNORMAL LOW (ref 36.0–46.0)
Hemoglobin: 9 g/dL — ABNORMAL LOW (ref 12.0–15.0)
Lymphocytes Relative: 7 %
Lymphs Abs: 0.7 10*3/uL (ref 0.7–4.0)
MCH: 28.4 pg (ref 26.0–34.0)
MCHC: 31.9 g/dL (ref 30.0–36.0)
MCV: 89 fL (ref 78.0–100.0)
Monocytes Absolute: 0.6 10*3/uL (ref 0.1–1.0)
Monocytes Relative: 6 %
Neutro Abs: 7.9 10*3/uL — ABNORMAL HIGH (ref 1.7–7.7)
Neutrophils Relative %: 86 %
Platelets: 358 10*3/uL (ref 150–400)
RBC: 3.17 MIL/uL — ABNORMAL LOW (ref 3.87–5.11)
RDW: 18.9 % — ABNORMAL HIGH (ref 11.5–15.5)
WBC: 9.2 10*3/uL (ref 4.0–10.5)

## 2018-07-24 LAB — COMPREHENSIVE METABOLIC PANEL
ALT: 20 U/L (ref 0–44)
AST: 17 U/L (ref 15–41)
Albumin: 3.6 g/dL (ref 3.5–5.0)
Alkaline Phosphatase: 70 U/L (ref 38–126)
Anion gap: 8 (ref 5–15)
BUN: 20 mg/dL (ref 8–23)
CO2: 26 mmol/L (ref 22–32)
Calcium: 8.8 mg/dL — ABNORMAL LOW (ref 8.9–10.3)
Chloride: 102 mmol/L (ref 98–111)
Creatinine, Ser: 1.71 mg/dL — ABNORMAL HIGH (ref 0.44–1.00)
GFR calc Af Amer: 31 mL/min — ABNORMAL LOW (ref >60)
GFR calc non Af Amer: 27 mL/min — ABNORMAL LOW (ref >60)
Glucose, Bld: 162 mg/dL — ABNORMAL HIGH (ref 70–99)
Potassium: 4.7 mmol/L (ref 3.5–5.1)
Sodium: 136 mmol/L (ref 135–145)
Total Bilirubin: 0.8 mg/dL (ref 0.3–1.2)
Total Protein: 7 g/dL (ref 6.5–8.1)

## 2018-07-24 LAB — URINALYSIS, ROUTINE W REFLEX MICROSCOPIC
Bacteria, UA: NONE SEEN
Bilirubin Urine: NEGATIVE
Glucose, UA: NEGATIVE mg/dL
Hgb urine dipstick: NEGATIVE
Ketones, ur: NEGATIVE mg/dL
Leukocytes, UA: NEGATIVE
Nitrite: NEGATIVE
Protein, ur: 100 mg/dL — AB
Specific Gravity, Urine: 1.02 (ref 1.005–1.030)
pH: 5 (ref 5.0–8.0)

## 2018-07-24 LAB — ABO/RH: ABO/RH(D): B POS

## 2018-07-24 LAB — LIPASE, BLOOD: Lipase: 39 U/L (ref 11–51)

## 2018-07-24 LAB — PROTIME-INR
INR: 0.99
Prothrombin Time: 13 seconds (ref 11.4–15.2)

## 2018-07-24 LAB — TROPONIN I
Troponin I: 0.03 ng/mL (ref <0.03)
Troponin I: 0.03 ng/mL (ref <0.03)

## 2018-07-24 LAB — TYPE AND SCREEN
ABO/RH(D): B POS
Antibody Screen: NEGATIVE

## 2018-07-24 LAB — GLUCOSE, CAPILLARY
Glucose-Capillary: 106 mg/dL — ABNORMAL HIGH (ref 70–99)
Glucose-Capillary: 104 mg/dL — ABNORMAL HIGH (ref 70–99)

## 2018-07-24 MED ORDER — SODIUM CHLORIDE 0.9 % IV SOLN
INTRAVENOUS | Status: DC
  Administered 2018-07-24 – 2018-07-26 (×4): via INTRAVENOUS

## 2018-07-24 MED ORDER — ACETAMINOPHEN 325 MG PO TABS: 650.0000 mg | ORAL_TABLET | Freq: Four times a day (QID) | ORAL | Status: DC | PRN

## 2018-07-24 MED ORDER — ACETAMINOPHEN 650 MG RE SUPP: 650.0000 mg | Freq: Four times a day (QID) | RECTAL | Status: DC | PRN

## 2018-07-24 MED ORDER — INSULIN ASPART 100 UNIT/ML ~~LOC~~ SOLN
0.0000 [IU] | Freq: Three times a day (TID) | SUBCUTANEOUS | Status: DC
  Administered 2018-07-25: 1 [IU] via SUBCUTANEOUS
  Administered 2018-07-26: 3 [IU] via SUBCUTANEOUS

## 2018-07-24 MED ORDER — CARVEDILOL 3.125 MG PO TABS
3.1250 mg | ORAL_TABLET | Freq: Two times a day (BID) | ORAL | Status: DC
  Administered 2018-07-24 – 2018-07-26 (×4): 3.125 mg via ORAL
  Filled 2018-07-24 (×5): qty 1

## 2018-07-24 MED ORDER — ENOXAPARIN SODIUM 30 MG/0.3ML ~~LOC~~ SOLN
30.0000 mg | SUBCUTANEOUS | Status: DC
  Administered 2018-07-24 – 2018-07-25 (×2): 30 mg via SUBCUTANEOUS
  Filled 2018-07-24 (×2): qty 0.3

## 2018-07-24 MED ORDER — ZOLPIDEM TARTRATE 5 MG PO TABS
5.0000 mg | ORAL_TABLET | Freq: Every evening | ORAL | Status: DC | PRN
  Administered 2018-07-24 – 2018-07-25 (×2): 5 mg via ORAL
  Filled 2018-07-24 (×2): qty 1

## 2018-07-24 MED ORDER — INSULIN ASPART 100 UNIT/ML ~~LOC~~ SOLN: 0.0000 [IU] | Freq: Every day | SUBCUTANEOUS | Status: DC

## 2018-07-24 MED ORDER — ONDANSETRON HCL 4 MG/2ML IJ SOLN
4.0000 mg | Freq: Once | INTRAMUSCULAR | Status: AC
  Administered 2018-07-24: 4 mg via INTRAVENOUS
  Filled 2018-07-24: qty 2

## 2018-07-24 MED ORDER — CLOPIDOGREL BISULFATE 75 MG PO TABS
75.0000 mg | ORAL_TABLET | Freq: Every day | ORAL | Status: DC
  Administered 2018-07-25 – 2018-07-26 (×2): 75 mg via ORAL
  Filled 2018-07-24 (×3): qty 1

## 2018-07-24 MED ORDER — ONDANSETRON HCL 4 MG/2ML IJ SOLN: 4.0000 mg | Freq: Four times a day (QID) | INTRAMUSCULAR | Status: DC | PRN

## 2018-07-24 MED ORDER — SODIUM CHLORIDE 0.9 % IV BOLUS
500.0000 mL | Freq: Once | INTRAVENOUS | Status: AC
  Administered 2018-07-24: 500 mL via INTRAVENOUS

## 2018-07-24 MED ORDER — TRAMADOL HCL 50 MG PO TABS
50.0000 mg | ORAL_TABLET | Freq: Four times a day (QID) | ORAL | Status: DC | PRN
  Administered 2018-07-24 – 2018-07-26 (×4): 50 mg via ORAL
  Filled 2018-07-24 (×4): qty 1

## 2018-07-24 MED ORDER — ASPIRIN EC 81 MG PO TBEC
81.0000 mg | DELAYED_RELEASE_TABLET | Freq: Every day | ORAL | Status: DC
  Administered 2018-07-25 – 2018-07-26 (×2): 81 mg via ORAL
  Filled 2018-07-24 (×3): qty 1

## 2018-07-24 MED ORDER — AMLODIPINE BESYLATE 5 MG PO TABS
5.0000 mg | ORAL_TABLET | Freq: Every day | ORAL | Status: DC
  Administered 2018-07-25 – 2018-07-26 (×2): 5 mg via ORAL
  Filled 2018-07-24 (×3): qty 1

## 2018-07-24 NOTE — Progress Notes (Signed)
Cardiology Office Note   Date:  07/24/2018   ID:  Gabrielle [name], DOB June 05, 1935, MRN [id number]  PCP:  System, Pcp Not In  Cardiologist: Dr. Gwenlyn Found, 07/17/2018 in hospital Rosaria Ferries, PA-C   Chief Complaint  Patient presents with  . office visit    hospital 07/11/18, went to nursing facility     History of Present Illness: Gabrielle [name] is a 83 y.o. female with a history of  PVOD s/p left carotid endaterectomy, renal artery stenting for RAS, bilateral SFA disease, as well as HTN, HLD, DM, h/o CVA, CKD III-IV, RCC s/p nephrectomy and chronic diastolic HF   Admitted 2/45-8/08/9832 with NSTEMI, s/p DES LAD, DES w/ orbital atherectomy RCA, post-proc CVA, iron-def anemia, deconditioning, R-Carotid 40-59%, d/c to [facility] presents for cardiology follow up.   She has not had chest pain since d/c. Breathing is ok, but she is not doing much. Denies LE edema, orthopnea or PND.   No palpitations, no heart skips or racing. Has not been light-headed or dizzy.   She has been very weak, has not been able to increase her activity.  She has not been eating well. Just no appetite. Was not eating well before the MI. No nausea or vomiting. Mild constipation, still w/ regular BMs.   This am, she felt ok upon waking, the staff helped her get washed up. She got nauseated after that and vomited before breakfast. No food today. Not sure if she took her medications, staff contacted and pt had ASA, iron, amlodipine, and glipizide. She vomited again after arriving at the office. She continues to feel nauseated and extremely weak.   Currently, along with the weakness, has abdominal pain. No diarrhea. Has never had this abd pain before. It started just before the nausea. No blood in the vomitus.  Has chronic pain in her RLE, has DJD in her hip. Still has some numbness around her mouth (sx of her CVA) but it is worse today. Has tingling in both hands, that is new.   Also  dealing with pain in the L side of her face/jaw, this started last Friday. She wants to see a dentist, but it has not happened yet.   No fevers or chills.    Past Medical History:  Diagnosis Date  . Anemia    hx  . Anxiety   . Arthritis   . Azotemia   . Carotid artery disease ([place])   . Claudication ([place])   . Coronary artery disease   . CVA (cerebral vascular accident) ([place]) 2008   LOC DIZZINESS ; "mini stroke" (07/12/2018)  . Depression   . Essential hypertension, benign   . History of gout   . History of kidney stones   . Hyperlipidemia   . Migraine    "years ago; due to vision issues; got glasses & they went away" (07/12/2018)  . Neck pain   . NSTEMI (non-ST elevated myocardial infarction) ([place]) 07/11/2018  . PAD (peripheral artery disease) ([facility])    hx. LCEA, hx Lt renal art. stenting, occl rt renal artery, occluded bil SFAs, moderatie iliac disease,    . Renal cell cancer ([place]) 2001   "left"  . Seizure ([place])    pt does not recall this hx on 07/12/2018  . Type II diabetes mellitus ([place]) 1990    Past Surgical History:  Procedure Laterality Date  . ABDOMINAL HYSTERECTOMY    . BACK SURGERY    . CAROTID ENDARTERECTOMY  Left   . CATARACT EXTRACTION W/ INTRAOCULAR LENS  IMPLANT, BILATERAL Bilateral   . CORONARY ATHERECTOMY N/A 07/16/2018   Procedure: CORONARY ATHERECTOMY;  Surgeon: Jettie Booze, MD;  Location: [facility];  Service: Cardiovascular;  Laterality: N/A;  . CORONARY STENT INTERVENTION  07/16/2018  . CORONARY STENT INTERVENTION N/A 07/16/2018   Procedure: CORONARY STENT INTERVENTION;  Surgeon: Jettie Booze, MD;  Location: [facility] [facility];  Service: Cardiovascular;  Laterality: N/A;  . INCISION AND DRAINAGE ABSCESS Right 10/26/2017   Procedure: INCISION AND DRAINAGE HEMATOMA RIGHT SHIN;  Surgeon: Georganna Skeans, MD;  Location: [facility];  Service: General;  Laterality: Right;  . LEFT HEART CATH AND CORONARY ANGIOGRAPHY N/A 07/13/2018   Procedure:  LEFT HEART CATH AND CORONARY ANGIOGRAPHY;  Surgeon: Jettie Booze, MD;  Location: [facility];  Service: Cardiovascular;  Laterality: N/A;  . LEFT HEART CATH AND CORONARY ANGIOGRAPHY N/A 07/16/2018   Procedure: LEFT HEART CATH AND CORONARY ANGIOGRAPHY;  Surgeon: Jettie Booze, MD;  Location: [facility];  Service: Cardiovascular;  Laterality: N/A;  . LUMBAR [place]    . NEPHRECTOMY Right   . NM MYOCAR PERF WALL MOTION  07/12/2011   normal  . PV angiogram  2004   Lt renal artery stent  . TEMPORARY PACEMAKER N/A 07/16/2018   Procedure: TEMPORARY PACEMAKER;  Surgeon: Jettie Booze, MD;  Location: [facility];  Service: Cardiovascular;  Laterality: N/A;  . TONSILLECTOMY    . URETERAL STENT PLACEMENT Left     Current Outpatient Medications  Medication Sig Dispense Refill  . acetaminophen (TYLENOL) 500 MG tablet Take 1,000 mg by mouth every 6 (six) hours as needed.    Marland Kitchen amLODipine (NORVASC) 5 MG tablet Take 5 mg by mouth daily.  3  . aspirin 81 MG tablet Take 81 mg by mouth daily.    Marland Kitchen atorvastatin (LIPITOR) 40 MG tablet Take 1 tablet (40 mg total) by mouth daily at 6 PM.    . carvedilol (COREG) 3.125 MG tablet Take 1 tablet (3.125 mg total) by mouth 2 (two) times daily with a meal.    . clopidogrel (PLAVIX) 75 MG tablet Take 1 tablet (75 mg total) by mouth daily.    . ferrous gluconate (FERGON) 324 MG tablet Take 1 tablet (324 mg total) by mouth daily with breakfast.  3  . ferrous sulfate (SLOW FE) 160 (50 Fe) MG TBCR SR tablet Take 1 tablet (160 mg total) by mouth daily. 30 each 3  . glipiZIDE (GLUCOTROL) 5 MG tablet TAKE 1/2 TABLET BY MOUTH DAILY BEFORE BREAKFAST 15 tablet 0  . nitroGLYCERIN (NITROSTAT) 0.4 MG SL tablet Place 1 tablet (0.4 mg total) under the tongue every 5 (five) minutes as needed for chest pain.  12  . traMADol (ULTRAM) 50 MG tablet Take by mouth every 6 (six) hours as needed.    . zolpidem (AMBIEN) 10 MG tablet Take 10 mg by  mouth at bedtime as needed for sleep.     No current facility-administered medications for this visit.     Allergies:   Amlodipine; Codeine; Coreg [carvedilol]; Fentanyl; Lacosamide; Levetiracetam; Metformin and related; Metoprolol; Morphine and related; Oxybutynin; Sertraline; Tessalon [benzonatate]; and Other    Social History:  The patient  reports that she quit smoking about 12 years ago. Her smoking use included cigarettes. She has a 50.00 pack-year smoking history. She has never used smokeless tobacco. She reports that she does not drink alcohol or  use drugs.   Family History:  The patient's family history includes Cancer in her mother; Heart disease in her father.    ROS:  Please see the history of present illness. All other systems are reviewed and negative.    PHYSICAL EXAM: VS:  BP 122/60 (BP Location: Left Arm, Patient Position: Sitting)   Pulse 72   Ht 5\' 1"  (1.549 m)   Wt 124 lb (56.2 kg)   BMI 23.43 kg/m  , BMI Body mass index is 23.43 kg/m. GEN: Frail, elderly, female in mild-moderate distress  HEENT: normal for age  Neck: no JVD, R>L carotid bruits, no masses Cardiac: RRR; soft murmur, no rubs, or gallops Respiratory: slightly decreased BS bases bilaterally, normal work of breathing GI: soft, nontender, nondistended, + BS MS: no deformity or atrophy; no edema; distal pulses are 1-2+ in all 4 extremities   Skin: warm and dry, no rash Neuro:  Strength and sensation are generally weak. No obvious deficits  EKG:  EKG is ordered today. The ekg ordered today demonstrates SR, HR 72, no acute ischemic changes, normal intervals  CARDIAC CATH: 07/13/2018  Dist LM lesion is 25% stenosed.  Ost LAD lesion is 50% stenosed.  Prox LAD lesion is 75% stenosed.  Ost 1st Diag lesion is 99% stenosed.  Prox RCA lesion is 99% stenosed.  There is no aortic valve stenosis.  LV end diastolic pressure is mildly elevated.  Tortuosity in the right subclavian makes torquing  catheter for the RCA somewhat difficult.   COntinue aspirin.  Get CVTS consult for severe multivessel calcific CAD.  (Patient turned down for bypas  PCI: 07/16/2018  Dist LM lesion is 25% stenosed.  Ost LAD lesion is 50% stenosed.  Prox LAD lesion is 75% stenosed.  A drug-eluting stent was successfully placed using a STENT SIERRA 2.75 X 18 MM.  Ost 1st Diag lesion is 99% stenosed. THis was jailed by the stent.  Post intervention, there is a 0% residual stenosis.  Prox RCA lesion is 99% stenosed.  A drug-eluting stent was successfully placed after orbital atherectomy, using a STENT SIERRA 3.00 X 18 MM.  Post intervention, there is a 0% residual stenosis.  LV end diastolic pressure is normal. LVEDP 10 mm Hg.  There is no aortic valve stenosis. Post-Intervention Diagram           CAROTID DOPPLERS: 07/19/2018 Final Interpretation: Right Carotid: Velocities in the right ICA are consistent with a 40-59%        stenosis.  Left Carotid: Velocities in the left ICA are consistent with a 1-39% stenosis.  Vertebrals: Bilateral vertebral arteries demonstrate antegrade flow.  Dg Chest 1 View  Result Date: 07/24/2018 CLINICAL DATA:  Left-sided ribcage discomfort associated with abdominal pain, nausea and vomiting, and weakness beginning this morning. History of coronary artery disease and previous MI, CVA, renal cell malignancy, former smoker. EXAM: CHEST  1 VIEW COMPARISON:  PA and lateral chest x-ray of July 14, 2018 FINDINGS: The lungs are mildly hyperinflated and clear. The right costophrenic angle is excluded from the study. The heart and pulmonary vascularity are normal. There is calcification in the wall of the aortic arch. The observed bony thorax is unremarkable. IMPRESSION: COPD-reactive airway disease. No pneumonia, CHF, nor other acute cardiopulmonary abnormality. Thoracic aortic atherosclerosis. Electronically Signed   By: David  Martinique M.D.   On: 07/24/2018 11:15     Dg Chest 2 View  Result Date: 07/14/2018 CLINICAL DATA:  Chest pain and shortness of breath. EXAM: CHEST -  2 VIEW COMPARISON:  07/10/2017 FINDINGS: The cardiac silhouette, mediastinal and hilar contours are within normal limits and stable. There is tortuosity and dense calcification of the thoracic aorta. The lungs are clear. No pleural effusions. The bony thorax is intact. IMPRESSION: No acute cardiopulmonary findings. Electronically Signed   By: Marijo Sanes M.D.   On: 07/14/2018 09:23   Ct Head Wo Contrast  Result Date: 07/24/2018 CLINICAL DATA:  Ataxia EXAM: CT HEAD WITHOUT CONTRAST TECHNIQUE: Contiguous axial images were obtained from the base of the skull through the vertex without intravenous contrast. COMPARISON:  MRI 07/18/2018 FINDINGS: Brain: Previously seen right cerebral and cerebellar infarcts are not visible by CT. No visible acute infarction or hemorrhage. No hydrocephalus. Moderate chronic small vessel disease changes throughout the deep white matter. Vascular: No hyperdense vessel or unexpected calcification. Skull: No acute calvarial abnormality. Sinuses/Orbits: Visualized paranasal sinuses and mastoids clear. Orbital soft tissues unremarkable. Other: None IMPRESSION: Previously seen small scattered right cerebral and cerebellar infarcts not visible by CT. No new infarction seen. Moderate chronic small vessel disease. Electronically Signed   By: Rolm Baptise M.D.   On: 07/24/2018 11:10   Mr Brain Wo Contrast  Result Date: 07/18/2018 CLINICAL DATA:  Perioral numbness since yesterday, RIGHT-sided weakness. History of renal cell cancer, carotid artery disease, hypertension, hyperlipidemia, seizure. EXAM: MRI HEAD WITHOUT CONTRAST TECHNIQUE: Multiplanar, multiecho pulse sequences of the brain and surrounding structures were obtained without intravenous contrast. COMPARISON:  CT HEAD July 10, 2018 MRI head October 14, 2017 FINDINGS: INTRACRANIAL CONTENTS: Subcentimeter reduced diffusion  RIGHT cerebellum, additional subcentimeter foci reduced diffusion RIGHT frontal and RIGHT occipital lobes. Identifiable lesions demonstrate low ADC values. Confluent supratentorial and pontine white matter FLAIR T2 hyperintensities. Prominent basal ganglia and thalami perivascular spaces associated chronic small vessel ischemic changes. No parenchymal brain volume loss for age. LEFT anterior temporal pole encephalomalacia. No hydrocephalus. No midline shift, mass effect or masses. No abnormal extra-axial fluid collections. VASCULAR: Normal major intracranial vascular flow voids present at skull base. SKULL AND UPPER CERVICAL SPINE: No abnormal sellar expansion. No suspicious calvarial bone marrow signal. Craniocervical junction maintained. SINUSES/ORBITS: Chronic RIGHT sphenoid sinusitis. Included ocular globes and orbital contents are non-suspicious. OTHER: None. IMPRESSION: 1. Multiple acute small RIGHT cerebrum and RIGHT cerebellar nonhemorrhagic infarcts. 2. Severe white matter changes compatible with chronic small vessel ischemic changes/chronic hypertensive encephalopathy. 3. LEFT temporal lobe encephalomalacia suggesting TBI. Electronically Signed   By: Elon Alas M.D.   On: 07/18/2018 19:59   US Renal  Result Date: 07/11/2018 CLINICAL DATA:  Initial evaluation for acute renal injury. Status post right nephrectomy. EXAM: RENAL / URINARY TRACT ULTRASOUND COMPLETE COMPARISON:  None. FINDINGS: Right Kidney: Surgically absent. Left Kidney: Length: 10.4 cm. Echogenicity within normal limits. No hydronephrosis. 1.6 x 1.5 x 1.4 cm cyst present at the upper pole. Additional 1.1 x 0.8 x 1.0 cyst at the upper pole. Bladder: Appears normal for degree of bladder distention. Left jet visualized. IMPRESSION: 1. Left kidney demonstrates no hydronephrosis or other acute abnormality. 2. Left renal cysts as above. 3. Prior right nephrectomy. Electronically Signed   By: Jeannine Boga M.D.   On: 07/11/2018  23:42   Dg Hip Unilat With Pelvis 2-3 Views Right  Result Date: 07/19/2018 CLINICAL DATA:  Chronic right hip pain. Pain radiates down right leg. EXAM: DG HIP (WITH OR WITHOUT PELVIS) 2-3V RIGHT COMPARISON:  02/07/2018. FINDINGS: Degenerative changes lumbar spine and both hips. No acute bony abnormality identified. No evidence of fracture. Aortoiliac atherosclerotic vascular calcification.  IMPRESSION: 1. Degenerative changes lumbar spine and both hips. No acute abnormality. 2.  Aortoiliac atherosclerotic vascular disease. Electronically Signed   By: Marcello Moores  Register   On: 07/19/2018 12:30    Recent Labs: 12/18/2017: Magnesium 1.8 07/11/2018: ALT 14 07/14/2018: TSH 2.332 07/17/2018: Hemoglobin 7.8; Platelets 272 07/18/2018: BUN 14; Creatinine, Ser 1.48; Potassium 3.6; Sodium 138    Lipid Panel    Component Value Date/Time   CHOL 186 09/25/2017 1444   TRIG 124 09/25/2017 1444   HDL 43 09/25/2017 1444   CHOLHDL 4.3 09/25/2017 1444   CHOLHDL 2.4 05/03/2016 1124   VLDL 25 05/03/2016 1124   LDLCALC 118 (H) 09/25/2017 1444     Wt Readings from Last 3 Encounters:  07/24/18 124 lb (56.2 kg)  07/19/18 123 lb 14.4 oz (56.2 kg)  12/18/17 123 lb 6.4 oz (56 kg)     Other studies Reviewed: Additional studies/ records that were reviewed today include: office notes, hospital records and testing.  ASSESSMENT AND PLAN:  1.  NSTEMI: Pt is on good therapy, w/ ASA, Plavix, low-dose Coreg and statin.  Continue these his GI symptoms will allow  2. N&V:  - unclear if the N&V is related to her cardiac meds, but the Plavix is n not new, nor is the iron.  - only new rx is the Coreg - She is extremely weak and unable to keep anything down.  She vomited repeatedly in the office. - EMS was contacted and will transport her to the emergency room for further evaluation and treatment.  3.  CKD 3-4 - I am concerned that worsening of her renal function in general is causing the nausea and vomiting. - If  something else is causing the nausea and vomiting that in itself could dehydrate her and make her renal function worse - Continue to follow  4.  Hypertension: Her blood pressure is controlled in the office.  We are not able to check orthostatics because she is too weak to get up on the table.  5.  CVA: -She is having some worsening of the paresthesias around her mouth today and some tingling in her hands. -Eval per ER MD.  Current medicines are reviewed at length with the patient today.  The patient does not have concerns regarding medicines.  The following changes have been made:  no change  Labs/ tests ordered today include:   Orders Placed This Encounter  Procedures  . EKG 12-Lead     Disposition:   FU with Dr. Gwenlyn Found  Signed, Rosaria Ferries, PA-C  07/24/2018 10:28 AM    [place] Phone: [phone]; Fax: [phone]  This note was written with the assistance of speech recognition software. Please excuse any transcriptional errors.

## 2018-07-24 NOTE — Patient Instructions (Signed)
Proceed to the ER by EMS

## 2018-07-24 NOTE — ED Provider Notes (Signed)
[facility] DEPT Provider Note   CSN: [id number] Arrival date & time: 07/24/18  4098     History   Chief Complaint Chief Complaint  Patient presents with  . Abdominal Pain    HPI Gabrielle [name] is a 83 y.o. female.  83 year old female with prior medical history as detailed below presents with complaint of nausea, vomiting, and vague left upper quadrant / left lateral chest wall discomfort.  She reports her symptoms started today.  She denies associated fever.  She denies associated shortness of breath.  She reports recent admission and discharge on 7/25.  The history is provided by the patient and medical records.  Illness  This is a new problem. The current episode started 3 to 5 hours ago. The problem occurs constantly. The problem has not changed since onset.Associated symptoms include chest pain. Pertinent negatives include no abdominal pain, no headaches and no shortness of breath. Nothing aggravates the symptoms. Nothing relieves the symptoms. She has tried nothing for the symptoms.    Past Medical History:  Diagnosis Date  . Anemia    hx  . Anxiety   . Arthritis   . Azotemia   . Carotid artery disease ([place])   . Claudication ([place])   . Coronary artery disease   . CVA (cerebral vascular accident) ([place]) 2008   LOC DIZZINESS ; "mini stroke" (07/12/2018)  . Depression   . Essential hypertension, benign   . History of gout   . History of kidney stones   . Hyperlipidemia   . Migraine    "years ago; due to vision issues; got glasses & they went away" (07/12/2018)  . Neck pain   . NSTEMI (non-ST elevated myocardial infarction) ([place]) 07/11/2018  . PAD (peripheral artery disease) ([facility])    hx. LCEA, hx Lt renal art. stenting, occl rt renal artery, occluded bil SFAs, moderatie iliac disease,    . Renal cell cancer ([place]) 2001   "left"  . Seizure ([place])    pt does not recall this hx on 07/12/2018  . Type II diabetes mellitus ([place]) 1990     Patient Active Problem List   Diagnosis Date Noted  . Chest pain   . Status post coronary artery stent placement   . CAD (coronary artery disease), native coronary artery 07/15/2018  . AKI (acute kidney injury) ([place])   . CKD (chronic kidney disease), stage IV ([place])   . NSTEMI (non-ST elevated myocardial infarction) ([place]) 07/11/2018  . Anemia due to chronic kidney disease 07/11/2018  . Iron deficiency anemia 07/08/2016  . Chronic insomnia 12/10/2015  . Spinal stenosis of lumbar region 05/21/2014  . Carotid artery disease ([place]) 02/26/2014  . PAD (peripheral artery disease) ([place])   . Renal cell cancer ([place])   . Type 2 diabetes mellitus with hypoglycemia without coma ([place])   . Hypertensive heart disease without CHF   . Hyperlipidemia   . History of anemia   . CVA (cerebral vascular accident) [facility])     Past Surgical History:  Procedure Laterality Date  . ABDOMINAL HYSTERECTOMY    . BACK SURGERY    . CAROTID ENDARTERECTOMY Left   . CATARACT EXTRACTION W/ INTRAOCULAR LENS  IMPLANT, BILATERAL Bilateral   . CORONARY ATHERECTOMY N/A 07/16/2018   Procedure: CORONARY ATHERECTOMY;  Surgeon: Jettie Booze, MD;  Location: [facility];  Service: Cardiovascular;  Laterality: N/A;  . CORONARY STENT INTERVENTION  07/16/2018  . CORONARY STENT INTERVENTION N/A 07/16/2018   Procedure: CORONARY STENT INTERVENTION;  Surgeon: Jettie Booze, MD;  Location: [facility];  Service: Cardiovascular;  Laterality: N/A;  . INCISION AND DRAINAGE ABSCESS Right 10/26/2017   Procedure: INCISION AND DRAINAGE HEMATOMA RIGHT SHIN;  Surgeon: Georganna Skeans, MD;  Location: [facility];  Service: General;  Laterality: Right;  . LEFT HEART CATH AND CORONARY ANGIOGRAPHY N/A 07/13/2018   Procedure: LEFT HEART CATH AND CORONARY ANGIOGRAPHY;  Surgeon: Jettie Booze, MD;  Location: [facility];  Service: Cardiovascular;  Laterality: N/A;  . LEFT HEART CATH AND CORONARY ANGIOGRAPHY N/A  07/16/2018   Procedure: LEFT HEART CATH AND CORONARY ANGIOGRAPHY;  Surgeon: Jettie Booze, MD;  Location: [facility];  Service: Cardiovascular;  Laterality: N/A;  . LUMBAR [place]    . NEPHRECTOMY Right   . NM MYOCAR PERF WALL MOTION  07/12/2011   normal  . PV angiogram  2004   Lt renal artery stent  . TEMPORARY PACEMAKER N/A 07/16/2018   Procedure: TEMPORARY PACEMAKER;  Surgeon: Jettie Booze, MD;  Location: [facility];  Service: Cardiovascular;  Laterality: N/A;  . TONSILLECTOMY    . URETERAL STENT PLACEMENT Left      OB History   None      Home Medications    Prior to Admission medications   Medication Sig Start Date End Date Taking? Authorizing Provider  acetaminophen (TYLENOL) 500 MG tablet Take 1,000 mg by mouth every 6 (six) hours as needed.    [provider]  amLODipine (NORVASC) 5 MG tablet Take 5 mg by mouth daily. 05/28/18   [provider]  aspirin 81 MG tablet Take 81 mg by mouth daily.    [provider]  atorvastatin (LIPITOR) 40 MG tablet Take 1 tablet (40 mg total) by mouth daily at 6 PM. 07/19/18   Gherghe, Vella Redhead, MD  carvedilol (COREG) 3.125 MG tablet Take 1 tablet (3.125 mg total) by mouth 2 (two) times daily with a meal. 07/19/18   Caren Griffins, MD  clopidogrel (PLAVIX) 75 MG tablet Take 1 tablet (75 mg total) by mouth daily. 07/20/18   Caren Griffins, MD  ferrous gluconate (FERGON) 324 MG tablet Take 1 tablet (324 mg total) by mouth daily with breakfast. 07/19/18   Caren Griffins, MD  ferrous sulfate (SLOW FE) 160 (50 Fe) MG TBCR SR tablet Take 1 tablet (160 mg total) by mouth daily. 10/23/17   Forrest Moron, MD  glipiZIDE (GLUCOTROL) 5 MG tablet TAKE 1/2 TABLET BY MOUTH DAILY BEFORE BREAKFAST 06/21/18   [name], MD  nitroGLYCERIN (NITROSTAT) 0.4 MG SL tablet Place 1 tablet (0.4 mg total) under the tongue every 5 (five) minutes as needed for chest pain. 07/19/18   Caren Griffins,  MD  traMADol (ULTRAM) 50 MG tablet Take by mouth every 6 (six) hours as needed.    [provider]  zolpidem (AMBIEN) 10 MG tablet Take 10 mg by mouth at bedtime as needed for sleep.    [provider]    Family History Family History  Problem Relation Age of Onset  . Cancer Mother   . Heart disease Father     Social History Social History   Tobacco Use  . Smoking status: Former Smoker    Packs/day: 1.00    Years: 50.00    Pack years: 50.00    Types: Cigarettes    Last attempt to quit: 12/26/2005    Years since quitting: 12.5  . Smokeless tobacco:  Never Used  Substance Use Topics  . Alcohol use: Never    Frequency: Never  . Drug use: Never     Allergies   Amlodipine; Codeine; Coreg [carvedilol]; Fentanyl; Lacosamide; Levetiracetam; Metformin and related; Metoprolol; Morphine and related; Oxybutynin; Sertraline; Tessalon [benzonatate]; and Other   Review of Systems Review of Systems  Respiratory: Negative for shortness of breath.   Cardiovascular: Positive for chest pain.  Gastrointestinal: Negative for abdominal pain.  Neurological: Negative for headaches.  All other systems reviewed and are negative.    Physical Exam Updated Vital Signs BP (!) 139/55   Pulse (!) 147   Temp 97.8 F (36.6 C) (Oral)   Resp (!) 7   SpO2 (!) 80%   Physical Exam  Constitutional: She is oriented to person, place, and time. She appears well-developed and well-nourished. No distress.  HENT:  Head: Normocephalic and atraumatic.  Mouth/Throat: Oropharynx is clear and moist.  Eyes: Pupils are equal, round, and reactive to light. Conjunctivae and EOM are normal.  Neck: Normal range of motion. Neck supple.  Cardiovascular: Normal rate, regular rhythm and normal heart sounds.  Pulmonary/Chest: Effort normal and breath sounds normal. No respiratory distress.  Abdominal: Soft. She exhibits no distension. There is no tenderness.  Musculoskeletal: Normal range of motion.  She exhibits no edema or deformity.  Neurological: She is alert and oriented to person, place, and time.  Skin: Skin is warm and dry.  Psychiatric: She has a normal mood and affect.  Nursing note and vitals reviewed.    ED Treatments / Results  Labs (all labs ordered are listed, but only abnormal results are displayed) Labs Reviewed  COMPREHENSIVE METABOLIC PANEL - Abnormal; Notable for the following components:      Result Value   Glucose, Bld 162 (*)    Creatinine, Ser 1.71 (*)    Calcium 8.8 (*)    GFR calc non Af Amer 27 (*)    GFR calc Af Amer 31 (*)    All other components within normal limits  CBC WITH DIFFERENTIAL/PLATELET - Abnormal; Notable for the following components:   RBC 3.17 (*)    Hemoglobin 9.0 (*)    HCT 28.2 (*)    RDW 18.9 (*)    Neutro Abs 7.9 (*)    All other components within normal limits  TROPONIN I - Abnormal; Notable for the following components:   Troponin I 0.03 (*)    All other components within normal limits  LIPASE, BLOOD  PROTIME-INR  URINALYSIS, ROUTINE W REFLEX MICROSCOPIC  TYPE AND SCREEN  ABO/RH    EKG EKG Interpretation  Date/Time:  Tuesday July 24 2018 11:36:52 EDT Ventricular Rate:  68 PR Interval:    QRS Duration: 81 QT Interval:  374 QTC Calculation: 398 R Axis:   39 Text Interpretation:  Sinus rhythm Repol abnrm suggests ischemia, inferior leads Borderline ST elevation, lateral leads Confirmed by Dene Gentry [phone]) on 07/24/2018 11:44:30 AM   Radiology Dg Chest 1 View  Result Date: 07/24/2018 CLINICAL DATA:  Left-sided ribcage discomfort associated with abdominal pain, nausea and vomiting, and weakness beginning this morning. History of coronary artery disease and previous MI, CVA, renal cell malignancy, former smoker. EXAM: CHEST  1 VIEW COMPARISON:  PA and lateral chest x-ray of July 14, 2018 FINDINGS: The lungs are mildly hyperinflated and clear. The right costophrenic angle is excluded from the study. The heart  and pulmonary vascularity are normal. There is calcification in the wall of the aortic arch. The observed bony  thorax is unremarkable. IMPRESSION: COPD-reactive airway disease. No pneumonia, CHF, nor other acute cardiopulmonary abnormality. Thoracic aortic atherosclerosis. Electronically Signed   By: David  Martinique M.D.   On: 07/24/2018 11:15   Ct Head Wo Contrast  Result Date: 07/24/2018 CLINICAL DATA:  Ataxia EXAM: CT HEAD WITHOUT CONTRAST TECHNIQUE: Contiguous axial images were obtained from the base of the skull through the vertex without intravenous contrast. COMPARISON:  MRI 07/18/2018 FINDINGS: Brain: Previously seen right cerebral and cerebellar infarcts are not visible by CT. No visible acute infarction or hemorrhage. No hydrocephalus. Moderate chronic small vessel disease changes throughout the deep white matter. Vascular: No hyperdense vessel or unexpected calcification. Skull: No acute calvarial abnormality. Sinuses/Orbits: Visualized paranasal sinuses and mastoids clear. Orbital soft tissues unremarkable. Other: None IMPRESSION: Previously seen small scattered right cerebral and cerebellar infarcts not visible by CT. No new infarction seen. Moderate chronic small vessel disease. Electronically Signed   By: Rolm Baptise M.D.   On: 07/24/2018 11:10    Procedures Procedures (including critical care time)  Medications Ordered in ED Medications  sodium chloride 0.9 % bolus 500 mL (0 mLs Intravenous Stopped 07/24/18 1351)  ondansetron (ZOFRAN) injection 4 mg (4 mg Intravenous Given 07/24/18 1158)     Initial Impression / Assessment and Plan / ED Course  I have reviewed the triage vital signs and the nursing notes.  Pertinent labs & imaging results that were available during my care of the patient were reviewed by me and considered in my medical decision making (see chart for details).     MDM  Screen complete  Patient is presenting for evaluation of nausea and vomiting with associated  chest discomfort.  Screening labs are reassuring.  Patient appears to be improved following Zofran.  Given patient's significant comorbidities, I am inclined to admit the patient for observation.  Case discussed with the hospitalist service Wynelle Cleveland) who will evaluate for admission.  Final Clinical Impressions(s) / ED Diagnoses   Final diagnoses:  Nausea and vomiting, intractability of vomiting not specified, unspecified vomiting type    ED Discharge Orders    None       Valarie Merino, MD 07/24/18 [phone]

## 2018-07-24 NOTE — H&P (Addendum)
History and Physical    GUSTAVO MEDITZ  WGN:[id number]  DOB: [birth year]  DOA: 07/24/2018 PCP: System, Pcp Not In   Patient coming from: home  Chief Complaint: vomiting  HPI: Gabrielle [name] is a 83 y.o. female with medical history of recent MI, RCC with nephrectomy, CKD 4, DM2 was discharged from the hospital on 7/25 after being treated for an NSTEMI and when to a SNF. She presents back to the hospital for 3 episodes of vomiting starting last night. She is unable to keep down solids and liquids. No abdominal pain, diarrhea or fevers. She has pain in her left jaw due to a few bad teeth and the facility she is in has still not made her and appt with the dentist. She is upset by this.  She has other aches and pains as well in various parts of her body and thinks she was given a pain medication at the facility but nothing has helped her pain.   ED Course: Cr 1.71 up from 1.48.    Review of Systems:  All other systems reviewed and apart from HPI, are negative.  Past Medical History:  Diagnosis Date  . Anemia    hx  . Anxiety   . Arthritis   . Azotemia   . Carotid artery disease ([place])   . Claudication ([place])   . Coronary artery disease   . CVA (cerebral vascular accident) ([place]) 2008   LOC DIZZINESS ; "mini stroke" (07/12/2018)  . Depression   . Essential hypertension, benign   . History of gout   . History of kidney stones   . Hyperlipidemia   . Migraine    "years ago; due to vision issues; got glasses & they went away" (07/12/2018)  . Neck pain   . NSTEMI (non-ST elevated myocardial infarction) ([place]) 07/11/2018  . PAD (peripheral artery disease) (H[facility])    hx. LCEA, hx Lt renal art. stenting, occl rt renal artery, occluded bil SFAs, moderatie iliac disease,    . Renal cell cancer ([place]) 2001   "left"  . Seizure ([place])    pt does not recall this hx on 07/12/2018  . Type II diabetes mellitus ([place]) 1990    Past Surgical History:  Procedure Laterality Date  . ABDOMINAL  HYSTERECTOMY    . BACK SURGERY    . CAROTID ENDARTERECTOMY Left   . CATARACT EXTRACTION W/ INTRAOCULAR LENS  IMPLANT, BILATERAL Bilateral   . CORONARY ATHERECTOMY N/A 07/16/2018   Procedure: CORONARY ATHERECTOMY;  Surgeon: Jettie Booze, MD;  Location: [facility];  Service: Cardiovascular;  Laterality: N/A;  . CORONARY STENT INTERVENTION  07/16/2018  . CORONARY STENT INTERVENTION N/A 07/16/2018   Procedure: CORONARY STENT INTERVENTION;  Surgeon: Jettie Booze, MD;  Location: [facility];  Service: Cardiovascular;  Laterality: N/A;  . INCISION AND DRAINAGE ABSCESS Right 10/26/2017   Procedure: INCISION AND DRAINAGE HEMATOMA RIGHT SHIN;  Surgeon: Georganna Skeans, MD;  Location: [facility];  Service: General;  Laterality: Right;  . LEFT HEART CATH AND CORONARY ANGIOGRAPHY N/A 07/13/2018   Procedure: LEFT HEART CATH AND CORONARY ANGIOGRAPHY;  Surgeon: Jettie Booze, MD;  Location: [facility];  Service: Cardiovascular;  Laterality: N/A;  . LEFT HEART CATH AND CORONARY ANGIOGRAPHY N/A 07/16/2018   Procedure: LEFT HEART CATH AND CORONARY ANGIOGRAPHY;  Surgeon: Jettie Booze, MD;  Location: [facility];  Service: Cardiovascular;  Laterality: N/A;  . LUMBAR [place]    . NEPHRECTOMY Right   .  NM MYOCAR PERF WALL MOTION  07/12/2011   normal  . PV angiogram  2004   Lt renal artery stent  . TEMPORARY PACEMAKER N/A 07/16/2018   Procedure: TEMPORARY PACEMAKER;  Surgeon: Jettie Booze, MD;  Location: [facility];  Service: Cardiovascular;  Laterality: N/A;  . TONSILLECTOMY    . URETERAL STENT PLACEMENT Left     Social History:   reports that she quit smoking about 12 years ago. Her smoking use included cigarettes. She has a 50.00 pack-year smoking history. She has never used smokeless tobacco. She reports that she does not drink alcohol or use drugs.  Allergies  Allergen Reactions  . Amlodipine Itching    Med resumed 05/28/18  . Codeine  Nausea And Vomiting  . Coreg [Carvedilol] Nausea And Vomiting  . Fentanyl Other (See Comments)    unknown  . Lacosamide Other (See Comments)     Other name is, VIMPAT unknown  . Levetiracetam Other (See Comments)    Strange feelings in head  . Metformin And Related Other (See Comments)    unknown  . Metoprolol Other (See Comments)    unknown  . Morphine And Related Itching  . Oxybutynin Swelling    mouth  . Sertraline Nausea Only and Other (See Comments)    Swelling in mouth  . Tessalon [Benzonatate] Other (See Comments)    unknown  . Other Rash    BETA BLOCKER    Family History  Problem Relation Age of Onset  . Cancer Mother   . Heart disease Father      Prior to Admission medications   Medication Sig Start Date End Date Taking? Authorizing Provider  acetaminophen (TYLENOL) 500 MG tablet Take 1,000 mg by mouth every 6 (six) hours as needed.    [provider]  amLODipine (NORVASC) 5 MG tablet Take 5 mg by mouth daily. 05/28/18   [provider]  aspirin 81 MG tablet Take 81 mg by mouth daily.    [provider]  atorvastatin (LIPITOR) 40 MG tablet Take 1 tablet (40 mg total) by mouth daily at 6 PM. 07/19/18   Gherghe, Vella Redhead, MD  carvedilol (COREG) 3.125 MG tablet Take 1 tablet (3.125 mg total) by mouth 2 (two) times daily with a meal. 07/19/18   Caren Griffins, MD  clopidogrel (PLAVIX) 75 MG tablet Take 1 tablet (75 mg total) by mouth daily. 07/20/18   Caren Griffins, MD  ferrous gluconate (FERGON) 324 MG tablet Take 1 tablet (324 mg total) by mouth daily with breakfast. 07/19/18   Caren Griffins, MD  ferrous sulfate (SLOW FE) 160 (50 Fe) MG TBCR SR tablet Take 1 tablet (160 mg total) by mouth daily. 10/23/17   Forrest Moron, MD  glipiZIDE (GLUCOTROL) 5 MG tablet TAKE 1/2 TABLET BY MOUTH DAILY BEFORE BREAKFAST 06/21/18   [name], MD  nitroGLYCERIN (NITROSTAT) 0.4 MG SL tablet Place 1 tablet (0.4 mg total) under the tongue every 5  (five) minutes as needed for chest pain. 07/19/18   Caren Griffins, MD  traMADol (ULTRAM) 50 MG tablet Take by mouth every 6 (six) hours as needed.    [provider]  zolpidem (AMBIEN) 10 MG tablet Take 10 mg by mouth at bedtime as needed for sleep.    [provider]    Physical Exam: Wt Readings from Last 3 Encounters:  07/24/18 56.2 kg (124 lb)  07/19/18 56.2 kg (123 lb 14.4 oz)  12/18/17 56 kg (  123 lb 6.4 oz)   Vitals:   07/24/18 1430 07/24/18 1500 07/24/18 1501 07/24/18 1530  BP: (!) 139/55 (!) 140/45 (!) 139/55 (!) 148/48  Pulse:  69 73 65  Resp: (!) 7 (!) 9 16 (!) 9  Temp:      TempSrc:      SpO2:  100% 94% 100%      Constitutional:  Calm & comfortable Eyes: PERRLA, lids and conjunctivae normal ENT:  Mucous membranes are moist.  Pharynx clear of exudate   Normal dentition.  Neck: Supple, no masses  Respiratory:  Clear to auscultation bilaterally  Normal respiratory effort.  Cardiovascular:  S1 & S2 heard, regular rate and rhythm No Murmurs Abdomen:  Non distended No tenderness, No masses Bowel sounds normal Extremities:  No clubbing / cyanosis No pedal edema No joint deformity    Skin:  No rashes, lesions or ulcers Neurologic:  AAO x 3 CN 2-12 grossly intact Sensation intact Strength 5/5 in all 4 extremities Psychiatric:  Normal Mood and affect    Labs on Admission: I have personally reviewed following labs and imaging studies  CBC: Recent Labs  Lab 07/24/18 1158  WBC 9.2  NEUTROABS 7.9*  HGB 9.0*  HCT 28.2*  MCV 89.0  PLT 998   Basic Metabolic Panel: Recent Labs  Lab 07/18/18 0412 07/24/18 1158  NA 138 136  K 3.6 4.7  CL 109 102  CO2 21* 26  GLUCOSE 89 162*  BUN 14 20  CREATININE 1.48* 1.71*  CALCIUM 8.4* 8.8*   GFR: Estimated Creatinine Clearance: 19.8 mL/min (A) (by C-G formula based on SCr of 1.71 mg/dL (H)). Liver Function Tests: Recent Labs  Lab 07/24/18 1158  AST 17  ALT 20  ALKPHOS 70    BILITOT 0.8  PROT 7.0  ALBUMIN 3.6   Recent Labs  Lab 07/24/18 1158  LIPASE 39   No results for input(s): AMMONIA in the last 168 hours. Coagulation Profile: Recent Labs  Lab 07/24/18 1158  INR 0.99   Cardiac Enzymes: Recent Labs  Lab 07/24/18 1158  TROPONINI 0.03*   BNP (last 3 results) No results for input(s): PROBNP in the last 8760 hours. HbA1C: No results for input(s): HGBA1C in the last 72 hours. CBG: Recent Labs  Lab 07/18/18 1227 07/18/18 1626 07/18/18 2113 07/19/18 0814 07/19/18 1223  GLUCAP 133* 211* 145* 71 145*   Lipid Profile: No results for input(s): CHOL, HDL, LDLCALC, TRIG, CHOLHDL, LDLDIRECT in the last 72 hours. Thyroid Function Tests: No results for input(s): TSH, T4TOTAL, FREET4, T3FREE, THYROIDAB in the last 72 hours. Anemia Panel: No results for input(s): VITAMINB12, FOLATE, FERRITIN, TIBC, IRON, RETICCTPCT in the last 72 hours. Urine analysis:    Component Value Date/Time   COLORURINE YELLOW 10/13/2017 1445   APPEARANCEUR HAZY (A) 10/13/2017 1445   LABSPEC 1.019 10/13/2017 1445   PHURINE 5.0 10/13/2017 1445   GLUCOSEU 50 (A) 10/13/2017 1445   HGBUR NEGATIVE 10/13/2017 1445   BILIRUBINUR negative 10/23/2017 1110   BILIRUBINUR neg 04/10/2015 1406   KETONESUR negative 10/23/2017 1110   KETONESUR NEGATIVE 10/13/2017 1445   PROTEINUR =100 (A) 10/23/2017 1110   PROTEINUR 100 (A) 10/13/2017 1445   UROBILINOGEN 0.2 10/23/2017 1110   UROBILINOGEN 0.2 12/23/2014 1736   NITRITE Negative 10/23/2017 1110   NITRITE NEGATIVE 10/13/2017 1445   LEUKOCYTESUR Trace (A) 10/23/2017 1110   Sepsis Labs: @LABRCNTIP (procalcitonin:4,lacticidven:4) )No results found for this or any previous visit (from the past 240 hour(s)).   Radiological Exams on Admission: Dg  Chest 1 View  Result Date: 07/24/2018 CLINICAL DATA:  Left-sided ribcage discomfort associated with abdominal pain, nausea and vomiting, and weakness beginning this morning. History of  coronary artery disease and previous MI, CVA, renal cell malignancy, former smoker. EXAM: CHEST  1 VIEW COMPARISON:  PA and lateral chest x-ray of July 14, 2018 FINDINGS: The lungs are mildly hyperinflated and clear. The right costophrenic angle is excluded from the study. The heart and pulmonary vascularity are normal. There is calcification in the wall of the aortic arch. The observed bony thorax is unremarkable. IMPRESSION: COPD-reactive airway disease. No pneumonia, CHF, nor other acute cardiopulmonary abnormality. Thoracic aortic atherosclerosis. Electronically Signed   By: David  Martinique M.D.   On: 07/24/2018 11:15   Ct Head Wo Contrast  Result Date: 07/24/2018 CLINICAL DATA:  Ataxia EXAM: CT HEAD WITHOUT CONTRAST TECHNIQUE: Contiguous axial images were obtained from the base of the skull through the vertex without intravenous contrast. COMPARISON:  MRI 07/18/2018 FINDINGS: Brain: Previously seen right cerebral and cerebellar infarcts are not visible by CT. No visible acute infarction or hemorrhage. No hydrocephalus. Moderate chronic small vessel disease changes throughout the deep white matter. Vascular: No hyperdense vessel or unexpected calcification. Skull: No acute calvarial abnormality. Sinuses/Orbits: Visualized paranasal sinuses and mastoids clear. Orbital soft tissues unremarkable. Other: None IMPRESSION: Previously seen small scattered right cerebral and cerebellar infarcts not visible by CT. No new infarction seen. Moderate chronic small vessel disease. Electronically Signed   By: Rolm Baptise M.D.   On: 07/24/2018 11:10    EKG: Independently reviewed. NSR at 68 bpm  Assessment/Plan Principal Problem:   Vomiting x 3 episodes at SNF - PRN zofran and slow IVF- abdomen not tender and no diarrhea- liquid diet for now  Active Problems: Tooth aches - multiple tooth aches in left upper and lower jaw - needs dentist as outpt - states Tylenol is not helping - Cr too high for Motrin - PRN  Tramadol ordered-  Codiene causes vomiting- will need to treat her pain and her vomiting- I feel we should control her pain and give Zofran with the pain medications    Type 2 diabetes mellitus with hypoglycemia without coma  - hold Glucotrol- place on SSI   Status post coronary artery stent placement   Hypertensive heart disease without CHF   Hyperlipidemia -follow troponin-  first troponin is 0.03 - cont antihypertensives, statin, antiplatelet agents    Anemia due to chronic kidney disease - follow- hold Fergon with vomiting    AKI on CKD (chronic kidney disease), stage IV  - slow IVF started       DVT prophylaxis: Lovenox  Code Status: Full code  Family Communication:   Disposition Plan: return to SNF- PT consult placed  Consults called: none  Admission status: observation    Debbe Odea MD [facility] Pager: [email or web address] Password [email or web address] 7PM-7AM, please contact night-coverage   07/24/2018, 4:10 PM

## 2018-07-24 NOTE — ED Triage Notes (Signed)
EMS reports from [facility], c/o left sided mid abdominal/rib pain and weakness since this morning.  BP 158/64 HR 68 Resp 18 SP02 CBG 160

## 2018-07-24 NOTE — ED Notes (Signed)
Bed: BU03 Expected date:  Expected time:  Means of arrival:  Comments: EMS-vomiting

## 2018-07-25 DIAGNOSIS — Z961 Presence of intraocular lens: Secondary | ICD-10-CM | POA: Diagnosis present

## 2018-07-25 DIAGNOSIS — Z905 Acquired absence of kidney: Secondary | ICD-10-CM | POA: Diagnosis not present

## 2018-07-25 DIAGNOSIS — Z85528 Personal history of other malignant neoplasm of kidney: Secondary | ICD-10-CM | POA: Diagnosis not present

## 2018-07-25 DIAGNOSIS — K029 Dental caries, unspecified: Secondary | ICD-10-CM | POA: Diagnosis present

## 2018-07-25 DIAGNOSIS — K5900 Constipation, unspecified: Secondary | ICD-10-CM | POA: Diagnosis not present

## 2018-07-25 DIAGNOSIS — Z23 Encounter for immunization: Secondary | ICD-10-CM | POA: Diagnosis not present

## 2018-07-25 DIAGNOSIS — J302 Other seasonal allergic rhinitis: Secondary | ICD-10-CM | POA: Diagnosis not present

## 2018-07-25 DIAGNOSIS — I2510 Atherosclerotic heart disease of native coronary artery without angina pectoris: Secondary | ICD-10-CM | POA: Diagnosis present

## 2018-07-25 DIAGNOSIS — M2550 Pain in unspecified joint: Secondary | ICD-10-CM | POA: Diagnosis not present

## 2018-07-25 DIAGNOSIS — Z90710 Acquired absence of both cervix and uterus: Secondary | ICD-10-CM | POA: Diagnosis not present

## 2018-07-25 DIAGNOSIS — E119 Type 2 diabetes mellitus without complications: Secondary | ICD-10-CM | POA: Diagnosis not present

## 2018-07-25 DIAGNOSIS — D631 Anemia in chronic kidney disease: Secondary | ICD-10-CM | POA: Diagnosis present

## 2018-07-25 DIAGNOSIS — I214 Non-ST elevation (NSTEMI) myocardial infarction: Secondary | ICD-10-CM | POA: Diagnosis not present

## 2018-07-25 DIAGNOSIS — R112 Nausea with vomiting, unspecified: Secondary | ICD-10-CM | POA: Diagnosis not present

## 2018-07-25 DIAGNOSIS — M1990 Unspecified osteoarthritis, unspecified site: Secondary | ICD-10-CM | POA: Diagnosis present

## 2018-07-25 DIAGNOSIS — M6281 Muscle weakness (generalized): Secondary | ICD-10-CM | POA: Diagnosis not present

## 2018-07-25 DIAGNOSIS — Z9842 Cataract extraction status, left eye: Secondary | ICD-10-CM | POA: Diagnosis not present

## 2018-07-25 DIAGNOSIS — Z8673 Personal history of transient ischemic attack (TIA), and cerebral infarction without residual deficits: Secondary | ICD-10-CM | POA: Diagnosis not present

## 2018-07-25 DIAGNOSIS — I10 Essential (primary) hypertension: Secondary | ICD-10-CM | POA: Diagnosis not present

## 2018-07-25 DIAGNOSIS — Z87442 Personal history of urinary calculi: Secondary | ICD-10-CM | POA: Diagnosis not present

## 2018-07-25 DIAGNOSIS — Z111 Encounter for screening for respiratory tuberculosis: Secondary | ICD-10-CM | POA: Diagnosis not present

## 2018-07-25 DIAGNOSIS — E11649 Type 2 diabetes mellitus with hypoglycemia without coma: Secondary | ICD-10-CM | POA: Diagnosis present

## 2018-07-25 DIAGNOSIS — E1122 Type 2 diabetes mellitus with diabetic chronic kidney disease: Secondary | ICD-10-CM | POA: Diagnosis present

## 2018-07-25 DIAGNOSIS — Z7902 Long term (current) use of antithrombotics/antiplatelets: Secondary | ICD-10-CM | POA: Diagnosis not present

## 2018-07-25 DIAGNOSIS — M109 Gout, unspecified: Secondary | ICD-10-CM | POA: Diagnosis present

## 2018-07-25 DIAGNOSIS — Z7984 Long term (current) use of oral hypoglycemic drugs: Secondary | ICD-10-CM | POA: Diagnosis not present

## 2018-07-25 DIAGNOSIS — Z9841 Cataract extraction status, right eye: Secondary | ICD-10-CM | POA: Diagnosis not present

## 2018-07-25 DIAGNOSIS — Z7401 Bed confinement status: Secondary | ICD-10-CM | POA: Diagnosis not present

## 2018-07-25 DIAGNOSIS — R262 Difficulty in walking, not elsewhere classified: Secondary | ICD-10-CM | POA: Diagnosis not present

## 2018-07-25 DIAGNOSIS — N184 Chronic kidney disease, stage 4 (severe): Secondary | ICD-10-CM | POA: Diagnosis not present

## 2018-07-25 DIAGNOSIS — N179 Acute kidney failure, unspecified: Secondary | ICD-10-CM | POA: Diagnosis present

## 2018-07-25 DIAGNOSIS — I1310 Hypertensive heart and chronic kidney disease without heart failure, with stage 1 through stage 4 chronic kidney disease, or unspecified chronic kidney disease: Secondary | ICD-10-CM | POA: Diagnosis present

## 2018-07-25 DIAGNOSIS — R52 Pain, unspecified: Secondary | ICD-10-CM | POA: Diagnosis not present

## 2018-07-25 DIAGNOSIS — Z955 Presence of coronary angioplasty implant and graft: Secondary | ICD-10-CM | POA: Diagnosis not present

## 2018-07-25 DIAGNOSIS — E785 Hyperlipidemia, unspecified: Secondary | ICD-10-CM | POA: Diagnosis present

## 2018-07-25 DIAGNOSIS — D649 Anemia, unspecified: Secondary | ICD-10-CM | POA: Diagnosis not present

## 2018-07-25 DIAGNOSIS — E1151 Type 2 diabetes mellitus with diabetic peripheral angiopathy without gangrene: Secondary | ICD-10-CM | POA: Diagnosis present

## 2018-07-25 LAB — BASIC METABOLIC PANEL
Anion gap: 7 (ref 5–15)
BUN: 19 mg/dL (ref 8–23)
CO2: 25 mmol/L (ref 22–32)
Calcium: 8.2 mg/dL — ABNORMAL LOW (ref 8.9–10.3)
Chloride: 107 mmol/L (ref 98–111)
Creatinine, Ser: 1.49 mg/dL — ABNORMAL HIGH (ref 0.44–1.00)
GFR calc Af Amer: 37 mL/min — ABNORMAL LOW (ref >60)
GFR calc non Af Amer: 32 mL/min — ABNORMAL LOW (ref >60)
Glucose, Bld: 69 mg/dL — ABNORMAL LOW (ref 70–99)
Potassium: 4.3 mmol/L (ref 3.5–5.1)
Sodium: 139 mmol/L (ref 135–145)

## 2018-07-25 LAB — CBC
HCT: 23.5 % — ABNORMAL LOW (ref 36.0–46.0)
Hemoglobin: 7.6 g/dL — ABNORMAL LOW (ref 12.0–15.0)
MCH: 28.3 pg (ref 26.0–34.0)
MCHC: 32.3 g/dL (ref 30.0–36.0)
MCV: 87.4 fL (ref 78.0–100.0)
Platelets: 338 10*3/uL (ref 150–400)
RBC: 2.69 MIL/uL — ABNORMAL LOW (ref 3.87–5.11)
RDW: 19.1 % — ABNORMAL HIGH (ref 11.5–15.5)
WBC: 4.7 10*3/uL (ref 4.0–10.5)

## 2018-07-25 LAB — TROPONIN I
Troponin I: 0.03 ng/mL (ref <0.03)
Troponin I: 0.03 ng/mL (ref <0.03)

## 2018-07-25 LAB — GLUCOSE, CAPILLARY
Glucose-Capillary: 91 mg/dL (ref 70–99)
Glucose-Capillary: 126 mg/dL — ABNORMAL HIGH (ref 70–99)
Glucose-Capillary: 82 mg/dL (ref 70–99)
Glucose-Capillary: 99 mg/dL (ref 70–99)

## 2018-07-25 MED ORDER — LIP MEDEX EX OINT
TOPICAL_OINTMENT | CUTANEOUS | Status: AC
  Filled 2018-07-25: qty 7

## 2018-07-25 MED ORDER — DIPHENHYDRAMINE HCL 25 MG PO CAPS
25.0000 mg | ORAL_CAPSULE | Freq: Four times a day (QID) | ORAL | Status: DC | PRN
  Administered 2018-07-25 (×2): 25 mg via ORAL
  Filled 2018-07-25 (×2): qty 1

## 2018-07-25 MED ORDER — AMOXICILLIN-POT CLAVULANATE 500-125 MG PO TABS
1.0000 | ORAL_TABLET | Freq: Two times a day (BID) | ORAL | Status: DC
  Administered 2018-07-25 – 2018-07-26 (×3): 500 mg via ORAL
  Filled 2018-07-25 (×5): qty 1

## 2018-07-25 MED ORDER — LIP MEDEX EX OINT
TOPICAL_OINTMENT | CUTANEOUS | Status: DC | PRN
  Administered 2018-07-25: 12:00:00 via TOPICAL

## 2018-07-25 NOTE — Progress Notes (Addendum)
Patient Demographics:    Gabrielle [name], is a 83 y.o. female, DOB - 1935-10-08, FMB:[id number]  Admit date - 07/24/2018   Admitting Physician Debbe Odea, MD  Outpatient Primary MD for the patient is System, Pcp Not In  LOS - 0   Chief Complaint  Patient presents with  . Abdominal Pain        Subjective:    Gabrielle [name] today has no fevers, no emesis,  No chest pain,  C/o pruritus after tramadol, tongue swelling, no throat swelling, no generalized rash  Assessment  & Plan :    Principal Problem:   Vomiting Active Problems:   Type 2 diabetes mellitus with hypoglycemia without coma ([place])   Hypertensive heart disease without CHF   Hyperlipidemia   Anemia due to chronic kidney disease   CKD (chronic kidney disease), stage IV ([facility])   Status post coronary artery stent placement  Brief Summary:- 83 y.o. female with medical history of recent MI, RCC with nephrectomy, CKD 4, DM2 was discharged from the hospital on 7/25 after being treated for an NSTEMI and went  to a SNF and then Readmitted on 07/24/18, found to have elevated creatinine up to 1.71 from a baseline of around 1.4   1)Dental Pain--- patient has infected tooth/dental caries, especially in the left upper jaw area, treat empirically with Augmentin, pending further referral to a dentist, patient will  probably have to hold Plavix prior to any dental work, however she had NSTEMI  this July  2)AKI superimposed on CKD stage IV-----creatinine is down to 1.494 1.7, continue to avoid nephrotoxic agents and hydrate  3)CAD--recent NSTEMI, no ACS type symptoms continue current meds aspirin, Coreg and Plavix, patient will  probably have to hold Plavix prior to any dental work, however she had NSTEMI  this July  4)DM- Use Novolog/Humalog Sliding scale insulin with Accu-Cheks/Fingersticks as ordered  5)Anemia-drop in H&H noted, check stool for occult  blood,   Code Status : full    Disposition Plan  : SNF   DVT Prophylaxis  :  Lovenox -   Lab Results  Component Value Date   PLT 338 07/25/2018    Inpatient Medications  Scheduled Meds: . amLODipine  5 mg Oral Daily  . amoxicillin-clavulanate  1 tablet Oral BID  . aspirin EC  81 mg Oral Daily  . carvedilol  3.125 mg Oral BID WC  . clopidogrel  75 mg Oral Daily  . enoxaparin (LOVENOX) injection  30 mg Subcutaneous Q24H  . insulin aspart  0-5 Units Subcutaneous QHS  . insulin aspart  0-9 Units Subcutaneous TID WC   Continuous Infusions: . sodium chloride 75 mL/hr at 07/25/18 0729   PRN Meds:.acetaminophen **OR** acetaminophen, diphenhydrAMINE, lip balm, ondansetron (ZOFRAN) IV, traMADol, zolpidem    Anti-infectives (From admission, onward)   Start     Dose/Rate Route Frequency Ordered Stop   07/25/18 1200  amoxicillin-clavulanate (AUGMENTIN) 500-125 MG per tablet 500 mg     1 tablet Oral 2 times daily 07/25/18 1102          Objective:   Vitals:   07/24/18 2020 07/24/18 2101 07/25/18 0530 07/25/18 1356  BP: (!) 145/55 (!) 139/47 (!) 143/55 (!) 109/47  Pulse:  69 65 63  Resp:  18 16 14   Temp:  98.4 F (36.9 C) 98 F (36.7 C)   TempSrc:  Oral Oral   SpO2:  100% 100% 99%  Weight:      Height:        Wt Readings from Last 3 Encounters:  07/24/18 56.2 kg (124 lb)  07/24/18 56.2 kg (124 lb)  07/19/18 56.2 kg (123 lb 14.4 oz)     Intake/Output Summary (Last 24 hours) at 07/25/2018 1821 Last data filed at 07/25/2018 1500 Gross per 24 hour  Intake 1513.66 ml  Output -  Net 1513.66 ml     Physical Exam  Gen:- Awake Alert,  In no apparent distress  HEENT:- [place].AT, No sclera icterus Mouth--- very poor dentition, dental caries, left upper premolars chipped and most likely infected, no tongue swelling Neck-Supple Neck,No JVD,.  Lungs-  CTAB , good air movement CV- S1, S2 normal Abd-  +ve B.Sounds, Abd Soft, No tenderness,    Extremity/Skin:- No  edema,      Psych-affect is appropriate, oriented x3 Neuro-no new focal deficits, no tremors   Data Review:   Micro Results No results found for this or any previous visit (from the past 240 hour(s)).  Radiology Reports Dg Chest 1 View  Result Date: 07/24/2018 CLINICAL DATA:  Left-sided ribcage discomfort associated with abdominal pain, nausea and vomiting, and weakness beginning this morning. History of coronary artery disease and previous MI, CVA, renal cell malignancy, former smoker. EXAM: CHEST  1 VIEW COMPARISON:  PA and lateral chest x-ray of July 14, 2018 FINDINGS: The lungs are mildly hyperinflated and clear. The right costophrenic angle is excluded from the study. The heart and pulmonary vascularity are normal. There is calcification in the wall of the aortic arch. The observed bony thorax is unremarkable. IMPRESSION: COPD-reactive airway disease. No pneumonia, CHF, nor other acute cardiopulmonary abnormality. Thoracic aortic atherosclerosis. Electronically Signed   By: David  Martinique M.D.   On: 07/24/2018 11:15   Dg Chest 2 View  Result Date: 07/14/2018 CLINICAL DATA:  Chest pain and shortness of breath. EXAM: CHEST - 2 VIEW COMPARISON:  07/10/2017 FINDINGS: The cardiac silhouette, mediastinal and hilar contours are within normal limits and stable. There is tortuosity and dense calcification of the thoracic aorta. The lungs are clear. No pleural effusions. The bony thorax is intact. IMPRESSION: No acute cardiopulmonary findings. Electronically Signed   By: Marijo Sanes M.D.   On: 07/14/2018 09:23   Ct Head Wo Contrast  Result Date: 07/24/2018 CLINICAL DATA:  Ataxia EXAM: CT HEAD WITHOUT CONTRAST TECHNIQUE: Contiguous axial images were obtained from the base of the skull through the vertex without intravenous contrast. COMPARISON:  MRI 07/18/2018 FINDINGS: Brain: Previously seen right cerebral and cerebellar infarcts are not visible by CT. No visible acute infarction or hemorrhage. No  hydrocephalus. Moderate chronic small vessel disease changes throughout the deep white matter. Vascular: No hyperdense vessel or unexpected calcification. Skull: No acute calvarial abnormality. Sinuses/Orbits: Visualized paranasal sinuses and mastoids clear. Orbital soft tissues unremarkable. Other: None IMPRESSION: Previously seen small scattered right cerebral and cerebellar infarcts not visible by CT. No new infarction seen. Moderate chronic small vessel disease. Electronically Signed   By: Rolm Baptise M.D.   On: 07/24/2018 11:10   Mr Brain Wo Contrast  Result Date: 07/18/2018 CLINICAL DATA:  Perioral numbness since yesterday, RIGHT-sided weakness. History of renal cell cancer, carotid artery disease, hypertension, hyperlipidemia, seizure. EXAM: MRI HEAD WITHOUT CONTRAST TECHNIQUE: Multiplanar, multiecho pulse sequences of the brain and surrounding structures  were obtained without intravenous contrast. COMPARISON:  CT HEAD July 10, 2018 MRI head October 14, 2017 FINDINGS: INTRACRANIAL CONTENTS: Subcentimeter reduced diffusion RIGHT cerebellum, additional subcentimeter foci reduced diffusion RIGHT frontal and RIGHT occipital lobes. Identifiable lesions demonstrate low ADC values. Confluent supratentorial and pontine white matter FLAIR T2 hyperintensities. Prominent basal ganglia and thalami perivascular spaces associated chronic small vessel ischemic changes. No parenchymal brain volume loss for age. LEFT anterior temporal pole encephalomalacia. No hydrocephalus. No midline shift, mass effect or masses. No abnormal extra-axial fluid collections. VASCULAR: Normal major intracranial vascular flow voids present at skull base. SKULL AND UPPER CERVICAL SPINE: No abnormal sellar expansion. No suspicious calvarial bone marrow signal. Craniocervical junction maintained. SINUSES/ORBITS: Chronic RIGHT sphenoid sinusitis. Included ocular globes and orbital contents are non-suspicious. OTHER: None. IMPRESSION: 1.  Multiple acute small RIGHT cerebrum and RIGHT cerebellar nonhemorrhagic infarcts. 2. Severe white matter changes compatible with chronic small vessel ischemic changes/chronic hypertensive encephalopathy. 3. LEFT temporal lobe encephalomalacia suggesting TBI. Electronically Signed   By: Elon Alas M.D.   On: 07/18/2018 19:59   US Renal  Result Date: 07/11/2018 CLINICAL DATA:  Initial evaluation for acute renal injury. Status post right nephrectomy. EXAM: RENAL / URINARY TRACT ULTRASOUND COMPLETE COMPARISON:  None. FINDINGS: Right Kidney: Surgically absent. Left Kidney: Length: 10.4 cm. Echogenicity within normal limits. No hydronephrosis. 1.6 x 1.5 x 1.4 cm cyst present at the upper pole. Additional 1.1 x 0.8 x 1.0 cyst at the upper pole. Bladder: Appears normal for degree of bladder distention. Left jet visualized. IMPRESSION: 1. Left kidney demonstrates no hydronephrosis or other acute abnormality. 2. Left renal cysts as above. 3. Prior right nephrectomy. Electronically Signed   By: Jeannine Boga M.D.   On: 07/11/2018 23:42   Dg Hip Unilat With Pelvis 2-3 Views Right  Result Date: 07/19/2018 CLINICAL DATA:  Chronic right hip pain. Pain radiates down right leg. EXAM: DG HIP (WITH OR WITHOUT PELVIS) 2-3V RIGHT COMPARISON:  02/07/2018. FINDINGS: Degenerative changes lumbar spine and both hips. No acute bony abnormality identified. No evidence of fracture. Aortoiliac atherosclerotic vascular calcification. IMPRESSION: 1. Degenerative changes lumbar spine and both hips. No acute abnormality. 2.  Aortoiliac atherosclerotic vascular disease. Electronically Signed   By: Marcello Moores  Register   On: 07/19/2018 12:30     CBC Recent Labs  Lab 07/24/18 1158 07/25/18 0558  WBC 9.2 4.7  HGB 9.0* 7.6*  HCT 28.2* 23.5*  PLT 358 338  MCV 89.0 87.4  MCH 28.4 28.3  MCHC 31.9 32.3  RDW 18.9* 19.1*  LYMPHSABS 0.7  --   MONOABS 0.6  --   EOSABS 0.1  --   BASOSABS 0.0  --     Chemistries  Recent  Labs  Lab 07/24/18 1158 07/25/18 0558  NA 136 139  K 4.7 4.3  CL 102 107  CO2 26 25  GLUCOSE 162* 69*  BUN 20 19  CREATININE 1.71* 1.49*  CALCIUM 8.8* 8.2*  AST 17  --   ALT 20  --   ALKPHOS 70  --   BILITOT 0.8  --    ------------------------------------------------------------------------------------------------------------------ No results for input(s): CHOL, HDL, LDLCALC, TRIG, CHOLHDL, LDLDIRECT in the last 72 hours.  Lab Results  Component Value Date   HGBA1C 5.6 07/14/2018   ------------------------------------------------------------------------------------------------------------------ No results for input(s): TSH, T4TOTAL, T3FREE, THYROIDAB in the last 72 hours.  Invalid input(s): FREET3 ------------------------------------------------------------------------------------------------------------------ No results for input(s): VITAMINB12, FOLATE, FERRITIN, TIBC, IRON, RETICCTPCT in the last 72 hours.  Coagulation profile Recent Labs  Lab  07/24/18 1158  INR 0.99    No results for input(s): DDIMER in the last 72 hours.  Cardiac Enzymes Recent Labs  Lab 07/24/18 1750 07/25/18 0022 07/25/18 0558  TROPONINI 0.03* 0.03* 0.03*   ------------------------------------------------------------------------------------------------------------------ No results found for: BNP   Roxan Hockey M.D on 07/25/2018 at 6:21 PM   Go to [email or web address] - password [email or web address] for contact info  [facility] - Office  [phone]

## 2018-07-25 NOTE — Evaluation (Signed)
Physical Therapy Evaluation Patient Details Name: Gabrielle [name] MRN: [id number] DOB: 04-08-1935 Today's Date: 07/25/2018   History of Present Illness  Pt admitted 2* weakness/nausea and with hx of recent NSTEMI as well as DM, PAD, CVA, CAD, L nephrectomy, and back surgery  Clinical Impression  Pt admitted as above and presenting with functional mobility limitations 2* generalized weakness and balance deficits.  Pt would benefit from follow up care at previous SNF.    Follow Up Recommendations SNF    Equipment Recommendations  None recommended by PT    Recommendations for Other Services       Precautions / Restrictions Precautions Precautions: Fall Restrictions Weight Bearing Restrictions: No      Mobility  Bed Mobility Overal bed mobility: Needs Assistance Bed Mobility: Supine to Sit     Supine to sit: Supervision;HOB elevated     General bed mobility comments: increased time and effort, to/from R side of bed  Transfers Overall transfer level: Needs assistance Equipment used: 1 person hand held assist;Rolling walker (2 wheeled) Transfers: Sit to/from Stand Sit to Stand: Min guard         General transfer comment: steady assist  Ambulation/Gait Ambulation/Gait assistance: Min assist;Min guard Gait Distance (Feet): 40 Feet Assistive device: Rolling walker (2 wheeled) Gait Pattern/deviations: Step-to pattern;Step-through pattern;Decreased stride length;Wide base of support;Trunk flexed Gait velocity: reduced   General Gait Details: cues for posture and position from RW.  Pt with increased difficulty advancing R LE with toes dragging slightly  Stairs            Wheelchair Mobility    Modified Rankin (Stroke Patients Only)       Balance Overall balance assessment: Needs assistance Sitting-balance support: Feet supported Sitting balance-Leahy Scale: Good     Standing balance support: Bilateral upper extremity supported;During functional  activity Standing balance-Leahy Scale: Fair                               Pertinent Vitals/Pain Pain Assessment: No/denies pain    Home Living Family/patient expects to be discharged to:: Skilled nursing facility                      Prior Function Level of Independence: Independent with assistive device(s)         Comments: Pt states used RW,cane as needed     Hand Dominance   Dominant Hand: Right    Extremity/Trunk Assessment   Upper Extremity Assessment Upper Extremity Assessment: Generalized weakness    Lower Extremity Assessment Lower Extremity Assessment: Generalized weakness;RLE deficits/detail RLE Deficits / Details: Pt states numbness R foot and with noted DF strength 3/5 RLE Sensation: decreased light touch       Communication   Communication: No difficulties  Cognition Arousal/Alertness: Awake/alert Behavior During Therapy: WFL for tasks assessed/performed Overall Cognitive Status: Within Functional Limits for tasks assessed                         Following Commands: Follows multi-step commands consistently              General Comments      Exercises     Assessment/Plan    PT Assessment Patient needs continued PT services  PT Problem List Cardiopulmonary status limiting activity;Decreased strength;Decreased balance;Decreased activity tolerance;Decreased mobility;Decreased knowledge of use of DME       PT Treatment Interventions DME instruction;Gait training;Functional mobility  training;Therapeutic activities;Therapeutic exercise;Balance training;Patient/family education    PT Goals (Current goals can be found in the Care Plan section)  Acute Rehab PT Goals Patient Stated Goal: Regain IND PT Goal Formulation: With patient Time For Goal Achievement: 08/08/18 Potential to Achieve Goals: Fair    Frequency Min 3X/week   Barriers to discharge        Co-evaluation               AM-PAC PT "6  Clicks" Daily Activity  Outcome Measure Difficulty turning over in bed (including adjusting bedclothes, sheets and blankets)?: A Little Difficulty moving from lying on back to sitting on the side of the bed? : A Lot Difficulty sitting down on and standing up from a chair with arms (e.g., wheelchair, bedside commode, etc,.)?: A Lot Help needed moving to and from a bed to chair (including a wheelchair)?: A Little Help needed walking in hospital room?: A Little Help needed climbing 3-5 steps with a railing? : A Lot 6 Click Score: 15    End of Session Equipment Utilized During Treatment: Gait belt Activity Tolerance: Patient tolerated treatment well;Patient limited by fatigue Patient left: in chair;with call bell/phone within reach;with chair alarm set Nurse Communication: Mobility status PT Visit Diagnosis: Unsteadiness on feet (R26.81);Muscle weakness (generalized) (M62.81);Difficulty in walking, not elsewhere classified (R26.2)    Time: 3112-1624 PT Time Calculation (min) (ACUTE ONLY): 27 min   Charges:   PT Evaluation $PT Eval Low Complexity: 1 Low PT Treatments $Gait Training: 8-22 mins        Pg [phone]   Mikalyn Hermida 07/25/2018, 3:26 PM

## 2018-07-25 NOTE — Progress Notes (Signed)
The PTA med list has been updated using paperwork send by [facility]. The paperwork did not show the last time each medication was administered. The pharmacy tech attempted to call them four times but was unable to get this information.   Romeo Rabon, PharmD. Mobile: [phone]. 07/25/2018,8:42 AM.

## 2018-07-25 NOTE — Care Management Obs Status (Signed)
[facility] NOTIFICATION   Patient Details  Name: Gabrielle [name] MRN: [id number] Date of Birth: [birth year]   Medicare Observation Status Notification Given:  Yes    Leeroy Cha, RN 07/25/2018, 11:11 AM

## 2018-07-25 NOTE — Telephone Encounter (Signed)
Pt was seen in the office on 07/24/18 and is now currently admitted.

## 2018-07-26 DIAGNOSIS — K5900 Constipation, unspecified: Secondary | ICD-10-CM | POA: Diagnosis not present

## 2018-07-26 DIAGNOSIS — M6281 Muscle weakness (generalized): Secondary | ICD-10-CM | POA: Diagnosis not present

## 2018-07-26 DIAGNOSIS — Z955 Presence of coronary angioplasty implant and graft: Secondary | ICD-10-CM | POA: Diagnosis not present

## 2018-07-26 DIAGNOSIS — E119 Type 2 diabetes mellitus without complications: Secondary | ICD-10-CM | POA: Diagnosis not present

## 2018-07-26 DIAGNOSIS — R112 Nausea with vomiting, unspecified: Secondary | ICD-10-CM | POA: Diagnosis not present

## 2018-07-26 DIAGNOSIS — Z23 Encounter for immunization: Secondary | ICD-10-CM | POA: Diagnosis not present

## 2018-07-26 DIAGNOSIS — I214 Non-ST elevation (NSTEMI) myocardial infarction: Secondary | ICD-10-CM | POA: Diagnosis not present

## 2018-07-26 DIAGNOSIS — D649 Anemia, unspecified: Secondary | ICD-10-CM | POA: Diagnosis not present

## 2018-07-26 DIAGNOSIS — K0889 Other specified disorders of teeth and supporting structures: Secondary | ICD-10-CM | POA: Diagnosis not present

## 2018-07-26 DIAGNOSIS — M2550 Pain in unspecified joint: Secondary | ICD-10-CM | POA: Diagnosis not present

## 2018-07-26 DIAGNOSIS — I10 Essential (primary) hypertension: Secondary | ICD-10-CM | POA: Diagnosis not present

## 2018-07-26 DIAGNOSIS — J302 Other seasonal allergic rhinitis: Secondary | ICD-10-CM | POA: Diagnosis not present

## 2018-07-26 DIAGNOSIS — Z7401 Bed confinement status: Secondary | ICD-10-CM | POA: Diagnosis not present

## 2018-07-26 DIAGNOSIS — R52 Pain, unspecified: Secondary | ICD-10-CM | POA: Diagnosis not present

## 2018-07-26 DIAGNOSIS — R262 Difficulty in walking, not elsewhere classified: Secondary | ICD-10-CM | POA: Diagnosis not present

## 2018-07-26 DIAGNOSIS — Z111 Encounter for screening for respiratory tuberculosis: Secondary | ICD-10-CM | POA: Diagnosis not present

## 2018-07-26 DIAGNOSIS — E785 Hyperlipidemia, unspecified: Secondary | ICD-10-CM | POA: Diagnosis not present

## 2018-07-26 DIAGNOSIS — N184 Chronic kidney disease, stage 4 (severe): Secondary | ICD-10-CM

## 2018-07-26 DIAGNOSIS — K029 Dental caries, unspecified: Secondary | ICD-10-CM | POA: Diagnosis not present

## 2018-07-26 DIAGNOSIS — L299 Pruritus, unspecified: Secondary | ICD-10-CM | POA: Diagnosis not present

## 2018-07-26 DIAGNOSIS — I2510 Atherosclerotic heart disease of native coronary artery without angina pectoris: Secondary | ICD-10-CM | POA: Diagnosis not present

## 2018-07-26 DIAGNOSIS — N179 Acute kidney failure, unspecified: Secondary | ICD-10-CM | POA: Diagnosis not present

## 2018-07-26 LAB — GLUCOSE, CAPILLARY
Glucose-Capillary: 81 mg/dL (ref 70–99)
Glucose-Capillary: 224 mg/dL — ABNORMAL HIGH (ref 70–99)
Glucose-Capillary: 169 mg/dL — ABNORMAL HIGH (ref 70–99)

## 2018-07-26 LAB — CBC
HCT: 25.7 % — ABNORMAL LOW (ref 36.0–46.0)
Hemoglobin: 8.1 g/dL — ABNORMAL LOW (ref 12.0–15.0)
MCH: 28.1 pg (ref 26.0–34.0)
MCHC: 31.5 g/dL (ref 30.0–36.0)
MCV: 89.2 fL (ref 78.0–100.0)
Platelets: 370 10*3/uL (ref 150–400)
RBC: 2.88 MIL/uL — ABNORMAL LOW (ref 3.87–5.11)
RDW: 19.2 % — ABNORMAL HIGH (ref 11.5–15.5)
WBC: 4.7 10*3/uL (ref 4.0–10.5)

## 2018-07-26 LAB — BASIC METABOLIC PANEL
Anion gap: 6 (ref 5–15)
BUN: 16 mg/dL (ref 8–23)
CO2: 27 mmol/L (ref 22–32)
Calcium: 8.4 mg/dL — ABNORMAL LOW (ref 8.9–10.3)
Chloride: 107 mmol/L (ref 98–111)
Creatinine, Ser: 1.65 mg/dL — ABNORMAL HIGH (ref 0.44–1.00)
GFR calc Af Amer: 33 mL/min — ABNORMAL LOW (ref >60)
GFR calc non Af Amer: 28 mL/min — ABNORMAL LOW (ref >60)
Glucose, Bld: 84 mg/dL (ref 70–99)
Potassium: 4.7 mmol/L (ref 3.5–5.1)
Sodium: 140 mmol/L (ref 135–145)

## 2018-07-26 MED ORDER — INSULIN ASPART 100 UNIT/ML ~~LOC~~ SOLN: 0.0000 [IU] | Freq: Three times a day (TID) | SUBCUTANEOUS | 11 refills | Status: AC

## 2018-07-26 MED ORDER — AMOXICILLIN-POT CLAVULANATE 500-125 MG PO TABS: 1.0000 | ORAL_TABLET | Freq: Two times a day (BID) | ORAL | 0 refills | Status: AC

## 2018-07-26 MED ORDER — ATORVASTATIN CALCIUM 40 MG PO TABS: 40.0000 mg | ORAL_TABLET | Freq: Every day | ORAL | Status: DC

## 2018-07-26 MED ORDER — ACETAMINOPHEN 325 MG PO TABS: 650.0000 mg | ORAL_TABLET | Freq: Four times a day (QID) | ORAL | 0 refills | Status: AC | PRN

## 2018-07-26 MED ORDER — LACTINEX PO CHEW: 1.0000 | CHEWABLE_TABLET | Freq: Three times a day (TID) | ORAL | 0 refills | Status: AC

## 2018-07-26 MED ORDER — DIPHENHYDRAMINE HCL 25 MG PO CAPS: 25.0000 mg | ORAL_CAPSULE | Freq: Four times a day (QID) | ORAL | 0 refills | Status: AC | PRN

## 2018-07-26 MED ORDER — ONDANSETRON 4 MG PO TBDP: 4.0000 mg | ORAL_TABLET | Freq: Three times a day (TID) | ORAL | 0 refills | Status: AC | PRN

## 2018-07-26 MED ORDER — TRAMADOL HCL 50 MG PO TABS: 50.0000 mg | ORAL_TABLET | Freq: Four times a day (QID) | ORAL | 0 refills | Status: AC | PRN

## 2018-07-26 NOTE — Discharge Instructions (Signed)
1)You are taking Aspirin and Plavix for your Heart/Stents so Avoid ibuprofen/Advil/Aleve/Motrin/Goody Powders/Naproxen/BC powders/Meloxicam/Diclofenac/Indomethacin and other Nonsteroidal anti-inflammatory medications as these will make you more likely to bleed and can cause stomach ulcers, can also cause Kidney problems.   2)Take Augmentin for tooth infection as prescribed  3)Follow-up with your dentist as an outpatient in 1 to 2 weeks  4)You probably will not be able to come off your aspirin and Plavix at least for the next couple months due to a recent stents that was placed on 07/16/2018, so if you dentist needs you to be off Plavix and aspirin to allow for dental work this may not be feasible for now  5) patient has chronic anemia, recheck CBC within a week, check stool occult blood as patient is on on aspirin and Plavix  6) check BMP within a week due to underlying chronic kidney dysfunction

## 2018-07-26 NOTE — Discharge Summary (Signed)
Gabrielle [name], is a 83 y.o. female  DOB [birth year]  MRN [id number].  Admission date:  07/24/2018  Admitting Physician  Debbe Odea, MD  Discharge Date:  07/26/2018   Primary MD  System, Pcp Not In  Recommendations for primary care physician for things to follow:   1)You are taking Aspirin and Plavix for your Heart/Stents so Avoid ibuprofen/Advil/Aleve/Motrin/Goody Powders/Naproxen/BC powders/Meloxicam/Diclofenac/Indomethacin and other Nonsteroidal anti-inflammatory medications as these will make you more likely to bleed and can cause stomach ulcers, can also cause Kidney problems.   2)Take Augmentin for tooth infection as prescribed  3)Follow-up with your dentist as an outpatient in 1 to 2 weeks  4)You probably will not be able to come off your aspirin and Plavix at least for the next couple months due to a recent stents that was placed on 07/16/2018, so if you dentist needs you to be off Plavix and aspirin to allow for dental work this may not be feasible for now  5) patient has chronic anemia, recheck CBC within a week, check stool occult blood as patient is on on aspirin and Plavix  6) check BMP within a week due to underlying chronic kidney dysfunction   Admission Diagnosis  Nausea and vomiting, intractability of vomiting not specified, unspecified vomiting type [R11.2]   Discharge Diagnosis  Nausea and vomiting, intractability of vomiting not specified, unspecified vomiting type [R11.2]    Principal Problem:   Vomiting Active Problems:   Type 2 diabetes mellitus with hypoglycemia without coma ([place])   Hypertensive heart disease without CHF   Hyperlipidemia   Anemia due to chronic kidney disease   CKD (chronic kidney disease), stage IV ([place])   Status post coronary artery stent placement      Past Medical History:  Diagnosis Date  . Anemia    hx  . Anxiety   . Arthritis   . Azotemia     . Carotid artery disease ([place])   . Claudication ([place])   . Coronary artery disease   . CVA (cerebral vascular accident) ([place]) 2008   LOC DIZZINESS ; "mini stroke" (07/12/2018)  . Depression   . Essential hypertension, benign   . History of gout   . History of kidney stones   . Hyperlipidemia   . Migraine    "years ago; due to vision issues; got glasses & they went away" (07/12/2018)  . Neck pain   . NSTEMI (non-ST elevated myocardial infarction) ([place]) 07/11/2018  . PAD (peripheral artery disease) ([facility])    hx. LCEA, hx Lt renal art. stenting, occl rt renal artery, occluded bil SFAs, moderatie iliac disease,    . Renal cell cancer ([place]) 2001   "left"  . Seizure ([place])    pt does not recall this hx on 07/12/2018  . Type II diabetes mellitus ([place]) 1990    Past Surgical History:  Procedure Laterality Date  . ABDOMINAL HYSTERECTOMY    . BACK SURGERY    . CAROTID ENDARTERECTOMY Left   . CATARACT EXTRACTION W/ INTRAOCULAR LENS  IMPLANT,  BILATERAL Bilateral   . CORONARY ATHERECTOMY N/A 07/16/2018   Procedure: CORONARY ATHERECTOMY;  Surgeon: Jettie Booze, MD;  Location: [facility];  Service: Cardiovascular;  Laterality: N/A;  . CORONARY STENT INTERVENTION  07/16/2018  . CORONARY STENT INTERVENTION N/A 07/16/2018   Procedure: CORONARY STENT INTERVENTION;  Surgeon: Jettie Booze, MD;  Location: [facility];  Service: Cardiovascular;  Laterality: N/A;  . INCISION AND DRAINAGE ABSCESS Right 10/26/2017   Procedure: INCISION AND DRAINAGE HEMATOMA RIGHT SHIN;  Surgeon: Georganna Skeans, MD;  Location: [facility];  Service: General;  Laterality: Right;  . LEFT HEART CATH AND CORONARY ANGIOGRAPHY N/A 07/13/2018   Procedure: LEFT HEART CATH AND CORONARY ANGIOGRAPHY;  Surgeon: Jettie Booze, MD;  Location: [facility];  Service: Cardiovascular;  Laterality: N/A;  . LEFT HEART CATH AND CORONARY ANGIOGRAPHY N/A 07/16/2018   Procedure: LEFT HEART CATH AND CORONARY  ANGIOGRAPHY;  Surgeon: Jettie Booze, MD;  Location: [facility];  Service: Cardiovascular;  Laterality: N/A;  . LUMBAR [place]    . NEPHRECTOMY Right   . NM MYOCAR PERF WALL MOTION  07/12/2011   normal  . PV angiogram  2004   Lt renal artery stent  . TEMPORARY PACEMAKER N/A 07/16/2018   Procedure: TEMPORARY PACEMAKER;  Surgeon: Jettie Booze, MD;  Location: [facility];  Service: Cardiovascular;  Laterality: N/A;  . TONSILLECTOMY    . URETERAL STENT PLACEMENT Left        HPI  from the history and physical done on the day of admission:    Chief Complaint: vomiting  HPI: Gabrielle [name] is a 83 y.o. female with medical history of recent MI, RCC with nephrectomy, CKD 4, DM2 was discharged from the hospital on 7/25 after being treated for an NSTEMI and when to a SNF. She presents back to the hospital for 3 episodes of vomiting starting last night. She is unable to keep down solids and liquids. No abdominal pain, diarrhea or fevers. She has pain in her left jaw due to a few bad teeth and the facility she is in has still not made her and appt with the dentist. She is upset by this.  She has other aches and pains as well in various parts of her body and thinks she was given a pain medication at the facility but nothing has helped her pain.     Hospital Course:   Brief Summary:- 83 y.o.femalewith medical history ofrecent MI, RCC with nephrectomy, CKD 4, DM2 was discharged from the hospital on 7/25 after being treated for an NSTEMI and went  to a SNF and then Readmitted on 07/24/18,    Plan:- 1)Dental Pain--- patient has infected tooth/dental caries, especially in the left upper jaw area, treat empirically with Augmentin, pending further referral to a dentist, robably will not be able to come off your aspirin and Plavix at least for the next couple months due to a recent stents that was placed on 07/16/2018, so if you dentist needs you to be off Plavix and  aspirin to allow for dental work this may not be feasible for now  2)AKI superimposed on CKD stage IV-----creatinine is between 1.5-1.7, continue to avoid nephrotoxic agents , maintain adequate hydration, recheck BMP in a week  3)CAD--recent NSTEMI, no ACS type symptoms continue current meds aspirin, Coreg and Plavix, patient will  probably have to hold Plavix prior to any dental work, however she had NSTEMI  this July  4)DM-glipizide as prescribed, Use Novolog/Humalog Sliding scale insulin with Accu-Cheks/Fingersticks as ordered  5) chronic anemia-hemoglobin is down to 8.1 with hydration, suspect some degree of hemodilution, no acute bleeding noted, recheck CBC within a week, stool occult blood while on aspirin and Plavix    Code Status : full   Disposition Plan  : SNF  Discharge Condition: stable  Follow UP- dentist   Diet and Activity recommendation:  As advised  Discharge Instructions     Discharge Instructions    Call MD for:  difficulty breathing, headache or visual disturbances   Complete by:  As directed    Call MD for:  persistant dizziness or light-headedness   Complete by:  As directed    Call MD for:  persistant nausea and vomiting   Complete by:  As directed    Call MD for:  severe uncontrolled pain   Complete by:  As directed    Call MD for:  temperature >100.4   Complete by:  As directed    Diet - low sodium heart healthy   Complete by:  As directed    Discharge instructions   Complete by:  As directed    1)You are taking Aspirin and Plavix for your Heart/Stents so Avoid ibuprofen/Advil/Aleve/Motrin/Goody Powders/Naproxen/BC powders/Meloxicam/Diclofenac/Indomethacin and other Nonsteroidal anti-inflammatory medications as these will make you more likely to bleed and can cause stomach ulcers, can also cause Kidney problems.   2)Take Augmentin for tooth infection as prescribed  3)Follow-up with your dentist as an outpatient in 1 to 2 weeks  4)You  probably will not be able to come off your aspirin and Plavix at least for the next couple months due to a recent stents that was placed on 07/16/2018, so if you dentist needs you to be off Plavix and aspirin to allow for dental work this may not be feasible for now  5) patient has chronic anemia, recheck CBC within a week, check stool occult blood as patient is on on aspirin and Plavix  6) check BMP within a week due to underlying chronic kidney dysfunction   Increase activity slowly   Complete by:  As directed         Discharge Medications     Allergies as of 07/26/2018      Reactions   Codeine Nausea And Vomiting   Fentanyl Other (See Comments)   unknown   Lacosamide Other (See Comments)    Other name is, VIMPAT unknown   Levetiracetam Other (See Comments)   Strange feelings in head   Metformin And Related Other (See Comments)   unknown   Morphine And Related Itching   Oxybutynin Swelling   mouth   Sertraline Nausea Only, Other (See Comments)   Swelling in mouth   Tessalon [benzonatate] Other (See Comments)   unknown   Other Rash   BETA BLOCKER      Medication List    STOP taking these medications   AMBIEN 10 MG tablet Generic drug:  zolpidem     TAKE these medications   acetaminophen 500 MG tablet Commonly known as:  TYLENOL Take 1,000 mg by mouth every 6 (six) hours as needed for mild pain or fever. What changed:  Another medication with the same name was added. Make sure you understand how and when to take each.   acetaminophen 325 MG tablet Commonly known as:  TYLENOL Take 2 tablets (650 mg total) by mouth every 6 (six) hours as needed for mild pain (or Fever >/= 101). What  changed:  You were already taking a medication with the same name, and this prescription was added. Make sure you understand how and when to take each.   amLODipine 5 MG tablet Commonly known as:  NORVASC Take 5 mg by mouth daily.   amoxicillin-clavulanate 500-125 MG tablet Commonly  known as:  AUGMENTIN Take 1 tablet (500 mg total) by mouth 2 (two) times daily.   aspirin 81 MG tablet Take 81 mg by mouth daily.   atorvastatin 40 MG tablet Commonly known as:  LIPITOR Take 1 tablet (40 mg total) by mouth daily at 6 PM.   carvedilol 3.125 MG tablet Commonly known as:  COREG Take 1 tablet (3.125 mg total) by mouth 2 (two) times daily with a meal.   clopidogrel 75 MG tablet Commonly known as:  PLAVIX Take 1 tablet (75 mg total) by mouth daily.   diphenhydrAMINE 25 mg capsule Commonly known as:  BENADRYL Take 1 capsule (25 mg total) by mouth every 6 (six) hours as needed for itching, allergies or sleep (prn sleep if Ambien ineffective).   ferrous gluconate 324 MG tablet Commonly known as:  FERGON Take 1 tablet (324 mg total) by mouth daily with breakfast.   ferrous sulfate 160 (50 Fe) MG Tbcr SR tablet Commonly known as:  SLOW FE Take 1 tablet (160 mg total) by mouth daily.   glipiZIDE 5 MG tablet Commonly known as:  GLUCOTROL TAKE 1/2 TABLET BY MOUTH DAILY BEFORE BREAKFAST   insulin aspart 100 UNIT/ML injection Commonly known as:  novoLOG Inject 0-9 Units into the skin 3 (three) times daily with meals.   lactobacillus acidophilus & bulgar chewable tablet Chew 1 tablet by mouth 3 (three) times daily with meals.   nitroGLYCERIN 0.4 MG SL tablet Commonly known as:  NITROSTAT Place 1 tablet (0.4 mg total) under the tongue every 5 (five) minutes as needed for chest pain.   ondansetron 4 MG disintegrating tablet Commonly known as:  ZOFRAN ODT Take 1 tablet (4 mg total) by mouth every 8 (eight) hours as needed for nausea or vomiting.   traMADol 50 MG tablet Commonly known as:  ULTRAM Take 1 tablet (50 mg total) by mouth every 6 (six) hours as needed for moderate pain or severe pain. What changed:    how much to take  reasons to take this       Major procedures and Radiology Reports - PLEASE review detailed and final reports for all details, in  brief -   Dg Chest 1 View  Result Date: 07/24/2018 CLINICAL DATA:  Left-sided ribcage discomfort associated with abdominal pain, nausea and vomiting, and weakness beginning this morning. History of coronary artery disease and previous MI, CVA, renal cell malignancy, former smoker. EXAM: CHEST  1 VIEW COMPARISON:  PA and lateral chest x-ray of July 14, 2018 FINDINGS: The lungs are mildly hyperinflated and clear. The right costophrenic angle is excluded from the study. The heart and pulmonary vascularity are normal. There is calcification in the wall of the aortic arch. The observed bony thorax is unremarkable. IMPRESSION: COPD-reactive airway disease. No pneumonia, CHF, nor other acute cardiopulmonary abnormality. Thoracic aortic atherosclerosis. Electronically Signed   By: David  Martinique M.D.   On: 07/24/2018 11:15   Dg Chest 2 View  Result Date: 07/14/2018 CLINICAL DATA:  Chest pain and shortness of breath. EXAM: CHEST - 2 VIEW COMPARISON:  07/10/2017 FINDINGS: The cardiac silhouette, mediastinal and hilar contours are within normal limits and stable. There is tortuosity and dense calcification  of the thoracic aorta. The lungs are clear. No pleural effusions. The bony thorax is intact. IMPRESSION: No acute cardiopulmonary findings. Electronically Signed   By: Marijo Sanes M.D.   On: 07/14/2018 09:23   Ct Head Wo Contrast  Result Date: 07/24/2018 CLINICAL DATA:  Ataxia EXAM: CT HEAD WITHOUT CONTRAST TECHNIQUE: Contiguous axial images were obtained from the base of the skull through the vertex without intravenous contrast. COMPARISON:  MRI 07/18/2018 FINDINGS: Brain: Previously seen right cerebral and cerebellar infarcts are not visible by CT. No visible acute infarction or hemorrhage. No hydrocephalus. Moderate chronic small vessel disease changes throughout the deep white matter. Vascular: No hyperdense vessel or unexpected calcification. Skull: No acute calvarial abnormality. Sinuses/Orbits: Visualized  paranasal sinuses and mastoids clear. Orbital soft tissues unremarkable. Other: None IMPRESSION: Previously seen small scattered right cerebral and cerebellar infarcts not visible by CT. No new infarction seen. Moderate chronic small vessel disease. Electronically Signed   By: Rolm Baptise M.D.   On: 07/24/2018 11:10   Mr Brain Wo Contrast  Result Date: 07/18/2018 CLINICAL DATA:  Perioral numbness since yesterday, RIGHT-sided weakness. History of renal cell cancer, carotid artery disease, hypertension, hyperlipidemia, seizure. EXAM: MRI HEAD WITHOUT CONTRAST TECHNIQUE: Multiplanar, multiecho pulse sequences of the brain and surrounding structures were obtained without intravenous contrast. COMPARISON:  CT HEAD July 10, 2018 MRI head October 14, 2017 FINDINGS: INTRACRANIAL CONTENTS: Subcentimeter reduced diffusion RIGHT cerebellum, additional subcentimeter foci reduced diffusion RIGHT frontal and RIGHT occipital lobes. Identifiable lesions demonstrate low ADC values. Confluent supratentorial and pontine white matter FLAIR T2 hyperintensities. Prominent basal ganglia and thalami perivascular spaces associated chronic small vessel ischemic changes. No parenchymal brain volume loss for age. LEFT anterior temporal pole encephalomalacia. No hydrocephalus. No midline shift, mass effect or masses. No abnormal extra-axial fluid collections. VASCULAR: Normal major intracranial vascular flow voids present at skull base. SKULL AND UPPER CERVICAL SPINE: No abnormal sellar expansion. No suspicious calvarial bone marrow signal. Craniocervical junction maintained. SINUSES/ORBITS: Chronic RIGHT sphenoid sinusitis. Included ocular globes and orbital contents are non-suspicious. OTHER: None. IMPRESSION: 1. Multiple acute small RIGHT cerebrum and RIGHT cerebellar nonhemorrhagic infarcts. 2. Severe white matter changes compatible with chronic small vessel ischemic changes/chronic hypertensive encephalopathy. 3. LEFT temporal lobe  encephalomalacia suggesting TBI. Electronically Signed   By: Elon Alas M.D.   On: 07/18/2018 19:59   US Renal  Result Date: 07/11/2018 CLINICAL DATA:  Initial evaluation for acute renal injury. Status post right nephrectomy. EXAM: RENAL / URINARY TRACT ULTRASOUND COMPLETE COMPARISON:  None. FINDINGS: Right Kidney: Surgically absent. Left Kidney: Length: 10.4 cm. Echogenicity within normal limits. No hydronephrosis. 1.6 x 1.5 x 1.4 cm cyst present at the upper pole. Additional 1.1 x 0.8 x 1.0 cyst at the upper pole. Bladder: Appears normal for degree of bladder distention. Left jet visualized. IMPRESSION: 1. Left kidney demonstrates no hydronephrosis or other acute abnormality. 2. Left renal cysts as above. 3. Prior right nephrectomy. Electronically Signed   By: Jeannine Boga M.D.   On: 07/11/2018 23:42   Dg Hip Unilat With Pelvis 2-3 Views Right  Result Date: 07/19/2018 CLINICAL DATA:  Chronic right hip pain. Pain radiates down right leg. EXAM: DG HIP (WITH OR WITHOUT PELVIS) 2-3V RIGHT COMPARISON:  02/07/2018. FINDINGS: Degenerative changes lumbar spine and both hips. No acute bony abnormality identified. No evidence of fracture. Aortoiliac atherosclerotic vascular calcification. IMPRESSION: 1. Degenerative changes lumbar spine and both hips. No acute abnormality. 2.  Aortoiliac atherosclerotic vascular disease. Electronically Signed   By: Marcello Moores  Register   On: 07/19/2018 12:30    Micro Results   No results found for this or any previous visit (from the past 240 hour(s)).     Today   Subjective    Gabrielle [name] today has no new complaints, tolerated tramadol well today without any significant side effects          Patient has been seen and examined prior to discharge   Objective   Blood pressure (!) 139/37, pulse 71, temperature 98.1 F (36.7 C), temperature source Oral, resp. rate 16, height 5\' 1"  (1.549 m), weight 56.2 kg (124 lb), SpO2 99 %.   Intake/Output  Summary (Last 24 hours) at 07/26/2018 1511 Last data filed at 07/26/2018 1300 Gross per 24 hour  Intake 2080 ml  Output 1500 ml  Net 580 ml    Exam  Gen:- Awake Alert,  In no apparent distress  HEENT:- [place].AT, No sclera icterus Mouth--- very poor dentition, dental caries, left upper premolars chipped and most likely infected, no tongue swelling Neck-Supple Neck,No JVD,.  Lungs-  CTAB , good air movement CV- S1, S2 normal Abd-  +ve B.Sounds, Abd Soft, No tenderness,    Extremity/Skin:- No  edema,    Psych-affect is appropriate, oriented x3 Neuro-no new focal deficits, no tremors     Data Review   CBC w Diff:  Lab Results  Component Value Date   WBC 4.7 07/26/2018   HGB 8.1 (L) 07/26/2018   HGB 11.7 12/18/2017   HGB 11.9 07/18/2012   HCT 25.7 (L) 07/26/2018   HCT 36.0 12/18/2017   HCT 34.6 (L) 07/18/2012   PLT 370 07/26/2018   PLT 431 (H) 12/18/2017   LYMPHOPCT 7 07/24/2018   LYMPHOPCT 13.6 (L) 07/18/2012   MONOPCT 6 07/24/2018   MONOPCT 7.9 07/18/2012   EOSPCT 1 07/24/2018   EOSPCT 0.3 07/18/2012   BASOPCT 0 07/24/2018   BASOPCT 0.3 07/18/2012    CMP:  Lab Results  Component Value Date   NA 140 07/26/2018   NA 142 11/27/2017   K 4.7 07/26/2018   CL 107 07/26/2018   CO2 27 07/26/2018   BUN 16 07/26/2018   BUN 19 11/27/2017   CREATININE 1.65 (H) 07/26/2018   CREATININE 1.78 (H) 08/06/2016   GLU 131 10/12/2011   PROT 7.0 07/24/2018   PROT 6.6 09/25/2017   ALBUMIN 3.6 07/24/2018   ALBUMIN 4.4 09/25/2017   BILITOT 0.8 07/24/2018   BILITOT 0.3 09/25/2017   ALKPHOS 70 07/24/2018   AST 17 07/24/2018   ALT 20 07/24/2018  .   Total Discharge time is about 33 minutes  Roxan Hockey M.D on 07/26/2018 at 3:11 PM   Go to [email or web address] - password [email or web address] for contact info  [facility] - Office  [phone]

## 2018-07-26 NOTE — NC FL2 (Signed)
[place] LEVEL OF CARE SCREENING TOOL     IDENTIFICATION  Patient Name: Gabrielle [name] Birthdate: 06-02-1935 Sex: female Admission Date (Current Location): 07/24/2018  [facility] and [place] Number:  Herbalist and Address:  [facility], [place] [address], [place], [place] [address]      Provider Number: [id number]  Attending Physician Name and Address:  Roxan Hockey, MD  Relative Name and Phone Number:       Current Level of Care: Hospital Recommended Level of Care: [place] Prior Approval Number:    Date Approved/Denied:   PASRR Number: [id number]  Discharge Plan: SNF    Current Diagnoses: Patient Active Problem List   Diagnosis Date Noted  . Vomiting 07/24/2018  . Chest pain   . Status post coronary artery stent placement   . CAD (coronary artery disease), native coronary artery 07/15/2018  . AKI (acute kidney injury) ([place])   . CKD (chronic kidney disease), stage IV ([place])   . NSTEMI (non-ST elevated myocardial infarction) ([place]) 07/11/2018  . Anemia due to chronic kidney disease 07/11/2018  . Iron deficiency anemia 07/08/2016  . Chronic insomnia 12/10/2015  . Spinal stenosis of lumbar region 05/21/2014  . Carotid artery disease ([place]) 02/26/2014  . PAD (peripheral artery disease) ([place])   . Renal cell cancer ([place])   . Type 2 diabetes mellitus with hypoglycemia without coma ([place])   . Hypertensive heart disease without CHF   . Hyperlipidemia   . History of anemia   . CVA (cerebral vascular accident) ([place])     Orientation RESPIRATION BLADDER Height & Weight     Self, Time, Situation, Place  Normal Continent Weight: 124 lb (56.2 kg) Height:  5\' 1"  (154.9 cm)  BEHAVIORAL SYMPTOMS/MOOD NEUROLOGICAL BOWEL NUTRITION STATUS      Continent Diet(See dc Summary)  AMBULATORY STATUS COMMUNICATION OF NEEDS Skin   Extensive Assist Verbally Normal                       Personal Care Assistance Level  of Assistance    Bathing Assistance: Limited assistance Feeding assistance: Independent Dressing Assistance: Limited assistance     Functional Limitations Info  Sight, Hearing, Speech Sight Info: Adequate Hearing Info: Adequate Speech Info: Adequate    SPECIAL CARE FACTORS FREQUENCY  PT (By licensed PT), OT (By licensed OT)     PT Frequency: 5x/Week OT Frequency: 5x/week            Contractures Contractures Info: Not present    Additional Factors Info  Code Status, Allergies Code Status Info: Full Allergies Info: Amlodipine, Codeine, Coreg Carvedilol, Fentanyl, Lacosamide, Levetiracetam, Metformin And Related, Metoprolol, Morphine And Related, Oxybutynin, Sertraline, Tessalon Benzonatate, Other           Current Medications (07/26/2018):  This is the current hospital active medication list Current Facility-Administered Medications  Medication Dose Route Frequency Provider Last Rate Last Dose  . 0.9 %  sodium chloride infusion   Intravenous Continuous Debbe Odea, MD 75 mL/hr at 07/25/18 2036    . acetaminophen (TYLENOL) tablet 650 mg  650 mg Oral Q6H PRN Debbe Odea, MD       Or  . acetaminophen (TYLENOL) suppository 650 mg  650 mg Rectal Q6H PRN Debbe Odea, MD      . amLODipine (NORVASC) tablet 5 mg  5 mg Oral Daily Debbe Odea, MD   5 mg at 07/26/18 0923  . amoxicillin-clavulanate (AUGMENTIN) 500-125 MG per  tablet 500 mg  1 tablet Oral BID Roxan Hockey, MD   Stopped at 07/26/18 [phone]  . aspirin EC tablet 81 mg  81 mg Oral Daily Debbe Odea, MD   81 mg at 07/26/18 0923  . atorvastatin (LIPITOR) tablet 40 mg  40 mg Oral q1800 Emokpae, Courage, MD      . carvedilol (COREG) tablet 3.125 mg  3.125 mg Oral BID WC Debbe Odea, MD   3.125 mg at 07/26/18 0923  . clopidogrel (PLAVIX) tablet 75 mg  75 mg Oral Daily Debbe Odea, MD   75 mg at 07/26/18 0923  . diphenhydrAMINE (BENADRYL) capsule 25 mg  25 mg Oral Q6H PRN Roxan Hockey, MD   25 mg at 07/25/18 2037   . enoxaparin (LOVENOX) injection 30 mg  30 mg Subcutaneous Q24H Debbe Odea, MD   30 mg at 07/25/18 2244  . insulin aspart (novoLOG) injection 0-5 Units  0-5 Units Subcutaneous QHS Rizwan, Saima, MD      . insulin aspart (novoLOG) injection 0-9 Units  0-9 Units Subcutaneous TID WC Debbe Odea, MD   1 Units at 07/25/18 1212  . lip balm (CARMEX) ointment   Topical PRN Emokpae, Courage, MD      . ondansetron (ZOFRAN) injection 4 mg  4 mg Intravenous Q6H PRN Rizwan, Eunice Blase, MD      . traMADol (ULTRAM) tablet 50 mg  50 mg Oral Q6H PRN Debbe Odea, MD   50 mg at 07/25/18 2033  . zolpidem (AMBIEN) tablet 5 mg  5 mg Oral QHS PRN Debbe Odea, MD   5 mg at 07/25/18 2243     Discharge Medications: Please see discharge summary for a list of discharge medications.  Relevant Imaging Results:  Relevant Lab Results:   Additional Information SS#: [id number]  Servando Snare, LCSW

## 2018-07-26 NOTE — Clinical Social Work Note (Signed)
Patient assessed last week. No significant changes since dc. Patient dc'd to [facility] for rehab. Patient went to doctors appointment and was sent to hospital.   PLAN: Patient will return to [facility] for rehab.   Carolin Coy [place] CSW [phone]   Clinical Social Work Assessment  Patient Details  Name: Gabrielle [name] MRN: [id number] Date of Birth: February 21, 1935  Date of referral:                  Reason for consult:                   Permission sought to share information with:    Permission granted to share information::     Name::        Agency::     Relationship::     Contact Information:     Housing/Transportation Living arrangements for the past 2 months:    Source of Information:    Patient Interpreter Needed:    Criminal Activity/Legal Involvement Pertinent to Current Situation/Hospitalization:    Significant Relationships:    Lives with:    Do you feel safe going back to the place where you live?    Need for family participation in patient care:     Care giving concerns:  PT recommending SNF once medically stable for discharge.   Social Worker assessment / plan:  CSW met with patient. No supports at bedside. CSW introduced role and explained that PT recommendations would be discussed. Patient agreeable to SNF. No facility preference but does not want [place] because her husband passed away there. No further concerns. CSW encouraged patient to contact CSW as needed. CSW will continue to follow patient for support and facilitate discharge to SNF once medically stable.   Employment status:    Insurance information:    PT Recommendations:    Information / Referral to community resources:     Patient/Family's Response to care:  Patient agreeable to SNF placement. Patient's daughter supportive and involved in patient's care. Patient appreciated social work intervention.  Patient/Family's Understanding of and Emotional Response to Diagnosis,  Current Treatment, and Prognosis:  Patient has a good understanding of the reason for admission and her need for rehab prior to returning home. Patient appears happy with hospital care.  Emotional Assessment Appearance:    Attitude/Demeanor/Rapport:    Affect (typically observed):    Orientation:    Alcohol / Substance use:    Psych involvement (Current and /or in the community):     Discharge Needs  Concerns to be addressed:    Readmission within the last 30 days:    Current discharge risk:    Barriers to Discharge:      Servando Snare, LCSW 07/26/2018, 10:53 AM

## 2018-07-26 NOTE — Progress Notes (Signed)
Attempt to call report x 2 to [facility] SNF with no success. Will try back in a few.

## 2018-07-27 DIAGNOSIS — K029 Dental caries, unspecified: Secondary | ICD-10-CM | POA: Diagnosis not present

## 2018-07-27 DIAGNOSIS — N179 Acute kidney failure, unspecified: Secondary | ICD-10-CM | POA: Diagnosis not present

## 2018-07-27 DIAGNOSIS — M6281 Muscle weakness (generalized): Secondary | ICD-10-CM | POA: Diagnosis not present

## 2018-07-27 DIAGNOSIS — I2510 Atherosclerotic heart disease of native coronary artery without angina pectoris: Secondary | ICD-10-CM | POA: Diagnosis not present

## 2018-07-27 DIAGNOSIS — N184 Chronic kidney disease, stage 4 (severe): Secondary | ICD-10-CM | POA: Diagnosis not present

## 2018-07-27 DIAGNOSIS — L299 Pruritus, unspecified: Secondary | ICD-10-CM | POA: Diagnosis not present

## 2018-07-27 DIAGNOSIS — D649 Anemia, unspecified: Secondary | ICD-10-CM | POA: Diagnosis not present

## 2018-07-27 DIAGNOSIS — I10 Essential (primary) hypertension: Secondary | ICD-10-CM | POA: Diagnosis not present

## 2018-07-27 DIAGNOSIS — I214 Non-ST elevation (NSTEMI) myocardial infarction: Secondary | ICD-10-CM | POA: Diagnosis not present

## 2018-07-27 DIAGNOSIS — K0889 Other specified disorders of teeth and supporting structures: Secondary | ICD-10-CM | POA: Diagnosis not present

## 2018-07-27 DIAGNOSIS — E785 Hyperlipidemia, unspecified: Secondary | ICD-10-CM | POA: Diagnosis not present

## 2018-07-27 DIAGNOSIS — E119 Type 2 diabetes mellitus without complications: Secondary | ICD-10-CM | POA: Diagnosis not present

## 2018-07-30 DIAGNOSIS — M6281 Muscle weakness (generalized): Secondary | ICD-10-CM | POA: Diagnosis not present

## 2018-07-30 DIAGNOSIS — K0889 Other specified disorders of teeth and supporting structures: Secondary | ICD-10-CM | POA: Diagnosis not present

## 2018-07-30 DIAGNOSIS — N179 Acute kidney failure, unspecified: Secondary | ICD-10-CM | POA: Diagnosis not present

## 2018-07-30 DIAGNOSIS — D649 Anemia, unspecified: Secondary | ICD-10-CM | POA: Diagnosis not present

## 2018-07-30 DIAGNOSIS — K029 Dental caries, unspecified: Secondary | ICD-10-CM | POA: Diagnosis not present

## 2018-07-30 DIAGNOSIS — N184 Chronic kidney disease, stage 4 (severe): Secondary | ICD-10-CM | POA: Diagnosis not present

## 2018-07-30 DIAGNOSIS — E119 Type 2 diabetes mellitus without complications: Secondary | ICD-10-CM | POA: Diagnosis not present

## 2018-07-30 DIAGNOSIS — E785 Hyperlipidemia, unspecified: Secondary | ICD-10-CM | POA: Diagnosis not present

## 2018-07-30 DIAGNOSIS — L299 Pruritus, unspecified: Secondary | ICD-10-CM | POA: Diagnosis not present

## 2018-07-30 DIAGNOSIS — I10 Essential (primary) hypertension: Secondary | ICD-10-CM | POA: Diagnosis not present

## 2018-07-30 DIAGNOSIS — I214 Non-ST elevation (NSTEMI) myocardial infarction: Secondary | ICD-10-CM | POA: Diagnosis not present

## 2018-07-30 DIAGNOSIS — I2510 Atherosclerotic heart disease of native coronary artery without angina pectoris: Secondary | ICD-10-CM | POA: Diagnosis not present

## 2018-08-01 DIAGNOSIS — D649 Anemia, unspecified: Secondary | ICD-10-CM | POA: Diagnosis not present

## 2018-08-01 DIAGNOSIS — M6281 Muscle weakness (generalized): Secondary | ICD-10-CM | POA: Diagnosis not present

## 2018-08-01 DIAGNOSIS — K029 Dental caries, unspecified: Secondary | ICD-10-CM | POA: Diagnosis not present

## 2018-08-01 DIAGNOSIS — L299 Pruritus, unspecified: Secondary | ICD-10-CM | POA: Diagnosis not present

## 2018-08-01 DIAGNOSIS — I10 Essential (primary) hypertension: Secondary | ICD-10-CM | POA: Diagnosis not present

## 2018-08-01 DIAGNOSIS — I214 Non-ST elevation (NSTEMI) myocardial infarction: Secondary | ICD-10-CM | POA: Diagnosis not present

## 2018-08-01 DIAGNOSIS — N184 Chronic kidney disease, stage 4 (severe): Secondary | ICD-10-CM | POA: Diagnosis not present

## 2018-08-01 DIAGNOSIS — E119 Type 2 diabetes mellitus without complications: Secondary | ICD-10-CM | POA: Diagnosis not present

## 2018-08-01 DIAGNOSIS — K0889 Other specified disorders of teeth and supporting structures: Secondary | ICD-10-CM | POA: Diagnosis not present

## 2018-08-01 DIAGNOSIS — I2510 Atherosclerotic heart disease of native coronary artery without angina pectoris: Secondary | ICD-10-CM | POA: Diagnosis not present

## 2018-08-01 DIAGNOSIS — R112 Nausea with vomiting, unspecified: Secondary | ICD-10-CM | POA: Diagnosis not present

## 2018-08-01 DIAGNOSIS — E785 Hyperlipidemia, unspecified: Secondary | ICD-10-CM | POA: Diagnosis not present

## 2018-08-03 DIAGNOSIS — R112 Nausea with vomiting, unspecified: Secondary | ICD-10-CM | POA: Diagnosis not present

## 2018-08-03 DIAGNOSIS — M6281 Muscle weakness (generalized): Secondary | ICD-10-CM | POA: Diagnosis not present

## 2018-08-03 DIAGNOSIS — N184 Chronic kidney disease, stage 4 (severe): Secondary | ICD-10-CM | POA: Diagnosis not present

## 2018-08-03 DIAGNOSIS — E785 Hyperlipidemia, unspecified: Secondary | ICD-10-CM | POA: Diagnosis not present

## 2018-08-03 DIAGNOSIS — K0889 Other specified disorders of teeth and supporting structures: Secondary | ICD-10-CM | POA: Diagnosis not present

## 2018-08-03 DIAGNOSIS — L299 Pruritus, unspecified: Secondary | ICD-10-CM | POA: Diagnosis not present

## 2018-08-03 DIAGNOSIS — E119 Type 2 diabetes mellitus without complications: Secondary | ICD-10-CM | POA: Diagnosis not present

## 2018-08-03 DIAGNOSIS — I10 Essential (primary) hypertension: Secondary | ICD-10-CM | POA: Diagnosis not present

## 2018-08-03 DIAGNOSIS — D649 Anemia, unspecified: Secondary | ICD-10-CM | POA: Diagnosis not present

## 2018-08-03 DIAGNOSIS — I214 Non-ST elevation (NSTEMI) myocardial infarction: Secondary | ICD-10-CM | POA: Diagnosis not present

## 2018-08-03 DIAGNOSIS — K029 Dental caries, unspecified: Secondary | ICD-10-CM | POA: Diagnosis not present

## 2018-08-03 DIAGNOSIS — I2510 Atherosclerotic heart disease of native coronary artery without angina pectoris: Secondary | ICD-10-CM | POA: Diagnosis not present

## 2018-08-10 ENCOUNTER — Ambulatory Visit (HOSPITAL_COMMUNITY): Payer: Self-pay | Admitting: Psychiatry

## 2018-08-10 DIAGNOSIS — L299 Pruritus, unspecified: Secondary | ICD-10-CM | POA: Diagnosis not present

## 2018-08-10 DIAGNOSIS — E785 Hyperlipidemia, unspecified: Secondary | ICD-10-CM | POA: Diagnosis not present

## 2018-08-10 DIAGNOSIS — I2510 Atherosclerotic heart disease of native coronary artery without angina pectoris: Secondary | ICD-10-CM | POA: Diagnosis not present

## 2018-08-10 DIAGNOSIS — E119 Type 2 diabetes mellitus without complications: Secondary | ICD-10-CM | POA: Diagnosis not present

## 2018-08-10 DIAGNOSIS — N184 Chronic kidney disease, stage 4 (severe): Secondary | ICD-10-CM | POA: Diagnosis not present

## 2018-08-10 DIAGNOSIS — K0889 Other specified disorders of teeth and supporting structures: Secondary | ICD-10-CM | POA: Diagnosis not present

## 2018-08-10 DIAGNOSIS — M6281 Muscle weakness (generalized): Secondary | ICD-10-CM | POA: Diagnosis not present

## 2018-08-10 DIAGNOSIS — I10 Essential (primary) hypertension: Secondary | ICD-10-CM | POA: Diagnosis not present

## 2018-08-10 DIAGNOSIS — K029 Dental caries, unspecified: Secondary | ICD-10-CM | POA: Diagnosis not present

## 2018-08-10 DIAGNOSIS — R112 Nausea with vomiting, unspecified: Secondary | ICD-10-CM | POA: Diagnosis not present

## 2018-08-10 DIAGNOSIS — I214 Non-ST elevation (NSTEMI) myocardial infarction: Secondary | ICD-10-CM | POA: Diagnosis not present

## 2018-08-10 DIAGNOSIS — D649 Anemia, unspecified: Secondary | ICD-10-CM | POA: Diagnosis not present

## 2018-08-17 ENCOUNTER — Other Ambulatory Visit: Payer: Self-pay | Admitting: Family Medicine

## 2018-08-20 DIAGNOSIS — I129 Hypertensive chronic kidney disease with stage 1 through stage 4 chronic kidney disease, or unspecified chronic kidney disease: Secondary | ICD-10-CM | POA: Diagnosis not present

## 2018-08-20 DIAGNOSIS — E1122 Type 2 diabetes mellitus with diabetic chronic kidney disease: Secondary | ICD-10-CM | POA: Diagnosis not present

## 2018-08-20 DIAGNOSIS — R262 Difficulty in walking, not elsewhere classified: Secondary | ICD-10-CM | POA: Diagnosis not present

## 2018-08-20 DIAGNOSIS — N184 Chronic kidney disease, stage 4 (severe): Secondary | ICD-10-CM | POA: Diagnosis not present

## 2018-08-20 DIAGNOSIS — M6281 Muscle weakness (generalized): Secondary | ICD-10-CM | POA: Diagnosis not present

## 2018-08-20 DIAGNOSIS — I252 Old myocardial infarction: Secondary | ICD-10-CM | POA: Diagnosis not present

## 2018-08-21 DIAGNOSIS — I129 Hypertensive chronic kidney disease with stage 1 through stage 4 chronic kidney disease, or unspecified chronic kidney disease: Secondary | ICD-10-CM | POA: Diagnosis not present

## 2018-08-21 DIAGNOSIS — M6281 Muscle weakness (generalized): Secondary | ICD-10-CM | POA: Diagnosis not present

## 2018-08-21 DIAGNOSIS — E1122 Type 2 diabetes mellitus with diabetic chronic kidney disease: Secondary | ICD-10-CM | POA: Diagnosis not present

## 2018-08-21 DIAGNOSIS — R262 Difficulty in walking, not elsewhere classified: Secondary | ICD-10-CM | POA: Diagnosis not present

## 2018-08-21 DIAGNOSIS — N184 Chronic kidney disease, stage 4 (severe): Secondary | ICD-10-CM | POA: Diagnosis not present

## 2018-08-21 DIAGNOSIS — I252 Old myocardial infarction: Secondary | ICD-10-CM | POA: Diagnosis not present

## 2018-08-24 DIAGNOSIS — R262 Difficulty in walking, not elsewhere classified: Secondary | ICD-10-CM | POA: Diagnosis not present

## 2018-08-24 DIAGNOSIS — E1122 Type 2 diabetes mellitus with diabetic chronic kidney disease: Secondary | ICD-10-CM | POA: Diagnosis not present

## 2018-08-24 DIAGNOSIS — I129 Hypertensive chronic kidney disease with stage 1 through stage 4 chronic kidney disease, or unspecified chronic kidney disease: Secondary | ICD-10-CM | POA: Diagnosis not present

## 2018-08-24 DIAGNOSIS — I252 Old myocardial infarction: Secondary | ICD-10-CM | POA: Diagnosis not present

## 2018-08-24 DIAGNOSIS — N184 Chronic kidney disease, stage 4 (severe): Secondary | ICD-10-CM | POA: Diagnosis not present

## 2018-08-24 DIAGNOSIS — M6281 Muscle weakness (generalized): Secondary | ICD-10-CM | POA: Diagnosis not present

## 2018-08-28 ENCOUNTER — Other Ambulatory Visit: Payer: Self-pay | Admitting: Family Medicine

## 2018-08-28 DIAGNOSIS — I10 Essential (primary) hypertension: Secondary | ICD-10-CM

## 2018-08-28 DIAGNOSIS — E1122 Type 2 diabetes mellitus with diabetic chronic kidney disease: Secondary | ICD-10-CM

## 2018-08-28 DIAGNOSIS — N184 Chronic kidney disease, stage 4 (severe): Principal | ICD-10-CM

## 2018-08-28 NOTE — Telephone Encounter (Signed)
Glipizide 5 mg refill Last Refill:06/21/18 # 15 tabs Last OV: 12/18/17 PCP: Jacklyn Shell Pharmacy:[facility] [phone]  Last HA1C 5.6 on 07/14/18  Lisinopril was discontinued on 07/19/18

## 2018-08-29 DIAGNOSIS — I129 Hypertensive chronic kidney disease with stage 1 through stage 4 chronic kidney disease, or unspecified chronic kidney disease: Secondary | ICD-10-CM | POA: Diagnosis not present

## 2018-08-29 DIAGNOSIS — I252 Old myocardial infarction: Secondary | ICD-10-CM | POA: Diagnosis not present

## 2018-08-29 DIAGNOSIS — N184 Chronic kidney disease, stage 4 (severe): Secondary | ICD-10-CM | POA: Diagnosis not present

## 2018-08-29 DIAGNOSIS — M6281 Muscle weakness (generalized): Secondary | ICD-10-CM | POA: Diagnosis not present

## 2018-08-29 DIAGNOSIS — R262 Difficulty in walking, not elsewhere classified: Secondary | ICD-10-CM | POA: Diagnosis not present

## 2018-08-29 DIAGNOSIS — E1122 Type 2 diabetes mellitus with diabetic chronic kidney disease: Secondary | ICD-10-CM | POA: Diagnosis not present

## 2018-08-30 ENCOUNTER — Other Ambulatory Visit: Payer: Self-pay | Admitting: Family Medicine

## 2018-08-30 DIAGNOSIS — E1122 Type 2 diabetes mellitus with diabetic chronic kidney disease: Secondary | ICD-10-CM

## 2018-08-30 DIAGNOSIS — N184 Chronic kidney disease, stage 4 (severe): Principal | ICD-10-CM

## 2018-08-30 DIAGNOSIS — I10 Essential (primary) hypertension: Secondary | ICD-10-CM

## 2018-08-30 NOTE — Telephone Encounter (Signed)
Glipizide  Refill [facility] 06/21/18 #15 refill 0 Lisinopril refill DC'd at discharge 07/11/18 Last OV:  With Dr Carlota Raspberry 11/2017; per chart pt has re established care with [facility] PCP: Per chart PCP [facility] [place]: [facility]  Refills refused for change in PCP

## 2018-08-31 DIAGNOSIS — N184 Chronic kidney disease, stage 4 (severe): Secondary | ICD-10-CM | POA: Diagnosis not present

## 2018-08-31 DIAGNOSIS — I129 Hypertensive chronic kidney disease with stage 1 through stage 4 chronic kidney disease, or unspecified chronic kidney disease: Secondary | ICD-10-CM | POA: Diagnosis not present

## 2018-08-31 DIAGNOSIS — R262 Difficulty in walking, not elsewhere classified: Secondary | ICD-10-CM | POA: Diagnosis not present

## 2018-08-31 DIAGNOSIS — E1122 Type 2 diabetes mellitus with diabetic chronic kidney disease: Secondary | ICD-10-CM | POA: Diagnosis not present

## 2018-08-31 DIAGNOSIS — M5417 Radiculopathy, lumbosacral region: Secondary | ICD-10-CM | POA: Diagnosis not present

## 2018-08-31 DIAGNOSIS — M6281 Muscle weakness (generalized): Secondary | ICD-10-CM | POA: Diagnosis not present

## 2018-08-31 DIAGNOSIS — I252 Old myocardial infarction: Secondary | ICD-10-CM | POA: Diagnosis not present

## 2018-09-03 DIAGNOSIS — F4325 Adjustment disorder with mixed disturbance of emotions and conduct: Secondary | ICD-10-CM | POA: Diagnosis not present

## 2018-09-03 DIAGNOSIS — I10 Essential (primary) hypertension: Secondary | ICD-10-CM | POA: Diagnosis not present

## 2018-09-03 DIAGNOSIS — E119 Type 2 diabetes mellitus without complications: Secondary | ICD-10-CM | POA: Diagnosis not present

## 2018-09-03 DIAGNOSIS — F09 Unspecified mental disorder due to known physiological condition: Secondary | ICD-10-CM | POA: Diagnosis not present

## 2018-09-03 DIAGNOSIS — Z23 Encounter for immunization: Secondary | ICD-10-CM | POA: Diagnosis not present

## 2018-09-05 DIAGNOSIS — R262 Difficulty in walking, not elsewhere classified: Secondary | ICD-10-CM | POA: Diagnosis not present

## 2018-09-05 DIAGNOSIS — E1122 Type 2 diabetes mellitus with diabetic chronic kidney disease: Secondary | ICD-10-CM | POA: Diagnosis not present

## 2018-09-05 DIAGNOSIS — M6281 Muscle weakness (generalized): Secondary | ICD-10-CM | POA: Diagnosis not present

## 2018-09-05 DIAGNOSIS — N184 Chronic kidney disease, stage 4 (severe): Secondary | ICD-10-CM | POA: Diagnosis not present

## 2018-09-05 DIAGNOSIS — I129 Hypertensive chronic kidney disease with stage 1 through stage 4 chronic kidney disease, or unspecified chronic kidney disease: Secondary | ICD-10-CM | POA: Diagnosis not present

## 2018-09-05 DIAGNOSIS — I252 Old myocardial infarction: Secondary | ICD-10-CM | POA: Diagnosis not present

## 2018-09-07 DIAGNOSIS — N184 Chronic kidney disease, stage 4 (severe): Secondary | ICD-10-CM | POA: Diagnosis not present

## 2018-09-07 DIAGNOSIS — I129 Hypertensive chronic kidney disease with stage 1 through stage 4 chronic kidney disease, or unspecified chronic kidney disease: Secondary | ICD-10-CM | POA: Diagnosis not present

## 2018-09-07 DIAGNOSIS — I252 Old myocardial infarction: Secondary | ICD-10-CM | POA: Diagnosis not present

## 2018-09-07 DIAGNOSIS — M6281 Muscle weakness (generalized): Secondary | ICD-10-CM | POA: Diagnosis not present

## 2018-09-07 DIAGNOSIS — E1122 Type 2 diabetes mellitus with diabetic chronic kidney disease: Secondary | ICD-10-CM | POA: Diagnosis not present

## 2018-09-07 DIAGNOSIS — R262 Difficulty in walking, not elsewhere classified: Secondary | ICD-10-CM | POA: Diagnosis not present

## 2018-09-10 DIAGNOSIS — M6281 Muscle weakness (generalized): Secondary | ICD-10-CM | POA: Diagnosis not present

## 2018-09-10 DIAGNOSIS — I252 Old myocardial infarction: Secondary | ICD-10-CM | POA: Diagnosis not present

## 2018-09-10 DIAGNOSIS — R262 Difficulty in walking, not elsewhere classified: Secondary | ICD-10-CM | POA: Diagnosis not present

## 2018-09-10 DIAGNOSIS — N184 Chronic kidney disease, stage 4 (severe): Secondary | ICD-10-CM | POA: Diagnosis not present

## 2018-09-10 DIAGNOSIS — E1122 Type 2 diabetes mellitus with diabetic chronic kidney disease: Secondary | ICD-10-CM | POA: Diagnosis not present

## 2018-09-10 DIAGNOSIS — I129 Hypertensive chronic kidney disease with stage 1 through stage 4 chronic kidney disease, or unspecified chronic kidney disease: Secondary | ICD-10-CM | POA: Diagnosis not present

## 2018-09-18 DIAGNOSIS — M5136 Other intervertebral disc degeneration, lumbar region: Secondary | ICD-10-CM | POA: Diagnosis not present

## 2018-10-24 ENCOUNTER — Encounter (HOSPITAL_COMMUNITY): Payer: Self-pay | Admitting: Psychiatry

## 2018-10-24 ENCOUNTER — Ambulatory Visit (INDEPENDENT_AMBULATORY_CARE_PROVIDER_SITE_OTHER): Payer: Medicare Other | Admitting: Psychiatry

## 2018-10-24 ENCOUNTER — Encounter

## 2018-10-24 VITALS — BP 154/65 | HR 88 | Ht 61.0 in

## 2018-10-24 DIAGNOSIS — F4323 Adjustment disorder with mixed anxiety and depressed mood: Secondary | ICD-10-CM

## 2018-10-24 DIAGNOSIS — I25119 Atherosclerotic heart disease of native coronary artery with unspecified angina pectoris: Secondary | ICD-10-CM

## 2018-10-24 MED ORDER — ZOLPIDEM TARTRATE ER 12.5 MG PO TBCR: 12.5000 mg | EXTENDED_RELEASE_TABLET | Freq: Every evening | ORAL | 3 refills | Status: DC | PRN

## 2018-10-24 NOTE — Progress Notes (Signed)
Psychiatric Initial Adult Assessment   Patient Identification: Gabrielle [name] MRN:  [id number] Date of Evaluation:  10/24/2018 Referral Source: Dr. [name] Chief Complaint:   Visit Diagnosis: Adjustment disorder with depression  At this time the patient is no different.  She is seen with her daughter [name].  Is been 1 year since her husband's died.  She still screams out for him at night.  She still asked for them all the time according to [facility].  Patient denies any significant distress.  There living circumstance is the biggest problem.  Patient needs a therapist.  Apparently in the community there is a geriatric clinic and her primary care doctor has referred them to that clinic.  I think this is a great idea.  She needs a therapist.  I do not think this patient has a clinical depression that an antidepressant would help with.  My contribution is to make sure she sleeps at night with Ambien.  She is eating fairly well.  She denies being suicidal.  She is not psychotic.  Interestingly since I have seen her she is actually had some heart issues.  She has had a stent placed and was told that she had a heart attack months ago.  Today she did completely denies chest pain or shortness of breath.  She denies any physical complaints at all.  The patient seemed flat.  She seems disinterested in things.  She clearly in conflict with [name] who she lives with her daughter.  The patient seems to be able to think and concentrate fairly well.  She demonstrates no evidence of any neurological symptoms.  (Hypo) Manic Symptoms:   Anxiety Symptoms:   Psychotic Symptoms:   PTSD Symptoms:   Past Psychiatric History: Negative  Previous Psychotropic Medications: Yes   Substance Abuse History in the last 12 months:  No.  Consequences of Substance Abuse: NA  Past Medical History:  Past Medical History:  Diagnosis Date  . Anemia    hx  . Anxiety   . Arthritis   . Azotemia   . Carotid artery disease ([place])   .  Claudication ([place])   . Coronary artery disease   . CVA (cerebral vascular accident) ([place]) 2008   LOC DIZZINESS ; "mini stroke" (07/12/2018)  . Depression   . Essential hypertension, benign   . History of gout   . History of kidney stones   . Hyperlipidemia   . Migraine    "years ago; due to vision issues; got glasses & they went away" (07/12/2018)  . Neck pain   . NSTEMI (non-ST elevated myocardial infarction) ([place]) 07/11/2018  . PAD (peripheral artery disease) ([facility])    hx. LCEA, hx Lt renal art. stenting, occl rt renal artery, occluded bil SFAs, moderatie iliac disease,    . Renal cell cancer ([place]) 2001   "left"  . Seizure ([place])    pt does not recall this hx on 07/12/2018  . Type II diabetes mellitus ([place]) 1990    Past Surgical History:  Procedure Laterality Date  . ABDOMINAL HYSTERECTOMY    . BACK SURGERY    . CAROTID ENDARTERECTOMY Left   . CATARACT EXTRACTION W/ INTRAOCULAR LENS  IMPLANT, BILATERAL Bilateral   . CORONARY ATHERECTOMY N/A 07/16/2018   Procedure: CORONARY ATHERECTOMY;  Surgeon: Jettie Booze, MD;  Location: [place] [facility];  Service: Cardiovascular;  Laterality: N/A;  . CORONARY STENT INTERVENTION  07/16/2018  . CORONARY STENT INTERVENTION N/A 07/16/2018   Procedure: CORONARY STENT INTERVENTION;  Surgeon:  Jettie Booze, MD;  Location: [place] [facility];  Service: Cardiovascular;  Laterality: N/A;  . INCISION AND DRAINAGE ABSCESS Right 10/26/2017   Procedure: INCISION AND DRAINAGE HEMATOMA RIGHT SHIN;  Surgeon: Georganna Skeans, MD;  Location: [place];  Service: General;  Laterality: Right;  . LEFT HEART CATH AND CORONARY ANGIOGRAPHY N/A 07/13/2018   Procedure: LEFT HEART CATH AND CORONARY ANGIOGRAPHY;  Surgeon: Jettie Booze, MD;  Location: [place] CV LAB;  Service: Cardiovascular;  Laterality: N/A;  . LEFT HEART CATH AND CORONARY ANGIOGRAPHY N/A 07/16/2018   Procedure: LEFT HEART CATH AND CORONARY ANGIOGRAPHY;  Surgeon: Jettie Booze,  MD;  Location: [place] [facility];  Service: Cardiovascular;  Laterality: N/A;  . LUMBAR [place]    . NEPHRECTOMY Right   . NM MYOCAR PERF WALL MOTION  07/12/2011   normal  . PV angiogram  2004   Lt renal artery stent  . TEMPORARY PACEMAKER N/A 07/16/2018   Procedure: TEMPORARY PACEMAKER;  Surgeon: Jettie Booze, MD;  Location: [place] [facility];  Service: Cardiovascular;  Laterality: N/A;  . TONSILLECTOMY    . URETERAL STENT PLACEMENT Left     Family Psychiatric History:   Family History:  Family History  Problem Relation Age of Onset  . Cancer Mother   . Heart disease Father     Social History:   Social History   Socioeconomic History  . Marital status: Widowed    Spouse name: Not on file  . Number of children: 2  . Years of education: Not on file  . Highest education level: Not on file  Occupational History  . Occupation: Retired    Fish farm manager: RETIRED  Social Needs  . Financial resource strain: Not on file  . Food insecurity:    Worry: Not on file    Inability: Not on file  . Transportation needs:    Medical: Not on file    Non-medical: Not on file  Tobacco Use  . Smoking status: Former Smoker    Packs/day: 1.00    Years: 50.00    Pack years: 50.00    Types: Cigarettes    Last attempt to quit: 12/26/2005    Years since quitting: 12.8  . Smokeless tobacco: Never Used  Substance and Sexual Activity  . Alcohol use: Never    Frequency: Never  . Drug use: Never  . Sexual activity: Not Currently  Lifestyle  . Physical activity:    Days per week: Not on file    Minutes per session: Not on file  . Stress: Not on file  Relationships  . Social connections:    Talks on phone: Not on file    Gets together: Not on file    Attends religious service: Not on file    Active member of club or organization: Not on file    Attends meetings of clubs or organizations: Not on file    Relationship status: Not on file  Other Topics Concern  . Not on file   Social History Narrative  . Not on file    Additional Social History:   Allergies:   Allergies  Allergen Reactions  . Codeine Nausea And Vomiting  . Fentanyl Other (See Comments)    unknown  . Lacosamide Other (See Comments)     Other name is, VIMPAT unknown  . Levetiracetam Other (See Comments)    Strange feelings in head  . Metformin And Related Other (See Comments)    unknown  .  Morphine And Related Itching  . Oxybutynin Swelling    mouth  . Sertraline Nausea Only and Other (See Comments)    Swelling in mouth  . Tessalon [Benzonatate] Other (See Comments)    unknown  . Other Rash    BETA BLOCKER    Metabolic Disorder Labs: Lab Results  Component Value Date   HGBA1C 5.6 07/14/2018   MPG 114.02 07/14/2018   MPG 128 (H) 07/30/2010   No results found for: PROLACTIN Lab Results  Component Value Date   CHOL 186 09/25/2017   TRIG 124 09/25/2017   HDL 43 09/25/2017   CHOLHDL 4.3 09/25/2017   VLDL 25 05/03/2016   LDLCALC 118 (H) 09/25/2017   LDLCALC 59 05/03/2016     Current Medications: Current Outpatient Medications  Medication Sig Dispense Refill  . acetaminophen (TYLENOL) 325 MG tablet Take 2 tablets (650 mg total) by mouth every 6 (six) hours as needed for mild pain (or Fever >/= 101). 30 tablet 0  . acetaminophen (TYLENOL) 500 MG tablet Take 1,000 mg by mouth every 6 (six) hours as needed for mild pain or fever.     Marland Kitchen amLODipine (NORVASC) 5 MG tablet Take 5 mg by mouth daily.  3  . amoxicillin-clavulanate (AUGMENTIN) 500-125 MG tablet Take 1 tablet (500 mg total) by mouth 2 (two) times daily. 14 tablet 0  . aspirin 81 MG tablet Take 81 mg by mouth daily.    Marland Kitchen atorvastatin (LIPITOR) 40 MG tablet Take 1 tablet (40 mg total) by mouth daily at 6 PM.    . carvedilol (COREG) 3.125 MG tablet Take 1 tablet (3.125 mg total) by mouth 2 (two) times daily with a meal.    . clopidogrel (PLAVIX) 75 MG tablet Take 1 tablet (75 mg total) by mouth daily.    .  diphenhydrAMINE (BENADRYL) 25 mg capsule Take 1 capsule (25 mg total) by mouth every 6 (six) hours as needed for itching, allergies or sleep (prn sleep if Ambien ineffective). 30 capsule 0  . ferrous gluconate (FERGON) 324 MG tablet Take 1 tablet (324 mg total) by mouth daily with breakfast.  3  . ferrous sulfate (SLOW FE) 160 (50 Fe) MG TBCR SR tablet Take 1 tablet (160 mg total) by mouth daily. 30 each 3  . glipiZIDE (GLUCOTROL) 5 MG tablet TAKE 1/2 TABLET BY MOUTH DAILY BEFORE BREAKFAST 15 tablet 0  . insulin aspart (NOVOLOG) 100 UNIT/ML injection Inject 0-9 Units into the skin 3 (three) times daily with meals. 10 mL 11  . lactobacillus acidophilus & bulgar (LACTINEX) chewable tablet Chew 1 tablet by mouth 3 (three) times daily with meals. 30 tablet 0  . nitroGLYCERIN (NITROSTAT) 0.4 MG SL tablet Place 1 tablet (0.4 mg total) under the tongue every 5 (five) minutes as needed for chest pain.  12  . ondansetron (ZOFRAN ODT) 4 MG disintegrating tablet Take 1 tablet (4 mg total) by mouth every 8 (eight) hours as needed for nausea or vomiting. 20 tablet 0  . traMADol (ULTRAM) 50 MG tablet Take 1 tablet (50 mg total) by mouth every 6 (six) hours as needed for moderate pain or severe pain. 15 tablet 0  . zolpidem (AMBIEN CR) 12.5 MG CR tablet Take 1 tablet (12.5 mg total) by mouth at bedtime as needed for sleep. 30 tablet 3   No current facility-administered medications for this visit.     Neurologic: Headache: No Seizure: No Paresthesias:NA  Musculoskeletal: Strength & Muscle Tone: Normal Gait & Station: Normal Patient leans:  N/A  Psychiatric Specialty Exam: ROS  Blood pressure (!) 154/65, pulse 88, height 5\' 1"  (1.549 m), SpO2 100 %.Body mass index is 23.43 kg/m.  General Appearance: Well Groomed  Eye Contact:  Good  Speech:  Clear and Coherent  Volume:  Normal  Mood:  Euthymic  Affect:  Appropriate  Thought Process:  Goal Directed  Orientation:  NA  Thought Content:  Logical   Suicidal Thoughts:  No  Homicidal Thoughts:  No  Memory:  NA  Judgement:  Good  Insight:  Good  Psychomotor Activity:  Normal  Concentration:  Concentration: Good  Recall:  Good  Fund of Knowledge:Good  Language: Negative  Akathisia:  No  Handed:  Right  AIMS (if indicated):    Assets:  Desire for Improvement  ADL's:  Intact  Cognition: WNL  Sleep:  Abnormal , problems maintaining     Treatment Plan Summary: Medication management At this time things do seem to be in a standstill.  On the other and it is true that the patient is finally sold her home.  This happened about a month ago.  Now the son and the patient making efforts to find a place for her to live which I think would be ideal.  Take with funds they have hopefully went to space.  But more has to be done other than giving her a place to live.  I think the idea of going to their local clinic or geriatrics where they can enter into therapy is the best thing.  I strongly encouraged him to make contact with this clinic.  The patient is not suicidal.  Her first problem is complicated bereavement from the death of her husband [name].  It is difficult to determine if she is making any progress.  She lives a very isolated lifestyle at this time.  She is not suicidal.

## 2018-11-02 DIAGNOSIS — I10 Essential (primary) hypertension: Secondary | ICD-10-CM | POA: Diagnosis not present

## 2018-11-02 DIAGNOSIS — R350 Frequency of micturition: Secondary | ICD-10-CM | POA: Diagnosis not present

## 2018-11-02 DIAGNOSIS — N189 Chronic kidney disease, unspecified: Secondary | ICD-10-CM | POA: Diagnosis not present

## 2018-11-02 DIAGNOSIS — M48062 Spinal stenosis, lumbar region with neurogenic claudication: Secondary | ICD-10-CM | POA: Diagnosis not present

## 2018-11-02 DIAGNOSIS — M5431 Sciatica, right side: Secondary | ICD-10-CM | POA: Diagnosis not present

## 2018-11-02 DIAGNOSIS — M4316 Spondylolisthesis, lumbar region: Secondary | ICD-10-CM | POA: Diagnosis not present

## 2018-11-02 DIAGNOSIS — G8929 Other chronic pain: Secondary | ICD-10-CM | POA: Diagnosis not present

## 2018-11-02 DIAGNOSIS — I639 Cerebral infarction, unspecified: Secondary | ICD-10-CM | POA: Diagnosis not present

## 2018-11-02 DIAGNOSIS — F0150 Vascular dementia without behavioral disturbance: Secondary | ICD-10-CM | POA: Diagnosis not present

## 2018-11-02 DIAGNOSIS — I509 Heart failure, unspecified: Secondary | ICD-10-CM | POA: Diagnosis not present

## 2018-11-02 DIAGNOSIS — R000 Tachycardia, unspecified: Secondary | ICD-10-CM | POA: Diagnosis not present

## 2018-11-02 DIAGNOSIS — I119 Hypertensive heart disease without heart failure: Secondary | ICD-10-CM | POA: Diagnosis not present

## 2018-11-02 DIAGNOSIS — I130 Hypertensive heart and chronic kidney disease with heart failure and stage 1 through stage 4 chronic kidney disease, or unspecified chronic kidney disease: Secondary | ICD-10-CM | POA: Diagnosis not present

## 2018-11-02 DIAGNOSIS — I2510 Atherosclerotic heart disease of native coronary artery without angina pectoris: Secondary | ICD-10-CM | POA: Diagnosis not present

## 2018-11-02 DIAGNOSIS — N184 Chronic kidney disease, stage 4 (severe): Secondary | ICD-10-CM | POA: Diagnosis not present

## 2018-11-02 DIAGNOSIS — E611 Iron deficiency: Secondary | ICD-10-CM | POA: Diagnosis not present

## 2018-11-02 DIAGNOSIS — R809 Proteinuria, unspecified: Secondary | ICD-10-CM | POA: Diagnosis not present

## 2018-11-02 DIAGNOSIS — E1142 Type 2 diabetes mellitus with diabetic polyneuropathy: Secondary | ICD-10-CM | POA: Diagnosis not present

## 2018-11-02 DIAGNOSIS — E119 Type 2 diabetes mellitus without complications: Secondary | ICD-10-CM | POA: Diagnosis not present

## 2018-11-02 DIAGNOSIS — Z905 Acquired absence of kidney: Secondary | ICD-10-CM | POA: Diagnosis not present

## 2018-11-02 DIAGNOSIS — E1122 Type 2 diabetes mellitus with diabetic chronic kidney disease: Secondary | ICD-10-CM | POA: Diagnosis not present

## 2018-11-02 DIAGNOSIS — F0151 Vascular dementia with behavioral disturbance: Secondary | ICD-10-CM | POA: Diagnosis not present

## 2018-11-02 DIAGNOSIS — D649 Anemia, unspecified: Secondary | ICD-10-CM | POA: Diagnosis not present

## 2018-11-02 DIAGNOSIS — Z79899 Other long term (current) drug therapy: Secondary | ICD-10-CM | POA: Diagnosis not present

## 2018-11-02 DIAGNOSIS — F17210 Nicotine dependence, cigarettes, uncomplicated: Secondary | ICD-10-CM | POA: Diagnosis not present

## 2018-11-02 DIAGNOSIS — D509 Iron deficiency anemia, unspecified: Secondary | ICD-10-CM | POA: Diagnosis not present

## 2018-11-26 DIAGNOSIS — F0151 Vascular dementia with behavioral disturbance: Secondary | ICD-10-CM | POA: Diagnosis not present

## 2018-11-26 DIAGNOSIS — I119 Hypertensive heart disease without heart failure: Secondary | ICD-10-CM | POA: Diagnosis not present

## 2018-11-26 DIAGNOSIS — D500 Iron deficiency anemia secondary to blood loss (chronic): Secondary | ICD-10-CM | POA: Diagnosis not present

## 2018-11-26 DIAGNOSIS — I10 Essential (primary) hypertension: Secondary | ICD-10-CM | POA: Diagnosis not present

## 2018-11-26 DIAGNOSIS — E538 Deficiency of other specified B group vitamins: Secondary | ICD-10-CM | POA: Diagnosis not present

## 2018-11-26 DIAGNOSIS — D649 Anemia, unspecified: Secondary | ICD-10-CM | POA: Diagnosis not present

## 2018-11-26 DIAGNOSIS — I214 Non-ST elevation (NSTEMI) myocardial infarction: Secondary | ICD-10-CM | POA: Diagnosis not present

## 2018-11-26 DIAGNOSIS — I2510 Atherosclerotic heart disease of native coronary artery without angina pectoris: Secondary | ICD-10-CM | POA: Diagnosis not present

## 2018-11-26 DIAGNOSIS — E119 Type 2 diabetes mellitus without complications: Secondary | ICD-10-CM | POA: Diagnosis not present

## 2018-12-07 ENCOUNTER — Ambulatory Visit: Payer: Self-pay | Admitting: *Deleted

## 2018-12-07 NOTE — Telephone Encounter (Addendum)
Called pt, left message to call back regarding her list of allergies in her chart. Called another # and spoke with the patient.  She only wanted to know if she had a statin listed on her list of allergy medications.  Went over the list with her and no statin listed on the allergy list. But confirmed with her she is taking atorvastatin.  Pt voiced understanding.

## 2018-12-07 NOTE — Telephone Encounter (Signed)
Pt called back and asked again if was allergic to statin drugs. Per chart review, no allergies noted.

## 2019-01-02 DIAGNOSIS — Q100 Congenital ptosis: Secondary | ICD-10-CM | POA: Diagnosis not present

## 2019-01-02 DIAGNOSIS — E119 Type 2 diabetes mellitus without complications: Secondary | ICD-10-CM | POA: Diagnosis not present

## 2019-01-02 DIAGNOSIS — H26493 Other secondary cataract, bilateral: Secondary | ICD-10-CM | POA: Diagnosis not present

## 2019-01-02 DIAGNOSIS — Z961 Presence of intraocular lens: Secondary | ICD-10-CM | POA: Diagnosis not present

## 2019-01-17 DIAGNOSIS — D131 Benign neoplasm of stomach: Secondary | ICD-10-CM | POA: Diagnosis not present

## 2019-01-17 DIAGNOSIS — K208 Other esophagitis: Secondary | ICD-10-CM | POA: Diagnosis not present

## 2019-01-17 DIAGNOSIS — D509 Iron deficiency anemia, unspecified: Secondary | ICD-10-CM | POA: Diagnosis not present

## 2019-01-17 DIAGNOSIS — K449 Diaphragmatic hernia without obstruction or gangrene: Secondary | ICD-10-CM | POA: Diagnosis not present

## 2019-01-18 DIAGNOSIS — K317 Polyp of stomach and duodenum: Secondary | ICD-10-CM | POA: Diagnosis not present

## 2019-01-18 DIAGNOSIS — K5730 Diverticulosis of large intestine without perforation or abscess without bleeding: Secondary | ICD-10-CM | POA: Diagnosis not present

## 2019-01-18 DIAGNOSIS — K449 Diaphragmatic hernia without obstruction or gangrene: Secondary | ICD-10-CM | POA: Diagnosis not present

## 2019-01-18 DIAGNOSIS — N189 Chronic kidney disease, unspecified: Secondary | ICD-10-CM | POA: Diagnosis not present

## 2019-01-18 DIAGNOSIS — E119 Type 2 diabetes mellitus without complications: Secondary | ICD-10-CM | POA: Diagnosis not present

## 2019-01-18 DIAGNOSIS — K6389 Other specified diseases of intestine: Secondary | ICD-10-CM | POA: Diagnosis not present

## 2019-01-18 DIAGNOSIS — K640 First degree hemorrhoids: Secondary | ICD-10-CM | POA: Diagnosis not present

## 2019-01-18 DIAGNOSIS — I252 Old myocardial infarction: Secondary | ICD-10-CM | POA: Diagnosis not present

## 2019-01-18 DIAGNOSIS — D509 Iron deficiency anemia, unspecified: Secondary | ICD-10-CM | POA: Diagnosis not present

## 2019-01-18 DIAGNOSIS — K209 Esophagitis, unspecified: Secondary | ICD-10-CM | POA: Diagnosis not present

## 2019-01-18 DIAGNOSIS — I129 Hypertensive chronic kidney disease with stage 1 through stage 4 chronic kidney disease, or unspecified chronic kidney disease: Secondary | ICD-10-CM | POA: Diagnosis not present

## 2019-01-28 DIAGNOSIS — F09 Unspecified mental disorder due to known physiological condition: Secondary | ICD-10-CM | POA: Diagnosis not present

## 2019-01-28 DIAGNOSIS — Z85528 Personal history of other malignant neoplasm of kidney: Secondary | ICD-10-CM | POA: Diagnosis not present

## 2019-01-28 DIAGNOSIS — M48062 Spinal stenosis, lumbar region with neurogenic claudication: Secondary | ICD-10-CM | POA: Diagnosis not present

## 2019-01-28 DIAGNOSIS — B372 Candidiasis of skin and nail: Secondary | ICD-10-CM | POA: Diagnosis not present

## 2019-01-28 DIAGNOSIS — D649 Anemia, unspecified: Secondary | ICD-10-CM | POA: Diagnosis not present

## 2019-01-28 DIAGNOSIS — F0151 Vascular dementia with behavioral disturbance: Secondary | ICD-10-CM | POA: Diagnosis not present

## 2019-01-28 DIAGNOSIS — N184 Chronic kidney disease, stage 4 (severe): Secondary | ICD-10-CM | POA: Diagnosis not present

## 2019-01-28 DIAGNOSIS — I10 Essential (primary) hypertension: Secondary | ICD-10-CM | POA: Diagnosis not present

## 2019-01-28 DIAGNOSIS — E119 Type 2 diabetes mellitus without complications: Secondary | ICD-10-CM | POA: Diagnosis not present

## 2019-01-28 DIAGNOSIS — R809 Proteinuria, unspecified: Secondary | ICD-10-CM | POA: Diagnosis not present

## 2019-01-28 DIAGNOSIS — F4325 Adjustment disorder with mixed disturbance of emotions and conduct: Secondary | ICD-10-CM | POA: Diagnosis not present

## 2019-01-28 DIAGNOSIS — I998 Other disorder of circulatory system: Secondary | ICD-10-CM | POA: Diagnosis not present

## 2019-01-28 DIAGNOSIS — F5101 Primary insomnia: Secondary | ICD-10-CM | POA: Diagnosis not present

## 2019-01-28 DIAGNOSIS — C649 Malignant neoplasm of unspecified kidney, except renal pelvis: Secondary | ICD-10-CM | POA: Diagnosis not present

## 2019-01-28 DIAGNOSIS — N183 Chronic kidney disease, stage 3 (moderate): Secondary | ICD-10-CM | POA: Diagnosis not present

## 2019-01-28 DIAGNOSIS — I2510 Atherosclerotic heart disease of native coronary artery without angina pectoris: Secondary | ICD-10-CM | POA: Diagnosis not present

## 2019-01-28 DIAGNOSIS — D500 Iron deficiency anemia secondary to blood loss (chronic): Secondary | ICD-10-CM | POA: Diagnosis not present

## 2019-01-28 DIAGNOSIS — I214 Non-ST elevation (NSTEMI) myocardial infarction: Secondary | ICD-10-CM | POA: Diagnosis not present

## 2019-01-28 DIAGNOSIS — Z862 Personal history of diseases of the blood and blood-forming organs and certain disorders involving the immune mechanism: Secondary | ICD-10-CM | POA: Diagnosis not present

## 2019-01-28 DIAGNOSIS — E538 Deficiency of other specified B group vitamins: Secondary | ICD-10-CM | POA: Diagnosis not present

## 2019-01-28 DIAGNOSIS — E7800 Pure hypercholesterolemia, unspecified: Secondary | ICD-10-CM | POA: Diagnosis not present

## 2019-01-28 DIAGNOSIS — I639 Cerebral infarction, unspecified: Secondary | ICD-10-CM | POA: Diagnosis not present

## 2019-01-28 DIAGNOSIS — I119 Hypertensive heart disease without heart failure: Secondary | ICD-10-CM | POA: Diagnosis not present

## 2019-01-28 DIAGNOSIS — Z955 Presence of coronary angioplasty implant and graft: Secondary | ICD-10-CM | POA: Diagnosis not present

## 2019-01-30 DIAGNOSIS — R809 Proteinuria, unspecified: Secondary | ICD-10-CM | POA: Diagnosis not present

## 2019-01-30 DIAGNOSIS — H02834 Dermatochalasis of left upper eyelid: Secondary | ICD-10-CM | POA: Diagnosis not present

## 2019-01-30 DIAGNOSIS — N184 Chronic kidney disease, stage 4 (severe): Secondary | ICD-10-CM | POA: Diagnosis not present

## 2019-01-30 DIAGNOSIS — H02831 Dermatochalasis of right upper eyelid: Secondary | ICD-10-CM | POA: Diagnosis not present

## 2019-01-30 DIAGNOSIS — N281 Cyst of kidney, acquired: Secondary | ICD-10-CM | POA: Diagnosis not present

## 2019-01-30 DIAGNOSIS — H02413 Mechanical ptosis of bilateral eyelids: Secondary | ICD-10-CM | POA: Diagnosis not present

## 2019-01-30 DIAGNOSIS — H02423 Myogenic ptosis of bilateral eyelids: Secondary | ICD-10-CM | POA: Diagnosis not present

## 2019-01-30 DIAGNOSIS — Z905 Acquired absence of kidney: Secondary | ICD-10-CM | POA: Diagnosis not present

## 2019-01-30 DIAGNOSIS — H0279 Other degenerative disorders of eyelid and periocular area: Secondary | ICD-10-CM | POA: Diagnosis not present

## 2019-01-30 DIAGNOSIS — H53483 Generalized contraction of visual field, bilateral: Secondary | ICD-10-CM | POA: Diagnosis not present

## 2019-02-13 ENCOUNTER — Ambulatory Visit (INDEPENDENT_AMBULATORY_CARE_PROVIDER_SITE_OTHER): Payer: Medicare Other | Admitting: Psychiatry

## 2019-02-13 ENCOUNTER — Encounter (HOSPITAL_COMMUNITY): Payer: Self-pay | Admitting: Psychiatry

## 2019-02-13 VITALS — BP 159/65 | HR 63 | Ht 61.0 in | Wt 128.0 lb

## 2019-02-13 DIAGNOSIS — F329 Major depressive disorder, single episode, unspecified: Secondary | ICD-10-CM

## 2019-02-13 MED ORDER — ZOLPIDEM TARTRATE ER 12.5 MG PO TBCR: 12.5000 mg | EXTENDED_RELEASE_TABLET | Freq: Every evening | ORAL | 4 refills | Status: DC | PRN

## 2019-02-13 NOTE — Progress Notes (Signed)
Psychiatric Initial Adult Assessment   Patient Identification: Gabrielle [name] MRN:  [id number] Date of Evaluation:  02/13/2019 Referral Source: Dr. [name] Chief Complaint:   Visit Diagnosis: Adjustment disorder with depression Today the patient is seen with [name] her daughter.  The patient is at her baseline.  She denies persistent depression.  She has no evidence of psychosis.  She is sleeping fairly well taking Ambien on a regular basis her appetite is good.  She still enjoys watching TV and doing puzzles.  She denies being suicidal.  She denies the use of alcohol or drugs.  She still trying to get into some type of treatment program intercommunity.  The [place] and is very [place] contacts.  Generally patient is stable.  Her daughter says that he is to is doing fairly well.  My contribution is to add a small amount of Ambien.  My recommendations are for them to find a senior center or possibly look into changing their living environment.   (Hypo) Manic Symptoms:   Anxiety Symptoms:   Psychotic Symptoms:   PTSD Symptoms:   Past Psychiatric History: Negative  Previous Psychotropic Medications: Yes   Substance Abuse History in the last 12 months:  No.  Consequences of Substance Abuse: NA  Past Medical History:  Past Medical History:  Diagnosis Date  . Anemia    hx  . Anxiety   . Arthritis   . Azotemia   . Carotid artery disease ([place])   . Claudication ([place])   . Coronary artery disease   . CVA (cerebral vascular accident) ([place]) 2008   LOC DIZZINESS ; "mini stroke" (07/12/2018)  . Depression   . Essential hypertension, benign   . History of gout   . History of kidney stones   . Hyperlipidemia   . Migraine    "years ago; due to vision issues; got glasses & they went away" (07/12/2018)  . Neck pain   . NSTEMI (non-ST elevated myocardial infarction) ([place]) 07/11/2018  . PAD (peripheral artery disease) ([facility])    hx. LCEA, hx Lt renal art.  stenting, occl rt renal artery, occluded bil SFAs, moderatie iliac disease,    . Renal cell cancer ([place]) 2001   "left"  . Seizure ([place])    pt does not recall this hx on 07/12/2018  . Type II diabetes mellitus ([place]) 1990    Past Surgical History:  Procedure Laterality Date  . ABDOMINAL HYSTERECTOMY    . BACK SURGERY    . CAROTID ENDARTERECTOMY Left   . CATARACT EXTRACTION W/ INTRAOCULAR LENS  IMPLANT, BILATERAL Bilateral   . CORONARY ATHERECTOMY N/A 07/16/2018   Procedure: CORONARY ATHERECTOMY;  Surgeon: Jettie Booze, MD;  Location: [facility];  Service: Cardiovascular;  Laterality: N/A;  . CORONARY STENT INTERVENTION  07/16/2018  . CORONARY STENT INTERVENTION N/A 07/16/2018   Procedure: CORONARY STENT INTERVENTION;  Surgeon: Jettie Booze, MD;  Location: [facility];  Service: Cardiovascular;  Laterality: N/A;  . INCISION AND DRAINAGE ABSCESS Right 10/26/2017   Procedure: INCISION AND DRAINAGE HEMATOMA RIGHT SHIN;  Surgeon: Georganna Skeans, MD;  Location: [facility];  Service: General;  Laterality: Right;  . LEFT HEART CATH AND CORONARY ANGIOGRAPHY N/A 07/13/2018   Procedure: LEFT HEART CATH AND CORONARY ANGIOGRAPHY;  Surgeon: Jettie Booze, MD;  Location: [facility];  Service: Cardiovascular;  Laterality: N/A;  . LEFT HEART CATH AND CORONARY ANGIOGRAPHY N/A 07/16/2018   Procedure: LEFT HEART CATH  AND CORONARY ANGIOGRAPHY;  Surgeon: Jettie Booze, MD;  Location: [facility];  Service: Cardiovascular;  Laterality: N/A;  . LUMBAR [place]    . NEPHRECTOMY Right   . NM MYOCAR PERF WALL MOTION  07/12/2011   normal  . PV angiogram  2004   Lt renal artery stent  . TEMPORARY PACEMAKER N/A 07/16/2018   Procedure: TEMPORARY PACEMAKER;  Surgeon: Jettie Booze, MD;  Location: [facility];  Service: Cardiovascular;  Laterality: N/A;  . TONSILLECTOMY    . URETERAL STENT PLACEMENT Left     Family Psychiatric History:   Family  History:  Family History  Problem Relation Age of Onset  . Cancer Mother   . Heart disease Father     Social History:   Social History   Socioeconomic History  . Marital status: Widowed    Spouse name: Not on file  . Number of children: 2  . Years of education: Not on file  . Highest education level: Not on file  Occupational History  . Occupation: Retired    Fish farm manager: RETIRED  Social Needs  . Financial resource strain: Not on file  . Food insecurity:    Worry: Not on file    Inability: Not on file  . Transportation needs:    Medical: Not on file    Non-medical: Not on file  Tobacco Use  . Smoking status: Former Smoker    Packs/day: 1.00    Years: 50.00    Pack years: 50.00    Types: Cigarettes    Last attempt to quit: 12/26/2005    Years since quitting: 13.1  . Smokeless tobacco: Never Used  Substance and Sexual Activity  . Alcohol use: Never    Frequency: Never  . Drug use: Never  . Sexual activity: Not Currently  Lifestyle  . Physical activity:    Days per week: Not on file    Minutes per session: Not on file  . Stress: Not on file  Relationships  . Social connections:    Talks on phone: Not on file    Gets together: Not on file    Attends religious service: Not on file    Active member of club or organization: Not on file    Attends meetings of clubs or organizations: Not on file    Relationship status: Not on file  Other Topics Concern  . Not on file  Social History Narrative  . Not on file    Additional Social History:   Allergies:   Allergies  Allergen Reactions  . Codeine Nausea And Vomiting  . Fentanyl Other (See Comments)    unknown  . Lacosamide Other (See Comments)     Other name is, VIMPAT unknown  . Levetiracetam Other (See Comments)    Strange feelings in head  . Metformin And Related Other (See Comments)    unknown  . Morphine And Related Itching  . Oxybutynin Swelling    mouth  . Sertraline Nausea Only and Other (See  Comments)    Swelling in mouth  . Tessalon [Benzonatate] Other (See Comments)    unknown  . Other Rash    BETA BLOCKER    Metabolic Disorder Labs: Lab Results  Component Value Date   HGBA1C 5.6 07/14/2018   MPG 114.02 07/14/2018   MPG 128 (H) 07/30/2010   No results found for: PROLACTIN Lab Results  Component Value Date   CHOL 186 09/25/2017   TRIG 124 09/25/2017   HDL  43 09/25/2017   CHOLHDL 4.3 09/25/2017   VLDL 25 05/03/2016   LDLCALC 118 (H) 09/25/2017   LDLCALC 59 05/03/2016     Current Medications: Current Outpatient Medications  Medication Sig Dispense Refill  . acetaminophen (TYLENOL) 325 MG tablet Take 2 tablets (650 mg total) by mouth every 6 (six) hours as needed for mild pain (or Fever >/= 101). 30 tablet 0  . acetaminophen (TYLENOL) 500 MG tablet Take 1,000 mg by mouth every 6 (six) hours as needed for mild pain or fever.     Marland Kitchen amLODipine (NORVASC) 5 MG tablet Take 10 mg by mouth daily.   3  . aspirin 81 MG tablet Take 81 mg by mouth daily.    Marland Kitchen atorvastatin (LIPITOR) 40 MG tablet Take 1 tablet (40 mg total) by mouth daily at 6 PM.    . carvedilol (COREG) 3.125 MG tablet Take 1 tablet (3.125 mg total) by mouth 2 (two) times daily with a meal.    . clopidogrel (PLAVIX) 75 MG tablet Take 1 tablet (75 mg total) by mouth daily.    . diphenhydrAMINE (BENADRYL) 25 mg capsule Take 1 capsule (25 mg total) by mouth every 6 (six) hours as needed for itching, allergies or sleep (prn sleep if Ambien ineffective). 30 capsule 0  . ferrous gluconate (FERGON) 324 MG tablet Take 1 tablet (324 mg total) by mouth daily with breakfast.  3  . ferrous sulfate (SLOW FE) 160 (50 Fe) MG TBCR SR tablet Take 1 tablet (160 mg total) by mouth daily. 30 each 3  . glipiZIDE (GLUCOTROL) 5 MG tablet TAKE 1/2 TABLET BY MOUTH DAILY BEFORE BREAKFAST 15 tablet 0  . insulin aspart (NOVOLOG) 100 UNIT/ML injection Inject 0-9 Units into the skin 3 (three) times daily with meals. 10 mL 11  .  lactobacillus acidophilus & bulgar (LACTINEX) chewable tablet Chew 1 tablet by mouth 3 (three) times daily with meals. 30 tablet 0  . nitroGLYCERIN (NITROSTAT) 0.4 MG SL tablet Place 1 tablet (0.4 mg total) under the tongue every 5 (five) minutes as needed for chest pain.  12  . ondansetron (ZOFRAN ODT) 4 MG disintegrating tablet Take 1 tablet (4 mg total) by mouth every 8 (eight) hours as needed for nausea or vomiting. 20 tablet 0  . traMADol (ULTRAM) 50 MG tablet Take 1 tablet (50 mg total) by mouth every 6 (six) hours as needed for moderate pain or severe pain. 15 tablet 0  . zolpidem (AMBIEN CR) 12.5 MG CR tablet Take 1 tablet (12.5 mg total) by mouth at bedtime as needed for sleep. 30 tablet 4  . amoxicillin-clavulanate (AUGMENTIN) 500-125 MG tablet Take 1 tablet (500 mg total) by mouth 2 (two) times daily. 14 tablet 0   No current facility-administered medications for this visit.     Neurologic: Headache: No Seizure: No Paresthesias:NA  Musculoskeletal: Strength & Muscle Tone: Normal Gait & Station: Normal Patient leans: N/A  Psychiatric Specialty Exam: ROS  Blood pressure (!) 159/65, pulse 63, height 5\' 1"  (1.549 m), weight 128 lb (58.1 kg), SpO2 100 %.Body mass index is 24.19 kg/m.  General Appearance: Well Groomed  Eye Contact:  Good  Speech:  Clear and Coherent  Volume:  Normal  Mood:  Euthymic  Affect:  Appropriate  Thought Process:  Goal Directed  Orientation:  NA  Thought Content:  Logical  Suicidal Thoughts:  No  Homicidal Thoughts:  No  Memory:  NA  Judgement:  Good  Insight:  Good  Psychomotor Activity:  Normal  Concentration:  Concentration: Good  Recall:  Good  Fund of Knowledge:Good  Language: Negative  Akathisia:  No  Handed:  Right  AIMS (if indicated):    Assets:  Desire for Improvement  ADL's:  Intact  Cognition: WNL  Sleep:  Abnormal , problems maintaining     Treatment Plan Summary: Medication management At this time is been no changes.   Her #1 problem is uncomplicated bereavement.  The patient does not have indications to be on an antidepressant.  Her second problem is that of insomnia.  For that she takes Ambien and does very well.  Her uncomplicated bereavement she continues to talk and think about her ex-husband John.  At this time there is no particular intervention simply time.  This patient she will return to see me in 4 to 5 months.  She is not suicidal.  She continues to sleep and eat well.  She has no physical complaints at this time.

## 2019-02-20 ENCOUNTER — Other Ambulatory Visit (HOSPITAL_COMMUNITY): Payer: Self-pay

## 2019-02-20 MED ORDER — ZOLPIDEM TARTRATE ER 12.5 MG PO TBCR: 12.5000 mg | EXTENDED_RELEASE_TABLET | Freq: Every evening | ORAL | 4 refills | Status: DC | PRN

## 2019-02-21 ENCOUNTER — Other Ambulatory Visit (HOSPITAL_COMMUNITY): Payer: Self-pay

## 2019-02-21 MED ORDER — ZOLPIDEM TARTRATE ER 12.5 MG PO TBCR: 12.5000 mg | EXTENDED_RELEASE_TABLET | Freq: Every evening | ORAL | 4 refills | Status: DC | PRN

## 2019-02-26 ENCOUNTER — Telehealth: Payer: Self-pay | Admitting: *Deleted

## 2019-02-26 NOTE — Telephone Encounter (Signed)
   Primary Cardiologist: Quay Burow, MD  Chart reviewed as part of pre-operative protocol coverage. Patient was contacted 02/26/2019 in reference to pre-operative risk assessment for pending surgery as outlined below.  Gabrielle [name] was last seen on 07/24/2018 by Rosaria Ferries, PA.  The patient had NSTEMI and stenting of the LAD and orbital atherectomy with stent to the RCA 07/16/2018. She also had post-procedure CVA. She also has PVD, DM, CKD III-IV. With her considerable history and recent stenting Dr. Gwenlyn Found states that pt cannot hold antiplatelet therapy until August of 2020, 1 year from her DES implantation.   Currently she is having no chest pain or shortness of breath. She feels that she is doing very well with none of the symptoms of "just not feeling right" that she had with her MI.   Therefore, she is cleared for the procedure if DAPT does not need to be interrupted.   If the procedure cannot be done on Plavix and aspirin, it will need to be deferred until after 07/2019.   I discussed this with the patient and her daughter and instructed pt not to interrupt aspirin and Plavix for any reason without being cleared by Dr. Gwenlyn Found.   I will route this recommendation to the requesting party via Epic fax function and remove from pre-op pool.  Please call with questions.  Daune Perch, NP 02/26/2019, 5:41 PM     Daune Perch, NP 02/26/2019, 5:23 PM

## 2019-02-26 NOTE — Telephone Encounter (Signed)
   [facility] Pre-operative Risk Assessment    Request for surgical clearance:  1. What type of surgery is being performed? Bilateral upper eyelid blepharoptosis repair   2. When is this surgery scheduled? 03/11/2019   3. What type of clearance is required (medical clearance vs. Pharmacy clearance to hold med vs. Both)? both  4. Are there any medications that need to be held prior to surgery and how long? Aspirin and plavix-they need direction   5. Practice name and name of physician performing surgery? [facility]-Dr Megan Mans   6. What is your office phone number [phone]    7.   What is your office fax number [phone]  8.   Anesthesia type (None, local, MAC, general) ?  Sharyl Nimrod 02/26/2019, 10:29 AM  _________________________________________________________________   (provider comments below)

## 2019-02-26 NOTE — Telephone Encounter (Signed)
Dr. Gwenlyn Found, this pt has extensive hx of vasculopathy, most recent was 07/13/18 NSTEMI, s/p DES LAD, DES w/ orbital atherectomy RCA, post-proc CVA. Now on aspirin and plavix. She is planning for upper eyelid blepharoptosis repair and they are requesting to hold aspirin and Plavix.   Please comment on holding DAPT.   Please route response back to [facility] PREOP  Thanks, Gae Bon

## 2019-02-26 NOTE — Telephone Encounter (Signed)
Patient cannot hold antiplatelet therapy until August of this year, 1 year from her DES implantation.

## 2019-02-27 DIAGNOSIS — D500 Iron deficiency anemia secondary to blood loss (chronic): Secondary | ICD-10-CM | POA: Diagnosis not present

## 2019-02-27 DIAGNOSIS — I130 Hypertensive heart and chronic kidney disease with heart failure and stage 1 through stage 4 chronic kidney disease, or unspecified chronic kidney disease: Secondary | ICD-10-CM | POA: Diagnosis not present

## 2019-02-27 DIAGNOSIS — I639 Cerebral infarction, unspecified: Secondary | ICD-10-CM | POA: Diagnosis not present

## 2019-02-27 DIAGNOSIS — Z955 Presence of coronary angioplasty implant and graft: Secondary | ICD-10-CM | POA: Diagnosis not present

## 2019-02-27 DIAGNOSIS — I10 Essential (primary) hypertension: Secondary | ICD-10-CM | POA: Diagnosis not present

## 2019-02-27 DIAGNOSIS — F0390 Unspecified dementia without behavioral disturbance: Secondary | ICD-10-CM | POA: Diagnosis not present

## 2019-02-27 DIAGNOSIS — N189 Chronic kidney disease, unspecified: Secondary | ICD-10-CM | POA: Diagnosis not present

## 2019-02-27 DIAGNOSIS — I5030 Unspecified diastolic (congestive) heart failure: Secondary | ICD-10-CM | POA: Diagnosis not present

## 2019-02-27 DIAGNOSIS — Z862 Personal history of diseases of the blood and blood-forming organs and certain disorders involving the immune mechanism: Secondary | ICD-10-CM | POA: Diagnosis not present

## 2019-02-27 DIAGNOSIS — E538 Deficiency of other specified B group vitamins: Secondary | ICD-10-CM | POA: Diagnosis not present

## 2019-02-27 DIAGNOSIS — F5101 Primary insomnia: Secondary | ICD-10-CM | POA: Diagnosis not present

## 2019-02-27 DIAGNOSIS — I119 Hypertensive heart disease without heart failure: Secondary | ICD-10-CM | POA: Diagnosis not present

## 2019-02-27 DIAGNOSIS — E1122 Type 2 diabetes mellitus with diabetic chronic kidney disease: Secondary | ICD-10-CM | POA: Diagnosis not present

## 2019-02-27 DIAGNOSIS — C649 Malignant neoplasm of unspecified kidney, except renal pelvis: Secondary | ICD-10-CM | POA: Diagnosis not present

## 2019-02-27 DIAGNOSIS — N184 Chronic kidney disease, stage 4 (severe): Secondary | ICD-10-CM | POA: Diagnosis not present

## 2019-02-27 DIAGNOSIS — E119 Type 2 diabetes mellitus without complications: Secondary | ICD-10-CM | POA: Diagnosis not present

## 2019-02-27 DIAGNOSIS — F0150 Vascular dementia without behavioral disturbance: Secondary | ICD-10-CM | POA: Diagnosis not present

## 2019-02-27 DIAGNOSIS — Z85528 Personal history of other malignant neoplasm of kidney: Secondary | ICD-10-CM | POA: Diagnosis not present

## 2019-02-27 DIAGNOSIS — I2510 Atherosclerotic heart disease of native coronary artery without angina pectoris: Secondary | ICD-10-CM | POA: Diagnosis not present

## 2019-03-11 DIAGNOSIS — H02834 Dermatochalasis of left upper eyelid: Secondary | ICD-10-CM | POA: Diagnosis not present

## 2019-03-11 DIAGNOSIS — H0279 Other degenerative disorders of eyelid and periocular area: Secondary | ICD-10-CM | POA: Diagnosis not present

## 2019-03-11 DIAGNOSIS — H53453 Other localized visual field defect, bilateral: Secondary | ICD-10-CM | POA: Diagnosis not present

## 2019-03-11 DIAGNOSIS — H02831 Dermatochalasis of right upper eyelid: Secondary | ICD-10-CM | POA: Diagnosis not present

## 2019-03-11 DIAGNOSIS — H02423 Myogenic ptosis of bilateral eyelids: Secondary | ICD-10-CM | POA: Diagnosis not present

## 2019-03-11 DIAGNOSIS — H02413 Mechanical ptosis of bilateral eyelids: Secondary | ICD-10-CM | POA: Diagnosis not present

## 2019-03-11 DIAGNOSIS — H53483 Generalized contraction of visual field, bilateral: Secondary | ICD-10-CM | POA: Diagnosis not present

## 2019-05-19 DIAGNOSIS — Z87891 Personal history of nicotine dependence: Secondary | ICD-10-CM | POA: Diagnosis not present

## 2019-05-19 DIAGNOSIS — Z888 Allergy status to other drugs, medicaments and biological substances status: Secondary | ICD-10-CM | POA: Diagnosis not present

## 2019-05-19 DIAGNOSIS — Z7982 Long term (current) use of aspirin: Secondary | ICD-10-CM | POA: Diagnosis not present

## 2019-05-19 DIAGNOSIS — N189 Chronic kidney disease, unspecified: Secondary | ICD-10-CM | POA: Diagnosis not present

## 2019-05-19 DIAGNOSIS — K529 Noninfective gastroenteritis and colitis, unspecified: Secondary | ICD-10-CM | POA: Diagnosis not present

## 2019-05-19 DIAGNOSIS — I252 Old myocardial infarction: Secondary | ICD-10-CM | POA: Diagnosis not present

## 2019-05-19 DIAGNOSIS — I129 Hypertensive chronic kidney disease with stage 1 through stage 4 chronic kidney disease, or unspecified chronic kidney disease: Secondary | ICD-10-CM | POA: Diagnosis not present

## 2019-06-10 IMAGING — DX DG TIBIA/FIBULA 2V*R*
2 series · 2 of 2 positions shown · non-contrast
Comparison: Coronal and sagittal CT images through the pelvis and
right hip dated June 28, 2016.

CLINICAL DATA: Hip trauma with subsequent pain.

EXAM:
RIGHT TIBIA AND FIBULA - 2 VIEW; DG HIP (WITH OR WITHOUT PELVIS)
2-3V RIGHT

[tibia ap]
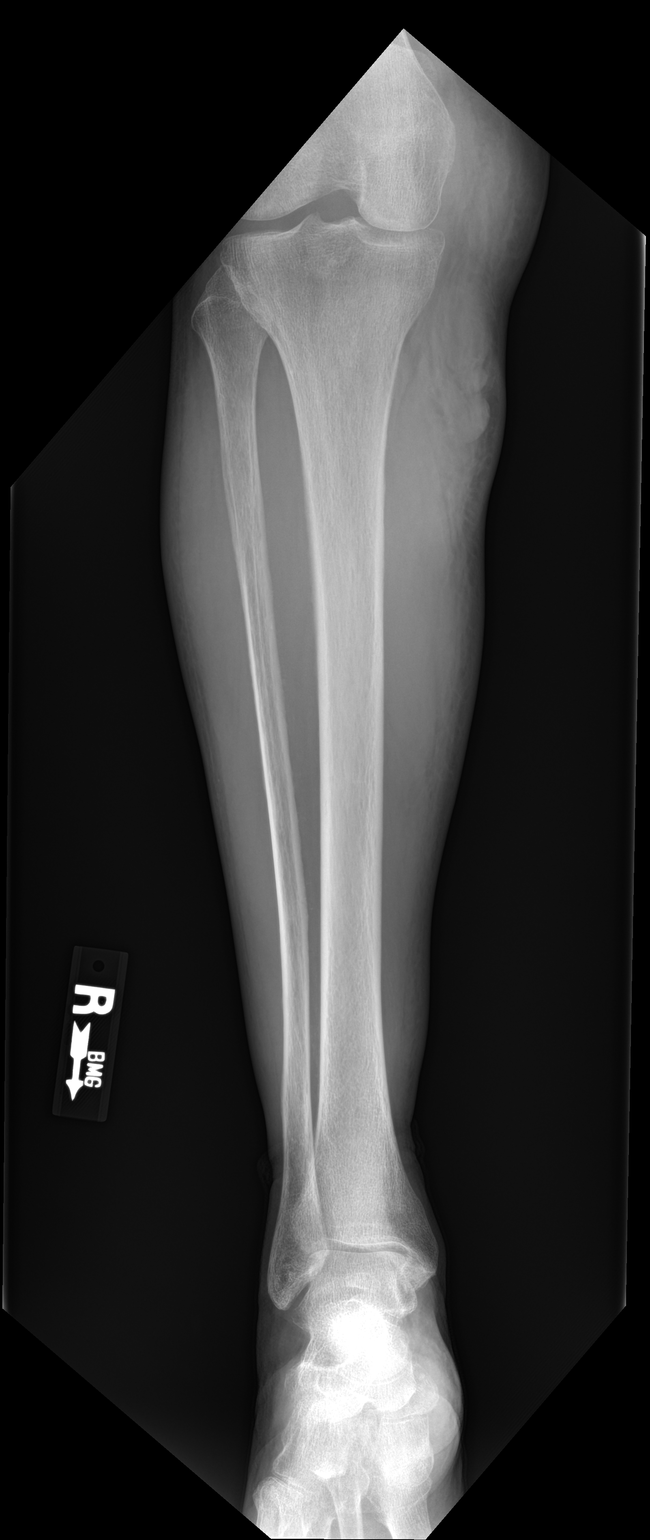

[tibia lat]
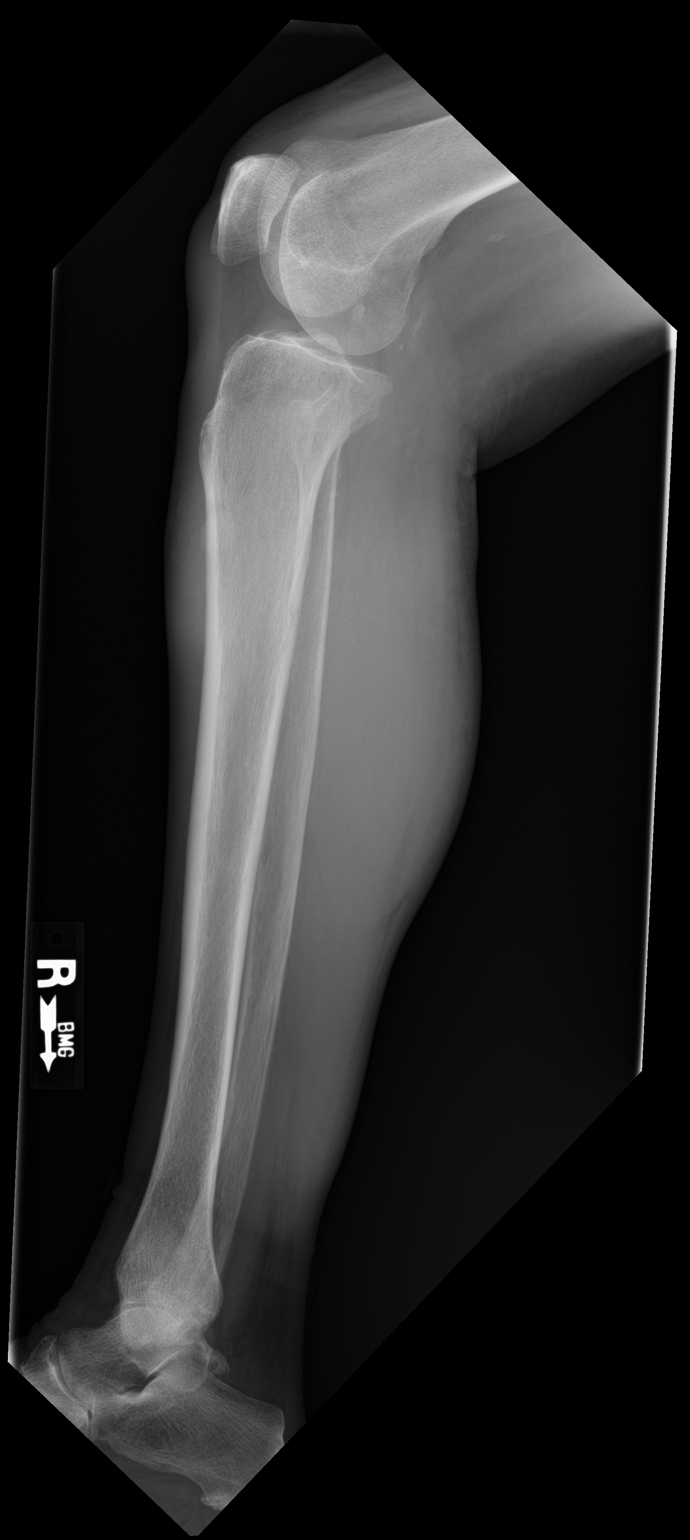

[2 of 2 positions shown; findings below may reference images not displayed]

FINDINGS: Right hip: The bones are subjectively adequately mineralized. There
is no lytic or blastic lesion. AP and lateral views of the right hip
reveal preservation of the joint space. The femoral head, neck,
intertrochanteric, and subtrochanteric regions are normal.

Right tibia and fibula: The bones are subjectively adequately
mineralized. There is no acute fracture or dislocation. There is no
lytic or blastic lesion or periosteal reaction. The observed
portions of the right knee are normal. There is mild narrowing of
the ankle joint mortise. The soft tissues are unremarkable.
IMPRESSION: There is no acute or significant chronic bony abnormality of the
right hip.

There is no acute or significant chronic bony abnormality of the
right tibia or fibula.

## 2019-06-12 ENCOUNTER — Ambulatory Visit (INDEPENDENT_AMBULATORY_CARE_PROVIDER_SITE_OTHER): Payer: Medicare Other | Admitting: Psychiatry

## 2019-06-12 ENCOUNTER — Other Ambulatory Visit: Payer: Self-pay

## 2019-06-12 DIAGNOSIS — F5101 Primary insomnia: Secondary | ICD-10-CM | POA: Diagnosis not present

## 2019-06-12 MED ORDER — ZOLPIDEM TARTRATE ER 12.5 MG PO TBCR: 12.5000 mg | EXTENDED_RELEASE_TABLET | Freq: Every evening | ORAL | 5 refills | Status: DC | PRN

## 2019-06-12 NOTE — Addendum Note (Signed)
Addended by: Norma Fredrickson on: 06/12/2019 05:02 PM   Modules accepted: Orders

## 2019-06-12 NOTE — Progress Notes (Signed)
Psychiatric Initial Adult Assessment   Patient Identification: Gabrielle [name] MRN:  [id number] Date of Evaluation:  06/12/2019 Referral Source: Dr. [name] Chief Complaint:   Visit Diagnosis: Adjustment disorder with depression  Today the patient is doing well.  She is handling the pandemic well.  She watches a lot of TV.  She does have some physical problems.  She has chronic tinnitus.  Having the Ambien to take at night is very helpful.  She denies daily depression.  She is not all that anxious.  She is functioning very well.  She lives with her daughter.  The patient has 5 grandchildren.  All of them seem to be doing well.  The patient denies the use of alcohol or any drugs.  Medically she is stable.  She denies chest pain shortness of breath or any signs of infection.  She denies any neurological symptoms.  The patient continues a long recovery from the death of her husband Gabrielle [name] many years ago.  Also years before that her son died of a heart attack.  Her only living child is who she lives with now.  The patient seems relatively content.  She is not lonely or isolated.  She seems to make good use of her time.  (Hypo) Manic Symptoms:   Anxiety Symptoms:   Psychotic Symptoms:   PTSD Symptoms:   Past Psychiatric History: Negative  Previous Psychotropic Medications: Yes   Substance Abuse History in the last 12 months:  No.  Consequences of Substance Abuse: NA  Past Medical History:  Past Medical History:  Diagnosis Date  . Anemia    hx  . Anxiety   . Arthritis   . Azotemia   . Carotid artery disease ([facility])   . Claudication ([facility])   . Coronary artery disease   . CVA (cerebral vascular accident) ([place] [facility]) 2008   LOC DIZZINESS ; "mini stroke" (07/12/2018)  . Depression   . Essential hypertension, benign   . History of gout   . History of kidney stones   . Hyperlipidemia   . Migraine    "years ago; due to vision issues; got glasses & they went away" (07/12/2018)  . Neck pain   .  NSTEMI (non-ST elevated myocardial infarction) ([facility]) 07/11/2018  . PAD (peripheral artery disease) ([facility])    hx. LCEA, hx Lt renal art. stenting, occl rt renal artery, occluded bil SFAs, moderatie iliac disease,    . Renal cell cancer ([facility]) 2001   "left"  . Seizure ([facility])    pt does not recall this hx on 07/12/2018  . Type II diabetes mellitus ([name]) 1990    Past Surgical History:  Procedure Laterality Date  . ABDOMINAL HYSTERECTOMY    . BACK SURGERY    . CAROTID ENDARTERECTOMY Left   . CATARACT EXTRACTION W/ INTRAOCULAR LENS  IMPLANT, BILATERAL Bilateral   . CORONARY ATHERECTOMY N/A 07/16/2018   Procedure: CORONARY ATHERECTOMY;  Surgeon: Jettie Booze, MD;  Location: [facility];  Service: Cardiovascular;  Laterality: N/A;  . CORONARY STENT INTERVENTION  07/16/2018  . CORONARY STENT INTERVENTION N/A 07/16/2018   Procedure: CORONARY STENT INTERVENTION;  Surgeon: Jettie Booze, MD;  Location: [facility];  Service: Cardiovascular;  Laterality: N/A;  . INCISION AND DRAINAGE ABSCESS Right 10/26/2017   Procedure: INCISION AND DRAINAGE HEMATOMA RIGHT SHIN;  Surgeon: Georganna Skeans, MD;  Location: [facility];  Service: General;  Laterality: Right;  . LEFT HEART CATH AND CORONARY ANGIOGRAPHY N/A 07/13/2018   Procedure: LEFT HEART  CATH AND CORONARY ANGIOGRAPHY;  Surgeon: Jettie Booze, MD;  Location: [facility];  Service: Cardiovascular;  Laterality: N/A;  . LEFT HEART CATH AND CORONARY ANGIOGRAPHY N/A 07/16/2018   Procedure: LEFT HEART CATH AND CORONARY ANGIOGRAPHY;  Surgeon: Jettie Booze, MD;  Location: [facility];  Service: Cardiovascular;  Laterality: N/A;  . LUMBAR [place]    . NEPHRECTOMY Right   . NM MYOCAR PERF WALL MOTION  07/12/2011   normal  . PV angiogram  2004   Lt renal artery stent  . TEMPORARY PACEMAKER N/A 07/16/2018   Procedure: TEMPORARY PACEMAKER;  Surgeon: Jettie Booze, MD;  Location: [facility];  Service:  Cardiovascular;  Laterality: N/A;  . TONSILLECTOMY    . URETERAL STENT PLACEMENT Left     Family Psychiatric History:   Family History:  Family History  Problem Relation Age of Onset  . Cancer Mother   . Heart disease Father     Social History:   Social History   Socioeconomic History  . Marital status: Widowed    Spouse name: Not on file  . Number of children: 2  . Years of education: Not on file  . Highest education level: Not on file  Occupational History  . Occupation: Retired    Fish farm manager: RETIRED  Social Needs  . Financial resource strain: Not on file  . Food insecurity    Worry: Not on file    Inability: Not on file  . Transportation needs    Medical: Not on file    Non-medical: Not on file  Tobacco Use  . Smoking status: Former Smoker    Packs/day: 1.00    Years: 50.00    Pack years: 50.00    Types: Cigarettes    Quit date: 12/26/2005    Years since quitting: 13.4  . Smokeless tobacco: Never Used  Substance and Sexual Activity  . Alcohol use: Never    Frequency: Never  . Drug use: Never  . Sexual activity: Not Currently  Lifestyle  . Physical activity    Days per week: Not on file    Minutes per session: Not on file  . Stress: Not on file  Relationships  . Social Herbalist on phone: Not on file    Gets together: Not on file    Attends religious service: Not on file    Active member of club or organization: Not on file    Attends meetings of clubs or organizations: Not on file    Relationship status: Not on file  Other Topics Concern  . Not on file  Social History Narrative  . Not on file    Additional Social History:   Allergies:   Allergies  Allergen Reactions  . Codeine Nausea And Vomiting  . Fentanyl Other (See Comments)    unknown  . Lacosamide Other (See Comments)     Other name is, VIMPAT unknown  . Levetiracetam Other (See Comments)    Strange feelings in head  . Metformin And Related Other (See Comments)     unknown  . Morphine And Related Itching  . Oxybutynin Swelling    mouth  . Sertraline Nausea Only and Other (See Comments)    Swelling in mouth  . Tessalon [Benzonatate] Other (See Comments)    unknown  . Other Rash    BETA BLOCKER    Metabolic Disorder Labs: Lab Results  Component Value Date   HGBA1C 5.6 07/14/2018  MPG 114.02 07/14/2018   MPG 128 (H) 07/30/2010   No results found for: PROLACTIN Lab Results  Component Value Date   CHOL 186 09/25/2017   TRIG 124 09/25/2017   HDL 43 09/25/2017   CHOLHDL 4.3 09/25/2017   VLDL 25 05/03/2016   LDLCALC 118 (H) 09/25/2017   LDLCALC 59 05/03/2016     Current Medications: Current Outpatient Medications  Medication Sig Dispense Refill  . acetaminophen (TYLENOL) 325 MG tablet Take 2 tablets (650 mg total) by mouth every 6 (six) hours as needed for mild pain (or Fever >/= 101). 30 tablet 0  . acetaminophen (TYLENOL) 500 MG tablet Take 1,000 mg by mouth every 6 (six) hours as needed for mild pain or fever.     Marland Kitchen amLODipine (NORVASC) 5 MG tablet Take 10 mg by mouth daily.   3  . amoxicillin-clavulanate (AUGMENTIN) 500-125 MG tablet Take 1 tablet (500 mg total) by mouth 2 (two) times daily. 14 tablet 0  . aspirin 81 MG tablet Take 81 mg by mouth daily.    Marland Kitchen atorvastatin (LIPITOR) 40 MG tablet Take 1 tablet (40 mg total) by mouth daily at 6 PM.    . carvedilol (COREG) 3.125 MG tablet Take 1 tablet (3.125 mg total) by mouth 2 (two) times daily with a meal.    . clopidogrel (PLAVIX) 75 MG tablet Take 1 tablet (75 mg total) by mouth daily.    . diphenhydrAMINE (BENADRYL) 25 mg capsule Take 1 capsule (25 mg total) by mouth every 6 (six) hours as needed for itching, allergies or sleep (prn sleep if Ambien ineffective). 30 capsule 0  . ferrous gluconate (FERGON) 324 MG tablet Take 1 tablet (324 mg total) by mouth daily with breakfast.  3  . ferrous sulfate (SLOW FE) 160 (50 Fe) MG TBCR SR tablet Take 1 tablet (160 mg total) by mouth daily.  30 each 3  . glipiZIDE (GLUCOTROL) 5 MG tablet TAKE 1/2 TABLET BY MOUTH DAILY BEFORE BREAKFAST 15 tablet 0  . insulin aspart (NOVOLOG) 100 UNIT/ML injection Inject 0-9 Units into the skin 3 (three) times daily with meals. 10 mL 11  . lactobacillus acidophilus & bulgar (LACTINEX) chewable tablet Chew 1 tablet by mouth 3 (three) times daily with meals. 30 tablet 0  . nitroGLYCERIN (NITROSTAT) 0.4 MG SL tablet Place 1 tablet (0.4 mg total) under the tongue every 5 (five) minutes as needed for chest pain.  12  . ondansetron (ZOFRAN ODT) 4 MG disintegrating tablet Take 1 tablet (4 mg total) by mouth every 8 (eight) hours as needed for nausea or vomiting. 20 tablet 0  . traMADol (ULTRAM) 50 MG tablet Take 1 tablet (50 mg total) by mouth every 6 (six) hours as needed for moderate pain or severe pain. 15 tablet 0  . zolpidem (AMBIEN CR) 12.5 MG CR tablet Take 1 tablet (12.5 mg total) by mouth at bedtime as needed for sleep. 30 tablet 5   No current facility-administered medications for this visit.     Neurologic: Headache: No Seizure: No Paresthesias:NA  Musculoskeletal: Strength & Muscle Tone: Normal Gait & Station: Normal Patient leans: N/A  Psychiatric Specialty Exam: ROS  There were no vitals taken for this visit.There is no height or weight on file to calculate BMI.  General Appearance: Well Groomed  Eye Contact:  Good  Speech:  Clear and Coherent  Volume:  Normal  Mood:  Euthymic  Affect:  Appropriate  Thought Process:  Goal Directed  Orientation:  NA  Thought  Content:  Logical  Suicidal Thoughts:  No  Homicidal Thoughts:  No  Memory:  NA  Judgement:  Good  Insight:  Good  Psychomotor Activity:  Normal  Concentration:  Concentration: Good  Recall:  Good  Fund of Knowledge:Good  Language: Negative  Akathisia:  No  Handed:  Right  AIMS (if indicated):    Assets:  Desire for Improvement  ADL's:  Intact  Cognition: WNL  Sleep:  Abnormal , problems maintaining      Treatment Plan Summary: Medication management  The patient's only problem is that of primary insomnia.  She is been taking 12.5 CR of Ambien for many years and done very well.  She is had no falls.  She shows no signs of any cognitive changes.  She uses her medication just as prescribed.  She drinks no alcohol uses no drugs.  She is very stable.  Should be seen next 5 months from now hopefully in the office.  She is not suicidal and is functioning very well.

## 2019-08-19 DIAGNOSIS — Z23 Encounter for immunization: Secondary | ICD-10-CM | POA: Diagnosis not present

## 2019-08-30 ENCOUNTER — Telehealth: Payer: Self-pay

## 2019-08-30 ENCOUNTER — Telehealth: Payer: Self-pay | Admitting: Family Medicine

## 2019-08-30 NOTE — Telephone Encounter (Signed)
Med list printed and placed in the mail to go out.

## 2019-08-30 NOTE — Telephone Encounter (Signed)
Copied from [place] [id number]. Topic: General - Other >> Aug 30, 2019 11:14 AM Yvette Rack wrote: Reason for CRM: Pt stated she needs a list of all medications that she is allergic to to bemailed to her home.

## 2019-08-30 NOTE — Telephone Encounter (Signed)
Pt requested Allergies and medication list mailed to her house  [place] [id number]

## 2019-09-06 NOTE — Telephone Encounter (Signed)
I spoke with pt on 09/05/19. I will mail her medication list to the address given.

## 2019-09-11 DIAGNOSIS — Z862 Personal history of diseases of the blood and blood-forming organs and certain disorders involving the immune mechanism: Secondary | ICD-10-CM | POA: Diagnosis not present

## 2019-09-11 DIAGNOSIS — N189 Chronic kidney disease, unspecified: Secondary | ICD-10-CM | POA: Diagnosis not present

## 2019-09-11 DIAGNOSIS — I998 Other disorder of circulatory system: Secondary | ICD-10-CM | POA: Diagnosis not present

## 2019-09-11 DIAGNOSIS — I119 Hypertensive heart disease without heart failure: Secondary | ICD-10-CM | POA: Diagnosis not present

## 2019-09-11 DIAGNOSIS — Z85528 Personal history of other malignant neoplasm of kidney: Secondary | ICD-10-CM | POA: Diagnosis not present

## 2019-09-11 DIAGNOSIS — M48062 Spinal stenosis, lumbar region with neurogenic claudication: Secondary | ICD-10-CM | POA: Diagnosis not present

## 2019-09-11 DIAGNOSIS — I10 Essential (primary) hypertension: Secondary | ICD-10-CM | POA: Diagnosis not present

## 2019-09-11 DIAGNOSIS — E7800 Pure hypercholesterolemia, unspecified: Secondary | ICD-10-CM | POA: Diagnosis not present

## 2019-09-11 DIAGNOSIS — E1122 Type 2 diabetes mellitus with diabetic chronic kidney disease: Secondary | ICD-10-CM | POA: Diagnosis not present

## 2019-09-11 DIAGNOSIS — I639 Cerebral infarction, unspecified: Secondary | ICD-10-CM | POA: Diagnosis not present

## 2019-09-11 DIAGNOSIS — C649 Malignant neoplasm of unspecified kidney, except renal pelvis: Secondary | ICD-10-CM | POA: Diagnosis not present

## 2019-09-11 DIAGNOSIS — D649 Anemia, unspecified: Secondary | ICD-10-CM | POA: Diagnosis not present

## 2019-09-18 DIAGNOSIS — N184 Chronic kidney disease, stage 4 (severe): Secondary | ICD-10-CM | POA: Diagnosis not present

## 2019-09-20 DIAGNOSIS — M5416 Radiculopathy, lumbar region: Secondary | ICD-10-CM | POA: Diagnosis not present

## 2019-10-10 DIAGNOSIS — M5136 Other intervertebral disc degeneration, lumbar region: Secondary | ICD-10-CM | POA: Diagnosis not present

## 2019-10-30 DIAGNOSIS — M545 Low back pain: Secondary | ICD-10-CM | POA: Diagnosis not present

## 2019-11-01 DIAGNOSIS — Z20828 Contact with and (suspected) exposure to other viral communicable diseases: Secondary | ICD-10-CM | POA: Diagnosis not present

## 2019-11-13 ENCOUNTER — Other Ambulatory Visit (HOSPITAL_COMMUNITY): Payer: Self-pay

## 2019-11-13 ENCOUNTER — Ambulatory Visit (INDEPENDENT_AMBULATORY_CARE_PROVIDER_SITE_OTHER): Payer: Medicare Other | Admitting: Psychiatry

## 2019-11-13 ENCOUNTER — Other Ambulatory Visit: Payer: Self-pay

## 2019-11-13 DIAGNOSIS — F5101 Primary insomnia: Secondary | ICD-10-CM

## 2019-11-13 MED ORDER — ZOLPIDEM TARTRATE ER 6.25 MG PO TBCR: 6.2500 mg | EXTENDED_RELEASE_TABLET | Freq: Every evening | ORAL | 3 refills | Status: DC | PRN

## 2019-11-13 MED ORDER — ZOLPIDEM TARTRATE ER 6.25 MG PO TBCR: 12.5000 mg | EXTENDED_RELEASE_TABLET | Freq: Every evening | ORAL | 3 refills | Status: DC | PRN

## 2019-11-13 NOTE — Progress Notes (Signed)
Psychiatric Initial Adult Assessment   Patient Identification: Gabrielle [name] MRN:  [id number] Date of Evaluation:  11/13/2019 Referral Source: Dr. [name] Chief Complaint:   Visit Diagnosis: Adjustment disorder with depression  Today the patient was evaluated alone.  She lives with her daughter and son-in-law.  Her son-in-law is just retired.  She has a conflictual relationship with her daughter which is unfortunate.  She is not depressed and she is eating well.  She sleeps well taking Ambien 12.5 CR.  She admits herself that she is having some problem with memory.  Patient is not psychotic.  She drinks no alcohol of course uses no drugs.  She has no real complaints other than that she does not feel like her environment is the best.  The patient is certainly not suicidal.  (Hypo) Manic Symptoms:   Anxiety Symptoms:   Psychotic Symptoms:   PTSD Symptoms:   Past Psychiatric History: Negative  Previous Psychotropic Medications: Yes   Substance Abuse History in the last 12 months:  No.  Consequences of Substance Abuse: NA  Past Medical History:  Past Medical History:  Diagnosis Date  . Anemia    hx  . Anxiety   . Arthritis   . Azotemia   . Carotid artery disease ([facility])   . Claudication ([facility])   . Coronary artery disease   . CVA (cerebral vascular accident) ([facility]) 2008   LOC DIZZINESS ; "mini stroke" (07/12/2018)  . Depression   . Essential hypertension, benign   . History of gout   . History of kidney stones   . Hyperlipidemia   . Migraine    "years ago; due to vision issues; got glasses & they went away" (07/12/2018)  . Neck pain   . NSTEMI (non-ST elevated myocardial infarction) ([facility]) 07/11/2018  . PAD (peripheral artery disease) ([facility])    hx. LCEA, hx Lt renal art. stenting, occl rt renal artery, occluded bil SFAs, moderatie iliac disease,    . Renal cell cancer ([facility]) 2001   "left"  . Seizure ([facility])    pt does not recall this hx on 07/12/2018  . Type II diabetes mellitus  ([facility]) 1990    Past Surgical History:  Procedure Laterality Date  . ABDOMINAL HYSTERECTOMY    . BACK SURGERY    . CAROTID ENDARTERECTOMY Left   . CATARACT EXTRACTION W/ INTRAOCULAR LENS  IMPLANT, BILATERAL Bilateral   . CORONARY ATHERECTOMY N/A 07/16/2018   Procedure: CORONARY ATHERECTOMY;  Surgeon: Jettie Booze, MD;  Location: [facility];  Service: Cardiovascular;  Laterality: N/A;  . CORONARY STENT INTERVENTION  07/16/2018  . CORONARY STENT INTERVENTION N/A 07/16/2018   Procedure: CORONARY STENT INTERVENTION;  Surgeon: Jettie Booze, MD;  Location: [facility];  Service: Cardiovascular;  Laterality: N/A;  . INCISION AND DRAINAGE ABSCESS Right 10/26/2017   Procedure: INCISION AND DRAINAGE HEMATOMA RIGHT SHIN;  Surgeon: Georganna Skeans, MD;  Location: [facility];  Service: General;  Laterality: Right;  . LEFT HEART CATH AND CORONARY ANGIOGRAPHY N/A 07/13/2018   Procedure: LEFT HEART CATH AND CORONARY ANGIOGRAPHY;  Surgeon: Jettie Booze, MD;  Location: [facility];  Service: Cardiovascular;  Laterality: N/A;  . LEFT HEART CATH AND CORONARY ANGIOGRAPHY N/A 07/16/2018   Procedure: LEFT HEART CATH AND CORONARY ANGIOGRAPHY;  Surgeon: Jettie Booze, MD;  Location: [facility];  Service: Cardiovascular;  Laterality: N/A;  . LUMBAR [place]    . NEPHRECTOMY Right   . NM MYOCAR PERF WALL  MOTION  07/12/2011   normal  . PV angiogram  2004   Lt renal artery stent  . TEMPORARY PACEMAKER N/A 07/16/2018   Procedure: TEMPORARY PACEMAKER;  Surgeon: Jettie Booze, MD;  Location: [facility];  Service: Cardiovascular;  Laterality: N/A;  . TONSILLECTOMY    . URETERAL STENT PLACEMENT Left     Family Psychiatric History:   Family History:  Family History  Problem Relation Age of Onset  . Cancer Mother   . Heart disease Father     Social History:   Social History   Socioeconomic History  . Marital status: Widowed    Spouse name:  Not on file  . Number of children: 2  . Years of education: Not on file  . Highest education level: Not on file  Occupational History  . Occupation: Retired    Fish farm manager: RETIRED  Social Needs  . Financial resource strain: Not on file  . Food insecurity    Worry: Not on file    Inability: Not on file  . Transportation needs    Medical: Not on file    Non-medical: Not on file  Tobacco Use  . Smoking status: Former Smoker    Packs/day: 1.00    Years: 50.00    Pack years: 50.00    Types: Cigarettes    Quit date: 12/26/2005    Years since quitting: 13.8  . Smokeless tobacco: Never Used  Substance and Sexual Activity  . Alcohol use: Never    Frequency: Never  . Drug use: Never  . Sexual activity: Not Currently  Lifestyle  . Physical activity    Days per week: Not on file    Minutes per session: Not on file  . Stress: Not on file  Relationships  . Social Herbalist on phone: Not on file    Gets together: Not on file    Attends religious service: Not on file    Active member of club or organization: Not on file    Attends meetings of clubs or organizations: Not on file    Relationship status: Not on file  Other Topics Concern  . Not on file  Social History Narrative  . Not on file    Additional Social History:   Allergies:   Allergies  Allergen Reactions  . Codeine Nausea And Vomiting  . Fentanyl Other (See Comments)    unknown  . Lacosamide Other (See Comments)     Other name is, VIMPAT unknown  . Levetiracetam Other (See Comments)    Strange feelings in head  . Metformin And Related Other (See Comments)    unknown  . Morphine And Related Itching  . Oxybutynin Swelling    mouth  . Sertraline Nausea Only and Other (See Comments)    Swelling in mouth  . Tessalon [Benzonatate] Other (See Comments)    unknown  . Other Rash    BETA BLOCKER    Metabolic Disorder Labs: Lab Results  Component Value Date   HGBA1C 5.6 07/14/2018   MPG 114.02  07/14/2018   MPG 128 (H) 07/30/2010   No results found for: PROLACTIN Lab Results  Component Value Date   CHOL 186 09/25/2017   TRIG 124 09/25/2017   HDL 43 09/25/2017   CHOLHDL 4.3 09/25/2017   VLDL 25 05/03/2016   LDLCALC 118 (H) 09/25/2017   LDLCALC 59 05/03/2016     Current Medications: Current Outpatient Medications  Medication Sig Dispense Refill  . acetaminophen (TYLENOL)  325 MG tablet Take 2 tablets (650 mg total) by mouth every 6 (six) hours as needed for mild pain (or Fever >/= 101). 30 tablet 0  . acetaminophen (TYLENOL) 500 MG tablet Take 1,000 mg by mouth every 6 (six) hours as needed for mild pain or fever.     Marland Kitchen amLODipine (NORVASC) 5 MG tablet Take 10 mg by mouth daily.   3  . amoxicillin-clavulanate (AUGMENTIN) 500-125 MG tablet Take 1 tablet (500 mg total) by mouth 2 (two) times daily. 14 tablet 0  . aspirin 81 MG tablet Take 81 mg by mouth daily.    Marland Kitchen atorvastatin (LIPITOR) 40 MG tablet Take 1 tablet (40 mg total) by mouth daily at 6 PM.    . carvedilol (COREG) 3.125 MG tablet Take 1 tablet (3.125 mg total) by mouth 2 (two) times daily with a meal.    . clopidogrel (PLAVIX) 75 MG tablet Take 1 tablet (75 mg total) by mouth daily.    . diphenhydrAMINE (BENADRYL) 25 mg capsule Take 1 capsule (25 mg total) by mouth every 6 (six) hours as needed for itching, allergies or sleep (prn sleep if Ambien ineffective). 30 capsule 0  . ferrous gluconate (FERGON) 324 MG tablet Take 1 tablet (324 mg total) by mouth daily with breakfast.  3  . ferrous sulfate (SLOW FE) 160 (50 Fe) MG TBCR SR tablet Take 1 tablet (160 mg total) by mouth daily. 30 each 3  . glipiZIDE (GLUCOTROL) 5 MG tablet TAKE 1/2 TABLET BY MOUTH DAILY BEFORE BREAKFAST 15 tablet 0  . insulin aspart (NOVOLOG) 100 UNIT/ML injection Inject 0-9 Units into the skin 3 (three) times daily with meals. 10 mL 11  . lactobacillus acidophilus & bulgar (LACTINEX) chewable tablet Chew 1 tablet by mouth 3 (three) times daily with  meals. 30 tablet 0  . nitroGLYCERIN (NITROSTAT) 0.4 MG SL tablet Place 1 tablet (0.4 mg total) under the tongue every 5 (five) minutes as needed for chest pain.  12  . ondansetron (ZOFRAN ODT) 4 MG disintegrating tablet Take 1 tablet (4 mg total) by mouth every 8 (eight) hours as needed for nausea or vomiting. 20 tablet 0  . traMADol (ULTRAM) 50 MG tablet Take 1 tablet (50 mg total) by mouth every 6 (six) hours as needed for moderate pain or severe pain. 15 tablet 0  . zolpidem (AMBIEN CR) 6.25 MG CR tablet Take 2 tablets (12.5 mg total) by mouth at bedtime as needed for sleep. 30 tablet 3   No current facility-administered medications for this visit.     Neurologic: Headache: No Seizure: No Paresthesias:NA  Musculoskeletal: Strength & Muscle Tone: Normal Gait & Station: Normal Patient leans: N/A  Psychiatric Specialty Exam: ROS  There were no vitals taken for this visit.There is no height or weight on file to calculate BMI.  General Appearance: Well Groomed  Eye Contact:  Good  Speech:  Clear and Coherent  Volume:  Normal  Mood:  Euthymic  Affect:  Appropriate  Thought Process:  Goal Directed  Orientation:  NA  Thought Content:  Logical  Suicidal Thoughts:  No  Homicidal Thoughts:  No  Memory:  NA  Judgement:  Good  Insight:  Good  Psychomotor Activity:  Normal  Concentration:  Concentration: Good  Recall:  Good  Fund of Knowledge:Good  Language: Negative  Akathisia:  No  Handed:  Right  AIMS (if indicated):    Assets:  Desire for Improvement  ADL's:  Intact  Cognition: WNL  Sleep:  Abnormal , problems maintaining     Treatment Plan Summary: Medication management  This patient is #1 problem is that of insomnia.  Most likely that a primary insomnia.  At this time we will go ahead and reduce her Ambien from 12.5 down to 6.25 CR.  We will ask her to come in the office in about 2 months we will do a memory assessment and reevaluate this patient.  She has had no falls  according to her.  Medically she is stable.

## 2019-11-15 DIAGNOSIS — M5136 Other intervertebral disc degeneration, lumbar region: Secondary | ICD-10-CM | POA: Diagnosis not present

## 2019-11-19 DIAGNOSIS — I130 Hypertensive heart and chronic kidney disease with heart failure and stage 1 through stage 4 chronic kidney disease, or unspecified chronic kidney disease: Secondary | ICD-10-CM | POA: Diagnosis not present

## 2019-11-19 DIAGNOSIS — Z87891 Personal history of nicotine dependence: Secondary | ICD-10-CM | POA: Diagnosis not present

## 2019-11-19 DIAGNOSIS — Z885 Allergy status to narcotic agent status: Secondary | ICD-10-CM | POA: Diagnosis not present

## 2019-11-19 DIAGNOSIS — N041 Nephrotic syndrome with focal and segmental glomerular lesions: Secondary | ICD-10-CM | POA: Diagnosis not present

## 2019-11-19 DIAGNOSIS — Z79899 Other long term (current) drug therapy: Secondary | ICD-10-CM | POA: Diagnosis not present

## 2019-11-19 DIAGNOSIS — E785 Hyperlipidemia, unspecified: Secondary | ICD-10-CM | POA: Diagnosis not present

## 2019-11-19 DIAGNOSIS — E1122 Type 2 diabetes mellitus with diabetic chronic kidney disease: Secondary | ICD-10-CM | POA: Diagnosis not present

## 2019-11-19 DIAGNOSIS — N184 Chronic kidney disease, stage 4 (severe): Secondary | ICD-10-CM | POA: Diagnosis not present

## 2019-11-19 DIAGNOSIS — Z7984 Long term (current) use of oral hypoglycemic drugs: Secondary | ICD-10-CM | POA: Diagnosis not present

## 2019-11-19 DIAGNOSIS — Z955 Presence of coronary angioplasty implant and graft: Secondary | ICD-10-CM | POA: Diagnosis not present

## 2019-11-19 DIAGNOSIS — Z888 Allergy status to other drugs, medicaments and biological substances status: Secondary | ICD-10-CM | POA: Diagnosis not present

## 2019-11-19 DIAGNOSIS — F39 Unspecified mood [affective] disorder: Secondary | ICD-10-CM | POA: Diagnosis not present

## 2019-11-19 DIAGNOSIS — F0390 Unspecified dementia without behavioral disturbance: Secondary | ICD-10-CM | POA: Diagnosis not present

## 2019-11-19 DIAGNOSIS — I509 Heart failure, unspecified: Secondary | ICD-10-CM | POA: Diagnosis not present

## 2019-11-19 DIAGNOSIS — R82998 Other abnormal findings in urine: Secondary | ICD-10-CM | POA: Diagnosis not present

## 2019-11-19 DIAGNOSIS — Z8673 Personal history of transient ischemic attack (TIA), and cerebral infarction without residual deficits: Secondary | ICD-10-CM | POA: Diagnosis not present

## 2019-11-19 DIAGNOSIS — I252 Old myocardial infarction: Secondary | ICD-10-CM | POA: Diagnosis not present

## 2019-11-26 DIAGNOSIS — M5136 Other intervertebral disc degeneration, lumbar region: Secondary | ICD-10-CM | POA: Diagnosis not present

## 2019-11-26 DIAGNOSIS — M5416 Radiculopathy, lumbar region: Secondary | ICD-10-CM | POA: Diagnosis not present

## 2019-12-04 ENCOUNTER — Telehealth (HOSPITAL_COMMUNITY): Payer: Self-pay | Admitting: *Deleted

## 2019-12-04 NOTE — Telephone Encounter (Signed)
Pt called c/o that she needs a refill of her Ambien she says should be on 12.5mg . Pt currently prescribed 6.25mg  CR.  Treatment Plan Summary: from 11/13/19 visit.  Medication management  This patient is #1 problem is that of insomnia.  Most likely that a primary insomnia.  At this time we will go ahead and reduce her Ambien from 12.5 down to 6.25 CR.  We will ask her to come in the office in about 2 months we will do a memory assessment and reevaluate this patient.  She has had no falls according to her.  Medically she is stable.prescribed 6.25mg  CR.

## 2019-12-05 ENCOUNTER — Other Ambulatory Visit (HOSPITAL_COMMUNITY): Payer: Self-pay | Admitting: *Deleted

## 2019-12-05 NOTE — Progress Notes (Unsigned)
Ambien increased  to 12.5mg  from 6.5mg  [facility] PRN.

## 2020-01-22 ENCOUNTER — Ambulatory Visit (INDEPENDENT_AMBULATORY_CARE_PROVIDER_SITE_OTHER): Payer: Medicare Other | Admitting: Psychiatry

## 2020-01-22 ENCOUNTER — Other Ambulatory Visit: Payer: Self-pay

## 2020-01-22 DIAGNOSIS — F5101 Primary insomnia: Secondary | ICD-10-CM | POA: Diagnosis not present

## 2020-01-22 MED ORDER — ZOLPIDEM TARTRATE ER 6.25 MG PO TBCR: 6.2500 mg | EXTENDED_RELEASE_TABLET | Freq: Every evening | ORAL | 4 refills | Status: DC | PRN

## 2020-01-22 NOTE — Progress Notes (Signed)
Psychiatric Initial Adult Assessment   Patient Identification: Gabrielle [name] MRN:  [id number] Date of Evaluation:  01/22/2020 Referral Source: Dr. [name] Chief Complaint:   Visit Diagnosis: Adjustment disorder with depression  Today the patient seems to be at her baseline.  I do not think she is emotionally well.  She feels isolated and lonely.  She lives with her daughter and son-in-law.  Her son-in-law is retired.  Her grandson lives there as well and has no contact with her.  The patient described herself as being very lonely and isolated.  Today when doing memory evaluation but we could not do that today.  Patient sees a vascular dementia doctor but there is no notes written.  She is reduced her Ambien from 12.5 mg down to 6.25.  She says it is essential for her to get a good night sleep.  She has very poor sleep hygiene.  She sleeps the TV when all the time.  She says a lot of time she has to wake up to urinate and cannot get back to sleep.  It is noted that she has no real daytime dysfunction.  She does not take naps and she is not really sleepy.  It seems like the only time that she get some peace and comfort is when she sleeps at night.  This low-dose of Ambien seems to help.  I have no believe that Ambien has any effect on her cognitive status.  The patient is agreed to return to see me in a few months to come see me in person.  I will make an effort to make contact with her daughter once the patient sees me.  It is obvious the patient needs some more stimulation and activity.  She actually is eating well does not drink any alcohol and demonstrates no evidence of psychosis.  She is functioning only fairly well. (Hypo) Manic Symptoms:   Anxiety Symptoms:   Psychotic Symptoms:   PTSD Symptoms:   Past Psychiatric History: Negative  Previous Psychotropic Medications: Yes   Substance Abuse History in the last 12 months:  No.  Consequences of Substance Abuse: NA  Past Medical History:   Past Medical History:  Diagnosis Date  . Anemia    hx  . Anxiety   . Arthritis   . Azotemia   . Carotid artery disease ([place])   . Claudication ([place])   . Coronary artery disease   . CVA (cerebral vascular accident) ([place]) 2008   LOC DIZZINESS ; "mini stroke" (07/12/2018)  . Depression   . Essential hypertension, benign   . History of gout   . History of kidney stones   . Hyperlipidemia   . Migraine    "years ago; due to vision issues; got glasses & they went away" (07/12/2018)  . Neck pain   . NSTEMI (non-ST elevated myocardial infarction) ([place]) 07/11/2018  . PAD (peripheral artery disease) ([facility])    hx. LCEA, hx Lt renal art. stenting, occl rt renal artery, occluded bil SFAs, moderatie iliac disease,    . Renal cell cancer ([place]) 2001   "left"  . Seizure ([place])    pt does not recall this hx on 07/12/2018  . Type II diabetes mellitus ([place]) 1990    Past Surgical History:  Procedure Laterality Date  . ABDOMINAL HYSTERECTOMY    . BACK SURGERY    . CAROTID ENDARTERECTOMY Left   . CATARACT EXTRACTION W/ INTRAOCULAR LENS  IMPLANT, BILATERAL Bilateral   . CORONARY ATHERECTOMY N/A 07/16/2018  Procedure: CORONARY ATHERECTOMY;  Surgeon: Jettie Booze, MD;  Location: [facility];  Service: Cardiovascular;  Laterality: N/A;  . CORONARY STENT INTERVENTION  07/16/2018  . CORONARY STENT INTERVENTION N/A 07/16/2018   Procedure: CORONARY STENT INTERVENTION;  Surgeon: Jettie Booze, MD;  Location: [facility];  Service: Cardiovascular;  Laterality: N/A;  . INCISION AND DRAINAGE ABSCESS Right 10/26/2017   Procedure: INCISION AND DRAINAGE HEMATOMA RIGHT SHIN;  Surgeon: Georganna Skeans, MD;  Location: [facility];  Service: General;  Laterality: Right;  . LEFT HEART CATH AND CORONARY ANGIOGRAPHY N/A 07/13/2018   Procedure: LEFT HEART CATH AND CORONARY ANGIOGRAPHY;  Surgeon: Jettie Booze, MD;  Location: [facility];  Service: Cardiovascular;  Laterality: N/A;  . LEFT  HEART CATH AND CORONARY ANGIOGRAPHY N/A 07/16/2018   Procedure: LEFT HEART CATH AND CORONARY ANGIOGRAPHY;  Surgeon: Jettie Booze, MD;  Location: [facility];  Service: Cardiovascular;  Laterality: N/A;  . LUMBAR [place]    . NEPHRECTOMY Right   . NM MYOCAR PERF WALL MOTION  07/12/2011   normal  . PV angiogram  2004   Lt renal artery stent  . TEMPORARY PACEMAKER N/A 07/16/2018   Procedure: TEMPORARY PACEMAKER;  Surgeon: Jettie Booze, MD;  Location: [facility];  Service: Cardiovascular;  Laterality: N/A;  . TONSILLECTOMY    . URETERAL STENT PLACEMENT Left     Family Psychiatric History:   Family History:  Family History  Problem Relation Age of Onset  . Cancer Mother   . Heart disease Father     Social History:   Social History   Socioeconomic History  . Marital status: Widowed    Spouse name: Not on file  . Number of children: 2  . Years of education: Not on file  . Highest education level: Not on file  Occupational History  . Occupation: Retired    Fish farm manager: RETIRED  Tobacco Use  . Smoking status: Former Smoker    Packs/day: 1.00    Years: 50.00    Pack years: 50.00    Types: Cigarettes    Quit date: 12/26/2005    Years since quitting: 14.0  . Smokeless tobacco: Never Used  Substance and Sexual Activity  . Alcohol use: Never  . Drug use: Never  . Sexual activity: Not Currently  Other Topics Concern  . Not on file  Social History Narrative  . Not on file   Social Determinants of Health   Financial Resource Strain:   . Difficulty of Paying Living Expenses: Not on file  Food Insecurity:   . Worried About Charity fundraiser in the Last Year: Not on file  . Ran Out of Food in the Last Year: Not on file  Transportation Needs:   . Lack of Transportation (Medical): Not on file  . Lack of Transportation (Non-Medical): Not on file  Physical Activity:   . Days of Exercise per Week: Not on file  . Minutes of Exercise per Session:  Not on file  Stress:   . Feeling of Stress : Not on file  Social Connections:   . Frequency of Communication with Friends and Family: Not on file  . Frequency of Social Gatherings with Friends and Family: Not on file  . Attends Religious Services: Not on file  . Active Member of Clubs or Organizations: Not on file  . Attends Archivist Meetings: Not on file  . Marital Status: Not on file  Additional Social History:   Allergies:   Allergies  Allergen Reactions  . Codeine Nausea And Vomiting  . Fentanyl Other (See Comments)    unknown  . Lacosamide Other (See Comments)     Other name is, VIMPAT unknown  . Levetiracetam Other (See Comments)    Strange feelings in head  . Metformin And Related Other (See Comments)    unknown  . Morphine And Related Itching  . Oxybutynin Swelling    mouth  . Sertraline Nausea Only and Other (See Comments)    Swelling in mouth  . Tessalon [Benzonatate] Other (See Comments)    unknown  . Other Rash    BETA BLOCKER    Metabolic Disorder Labs: Lab Results  Component Value Date   HGBA1C 5.6 07/14/2018   MPG 114.02 07/14/2018   MPG 128 (H) 07/30/2010   No results found for: PROLACTIN Lab Results  Component Value Date   CHOL 186 09/25/2017   TRIG 124 09/25/2017   HDL 43 09/25/2017   CHOLHDL 4.3 09/25/2017   VLDL 25 05/03/2016   LDLCALC 118 (H) 09/25/2017   LDLCALC 59 05/03/2016     Current Medications: Current Outpatient Medications  Medication Sig Dispense Refill  . acetaminophen (TYLENOL) 325 MG tablet Take 2 tablets (650 mg total) by mouth every 6 (six) hours as needed for mild pain (or Fever >/= 101). 30 tablet 0  . acetaminophen (TYLENOL) 500 MG tablet Take 1,000 mg by mouth every 6 (six) hours as needed for mild pain or fever.     Marland Kitchen amLODipine (NORVASC) 5 MG tablet Take 10 mg by mouth daily.   3  . amoxicillin-clavulanate (AUGMENTIN) 500-125 MG tablet Take 1 tablet (500 mg total) by mouth 2 (two) times daily. 14  tablet 0  . aspirin 81 MG tablet Take 81 mg by mouth daily.    Marland Kitchen atorvastatin (LIPITOR) 40 MG tablet Take 1 tablet (40 mg total) by mouth daily at 6 PM.    . carvedilol (COREG) 3.125 MG tablet Take 1 tablet (3.125 mg total) by mouth 2 (two) times daily with a meal.    . clopidogrel (PLAVIX) 75 MG tablet Take 1 tablet (75 mg total) by mouth daily.    . diphenhydrAMINE (BENADRYL) 25 mg capsule Take 1 capsule (25 mg total) by mouth every 6 (six) hours as needed for itching, allergies or sleep (prn sleep if Ambien ineffective). 30 capsule 0  . ferrous gluconate (FERGON) 324 MG tablet Take 1 tablet (324 mg total) by mouth daily with breakfast.  3  . ferrous sulfate (SLOW FE) 160 (50 Fe) MG TBCR SR tablet Take 1 tablet (160 mg total) by mouth daily. 30 each 3  . glipiZIDE (GLUCOTROL) 5 MG tablet TAKE 1/2 TABLET BY MOUTH DAILY BEFORE BREAKFAST 15 tablet 0  . insulin aspart (NOVOLOG) 100 UNIT/ML injection Inject 0-9 Units into the skin 3 (three) times daily with meals. 10 mL 11  . lactobacillus acidophilus & bulgar (LACTINEX) chewable tablet Chew 1 tablet by mouth 3 (three) times daily with meals. 30 tablet 0  . nitroGLYCERIN (NITROSTAT) 0.4 MG SL tablet Place 1 tablet (0.4 mg total) under the tongue every 5 (five) minutes as needed for chest pain.  12  . ondansetron (ZOFRAN ODT) 4 MG disintegrating tablet Take 1 tablet (4 mg total) by mouth every 8 (eight) hours as needed for nausea or vomiting. 20 tablet 0  . traMADol (ULTRAM) 50 MG tablet Take 1 tablet (50 mg total) by mouth every 6 (  six) hours as needed for moderate pain or severe pain. 15 tablet 0  . zolpidem (AMBIEN CR) 6.25 MG CR tablet Take 1 tablet (6.25 mg total) by mouth at bedtime as needed for sleep. 30 tablet 4   No current facility-administered medications for this visit.    Neurologic: Headache: No Seizure: No Paresthesias:NA  Musculoskeletal: Strength & Muscle Tone: Normal Gait & Station: Normal Patient leans: N/A  Psychiatric  Specialty Exam: ROS  There were no vitals taken for this visit.There is no height or weight on file to calculate BMI.  General Appearance: Well Groomed  Eye Contact:  Good  Speech:  Clear and Coherent  Volume:  Normal  Mood:  Euthymic  Affect:  Appropriate  Thought Process:  Goal Directed  Orientation:  NA  Thought Content:  Logical  Suicidal Thoughts:  No  Homicidal Thoughts:  No  Memory:  NA  Judgement:  Good  Insight:  Good  Psychomotor Activity:  Normal  Concentration:  Concentration: Good  Recall:  Good  Fund of Knowledge:Good  Language: Negative  Akathisia:  No  Handed:  Right  AIMS (if indicated):    Assets:  Desire for Improvement  ADL's:  Intact  Cognition: WNL  Sleep:  Abnormal , problems maintaining     Treatment Plan Summary: Medication management  This patient is #1 problem is that of primary insomnia.  Her sleep is improved with Ambien 6.25 mg CR.  The patient is not sleepy during the day she is had no falls.  She speaks logically and clearly.  She is able to use language fairly well.  On my next visit in about 3 months I wish to do a cognitive assessment.  She is not clinically depressed nor she excessively anxious.  I believe she is isolated and lonely.

## 2020-04-15 NOTE — Telephone Encounter (Signed)
Tm.

## 2020-04-21 ENCOUNTER — Telehealth (HOSPITAL_COMMUNITY): Payer: Self-pay

## 2020-04-21 MED ORDER — ZOLPIDEM TARTRATE ER 6.25 MG PO TBCR: 12.5000 mg | EXTENDED_RELEASE_TABLET | Freq: Every evening | ORAL | 4 refills | Status: DC | PRN

## 2020-04-21 NOTE — Telephone Encounter (Signed)
Pt called requesting refill of ambien as she is completely out. Pt states she is taking 12.5mg  tabs each night. Requesting prescription sent to [facility] on [address]. Also requesting appointment tomorrow be on the phone instead of video as she does not have access to a computer. Please advise.

## 2020-04-21 NOTE — Telephone Encounter (Signed)
Spoke with MD. Received verbal order for 30 day supply of 12.5mg  ambien. Called into [facility]. Called patient back to make aware of prescription and telephone appointment tomorrow.

## 2020-04-22 ENCOUNTER — Telehealth (INDEPENDENT_AMBULATORY_CARE_PROVIDER_SITE_OTHER): Payer: Medicare Other | Admitting: Psychiatry

## 2020-04-22 ENCOUNTER — Other Ambulatory Visit: Payer: Self-pay

## 2020-04-22 DIAGNOSIS — F5101 Primary insomnia: Secondary | ICD-10-CM | POA: Diagnosis not present

## 2020-04-22 MED ORDER — ZOLPIDEM TARTRATE ER 6.25 MG PO TBCR: 12.5000 mg | EXTENDED_RELEASE_TABLET | Freq: Every evening | ORAL | 4 refills | Status: DC | PRN

## 2020-04-22 MED ORDER — ZOLPIDEM TARTRATE ER 6.25 MG PO TBCR: 12.5000 mg | EXTENDED_RELEASE_TABLET | Freq: Every evening | ORAL | 4 refills | Status: AC | PRN

## 2020-04-22 NOTE — Progress Notes (Signed)
Psychiatric Initial Adult Assessment   Patient Identification: Gabrielle [name] MRN:  [id number] Date of Evaluation:  04/22/2020 Referral Source: Dr. [name] Chief Complaint:   Visit Diagnosis: Adjustment disorder with depression Today the patient seems to be at her baseline.  Actually she is in touch better.  She is in the car driving with her daughter.  They now have a new geriatric primary care provider.  He saw this provider in [place].  The patient says she is sleeping well as long she takes the Ambien.  She admits to a significant problem with her memory.  They say that their primary care doctor will evaluate the memory over the next 6 months.  The patient denies any depression.  She denies any significant anxiety.  She drinks no alcohol uses no drugs.  She is never had psychosis.  I am treating her for primary insomnia.  The patient does very well on low-dose Ambien 6.25.  She had no falls.  She is not confused.  Her memory is very obviously impaired.  Without the Ambien she does not sleep and cognitively she feels much worse.  Her primary care doctor apparently is reviewed the use of Ambien and has no problems with it. PTSD Symptoms:   Past Psychiatric History: Negative  Previous Psychotropic Medications: Yes   Substance Abuse History in the last 12 months:  No.  Consequences of Substance Abuse: NA  Past Medical History:  Past Medical History:  Diagnosis Date  . Anemia    hx  . Anxiety   . Arthritis   . Azotemia   . Carotid artery disease ([place])   . Claudication ([place])   . Coronary artery disease   . CVA (cerebral vascular accident) ([place]) 2008   LOC DIZZINESS ; "mini stroke" (07/12/2018)  . Depression   . Essential hypertension, benign   . History of gout   . History of kidney stones   . Hyperlipidemia   . Migraine    "years ago; due to vision issues; got glasses & they went away" (07/12/2018)  . Neck pain   . NSTEMI (non-ST elevated myocardial infarction) ([place])  07/11/2018  . PAD (peripheral artery disease) ([facility])    hx. LCEA, hx Lt renal art. stenting, occl rt renal artery, occluded bil SFAs, moderatie iliac disease,    . Renal cell cancer ([place]) 2001   "left"  . Seizure ([place])    pt does not recall this hx on 07/12/2018  . Type II diabetes mellitus ([place]) 1990    Past Surgical History:  Procedure Laterality Date  . ABDOMINAL HYSTERECTOMY    . BACK SURGERY    . CAROTID ENDARTERECTOMY Left   . CATARACT EXTRACTION W/ INTRAOCULAR LENS  IMPLANT, BILATERAL Bilateral   . CORONARY ATHERECTOMY N/A 07/16/2018   Procedure: CORONARY ATHERECTOMY;  Surgeon: Jettie Booze, MD;  Location: [facility];  Service: Cardiovascular;  Laterality: N/A;  . CORONARY STENT INTERVENTION  07/16/2018  . CORONARY STENT INTERVENTION N/A 07/16/2018   Procedure: CORONARY STENT INTERVENTION;  Surgeon: Jettie Booze, MD;  Location: [facility];  Service: Cardiovascular;  Laterality: N/A;  . INCISION AND DRAINAGE ABSCESS Right 10/26/2017   Procedure: INCISION AND DRAINAGE HEMATOMA RIGHT SHIN;  Surgeon: Georganna Skeans, MD;  Location: [facility];  Service: General;  Laterality: Right;  . LEFT HEART CATH AND CORONARY ANGIOGRAPHY N/A 07/13/2018   Procedure: LEFT HEART CATH AND CORONARY ANGIOGRAPHY;  Surgeon: Jettie Booze, MD;  Location: [facility];  Service: Cardiovascular;  Laterality: N/A;  . LEFT HEART CATH AND CORONARY ANGIOGRAPHY N/A 07/16/2018   Procedure: LEFT HEART CATH AND CORONARY ANGIOGRAPHY;  Surgeon: Jettie Booze, MD;  Location: [facility];  Service: Cardiovascular;  Laterality: N/A;  . LUMBAR [place]    . NEPHRECTOMY Right   . NM MYOCAR PERF WALL MOTION  07/12/2011   normal  . PV angiogram  2004   Lt renal artery stent  . TEMPORARY PACEMAKER N/A 07/16/2018   Procedure: TEMPORARY PACEMAKER;  Surgeon: Jettie Booze, MD;  Location: [facility];  Service: Cardiovascular;  Laterality: N/A;  . TONSILLECTOMY     . URETERAL STENT PLACEMENT Left     Family Psychiatric History:   Family History:  Family History  Problem Relation Age of Onset  . Cancer Mother   . Heart disease Father     Social History:   Social History   Socioeconomic History  . Marital status: Widowed    Spouse name: Not on file  . Number of children: 2  . Years of education: Not on file  . Highest education level: Not on file  Occupational History  . Occupation: Retired    Fish farm manager: RETIRED  Tobacco Use  . Smoking status: Former Smoker    Packs/day: 1.00    Years: 50.00    Pack years: 50.00    Types: Cigarettes    Quit date: 12/26/2005    Years since quitting: 14.3  . Smokeless tobacco: Never Used  Substance and Sexual Activity  . Alcohol use: Never  . Drug use: Never  . Sexual activity: Not Currently  Other Topics Concern  . Not on file  Social History Narrative  . Not on file   Social Determinants of Health   Financial Resource Strain:   . Difficulty of Paying Living Expenses:   Food Insecurity:   . Worried About Charity fundraiser in the Last Year:   . Arboriculturist in the Last Year:   Transportation Needs:   . Film/video editor (Medical):   Marland Kitchen Lack of Transportation (Non-Medical):   Physical Activity:   . Days of Exercise per Week:   . Minutes of Exercise per Session:   Stress:   . Feeling of Stress :   Social Connections:   . Frequency of Communication with Friends and Family:   . Frequency of Social Gatherings with Friends and Family:   . Attends Religious Services:   . Active Member of Clubs or Organizations:   . Attends Archivist Meetings:   Marland Kitchen Marital Status:     Additional Social History:   Allergies:   Allergies  Allergen Reactions  . Codeine Nausea And Vomiting  . Fentanyl Other (See Comments)    unknown  . Lacosamide Other (See Comments)     Other name is, VIMPAT unknown  . Levetiracetam Other (See Comments)    Strange feelings in head  . Metformin  And Related Other (See Comments)    unknown  . Morphine And Related Itching  . Oxybutynin Swelling    mouth  . Sertraline Nausea Only and Other (See Comments)    Swelling in mouth  . Tessalon [Benzonatate] Other (See Comments)    unknown  . Other Rash    BETA BLOCKER    Metabolic Disorder Labs: Lab Results  Component Value Date   HGBA1C 5.6 07/14/2018   MPG 114.02 07/14/2018   MPG 128 (H) 07/30/2010   No results  found for: PROLACTIN Lab Results  Component Value Date   CHOL 186 09/25/2017   TRIG 124 09/25/2017   HDL 43 09/25/2017   CHOLHDL 4.3 09/25/2017   VLDL 25 05/03/2016   LDLCALC 118 (H) 09/25/2017   LDLCALC 59 05/03/2016     Current Medications: Current Outpatient Medications  Medication Sig Dispense Refill  . acetaminophen (TYLENOL) 325 MG tablet Take 2 tablets (650 mg total) by mouth every 6 (six) hours as needed for mild pain (or Fever >/= 101). 30 tablet 0  . acetaminophen (TYLENOL) 500 MG tablet Take 1,000 mg by mouth every 6 (six) hours as needed for mild pain or fever.     Marland Kitchen amLODipine (NORVASC) 5 MG tablet Take 10 mg by mouth daily.   3  . amoxicillin-clavulanate (AUGMENTIN) 500-125 MG tablet Take 1 tablet (500 mg total) by mouth 2 (two) times daily. 14 tablet 0  . aspirin 81 MG tablet Take 81 mg by mouth daily.    Marland Kitchen atorvastatin (LIPITOR) 40 MG tablet Take 1 tablet (40 mg total) by mouth daily at 6 PM.    . carvedilol (COREG) 3.125 MG tablet Take 1 tablet (3.125 mg total) by mouth 2 (two) times daily with a meal.    . clopidogrel (PLAVIX) 75 MG tablet Take 1 tablet (75 mg total) by mouth daily.    . diphenhydrAMINE (BENADRYL) 25 mg capsule Take 1 capsule (25 mg total) by mouth every 6 (six) hours as needed for itching, allergies or sleep (prn sleep if Ambien ineffective). 30 capsule 0  . ferrous gluconate (FERGON) 324 MG tablet Take 1 tablet (324 mg total) by mouth daily with breakfast.  3  . ferrous sulfate (SLOW FE) 160 (50 Fe) MG TBCR SR tablet Take 1  tablet (160 mg total) by mouth daily. 30 each 3  . glipiZIDE (GLUCOTROL) 5 MG tablet TAKE 1/2 TABLET BY MOUTH DAILY BEFORE BREAKFAST 15 tablet 0  . insulin aspart (NOVOLOG) 100 UNIT/ML injection Inject 0-9 Units into the skin 3 (three) times daily with meals. 10 mL 11  . lactobacillus acidophilus & bulgar (LACTINEX) chewable tablet Chew 1 tablet by mouth 3 (three) times daily with meals. 30 tablet 0  . nitroGLYCERIN (NITROSTAT) 0.4 MG SL tablet Place 1 tablet (0.4 mg total) under the tongue every 5 (five) minutes as needed for chest pain.  12  . ondansetron (ZOFRAN ODT) 4 MG disintegrating tablet Take 1 tablet (4 mg total) by mouth every 8 (eight) hours as needed for nausea or vomiting. 20 tablet 0  . traMADol (ULTRAM) 50 MG tablet Take 1 tablet (50 mg total) by mouth every 6 (six) hours as needed for moderate pain or severe pain. 15 tablet 0  . zolpidem (AMBIEN CR) 6.25 MG CR tablet Take 2 tablets (12.5 mg total) by mouth at bedtime as needed for sleep. 30 tablet 4   No current facility-administered medications for this visit.    Neurologic: Headache: No Seizure: No Paresthesias:NA  Musculoskeletal: Strength & Muscle Tone: Normal Gait & Station: Normal Patient leans: N/A  Psychiatric Specialty Exam: ROS  There were no vitals taken for this visit.There is no height or weight on file to calculate BMI.  General Appearance: Well Groomed  Eye Contact:  Good  Speech:  Clear and Coherent  Volume:  Normal  Mood:  Euthymic  Affect:  Appropriate  Thought Process:  Goal Directed  Orientation:  NA  Thought Content:  Logical  Suicidal Thoughts:  No  Homicidal Thoughts:  No  Memory:  NA  Judgement:  Good  Insight:  Good  Psychomotor Activity:  Normal  Concentration:  Concentration: Good  Recall:  Good  Fund of Knowledge:Good  Language: Negative  Akathisia:  No  Handed:  Right  AIMS (if indicated):    Assets:  Desire for Improvement  ADL's:  Intact  Cognition: WNL  Sleep:   Abnormal , problems maintaining     Treatment Plan Summary: Medication management This patient has a primary insomnia.  She takes Ambien 6.25 mg CR.  The patient has no other medical problems of significance.  She has no other significant psychiatric symptomatology.  I offered her the opportunity to get her Ambien from her primary care doctor the patient will look into this.  Nonetheless she will have a return appointment to see me in 4 months.  She is not suicidal.  She functions fairly well.  She lives with her daughter [name].

## 2020-07-15 ENCOUNTER — Telehealth: Payer: Self-pay

## 2020-07-15 NOTE — Telephone Encounter (Signed)
   [facility] Pre-operative Risk Assessment    Request for surgical clearance:  1. What type of surgery is being performed? L4-5 LUMBAR FUSIION   2. When is this surgery scheduled? TBD   3. What type of clearance is required (medical clearance vs. Pharmacy clearance to hold med vs. Both)? BOTH  4. Are there any medications that need to be held prior to surgery and how long?PT TAKES PLAVIX   5. Practice name and name of physician performing surgery? [facility] DAVID [facility]ah [facility]   ATTN:VANESSA  6. What is the office phone number? [phone]   7.   What is the office fax number? [phone]  8.   Anesthesia type (None, local, MAC, general) ? GENERAL   Waylan Rocher 07/15/2020, 2:16 PM

## 2020-07-15 NOTE — Telephone Encounter (Signed)
Primary Cardiologist:Jonathan Gwenlyn Found, MD  Chart reviewed as part of pre-operative protocol coverage. Because of Gabrielle [name]'s past medical history and time since last visit, he/she will require a follow-up visit in order to better assess preoperative cardiovascular risk.  Pre-op covering staff: - Please schedule appointment and call patient to inform them. - Please contact requesting surgeon's office via preferred method (i.e, phone, fax) to inform them of need for appointment prior to surgery.  If applicable, this message will also be routed to pharmacy pool and/or primary cardiologist for input on holding anticoagulant/antiplatelet agent as requested below so that this information is available at time of patient's appointment.   [name], NP  07/15/2020, 3:00 PM

## 2020-07-15 NOTE — Telephone Encounter (Signed)
Called pt and scheduled appt 08-19-2020 for cardiac clearance.   Pt placed on wait list for sooner appt.  Faxed to requesting party via [place] fax function

## 2020-08-18 NOTE — Progress Notes (Signed)
Cardiology Clinic Note   Patient Name: Gabrielle [name] Date of Encounter: 08/19/2020  Primary Care Provider:  [place], [place] Not In Primary Cardiologist:  Quay Burow, MD  Patient [place]. Hair 85 year old female presents to the clinic today for follow-up evaluation of her carotid artery disease, renal artery stenosis status post renal artery stenting, bilateral SFA disease, essential hypertension and preoperative cardiac evaluation.  Past Medical History    Past Medical History:  Diagnosis Date  . Anemia    hx  . Anxiety   . Arthritis   . Azotemia   . Carotid artery disease ([place])   . Claudication ([place])   . Coronary artery disease   . CVA (cerebral vascular accident) ([place]) 2008   LOC DIZZINESS ; "mini stroke" (07/12/2018)  . Depression   . Essential hypertension, benign   . History of gout   . History of kidney stones   . Hyperlipidemia   . Migraine    "years ago; due to vision issues; got glasses & they went away" (07/12/2018)  . Neck pain   . NSTEMI (non-ST elevated myocardial infarction) ([place]) 07/11/2018  . PAD (peripheral artery disease) ([facility])    hx. LCEA, hx Lt renal art. stenting, occl rt renal artery, occluded bil SFAs, moderatie iliac disease,    . Renal cell cancer ([place]) 2001   "left"  . Seizure ([place])    pt does not recall this hx on 07/12/2018  . Type II diabetes mellitus ([place]) 1990   Past Surgical History:  Procedure Laterality Date  . ABDOMINAL HYSTERECTOMY    . BACK SURGERY    . CAROTID ENDARTERECTOMY Left   . CATARACT EXTRACTION W/ INTRAOCULAR LENS  IMPLANT, BILATERAL Bilateral   . CORONARY ATHERECTOMY N/A 07/16/2018   Procedure: CORONARY ATHERECTOMY;  Surgeon: Jettie Booze, MD;  Location: [facility];  Service: Cardiovascular;  Laterality: N/A;  . CORONARY STENT INTERVENTION  07/16/2018  . CORONARY STENT INTERVENTION N/A 07/16/2018   Procedure: CORONARY STENT INTERVENTION;  Surgeon: Jettie Booze, MD;  Location:  [facility];  Service: Cardiovascular;  Laterality: N/A;  . INCISION AND DRAINAGE ABSCESS Right 10/26/2017   Procedure: INCISION AND DRAINAGE HEMATOMA RIGHT SHIN;  Surgeon: Georganna Skeans, MD;  Location: [facility];  Service: General;  Laterality: Right;  . LEFT HEART CATH AND CORONARY ANGIOGRAPHY N/A 07/13/2018   Procedure: LEFT HEART CATH AND CORONARY ANGIOGRAPHY;  Surgeon: Jettie Booze, MD;  Location: [facility];  Service: Cardiovascular;  Laterality: N/A;  . LEFT HEART CATH AND CORONARY ANGIOGRAPHY N/A 07/16/2018   Procedure: LEFT HEART CATH AND CORONARY ANGIOGRAPHY;  Surgeon: Jettie Booze, MD;  Location: [facility];  Service: Cardiovascular;  Laterality: N/A;  . LUMBAR [place]    . NEPHRECTOMY Right   . NM MYOCAR PERF WALL MOTION  07/12/2011   normal  . PV angiogram  2004   Lt renal artery stent  . TEMPORARY PACEMAKER N/A 07/16/2018   Procedure: TEMPORARY PACEMAKER;  Surgeon: Jettie Booze, MD;  Location: [facility];  Service: Cardiovascular;  Laterality: N/A;  . TONSILLECTOMY    . URETERAL STENT PLACEMENT Left     Allergies  Allergies  Allergen Reactions  . Codeine Nausea And Vomiting  . Fentanyl Other (See Comments)    unknown  . Lacosamide Other (See Comments)     Other name is, VIMPAT unknown  . Levetiracetam Other (See Comments)    Strange feelings  in head  . Metformin And Related Other (See Comments)    unknown  . Morphine And Related Itching  . Oxybutynin Swelling    mouth  . Sertraline Nausea Only and Other (See Comments)    Swelling in mouth  . Tessalon [Benzonatate] Other (See Comments)    unknown  . Other Rash    BETA BLOCKER    History of Present Illness    [name] has a PMH of PVD status post left carotid endarterectomy, renal artery stenosis status post renal artery stenting, bilateral SFA, essential hypertension, HLD, diabetes mellitus, CVA, CKD 3-4, RCC status post nephrectomy and chronic diastolic  heart failure.  She was admitted 717-07/19/2018 with NSTEMI and received DES to her LAD with orbital arthrectomy and DES to her RCA, post procedure CVA, iron deficiency anemia, deconditioning, right carotid Doppler showed 40-59%.  At that time she was discharged to [facility] skilled nursing facility.  She was last seen by Rosaria Ferries, PA-C on 07/24/2018.  During that time she denied chest discomfort, dyspnea, lower extremity edema orthopnea, and PND.  She denied palpitations, and lightheadedness.  She did state that she had not been able to increase her physical activity due to weakness.  She also stated that she had not been eating well due to decreased appetite.  This was no change from previous MI.  She denied nausea vomiting.  She stated she did have mild constipation but continued to have regular bowel movements.  She presents to the clinic today for follow-up evaluation and preoperative cardiac evaluation.  She states she feels well.  She has been limited in her physical activity due to her back pain.  She eats a heart healthy diet and is compliant with her medications.  We discussed holding her Plavix for 5 days prior to her lumbar fusion.  She expressed understanding.  She is taking both of her Materna vaccinations in January and February.  She is followed by a geriatric clinic at [facility].  I will give her the salty 6 diet sheet and have her follow-up with Dr. Gwenlyn Found himself in 1 year.  Today she denies chest pain, shortness of breath, lower extremity edema, fatigue, palpitations, melena, hematuria, hemoptysis, diaphoresis, weakness, presyncope, syncope, orthopnea, and PND.   Home Medications    Prior to Admission medications   Medication Sig Start Date End Date Taking? Authorizing Provider  acetaminophen (TYLENOL) 325 MG tablet Take 2 tablets (650 mg total) by mouth every 6 (six) hours as needed for mild pain (or Fever >/= 101). 07/26/18   Roxan Hockey, MD  acetaminophen (TYLENOL)  500 MG tablet Take 1,000 mg by mouth every 6 (six) hours as needed for mild pain or fever.     [provider]  amLODipine (NORVASC) 5 MG tablet Take 10 mg by mouth daily.  05/28/18   [provider]  amoxicillin-clavulanate (AUGMENTIN) 500-125 MG tablet Take 1 tablet (500 mg total) by mouth 2 (two) times daily. 07/26/18   Roxan Hockey, MD  aspirin 81 MG tablet Take 81 mg by mouth daily.    [provider]  atorvastatin (LIPITOR) 40 MG tablet Take 1 tablet (40 mg total) by mouth daily at 6 PM. 07/19/18   Gherghe, Vella Redhead, MD  carvedilol (COREG) 3.125 MG tablet Take 1 tablet (3.125 mg total) by mouth 2 (two) times daily with a meal. 07/19/18   Caren Griffins, MD  clopidogrel (PLAVIX) 75 MG tablet Take 1 tablet (75 mg total) by mouth daily. 07/20/18  Caren Griffins, MD  diphenhydrAMINE (BENADRYL) 25 mg capsule Take 1 capsule (25 mg total) by mouth every 6 (six) hours as needed for itching, allergies or sleep (prn sleep if Ambien ineffective). 07/26/18   Roxan Hockey, MD  ferrous gluconate (FERGON) 324 MG tablet Take 1 tablet (324 mg total) by mouth daily with breakfast. 07/19/18   Caren Griffins, MD  ferrous sulfate (SLOW FE) 160 (50 Fe) MG TBCR SR tablet Take 1 tablet (160 mg total) by mouth daily. 10/23/17   Forrest Moron, MD  glipiZIDE (GLUCOTROL) 5 MG tablet TAKE 1/2 TABLET BY MOUTH DAILY BEFORE BREAKFAST 06/21/18   [name], MD  insulin aspart (NOVOLOG) 100 UNIT/ML injection Inject 0-9 Units into the skin 3 (three) times daily with meals. 07/26/18   Roxan Hockey, MD  lactobacillus acidophilus & bulgar (LACTINEX) chewable tablet Chew 1 tablet by mouth 3 (three) times daily with meals. 07/26/18   Roxan Hockey, MD  nitroGLYCERIN (NITROSTAT) 0.4 MG SL tablet Place 1 tablet (0.4 mg total) under the tongue every 5 (five) minutes as needed for chest pain. 07/19/18   Caren Griffins, MD  ondansetron (ZOFRAN ODT) 4 MG disintegrating tablet Take 1 tablet (4  mg total) by mouth every 8 (eight) hours as needed for nausea or vomiting. 07/26/18   Roxan Hockey, MD  traMADol (ULTRAM) 50 MG tablet Take 1 tablet (50 mg total) by mouth every 6 (six) hours as needed for moderate pain or severe pain. 07/26/18   Roxan Hockey, MD  zolpidem (AMBIEN CR) 6.25 MG CR tablet Take 2 tablets (12.5 mg total) by mouth at bedtime as needed for sleep. 04/22/20 04/22/21  Norma Fredrickson, MD    Family History    Family History  Problem Relation Age of Onset  . Cancer Mother   . Heart disease Father    She indicated that her mother is deceased. She indicated that her father is deceased. She indicated that her daughter is alive. She indicated that her son is alive.  Social History    Social History   Socioeconomic History  . Marital status: Widowed    Spouse name: Not on file  . Number of children: 2  . Years of education: Not on file  . Highest education level: Not on file  Occupational History  . Occupation: Retired    Fish farm manager: RETIRED  Tobacco Use  . Smoking status: Former Smoker    Packs/day: 1.00    Years: 50.00    Pack years: 50.00    Types: Cigarettes    Quit date: 12/26/2005    Years since quitting: 14.6  . Smokeless tobacco: Never Used  Vaping Use  . Vaping Use: Never used  Substance and Sexual Activity  . Alcohol use: Never  . Drug use: Never  . Sexual activity: Not Currently  Other Topics Concern  . Not on file  Social History Narrative  . Not on file   Social Determinants of Health   Financial Resource Strain:   . Difficulty of Paying Living Expenses: Not on file  Food Insecurity:   . Worried About Charity fundraiser in the Last Year: Not on file  . Ran Out of Food in the Last Year: Not on file  Transportation Needs:   . Lack of Transportation (Medical): Not on file  . Lack of Transportation (Non-Medical): Not on file  Physical Activity:   . Days of Exercise per Week: Not on file  . Minutes of Exercise per Session: Not  on file   Stress:   . Feeling of Stress : Not on file  Social Connections:   . Frequency of Communication with Friends and Family: Not on file  . Frequency of Social Gatherings with Friends and Family: Not on file  . Attends Religious Services: Not on file  . Active Member of Clubs or Organizations: Not on file  . Attends Archivist Meetings: Not on file  . Marital Status: Not on file  Intimate Partner Violence:   . Fear of Current or Ex-Partner: Not on file  . Emotionally Abused: Not on file  . Physically Abused: Not on file  . Sexually Abused: Not on file     Review of Systems    General:  No chills, fever, night sweats or weight changes.  Cardiovascular:  No chest pain, dyspnea on exertion, edema, orthopnea, palpitations, paroxysmal nocturnal dyspnea. Dermatological: No rash, lesions/masses Respiratory: No cough, dyspnea Urologic: No hematuria, dysuria Abdominal:   No nausea, vomiting, diarrhea, bright red blood per rectum, melena, or hematemesis Neurologic:  No visual changes, wkns, changes in mental status. All other systems reviewed and are otherwise negative except as noted above.  Physical Exam    VS:  BP (!) 96/50   Pulse 76   Temp (!) 97 F (36.1 C)   Ht 5\' 1"  (1.549 m)   Wt 119 lb (54 kg)   BMI 22.48 kg/m  , BMI Body mass index is 22.48 kg/m. GEN: Well nourished, well developed, in no acute distress. HEENT: normal. Neck: Supple, no JVD, carotid bruits, or masses. Cardiac: RRR, no murmurs, rubs, or gallops. No clubbing, cyanosis, edema.  Radials/DP/PT 2+ and equal bilaterally.  Respiratory:  Respirations regular and unlabored, clear to auscultation bilaterally. GI: Soft, nontender, nondistended, BS + x 4. MS: no deformity or atrophy. Skin: warm and dry, no rash. Neuro:  Strength and sensation are intact. Psych: Normal affect.  Accessory Clinical Findings    Recent Labs: No results found for requested labs within last 8760 hours.   Recent Lipid Panel     Component Value Date/Time   CHOL 186 09/25/2017 1444   TRIG 124 09/25/2017 1444   HDL 43 09/25/2017 1444   CHOLHDL 4.3 09/25/2017 1444   CHOLHDL 2.4 05/03/2016 1124   VLDL 25 05/03/2016 1124   LDLCALC 118 (H) 09/25/2017 1444    ECG personally reviewed by me today-normal sinus rhythm nonspecific T wave abnormality 76 bpm- No acute changes  EKG 07/25/2018 Sinus rhythm 68 bpm no ST or T wave deviation.  Echocardiogram 07/11/2018 Study Conclusions   - Left ventricle: The cavity size was normal. There was moderate  concentric hypertrophy. Systolic function was normal. The  estimated ejection fraction was in the range of 55% to 60%. Wall  motion was normal; there were no regional wall motion  abnormalities. Doppler parameters are consistent with abnormal  left ventricular relaxation (grade 1 diastolic dysfunction).  Doppler parameters are consistent with high ventricular filling  pressure.  - Aortic valve: Transvalvular velocity was within the normal range.  There was no stenosis. There was trivial regurgitation.  - Mitral valve: Calcified annulus. Transvalvular velocity was  within the normal range. There was no evidence for stenosis.  There was mild regurgitation.  - Right ventricle: The cavity size was normal. Wall thickness was  normal. Systolic function was normal.  - Atrial septum: No defect or patent foramen ovale was identified  by color flow Doppler.  - Tricuspid valve: There was trivial regurgitation.  -  Pulmonary arteries: Systolic pressure was within the normal  range. PA peak pressure: 29 mm Hg (S).  Cardiac catheterization 07/16/2018  Dist LM lesion is 25% stenosed.  Ost LAD lesion is 50% stenosed.  Prox LAD lesion is 75% stenosed.  A drug-eluting stent was successfully placed using a STENT SIERRA 2.75 X 18 MM.  Ost 1st Diag lesion is 99% stenosed. THis was jailed by the stent.  Post intervention, there is a 0% residual  stenosis.  Prox RCA lesion is 99% stenosed.  A drug-eluting stent was successfully placed after orbital atherectomy, using a STENT SIERRA 3.00 X 18 MM.  Post intervention, there is a 0% residual stenosis.  LV end diastolic pressure is normal. LVEDP 10 mm Hg.  There is no aortic valve stenosis.     Recommend uninterrupted dual antiplatelet therapy with Aspirin 81mg  daily and Clopidogrel 75mg  daily for a minimum of 12 months (ACS - Class I recommendation).   Continue aggressive secondary prevention.   Diagnostic Dominance: Right  Intervention     Assessment & Plan   1.  Status post NSTEMI-no chest pain today.  Cardiac catheterization 7/19 with arthrectomy to RCA and DES as well as DES to mid LAD.  Continues to increase her physical activity. Continue amlodipine, aspirin, atorvastatin, carvedilol, Plavix, nitroglycerin Heart healthy low-sodium diet-salty 6 given Increase physical activity as tolerated  Essential hypertension-BP today 96/50.  Well-controlled at home. Continue amlodipine, carvedilol Heart healthy low-sodium diet-salty 6 given Increase physical activity as tolerated  CKD stage III-IV-creatinine 2.82 on 04/21/20. [facility] geriatric clinic following   Preoperative cardiac evaluation-L4-5 lumbar fusion [facility] Dr. Sherley Bounds fax [phone]     Primary Cardiologist: Quay Burow, MD  Chart reviewed as part of pre-operative protocol coverage. Given past medical history and time since last visit, based on ACC/AHA guidelines, MYONNA CHISOM would be at acceptable risk for the planned procedure without further cardiovascular testing.   Her RCRI is a class IV risk, 11% risk of major cardiac event.  She may hold her Plavix for 5 days prior to procedure.  Please resume as soon as hemostasis is achieved.  I will route this recommendation to the requesting party via Epic fax function and remove from pre-op pool.    Please call with  questions.   Disposition: Follow-up with Dr. Gwenlyn Found or myself in 1 year.  Jossie Ng. Spike Desilets NP-C    08/19/2020, 3:56 PM [address] Office [phone] Fax [phone]  Notice: This dictation was prepared with Dragon dictation along with smaller phrase technology. Any transcriptional errors that result from this process are unintentional and may not be corrected upon review.

## 2020-08-19 ENCOUNTER — Encounter: Payer: Self-pay | Admitting: General Practice

## 2020-08-19 ENCOUNTER — Ambulatory Visit (INDEPENDENT_AMBULATORY_CARE_PROVIDER_SITE_OTHER): Payer: Medicare Other | Admitting: General Practice

## 2020-08-19 ENCOUNTER — Other Ambulatory Visit: Payer: Self-pay

## 2020-08-19 VITALS — BP 96/50 | HR 76 | Temp 97.0°F | Ht 61.0 in | Wt 119.0 lb

## 2020-08-19 DIAGNOSIS — I214 Non-ST elevation (NSTEMI) myocardial infarction: Secondary | ICD-10-CM | POA: Diagnosis not present

## 2020-08-19 DIAGNOSIS — N183 Chronic kidney disease, stage 3 unspecified: Secondary | ICD-10-CM | POA: Diagnosis not present

## 2020-08-19 DIAGNOSIS — Z01810 Encounter for preprocedural cardiovascular examination: Secondary | ICD-10-CM

## 2020-08-19 DIAGNOSIS — I10 Essential (primary) hypertension: Secondary | ICD-10-CM

## 2020-08-19 NOTE — Patient Instructions (Signed)
Medication Instructions:  OK TO HOLD PLAVIX 5 DAYS PRIOR SURGERY AND RESTART ASAP AFTER, PER SURGEON'S DIRECTION *If you need a refill on your cardiac medications before your next appointment, please call your pharmacy*  Special Instructions CALL WHEN YOU GET HOME AND LET us KNOW WHICH SLEEP MEDICATION YOU ARE TAKING  PLEASE READ AND FOLLOW SALTY 6-ATTACHED  PLEASE INCREASE PHYSICAL ACTIVITY AS TOLERATED  CLEARED FOR SURGERY  Follow-Up: Your next appointment:  12 month(s) Please call our office 2 months in advance to schedule this appointment In Person with You may see Quay Burow, MD or one of the following Advanced Practice Providers on your designated Care Team:  Coletta Memos, [place], PA-C  Sande Rives, PA-C  At [facility], you and your health needs are our priority.  As part of our continuing mission to provide you with exceptional heart care, we have created designated Provider Care Teams.  These Care Teams include your primary Cardiologist (physician) and Advanced Practice Providers (APPs -  Physician Assistants and Nurse Practitioners) who all work together to provide you with the care you need, when you need it.  We recommend signing up for the patient portal called "MyChart".  Sign up information is provided on this After Visit Summary.  MyChart is used to connect with patients for Virtual Visits (Telemedicine).  Patients are able to view lab/test results, encounter notes, upcoming appointments, etc.  Non-urgent messages can be sent to your provider as well.   To learn more about what you can do with MyChart, go to [email or web address].

## 2020-08-26 ENCOUNTER — Ambulatory Visit (INDEPENDENT_AMBULATORY_CARE_PROVIDER_SITE_OTHER): Payer: Medicare Other | Admitting: Psychiatry

## 2020-08-26 ENCOUNTER — Other Ambulatory Visit: Payer: Self-pay

## 2020-08-26 DIAGNOSIS — F5101 Primary insomnia: Secondary | ICD-10-CM

## 2020-08-26 MED ORDER — HYDROXYZINE PAMOATE 25 MG PO CAPS: ORAL_CAPSULE | ORAL | 5 refills | Status: AC

## 2020-08-26 NOTE — Progress Notes (Signed)
Psychiatric Initial Adult Assessment   Patient Identification: Gabrielle [name] MRN:  [id number] Date of Evaluation:  08/26/2020 Referral Source: Dr. [name] Chief Complaint:  Visit Diagnosis. PTSD Symptoms:   Today patient is doing well.  She ran out of her Ambien which is fine.  She has been taking some melatonin with minimal benefits.  She is a difficulty going to sleep.  She is not depressed.  She denies use of drugs or alcohol.  The patient does feel isolated.  She has limited social contacts.  Her health is fairly good.  She has been having some GI issues and she recently saw someone this morning and got some lab.  For the most part she is stable.  She is at her baseline.   Past Psychiatric History: Negative  Previous Psychotropic Medications: Yes   Substance Abuse History in the last 12 months:  No.  Consequences of Substance Abuse: NA  Past Medical History:  Past Medical History:  Diagnosis Date  . Anemia    hx  . Anxiety   . Arthritis   . Azotemia   . Carotid artery disease ([place])   . Claudication ([place])   . Coronary artery disease   . CVA (cerebral vascular accident) ([place]) 2008   LOC DIZZINESS ; "mini stroke" (07/12/2018)  . Depression   . Essential hypertension, benign   . History of gout   . History of kidney stones   . Hyperlipidemia   . Migraine    "years ago; due to vision issues; got glasses & they went away" (07/12/2018)  . Neck pain   . NSTEMI (non-ST elevated myocardial infarction) ([place]) 07/11/2018  . PAD (peripheral artery disease) ([facility])    hx. LCEA, hx Lt renal art. stenting, occl rt renal artery, occluded bil SFAs, moderatie iliac disease,    . Renal cell cancer ([place]) 2001   "left"  . Seizure ([place])    pt does not recall this hx on 07/12/2018  . Type II diabetes mellitus ([place]) 1990    Past Surgical History:  Procedure Laterality Date  . ABDOMINAL HYSTERECTOMY    . BACK SURGERY    . CAROTID ENDARTERECTOMY Left   . CATARACT EXTRACTION W/ INTRAOCULAR  LENS  IMPLANT, BILATERAL Bilateral   . CORONARY ATHERECTOMY N/A 07/16/2018   Procedure: CORONARY ATHERECTOMY;  Surgeon: Jettie Booze, MD;  Location: [facility];  Service: Cardiovascular;  Laterality: N/A;  . CORONARY STENT INTERVENTION  07/16/2018  . CORONARY STENT INTERVENTION N/A 07/16/2018   Procedure: CORONARY STENT INTERVENTION;  Surgeon: Jettie Booze, MD;  Location: [facility];  Service: Cardiovascular;  Laterality: N/A;  . INCISION AND DRAINAGE ABSCESS Right 10/26/2017   Procedure: INCISION AND DRAINAGE HEMATOMA RIGHT SHIN;  Surgeon: Georganna Skeans, MD;  Location: [facility];  Service: General;  Laterality: Right;  . LEFT HEART CATH AND CORONARY ANGIOGRAPHY N/A 07/13/2018   Procedure: LEFT HEART CATH AND CORONARY ANGIOGRAPHY;  Surgeon: Jettie Booze, MD;  Location: [facility];  Service: Cardiovascular;  Laterality: N/A;  . LEFT HEART CATH AND CORONARY ANGIOGRAPHY N/A 07/16/2018   Procedure: LEFT HEART CATH AND CORONARY ANGIOGRAPHY;  Surgeon: Jettie Booze, MD;  Location: [facility];  Service: Cardiovascular;  Laterality: N/A;  . LUMBAR [place]    . NEPHRECTOMY Right   . NM MYOCAR PERF WALL MOTION  07/12/2011   normal  . PV angiogram  2004   Lt renal artery stent  . TEMPORARY PACEMAKER N/A 07/16/2018  Procedure: TEMPORARY PACEMAKER;  Surgeon: Jettie Booze, MD;  Location: [facility];  Service: Cardiovascular;  Laterality: N/A;  . TONSILLECTOMY    . URETERAL STENT PLACEMENT Left     Family Psychiatric History:   Family History:  Family History  Problem Relation Age of Onset  . Cancer Mother   . Heart disease Father     Social History:   Social History   Socioeconomic History  . Marital status: Widowed    Spouse name: Not on file  . Number of children: 2  . Years of education: Not on file  . Highest education level: Not on file  Occupational History  . Occupation: Retired    Fish farm manager: RETIRED   Tobacco Use  . Smoking status: Former Smoker    Packs/day: 1.00    Years: 50.00    Pack years: 50.00    Types: Cigarettes    Quit date: 12/26/2005    Years since quitting: 14.6  . Smokeless tobacco: Never Used  Vaping Use  . Vaping Use: Never used  Substance and Sexual Activity  . Alcohol use: Never  . Drug use: Never  . Sexual activity: Not Currently  Other Topics Concern  . Not on file  Social History Narrative  . Not on file   Social Determinants of Health   Financial Resource Strain:   . Difficulty of Paying Living Expenses: Not on file  Food Insecurity:   . Worried About Charity fundraiser in the Last Year: Not on file  . Ran Out of Food in the Last Year: Not on file  Transportation Needs:   . Lack of Transportation (Medical): Not on file  . Lack of Transportation (Non-Medical): Not on file  Physical Activity:   . Days of Exercise per Week: Not on file  . Minutes of Exercise per Session: Not on file  Stress:   . Feeling of Stress : Not on file  Social Connections:   . Frequency of Communication with Friends and Family: Not on file  . Frequency of Social Gatherings with Friends and Family: Not on file  . Attends Religious Services: Not on file  . Active Member of Clubs or Organizations: Not on file  . Attends Archivist Meetings: Not on file  . Marital Status: Not on file    Additional Social History:   Allergies:   Allergies  Allergen Reactions  . Codeine Nausea And Vomiting  . Fentanyl Other (See Comments)    unknown  . Lacosamide Other (See Comments)     Other name is, VIMPAT unknown  . Levetiracetam Other (See Comments)    Strange feelings in head  . Metformin And Related Other (See Comments)    unknown  . Morphine And Related Itching  . Oxybutynin Swelling    mouth  . Sertraline Nausea Only and Other (See Comments)    Swelling in mouth  . Tessalon [Benzonatate] Other (See Comments)    unknown  . Other Rash    BETA BLOCKER     Metabolic Disorder Labs: Lab Results  Component Value Date   HGBA1C 5.6 07/14/2018   MPG 114.02 07/14/2018   MPG 128 (H) 07/30/2010   No results found for: PROLACTIN Lab Results  Component Value Date   CHOL 186 09/25/2017   TRIG 124 09/25/2017   HDL 43 09/25/2017   CHOLHDL 4.3 09/25/2017   VLDL 25 05/03/2016   LDLCALC 118 (H) 09/25/2017   LDLCALC 59 05/03/2016  Current Medications: Current Outpatient Medications  Medication Sig Dispense Refill  . acetaminophen (TYLENOL) 325 MG tablet Take 2 tablets (650 mg total) by mouth every 6 (six) hours as needed for mild pain (or Fever >/= 101). 30 tablet 0  . acetaminophen (TYLENOL) 500 MG tablet Take 1,000 mg by mouth every 6 (six) hours as needed for mild pain or fever.     Marland Kitchen amLODipine (NORVASC) 5 MG tablet Take 10 mg by mouth daily.   3  . amoxicillin-clavulanate (AUGMENTIN) 500-125 MG tablet Take 1 tablet (500 mg total) by mouth 2 (two) times daily. 14 tablet 0  . aspirin 81 MG tablet Take 81 mg by mouth daily.    Marland Kitchen atorvastatin (LIPITOR) 40 MG tablet Take 1 tablet (40 mg total) by mouth daily at 6 PM.    . carvedilol (COREG) 3.125 MG tablet Take 1 tablet (3.125 mg total) by mouth 2 (two) times daily with a meal.    . clopidogrel (PLAVIX) 75 MG tablet Take 1 tablet (75 mg total) by mouth daily.    . diphenhydrAMINE (BENADRYL) 25 mg capsule Take 1 capsule (25 mg total) by mouth every 6 (six) hours as needed for itching, allergies or sleep (prn sleep if Ambien ineffective). 30 capsule 0  . ferrous gluconate (FERGON) 324 MG tablet Take 1 tablet (324 mg total) by mouth daily with breakfast.  3  . ferrous sulfate (SLOW FE) 160 (50 Fe) MG TBCR SR tablet Take 1 tablet (160 mg total) by mouth daily. 30 each 3  . glipiZIDE (GLUCOTROL) 5 MG tablet TAKE 1/2 TABLET BY MOUTH DAILY BEFORE BREAKFAST 15 tablet 0  . hydrOXYzine (VISTARIL) 25 MG capsule 1  qhs 30 capsule 5  . insulin aspart (NOVOLOG) 100 UNIT/ML injection Inject 0-9 Units into  the skin 3 (three) times daily with meals. 10 mL 11  . lactobacillus acidophilus & bulgar (LACTINEX) chewable tablet Chew 1 tablet by mouth 3 (three) times daily with meals. 30 tablet 0  . nitroGLYCERIN (NITROSTAT) 0.4 MG SL tablet Place 1 tablet (0.4 mg total) under the tongue every 5 (five) minutes as needed for chest pain.  12  . ondansetron (ZOFRAN ODT) 4 MG disintegrating tablet Take 1 tablet (4 mg total) by mouth every 8 (eight) hours as needed for nausea or vomiting. 20 tablet 0  . traMADol (ULTRAM) 50 MG tablet Take 1 tablet (50 mg total) by mouth every 6 (six) hours as needed for moderate pain or severe pain. 15 tablet 0  . zolpidem (AMBIEN CR) 6.25 MG CR tablet Take 2 tablets (12.5 mg total) by mouth at bedtime as needed for sleep. 30 tablet 4   No current facility-administered medications for this visit.    Neurologic: Headache: No Seizure: No Paresthesias:NA  Musculoskeletal: Strength & Muscle Tone: Normal Gait & Station: Normal Patient leans: N/A  Psychiatric Specialty Exam: ROS  There were no vitals taken for this visit.There is no height or weight on file to calculate BMI.  General Appearance: Well Groomed  Eye Contact:  Good  Speech:  Clear and Coherent  Volume:  Normal  Mood:  Euthymic  Affect:  Appropriate  Thought Process:  Goal Directed  Orientation:  NA  Thought Content:  Logical  Suicidal Thoughts:  No  Homicidal Thoughts:  No  Memory:  NA  Judgement:  Good  Insight:  Good  Psychomotor Activity:  Normal  Concentration:  Concentration: Good  Recall:  Good  Fund of Knowledge:Good  Language: Negative  Akathisia:  No  Handed:  Right  AIMS (if indicated):    Assets:  Desire for Improvement  ADL's:  Intact  Cognition: WNL  Sleep:  Abnormal , problems maintaining     Treatment Plan Summary: Medication management  At this time the patient will begin on Vistaril 25 mg nightly.  She received an of medication the last couple of 4 to 5 months which she  will return to see me.  She is functioning reasonably well under the care of her daughter.

## 2020-10-26 DEATH — deceased

## 2021-01-27 ENCOUNTER — Telehealth (HOSPITAL_COMMUNITY): Payer: Medicare Other | Admitting: Psychiatry
